# Patient Record
Sex: Male | Born: 1945
Health system: Southern US, Community
[De-identification: ages and names within clinical notes are randomized; demographics above are authoritative.]

## PROBLEM LIST (undated history)

## (undated) DIAGNOSIS — I350 Nonrheumatic aortic (valve) stenosis: Secondary | ICD-10-CM

## (undated) DIAGNOSIS — I639 Cerebral infarction, unspecified: Secondary | ICD-10-CM

## (undated) DIAGNOSIS — K4090 Unilateral inguinal hernia, without obstruction or gangrene, not specified as recurrent: Secondary | ICD-10-CM

## (undated) DIAGNOSIS — I4821 Permanent atrial fibrillation: Secondary | ICD-10-CM

## (undated) DIAGNOSIS — K5730 Diverticulosis of large intestine without perforation or abscess without bleeding: Secondary | ICD-10-CM

## (undated) DIAGNOSIS — I6529 Occlusion and stenosis of unspecified carotid artery: Secondary | ICD-10-CM

## (undated) DIAGNOSIS — E785 Hyperlipidemia, unspecified: Secondary | ICD-10-CM

## (undated) DIAGNOSIS — C61 Malignant neoplasm of prostate: Secondary | ICD-10-CM

## (undated) DIAGNOSIS — F17200 Nicotine dependence, unspecified, uncomplicated: Secondary | ICD-10-CM

## (undated) DIAGNOSIS — I739 Peripheral vascular disease, unspecified: Secondary | ICD-10-CM

## (undated) DIAGNOSIS — C4490 Unspecified malignant neoplasm of skin, unspecified: Secondary | ICD-10-CM

## (undated) DIAGNOSIS — R972 Elevated prostate specific antigen [PSA]: Secondary | ICD-10-CM

## (undated) DIAGNOSIS — I10 Essential (primary) hypertension: Secondary | ICD-10-CM

## (undated) DIAGNOSIS — J189 Pneumonia, unspecified organism: Secondary | ICD-10-CM

## (undated) HISTORY — DX: Occlusion and stenosis of unspecified carotid artery: I65.29

## (undated) HISTORY — DX: Essential (primary) hypertension: I10

## (undated) HISTORY — DX: Unspecified malignant neoplasm of skin, unspecified: C44.90

## (undated) HISTORY — DX: Permanent atrial fibrillation: I48.21

## (undated) HISTORY — DX: Diverticulosis of large intestine without perforation or abscess without bleeding: K57.30

## (undated) HISTORY — PX: TONSILLECTOMY AND ADENOIDECTOMY: SUR1326

## (undated) HISTORY — DX: Unilateral inguinal hernia, without obstruction or gangrene, not specified as recurrent: K40.90

## (undated) HISTORY — DX: Elevated prostate specific antigen (PSA): R97.20

## (undated) HISTORY — DX: Nonrheumatic aortic (valve) stenosis: I35.0

## (undated) HISTORY — DX: Nicotine dependence, unspecified, uncomplicated: F17.200

## (undated) HISTORY — DX: Hyperlipidemia, unspecified: E78.5

## (undated) HISTORY — DX: Malignant neoplasm of prostate: C61

## (undated) HISTORY — DX: Peripheral vascular disease, unspecified: I73.9

---

## 2004-06-22 ENCOUNTER — Ambulatory Visit: Payer: Self-pay | Admitting: Internal Medicine

## 2004-07-20 ENCOUNTER — Ambulatory Visit: Payer: Self-pay | Admitting: Internal Medicine

## 2004-08-16 ENCOUNTER — Ambulatory Visit: Payer: Self-pay | Admitting: Internal Medicine

## 2004-09-14 ENCOUNTER — Ambulatory Visit: Payer: Self-pay | Admitting: Internal Medicine

## 2004-10-13 ENCOUNTER — Ambulatory Visit: Payer: Self-pay | Admitting: Internal Medicine

## 2004-11-09 ENCOUNTER — Ambulatory Visit: Payer: Self-pay | Admitting: Internal Medicine

## 2004-12-08 ENCOUNTER — Ambulatory Visit: Payer: Self-pay | Admitting: Internal Medicine

## 2005-01-05 ENCOUNTER — Ambulatory Visit: Payer: Self-pay | Admitting: Internal Medicine

## 2005-02-03 ENCOUNTER — Ambulatory Visit: Payer: Self-pay | Admitting: Internal Medicine

## 2005-03-04 ENCOUNTER — Ambulatory Visit: Payer: Self-pay | Admitting: Internal Medicine

## 2005-03-31 ENCOUNTER — Ambulatory Visit: Payer: Self-pay | Admitting: Internal Medicine

## 2005-04-29 ENCOUNTER — Ambulatory Visit (HOSPITAL_COMMUNITY): Admission: RE | Admit: 2005-04-29 | Discharge: 2005-04-29 | Payer: Self-pay | Admitting: Cardiology

## 2005-05-06 ENCOUNTER — Ambulatory Visit: Payer: Self-pay | Admitting: Internal Medicine

## 2005-06-02 ENCOUNTER — Ambulatory Visit: Payer: Self-pay | Admitting: Internal Medicine

## 2005-06-24 ENCOUNTER — Ambulatory Visit (HOSPITAL_COMMUNITY): Admission: RE | Admit: 2005-06-24 | Discharge: 2005-06-24 | Payer: Self-pay | Admitting: Cardiology

## 2005-07-01 ENCOUNTER — Ambulatory Visit: Payer: Self-pay | Admitting: Internal Medicine

## 2005-07-29 ENCOUNTER — Ambulatory Visit: Payer: Self-pay | Admitting: Internal Medicine

## 2005-08-05 ENCOUNTER — Ambulatory Visit (HOSPITAL_COMMUNITY): Admission: RE | Admit: 2005-08-05 | Discharge: 2005-08-05 | Payer: Self-pay | Admitting: Cardiology

## 2005-08-05 HISTORY — PX: CARDIOVERSION: SHX1299

## 2005-08-05 HISTORY — PX: OTHER SURGICAL HISTORY: SHX169

## 2005-08-26 ENCOUNTER — Ambulatory Visit: Payer: Self-pay | Admitting: Internal Medicine

## 2005-09-23 ENCOUNTER — Ambulatory Visit: Payer: Self-pay | Admitting: Internal Medicine

## 2005-10-21 ENCOUNTER — Ambulatory Visit: Payer: Self-pay | Admitting: Internal Medicine

## 2005-11-18 ENCOUNTER — Ambulatory Visit: Payer: Self-pay | Admitting: Internal Medicine

## 2005-12-15 ENCOUNTER — Ambulatory Visit: Payer: Self-pay | Admitting: Internal Medicine

## 2006-01-12 ENCOUNTER — Ambulatory Visit: Payer: Self-pay | Admitting: Internal Medicine

## 2006-02-09 ENCOUNTER — Ambulatory Visit: Payer: Self-pay | Admitting: Internal Medicine

## 2006-03-17 ENCOUNTER — Ambulatory Visit: Payer: Self-pay | Admitting: Internal Medicine

## 2006-05-12 ENCOUNTER — Ambulatory Visit: Payer: Self-pay | Admitting: Internal Medicine

## 2006-06-08 ENCOUNTER — Ambulatory Visit: Payer: Self-pay | Admitting: Internal Medicine

## 2006-07-06 ENCOUNTER — Ambulatory Visit: Payer: Self-pay | Admitting: Internal Medicine

## 2006-08-03 ENCOUNTER — Ambulatory Visit: Payer: Self-pay | Admitting: Internal Medicine

## 2006-08-31 ENCOUNTER — Ambulatory Visit: Payer: Self-pay | Admitting: Internal Medicine

## 2006-10-06 ENCOUNTER — Ambulatory Visit: Payer: Self-pay | Admitting: Internal Medicine

## 2006-12-07 ENCOUNTER — Ambulatory Visit: Payer: Self-pay | Admitting: Internal Medicine

## 2007-01-04 ENCOUNTER — Ambulatory Visit: Payer: Self-pay | Admitting: Internal Medicine

## 2007-02-01 ENCOUNTER — Ambulatory Visit: Payer: Self-pay | Admitting: Internal Medicine

## 2008-05-30 DIAGNOSIS — K4090 Unilateral inguinal hernia, without obstruction or gangrene, not specified as recurrent: Secondary | ICD-10-CM

## 2008-05-30 HISTORY — PX: COLONOSCOPY: SHX174

## 2008-05-30 HISTORY — DX: Unilateral inguinal hernia, without obstruction or gangrene, not specified as recurrent: K40.90

## 2010-10-12 NOTE — Assessment & Plan Note (Signed)
[facility]                             PULMONARY OFFICE NOTE   AZEEZ, DUNKER                       MRN:          [id number]  DATE:02/01/2007                            DOB:          [birth year]    PROBLEM:  This gentleman had previously been followed at the old office  for a long-standing history of insect venom hypersensitivity.  He is a  former smoker who has been on insect venom desensitization now for many  years with no problems.  [place] has had no stings  in at least five years.  He does mow lawns.  He does not routinely  recognize asthma or seasonal rhinitis complaints.  He is still smoking  now, 5-6 cigarettes a day, and has been treated for atrial fibrillation.  If he is actively working outdoors in a field he may develop some nasal  congestion and realizes he would do better if he just wore a mask.   MEDICATIONS:  1. Hymenoptera vaccination.  2. Warfarin.  3. Cartia XT 300 mg.   DRUG INTOLERANCE:  FURADANTIN.   OBJECTIVE:  Weight 182 pounds, blood pressure 140/60, pulse 82, room air  saturation 92%.  HEART:  Heart sounds today are regular to my exam without murmur or  gallop.  LUNGS:  Lung fields are clear.  There is odor of tobacco.  No  significant nasal congestion.  No cough.   IMPRESSION:  1. Insect hypersensitivity with no recent problems.  He has been on      venom vaccination a long time and it is appropriate now as      discussed carefully with him, to let him stop.  2. Minimal allergic rhinitis.  3. Tobacco abuse.  4. Atrial fibrillation, currently either very well-controlled or in      sinus rhythm.   PLAN:  1. Epi-Pen with discussion.  2. We are stopping his venom vaccine.  3. Smoking cessation.  4. Schedule return here p.r.n.     Clinton D. Maple Hudson, MD, Tonny Bollman, FACP  Electronically Signed   CDY/MedQ  DD: 02/04/2007  DT: 02/05/2007  Job #: [id number]   cc:   Candyce Churn, M.D.  Armanda Magic, M.D.

## 2010-10-15 NOTE — Cardiovascular Report (Signed)
NAME:  Kyle Kelley, BURBANO NO.:  [id number]   MEDICAL RECORD NO.:  [id number]          PATIENT TYPE:  OIB   LOCATION:  2899                         FACILITY:  [facility]   PHYSICIAN:  Armanda Magic, M.D.     DATE OF BIRTH:  [birth year]   DATE OF PROCEDURE:  DATE OF DISCHARGE:                              CARDIAC CATHETERIZATION   REFERRING PHYSICIAN:  Candyce Churn, M.D.   PROCEDURES:  1.  Left heart catheterization.  2.  Coronary angiography.  3.  Left ventriculography.   OPERATOR:  Armanda Magic, M.D.   INDICATIONS:  Atrial fibrillation, abnormal Cardiolite.   COMPLICATIONS:  None.   IV access via the right femoral artery, 6-French sheath.   IV contrast a 110 cc.   This is a 65 year old white male with a history of atrial fibrillation in  the past who had converted back to a sinus rhythm but now was found to be  back in Afib.  He underwent stress Cardiolite study to rule out inducible  ischemia which showed a partially reversible inferior defect and now  presents for cardiac catheterization.   The patient was brought to the cardiac catheterization laboratory in a  fasting non-sedated state.  Informed consent was obtained.  The patient was  connected to continuous heart rate and pulse oximetry monitoring and blood  pressure monitoring.  The right groin was prepped and draped in sterile  fashion.  Xylocaine 1% was used for local anesthesia. Using modified  Seldinger technique, a 6-French sheath was placed in the right femoral  artery.  Under fluoroscopic guidance, a 6-French JL-4 catheter was placed in  the left coronary artery.  Multiple cine films were taken at 30 degree RAO,  40 degree LAO views.  This catheter was then exchanged out for a 6-French JR-  4 catheter which could not successfully engage the right coronary ostium.  The catheter was exchanged out over a guidewire for 6-French no torque right  coronary artery catheter which again could not  successfully engage the right  coronary ostium.  The catheter was exchanged over guidewire for a 6-French  Williams right catheter which successfully engaged the right coronary  ostium. Multiple cine films were taken at 30 degree RAO, 40 degree LAO  views.  This catheter was exchanged over a guidewire for 6-French angled  pigtail catheter which was placed under fluoroscopic guidance in the left  ventricular cavity.  Left ventriculography was performed in 30 degree RAO  view, using a total of 30 cc of contrast at 15 cc/sec.  The catheter was  then pulled back across the aortic valve with no significant gradient noted.  At the end of procedure, all catheters and sheaths were removed.  Manual  compression was performed till adequate hemostasis was obtained.  The  patient transferred back to the room stable condition.   RESULTS:  1.  The left main coronary artery is widely patent and bifurcates in the      left anterior descending artery and left circumflex artery.  The left      anterior descending artery is widely patent  throughout its course.  The      apex giving rise to one diagonal branch.  The first diagonal is widely      patent throughout its course, bifurcating distally into two daughter      branches.  2.  The left circumflex is widely patent throughout its course giving rise      to a first obtuse marginal branch which is widely patent.  It then gives      rise to a second obtuse marginal branch which bifurcates into two      daughter branches both of which are widely patent.  The ongoing      circumflex is widely patent.  3.  The right coronary artery is widely patent throughout its course,      bifurcating distally at a posterior descending artery and posterolateral      artery.   LEFT VENTRICULOGRAPHY:  1.  Shows normal LV systolic function, EF greater than 60%.  2.  Aortic pressure 123/79-mmHg.  3.  LV pressure 124/90-mmHg.   ASSESSMENT:  1.  Normal coronary arteries.   2.  Normal LV function.  3.  Atrial fibrillation.   PLAN:  1.  Discharge to home after IV fluid and bedrest.  2.  Restart Coumadin tonight.  3.  Follow up with me in two weeks.      Armanda Magic, M.D.  Electronically Signed     TT/MEDQ  D:  04/29/2005  T:  04/29/2005  Job:  [id number]   cc:   Candyce Churn, M.D.  Fax: [phone]

## 2010-10-15 NOTE — Op Note (Signed)
NAME:  Kyle Kelley, Kyle Kelley NO.:  [id number]   MEDICAL RECORD NO.:  [id number]          PATIENT TYPE:  OIB   LOCATION:  2899                         FACILITY:  [facility]   PHYSICIAN:  Armanda Magic, M.D.     DATE OF BIRTH:  [birth year]   DATE OF PROCEDURE:  06/24/2005  DATE OF DISCHARGE:                                 OPERATIVE REPORT   REFERRING PHYSICIAN:  Candyce Churn, M.D.   PROCEDURE PERFORMED:  Direct current cardioversion.   OPERATOR:  Armanda Magic, M.D.   INDICATIONS FOR PROCEDURE:  Atrial fibrillation.   COMPLICATIONS:  None.   This is a 65 year old white male with a history of atrial fibrillation in  the past which spontaneously converted on its own but now is back in atrial  fibrillation.  He underwent cardiac catheterization for an abnormal  Cardiolite which showed normal coronary arteries and normal LV function and  he now presents for direct current cardioversion after therapeutic INR of  greater than 2 for four consecutive weeks.   DESCRIPTION OF PROCEDURE:  The patient was brought to the day hospital in a  fasting nonsedated state.  Informed consent was obtained.  The patient was  connected to the continuous heart rate and pulse oximetry monitoring and  intermittent blood pressure monitoring.  After adequate anesthesia was  obtained, a synchronized 100 joule shock was delivered with successfully  converted the patient to normal sinus rhythm.  The patient was in normal  sinus rhythm for a few minutes and then converted back to atrial  fibrillation with controlled ventricular response.  The patient tolerated  the procedure well. He was later discharged to home after fully awake.   ASSESSMENT:  1.  Atrial fibrillation.  2.  Systemic anticoagulation with therapeutic INR greater than 2.0 x 4      weeks.  3.  Hypertension.  4.  Dyslipidemia.  5.  Normal coronary arteries by catheterization.   PLAN:  Discharged to home after fully awake and  follow up with me in one  week to discuss drug loading with either Rythmol or Sotalol.  He will  continue with chronic Coumadin dose.      Armanda Magic, M.D.  Electronically Signed    TT/MEDQ  D:  06/24/2005  T:  06/24/2005  Job:  [id number]

## 2010-10-15 NOTE — Op Note (Signed)
NAME:  Kyle Kelley, BUSBEE NO.:  [id number]   MEDICAL RECORD NO.:  [id number]          PATIENT TYPE:  OIB   LOCATION:  2899                         FACILITY:  [facility]   PHYSICIAN:  Armanda Magic, M.D.     DATE OF BIRTH:  07/05/1945   DATE OF PROCEDURE:  08/05/2005  DATE OF DISCHARGE:                                 OPERATIVE REPORT   REFERRING PHYSICIAN:  Candyce Churn, M.D.   PROCEDURE:  Direct current cardioversion.   OPERATOR:  Armanda Magic, M.D.   INDICATIONS FOR PROCEDURE:  Atrial fibrillation.   COMPLICATIONS:  None.   IV MEDICATIONS:  Diprivan.   This is a very pleasant 65 year old white male with a history of atrial  fibrillation status post cardioversion in the past which did not hold sinus  rhythm.  He is status post Rythmol loading and presents for cardioversion.   The patient was brought to the hospital in a fasting, nonsedated state.  Informed consent was obtained.  The patient was connected to continuous  heart rate, pulse oximeter monitoring, intermittent blood pressure  monitoring.  Anesthesia delivered intravenous medication.  After adequate  anesthesia was obtained, a 100 joule synchronized shock was delivered which  essentially converted the patient normal sinus rhythm with blocked PAC.  The  patient tolerated the procedure well without complications.   ASSESSMENT:  1.  Atrial fibrillation status post Rythmol loading.  2.  Successful cardioversion to normal sinus rhythm.   PLAN:  Discharge to home when fully awake.  Follow up with me in two weeks.      Armanda Magic, M.D.  Electronically Signed     TT/MEDQ  D:  08/05/2005  T:  08/06/2005  Job:  [id number]   cc:   Candyce Churn, M.D.  Fax: [phone]

## 2012-02-28 DIAGNOSIS — C4490 Unspecified malignant neoplasm of skin, unspecified: Secondary | ICD-10-CM

## 2012-02-28 HISTORY — DX: Unspecified malignant neoplasm of skin, unspecified: C44.90

## 2012-07-03 DIAGNOSIS — Z7901 Long term (current) use of anticoagulants: Secondary | ICD-10-CM | POA: Diagnosis not present

## 2012-07-03 DIAGNOSIS — I4891 Unspecified atrial fibrillation: Secondary | ICD-10-CM | POA: Diagnosis not present

## 2012-07-19 DIAGNOSIS — R972 Elevated prostate specific antigen [PSA]: Secondary | ICD-10-CM | POA: Diagnosis not present

## 2012-07-19 DIAGNOSIS — I4891 Unspecified atrial fibrillation: Secondary | ICD-10-CM | POA: Diagnosis not present

## 2012-07-19 DIAGNOSIS — Z79899 Other long term (current) drug therapy: Secondary | ICD-10-CM | POA: Diagnosis not present

## 2012-07-28 DIAGNOSIS — C61 Malignant neoplasm of prostate: Secondary | ICD-10-CM

## 2012-07-28 HISTORY — DX: Malignant neoplasm of prostate: C61

## 2012-09-06 DIAGNOSIS — R972 Elevated prostate specific antigen [PSA]: Secondary | ICD-10-CM | POA: Diagnosis not present

## 2012-09-06 DIAGNOSIS — IMO0002 Reserved for concepts with insufficient information to code with codable children: Secondary | ICD-10-CM | POA: Diagnosis not present

## 2012-09-06 HISTORY — PX: PROSTATE BIOPSY: SHX241

## 2012-09-13 DIAGNOSIS — C61 Malignant neoplasm of prostate: Secondary | ICD-10-CM | POA: Diagnosis not present

## 2012-09-20 DIAGNOSIS — L821 Other seborrheic keratosis: Secondary | ICD-10-CM | POA: Diagnosis not present

## 2012-09-20 DIAGNOSIS — D239 Other benign neoplasm of skin, unspecified: Secondary | ICD-10-CM | POA: Diagnosis not present

## 2012-09-20 DIAGNOSIS — Z85828 Personal history of other malignant neoplasm of skin: Secondary | ICD-10-CM | POA: Diagnosis not present

## 2012-09-21 ENCOUNTER — Other Ambulatory Visit: Payer: Self-pay | Admitting: Urology

## 2012-09-25 DIAGNOSIS — Z7901 Long term (current) use of anticoagulants: Secondary | ICD-10-CM | POA: Diagnosis not present

## 2012-09-25 DIAGNOSIS — I4891 Unspecified atrial fibrillation: Secondary | ICD-10-CM | POA: Diagnosis not present

## 2012-10-01 DIAGNOSIS — C61 Malignant neoplasm of prostate: Secondary | ICD-10-CM | POA: Diagnosis not present

## 2012-10-08 DIAGNOSIS — R279 Unspecified lack of coordination: Secondary | ICD-10-CM | POA: Diagnosis not present

## 2012-10-08 DIAGNOSIS — C61 Malignant neoplasm of prostate: Secondary | ICD-10-CM | POA: Diagnosis not present

## 2012-10-08 DIAGNOSIS — M6281 Muscle weakness (generalized): Secondary | ICD-10-CM | POA: Diagnosis not present

## 2012-10-09 DIAGNOSIS — I4891 Unspecified atrial fibrillation: Secondary | ICD-10-CM | POA: Diagnosis not present

## 2012-10-09 DIAGNOSIS — Z7901 Long term (current) use of anticoagulants: Secondary | ICD-10-CM | POA: Diagnosis not present

## 2012-10-16 DIAGNOSIS — I4891 Unspecified atrial fibrillation: Secondary | ICD-10-CM | POA: Diagnosis not present

## 2012-10-16 DIAGNOSIS — Z7901 Long term (current) use of anticoagulants: Secondary | ICD-10-CM | POA: Diagnosis not present

## 2012-10-17 ENCOUNTER — Encounter (HOSPITAL_COMMUNITY): Payer: Self-pay | Admitting: Pharmacy Technician

## 2012-10-17 DIAGNOSIS — M6281 Muscle weakness (generalized): Secondary | ICD-10-CM | POA: Diagnosis not present

## 2012-10-17 DIAGNOSIS — R279 Unspecified lack of coordination: Secondary | ICD-10-CM | POA: Diagnosis not present

## 2012-10-17 DIAGNOSIS — C61 Malignant neoplasm of prostate: Secondary | ICD-10-CM | POA: Diagnosis not present

## 2012-10-23 NOTE — Patient Instructions (Signed)
OLEG OLESON  10/23/2012   Your procedure is scheduled on:  10/31/12    Report to [facility] at  0630  AM.  Call this number if you have problems the morning of surgery: [phone]   Remember:   Do not eat food or drink liquids after midnight.   Take these medicines the morning of surgery with A SIP OF WATER:    Do not wear jewelry,   Do not wear lotions, powders, or perfumes.  . Men may shave face and neck.  Do not bring valuables to the hospital.  Contacts, dentures or bridgework may not be worn into surgery.  Leave suitcase in the car. After surgery it may be brought to your room.  For patients admitted to the hospital, checkout time is 11:00 AM the day of  discharge.     SEE CHG INSTRUCTION SHEET    Please read over the following fact sheets that you were given: MRSA Information, coughing and deep breathing exercises, leg exercises, Blood Transfusion Fact Sheet                Failure to comply with these instructions may result in cancellation of your surgery.                Patient Signature ____________________________              Nurse Signature _____________________________

## 2012-10-24 ENCOUNTER — Encounter (HOSPITAL_COMMUNITY): Payer: Self-pay

## 2012-10-24 ENCOUNTER — Encounter (HOSPITAL_COMMUNITY)
Admission: RE | Admit: 2012-10-24 | Discharge: 2012-10-24 | Disposition: A | Discharge status: Home / Self Care | Payer: Medicare Other | Source: Ambulatory Visit | Attending: Urology | Admitting: Urology

## 2012-10-24 ENCOUNTER — Ambulatory Visit (HOSPITAL_COMMUNITY)
Admission: RE | Admit: 2012-10-24 | Discharge: 2012-10-24 | Disposition: A | Discharge status: Home / Self Care | Payer: Medicare Other | Source: Ambulatory Visit | Attending: Urology | Admitting: Urology

## 2012-10-24 DIAGNOSIS — R0602 Shortness of breath: Secondary | ICD-10-CM | POA: Diagnosis not present

## 2012-10-24 DIAGNOSIS — Z01818 Encounter for other preprocedural examination: Secondary | ICD-10-CM | POA: Insufficient documentation

## 2012-10-24 DIAGNOSIS — R05 Cough: Secondary | ICD-10-CM | POA: Insufficient documentation

## 2012-10-24 DIAGNOSIS — Z0183 Encounter for blood typing: Secondary | ICD-10-CM | POA: Diagnosis not present

## 2012-10-24 DIAGNOSIS — R059 Cough, unspecified: Secondary | ICD-10-CM | POA: Insufficient documentation

## 2012-10-24 DIAGNOSIS — Z01812 Encounter for preprocedural laboratory examination: Secondary | ICD-10-CM | POA: Diagnosis not present

## 2012-10-24 DIAGNOSIS — I10 Essential (primary) hypertension: Secondary | ICD-10-CM | POA: Insufficient documentation

## 2012-10-24 HISTORY — DX: Pneumonia, unspecified organism: J18.9

## 2012-10-24 LAB — BASIC METABOLIC PANEL
BUN: 12 mg/dL (ref 6–23)
Calcium: 9.8 mg/dL (ref 8.4–10.5)
Creatinine, Ser: 0.93 mg/dL (ref 0.50–1.35)
GFR calc Af Amer: >90 mL/min (ref >90)
GFR calc non Af Amer: 86 mL/min — ABNORMAL LOW (ref >90)

## 2012-10-24 LAB — CBC
HCT: 48 % (ref 39.0–52.0)
MCHC: 34.2 g/dL (ref 30.0–36.0)
MCV: 91.4 fL (ref 78.0–100.0)
RDW: 12.8 % (ref 11.5–15.5)

## 2012-10-24 NOTE — Progress Notes (Signed)
ECHO 05/10/12 on chart ECHO 2006 on chasrt  Stress Test 2006 on chart  Cath 2006 on chart  Office visit note- 05/12/11 on chart  Office visit note 05/10/12 on carht  Cardioversion 2007 on chart

## 2012-10-24 NOTE — Progress Notes (Signed)
BMP sent via EPIC to Dr Isabel Caprice.

## 2012-10-31 ENCOUNTER — Encounter (HOSPITAL_COMMUNITY): Payer: Self-pay | Admitting: Anesthesiology

## 2012-10-31 ENCOUNTER — Inpatient Hospital Stay (HOSPITAL_COMMUNITY)
Admission: RE | Admit: 2012-10-31 | Discharge: 2012-11-01 | DRG: 708 | Disposition: A | Discharge status: Home / Self Care | Payer: Medicare Other | Source: Ambulatory Visit | Attending: Urology | Admitting: Urology

## 2012-10-31 ENCOUNTER — Ambulatory Visit (HOSPITAL_COMMUNITY): Payer: Medicare Other | Admitting: Anesthesiology

## 2012-10-31 ENCOUNTER — Encounter (HOSPITAL_COMMUNITY): Payer: Self-pay | Admitting: *Deleted

## 2012-10-31 ENCOUNTER — Encounter (HOSPITAL_COMMUNITY)
Admission: RE | Disposition: A | Discharge status: Home / Self Care | Payer: Self-pay | Source: Ambulatory Visit | Attending: Urology

## 2012-10-31 DIAGNOSIS — I10 Essential (primary) hypertension: Secondary | ICD-10-CM | POA: Diagnosis present

## 2012-10-31 DIAGNOSIS — Z7901 Long term (current) use of anticoagulants: Secondary | ICD-10-CM | POA: Diagnosis not present

## 2012-10-31 DIAGNOSIS — I4891 Unspecified atrial fibrillation: Secondary | ICD-10-CM | POA: Diagnosis present

## 2012-10-31 DIAGNOSIS — E7800 Pure hypercholesterolemia, unspecified: Secondary | ICD-10-CM | POA: Diagnosis present

## 2012-10-31 DIAGNOSIS — N529 Male erectile dysfunction, unspecified: Secondary | ICD-10-CM | POA: Diagnosis present

## 2012-10-31 DIAGNOSIS — F17200 Nicotine dependence, unspecified, uncomplicated: Secondary | ICD-10-CM | POA: Diagnosis present

## 2012-10-31 DIAGNOSIS — R351 Nocturia: Secondary | ICD-10-CM | POA: Diagnosis present

## 2012-10-31 DIAGNOSIS — C61 Malignant neoplasm of prostate: Secondary | ICD-10-CM | POA: Diagnosis not present

## 2012-10-31 DIAGNOSIS — R350 Frequency of micturition: Secondary | ICD-10-CM | POA: Diagnosis present

## 2012-10-31 HISTORY — PX: ROBOT ASSISTED LAPAROSCOPIC RADICAL PROSTATECTOMY: SHX5141

## 2012-10-31 LAB — PROTIME-INR
INR: 1.1 (ref 0.00–1.49)
Prothrombin Time: 14.1 seconds (ref 11.6–15.2)

## 2012-10-31 LAB — HEMOGLOBIN AND HEMATOCRIT, BLOOD: Hemoglobin: 15.6 g/dL (ref 13.0–17.0)

## 2012-10-31 LAB — TYPE AND SCREEN: Antibody Screen: NEGATIVE

## 2012-10-31 SURGERY — ROBOTIC ASSISTED LAPAROSCOPIC RADICAL PROSTATECTOMY
Anesthesia: General | Wound class: Clean Contaminated

## 2012-10-31 MED ORDER — LACTATED RINGERS IV SOLN: INTRAVENOUS | Status: DC

## 2012-10-31 MED ORDER — BUPIVACAINE-EPINEPHRINE PF 0.25-1:200000 % IJ SOLN
INTRAMUSCULAR | Status: AC
  Filled 2012-10-31: qty 30

## 2012-10-31 MED ORDER — CIPROFLOXACIN HCL 500 MG PO TABS: 500.0000 mg | ORAL_TABLET | Freq: Two times a day (BID) | ORAL | Status: DC

## 2012-10-31 MED ORDER — HYDROCODONE-ACETAMINOPHEN 5-325 MG PO TABS
1.0000 | ORAL_TABLET | ORAL | Status: DC | PRN
  Filled 2012-10-31: qty 2

## 2012-10-31 MED ORDER — HYDROMORPHONE HCL PF 1 MG/ML IJ SOLN
0.2500 mg | INTRAMUSCULAR | Status: DC | PRN
  Administered 2012-10-31 (×2): 0.5 mg via INTRAVENOUS

## 2012-10-31 MED ORDER — ACETAMINOPHEN 10 MG/ML IV SOLN
1000.0000 mg | Freq: Four times a day (QID) | INTRAVENOUS | Status: DC
  Filled 2012-10-31 (×3): qty 100

## 2012-10-31 MED ORDER — CEFAZOLIN SODIUM-DEXTROSE 2-3 GM-% IV SOLR
INTRAVENOUS | Status: AC
  Filled 2012-10-31: qty 50

## 2012-10-31 MED ORDER — NEOSTIGMINE METHYLSULFATE 1 MG/ML IJ SOLN
INTRAMUSCULAR | Status: DC | PRN
  Administered 2012-10-31: 4 mg via INTRAVENOUS

## 2012-10-31 MED ORDER — ACETAMINOPHEN 10 MG/ML IV SOLN
1000.0000 mg | Freq: Four times a day (QID) | INTRAVENOUS | Status: AC
  Administered 2012-10-31 – 2012-11-01 (×4): 1000 mg via INTRAVENOUS
  Filled 2012-10-31 (×4): qty 100

## 2012-10-31 MED ORDER — METOPROLOL SUCCINATE ER 25 MG PO TB24
25.0000 mg | ORAL_TABLET | Freq: Every day | ORAL | Status: DC
Start: 2012-11-01 — End: 2012-11-01
  Administered 2012-11-01: 25 mg via ORAL
  Filled 2012-10-31 (×2): qty 1

## 2012-10-31 MED ORDER — HYDROMORPHONE HCL PF 1 MG/ML IJ SOLN
INTRAMUSCULAR | Status: DC | PRN
  Administered 2012-10-31: 1 mg via INTRAVENOUS

## 2012-10-31 MED ORDER — PROPOFOL 10 MG/ML IV BOLUS
INTRAVENOUS | Status: DC | PRN
  Administered 2012-10-31: 140 mg via INTRAVENOUS

## 2012-10-31 MED ORDER — ROCURONIUM BROMIDE 100 MG/10ML IV SOLN
INTRAVENOUS | Status: DC | PRN
  Administered 2012-10-31: 50 mg via INTRAVENOUS
  Administered 2012-10-31: 20 mg via INTRAVENOUS
  Administered 2012-10-31: 5 mg via INTRAVENOUS

## 2012-10-31 MED ORDER — MIDAZOLAM HCL 5 MG/5ML IJ SOLN
INTRAMUSCULAR | Status: DC | PRN
  Administered 2012-10-31: 2 mg via INTRAVENOUS

## 2012-10-31 MED ORDER — ONDANSETRON HCL 4 MG/2ML IJ SOLN
INTRAMUSCULAR | Status: DC | PRN
  Administered 2012-10-31: 4 mg via INTRAVENOUS

## 2012-10-31 MED ORDER — GLYCOPYRROLATE 0.2 MG/ML IJ SOLN
INTRAMUSCULAR | Status: DC | PRN
  Administered 2012-10-31: .7 mg via INTRAVENOUS

## 2012-10-31 MED ORDER — PNEUMOCOCCAL VAC POLYVALENT 25 MCG/0.5ML IJ INJ
0.5000 mL | INJECTION | INTRAMUSCULAR | Status: AC
  Administered 2012-11-01: 0.5 mL via INTRAMUSCULAR
  Filled 2012-10-31 (×2): qty 0.5

## 2012-10-31 MED ORDER — INDIGOTINDISULFONATE SODIUM 8 MG/ML IJ SOLN
INTRAMUSCULAR | Status: DC | PRN
  Administered 2012-10-31: 40 mg via INTRAVENOUS

## 2012-10-31 MED ORDER — HYDROMORPHONE HCL PF 1 MG/ML IJ SOLN
INTRAMUSCULAR | Status: AC
  Filled 2012-10-31: qty 1

## 2012-10-31 MED ORDER — WARFARIN - PHYSICIAN DOSING INPATIENT: Freq: Every day | Status: DC

## 2012-10-31 MED ORDER — LACTATED RINGERS IV SOLN
INTRAVENOUS | Status: DC | PRN
  Administered 2012-10-31: 09:00:00

## 2012-10-31 MED ORDER — INDIGOTINDISULFONATE SODIUM 8 MG/ML IJ SOLN
INTRAMUSCULAR | Status: AC
  Filled 2012-10-31: qty 10

## 2012-10-31 MED ORDER — DILTIAZEM HCL ER BEADS 300 MG PO CP24
300.0000 mg | ORAL_CAPSULE | Freq: Every day | ORAL | Status: DC
  Administered 2012-11-01: 300 mg via ORAL
  Filled 2012-10-31 (×2): qty 1

## 2012-10-31 MED ORDER — BUPIVACAINE-EPINEPHRINE 0.25% -1:200000 IJ SOLN
INTRAMUSCULAR | Status: DC | PRN
  Administered 2012-10-31: 30 mL

## 2012-10-31 MED ORDER — PROMETHAZINE HCL 25 MG/ML IJ SOLN: 6.2500 mg | INTRAMUSCULAR | Status: DC | PRN

## 2012-10-31 MED ORDER — DEXTROSE-NACL 5-0.45 % IV SOLN
INTRAVENOUS | Status: DC
  Administered 2012-10-31 (×2): via INTRAVENOUS

## 2012-10-31 MED ORDER — SODIUM CHLORIDE 0.9 % IR SOLN
Status: DC | PRN
  Administered 2012-10-31: 1000 mL via INTRAVESICAL

## 2012-10-31 MED ORDER — CEFAZOLIN SODIUM-DEXTROSE 2-3 GM-% IV SOLR
2.0000 g | INTRAVENOUS | Status: AC
  Administered 2012-10-31: 2 g via INTRAVENOUS

## 2012-10-31 MED ORDER — FENTANYL CITRATE 0.05 MG/ML IJ SOLN
INTRAMUSCULAR | Status: DC | PRN
  Administered 2012-10-31: 50 ug via INTRAVENOUS
  Administered 2012-10-31: 100 ug via INTRAVENOUS
  Administered 2012-10-31 (×2): 50 ug via INTRAVENOUS

## 2012-10-31 MED ORDER — LIDOCAINE HCL (CARDIAC) 20 MG/ML IV SOLN
INTRAVENOUS | Status: DC | PRN
  Administered 2012-10-31: 80 mg via INTRAVENOUS

## 2012-10-31 MED ORDER — STERILE WATER FOR IRRIGATION IR SOLN
Status: DC | PRN
  Administered 2012-10-31: 1500 mL

## 2012-10-31 MED ORDER — BIOTENE DRY MOUTH MT LIQD
15.0000 mL | Freq: Two times a day (BID) | OROMUCOSAL | Status: DC
  Administered 2012-10-31 – 2012-11-01 (×3): 15 mL via OROMUCOSAL

## 2012-10-31 MED ORDER — LACTATED RINGERS IV SOLN
INTRAVENOUS | Status: DC | PRN
  Administered 2012-10-31: 08:00:00 via INTRAVENOUS

## 2012-10-31 MED ORDER — HYDROCODONE-ACETAMINOPHEN 5-325 MG PO TABS: 1.0000 | ORAL_TABLET | Freq: Four times a day (QID) | ORAL | Status: DC | PRN

## 2012-10-31 MED ORDER — MORPHINE SULFATE 2 MG/ML IJ SOLN: 2.0000 mg | INTRAMUSCULAR | Status: DC | PRN

## 2012-10-31 MED ORDER — HEPARIN SODIUM (PORCINE) 1000 UNIT/ML IJ SOLN
INTRAMUSCULAR | Status: AC
  Filled 2012-10-31: qty 1

## 2012-10-31 MED ORDER — MEPERIDINE HCL 50 MG/ML IJ SOLN: 6.2500 mg | INTRAMUSCULAR | Status: DC | PRN

## 2012-10-31 MED ORDER — WARFARIN SODIUM 5 MG PO TABS
5.0000 mg | ORAL_TABLET | Freq: Every day | ORAL | Status: DC
  Administered 2012-11-01: 5 mg via ORAL
  Filled 2012-10-31 (×2): qty 1

## 2012-10-31 MED ORDER — SODIUM CHLORIDE 0.9 % IV BOLUS (SEPSIS)
1000.0000 mL | Freq: Once | INTRAVENOUS | Status: AC
  Administered 2012-10-31: 1000 mL via INTRAVENOUS

## 2012-10-31 SURGICAL SUPPLY — 44 items
CANISTER SUCTION 2500CC (MISCELLANEOUS) ×2 IMPLANT
CATH FOLEY 2WAY SLVR 18FR 30CC (CATHETERS) ×2 IMPLANT
CATH ROBINSON RED A/P 16FR (CATHETERS) ×2 IMPLANT
CATH ROBINSON RED A/P 8FR (CATHETERS) ×2 IMPLANT
CATH TIEMANN FOLEY 18FR 5CC (CATHETERS) ×2 IMPLANT
CHLORAPREP W/TINT 26ML (MISCELLANEOUS) ×2 IMPLANT
CLIP LIGATING HEM O LOK PURPLE (MISCELLANEOUS) IMPLANT
CLOTH BEACON ORANGE TIMEOUT ST (SAFETY) ×2 IMPLANT
CORD HIGH FREQUENCY UNIPOLAR (ELECTROSURGICAL) ×2 IMPLANT
COVER SURGICAL LIGHT HANDLE (MISCELLANEOUS) ×2 IMPLANT
COVER TIP SHEARS 8 DVNC (MISCELLANEOUS) ×1 IMPLANT
COVER TIP SHEARS 8MM DA VINCI (MISCELLANEOUS) ×1
CUTTER ECHEON FLEX ENDO 45 340 (ENDOMECHANICALS) ×2 IMPLANT
DECANTER SPIKE VIAL GLASS SM (MISCELLANEOUS) ×1 IMPLANT
DRAPE SURG IRRIG POUCH 19X23 (DRAPES) ×2 IMPLANT
DRSG TEGADERM 2-3/8X2-3/4 SM (GAUZE/BANDAGES/DRESSINGS) ×8 IMPLANT
DRSG TEGADERM 4X4.75 (GAUZE/BANDAGES/DRESSINGS) ×4 IMPLANT
DRSG TEGADERM 6X8 (GAUZE/BANDAGES/DRESSINGS) ×5 IMPLANT
ELECT REM PT RETURN 9FT ADLT (ELECTROSURGICAL) ×2
ELECTRODE REM PT RTRN 9FT ADLT (ELECTROSURGICAL) ×1 IMPLANT
GAUZE SPONGE 2X2 8PLY STRL LF (GAUZE/BANDAGES/DRESSINGS) ×1 IMPLANT
GLOVE BIO SURGEON STRL SZ 6.5 (GLOVE) ×2 IMPLANT
GLOVE BIOGEL M STRL SZ7.5 (GLOVE) ×4 IMPLANT
GOWN PREVENTION PLUS XLARGE (GOWN DISPOSABLE) ×2 IMPLANT
GOWN STRL NON-REIN LRG LVL3 (GOWN DISPOSABLE) ×2 IMPLANT
GOWN STRL REIN XL XLG (GOWN DISPOSABLE) ×4 IMPLANT
HOLDER FOLEY CATH W/STRAP (MISCELLANEOUS) ×2 IMPLANT
IV LACTATED RINGERS 1000ML (IV SOLUTION) ×2 IMPLANT
KIT ACCESSORY DA VINCI DISP (KITS) ×1
KIT ACCESSORY DVNC DISP (KITS) ×1 IMPLANT
NDL SAFETY ECLIPSE 18X1.5 (NEEDLE) ×1 IMPLANT
NEEDLE HYPO 18GX1.5 SHARP (NEEDLE) ×2
PACK ROBOT UROLOGY CUSTOM (CUSTOM PROCEDURE TRAY) ×2 IMPLANT
RELOAD GREEN ECHELON 45 (STAPLE) ×2 IMPLANT
SEALER TISSUE G2 CVD JAW 45CM (ENDOMECHANICALS) ×1 IMPLANT
SET TUBE IRRIG SUCTION NO TIP (IRRIGATION / IRRIGATOR) ×2 IMPLANT
SOLUTION ELECTROLUBE (MISCELLANEOUS) ×2 IMPLANT
SPONGE GAUZE 2X2 STER 10/PKG (GAUZE/BANDAGES/DRESSINGS)
SUT VIC AB 2-0 SH 27 (SUTURE) ×2
SUT VIC AB 2-0 SH 27X BRD (SUTURE) ×1 IMPLANT
SUT VICRYL 0 UR6 27IN ABS (SUTURE) ×1 IMPLANT
SYR 27GX1/2 1ML LL SAFETY (SYRINGE) ×2 IMPLANT
TOWEL OR NON WOVEN STRL DISP B (DISPOSABLE) ×2 IMPLANT
WATER STERILE IRR 1500ML POUR (IV SOLUTION) ×4 IMPLANT

## 2012-10-31 NOTE — Care Management Note (Addendum)
    Page 1 of 1   11/01/2012     2:07:22 PM   CARE MANAGEMENT NOTE 11/01/2012  Patient:  KEISHAUN, HAZEL   Account Number:  [id number]  Date Initiated:  10/31/2012  Documentation initiated by:  Lanier Clam  Subjective/Objective Assessment:   ADMITTED W/PROSTATE CA.     Action/Plan:   FROM HOME W/SPOUSE.   Anticipated DC Date:  11/01/2012   Anticipated DC Plan:  HOME/SELF CARE      DC Planning Services  CM consult      Choice offered to / List presented to:             Status of service:  Completed, signed off Medicare Important Message given?   (If response is "NO", the following Medicare IM given date fields will be blank) Date Medicare IM given:   Date Additional Medicare IM given:    Discharge Disposition:  HOME/SELF CARE  Per UR Regulation:  Reviewed for med. necessity/level of care/duration of stay  If discussed at Long Length of Stay Meetings, dates discussed:    Comments:  11/01/12 Gee Habig RN,BSN NCM [phone] D/C HOME NO ORDERS OR NEEDS.  10/31/12 Evani Shrider RN,BSN NCM [phone] S/P LAP RADICAL PROSTATECTOMY.

## 2012-10-31 NOTE — Progress Notes (Signed)
Patient ID: ROSIE GOLSON, male   DOB: 1946/01/08, 67 y.o.   MRN: [id number] Post-op note  Subjective: The patient is doing well.  No complaints.  Denies N/V.  Has not ambulated yet.  Objective: Vital signs in last 24 hours: Temp:  [97.1 F (36.2 C)-98 F (36.7 C)] 97.7 F (36.5 C) (06/04 1243) Pulse Rate:  [71-81] 81 (06/04 1243) Resp:  [16-24] 18 (06/04 1243) BP: (107-139)/(55-97) 113/65 mmHg (06/04 1243) SpO2:  [100 %] 100 % (06/04 1243) Weight:  [78.835 kg (173 lb 12.8 oz)] 78.835 kg (173 lb 12.8 oz) (06/04 1243)  Intake/Output from previous day:   Intake/Output this shift: Total I/O In: 2000 [I.V.:1000; IV Piggyback:1000] Out: 220 [Urine:100; Drains:20; Blood:100]  Physical Exam:  General: Alert and oriented. Abdomen: Soft, Nondistended. Incisions: Clean and dry.  Lab Results:  Recent Labs  10/31/12 1134  HGB 15.6  HCT 46.9    Assessment/Plan: POD#0   1) Continue to monitor 2) Amb, IS, DVT prophy, pain control    LOS: 0 days   YARBROUGH,Thetis Schwimmer G. 10/31/2012, 3:15 PM

## 2012-10-31 NOTE — Anesthesia Preprocedure Evaluation (Addendum)
Anesthesia Evaluation  Patient identified by MRN, date of birth, ID band Patient awake    Reviewed: Allergy & Precautions, H&P , NPO status , Patient's Chart, lab work & pertinent test results  Airway Mallampati: II TM Distance: >3 FB Neck ROM: Full    Dental no notable dental hx.    Pulmonary Current Smoker,  breath sounds clear to auscultation  Pulmonary exam normal       Cardiovascular + dysrhythmias Atrial Fibrillation Rhythm:Regular Rate:Normal     Neuro/Psych negative neurological ROS  negative psych ROS   GI/Hepatic negative GI ROS, Neg liver ROS,   Endo/Other  negative endocrine ROS  Renal/GU negative Renal ROS  negative genitourinary   Musculoskeletal negative musculoskeletal ROS (+)   Abdominal   Peds negative pediatric ROS (+)  Hematology negative hematology ROS (+)   Anesthesia Other Findings   Reproductive/Obstetrics negative OB ROS                          Anesthesia Physical Anesthesia Plan  ASA: II  Anesthesia Plan: General   Post-op Pain Management:    Induction: Intravenous  Airway Management Planned: Oral ETT  Additional Equipment:   Intra-op Plan:   Post-operative Plan: Extubation in OR  Informed Consent: I have reviewed the patients History and Physical, chart, labs and discussed the procedure including the risks, benefits and alternatives for the proposed anesthesia with the patient or authorized representative who has indicated his/her understanding and acceptance.   Dental advisory given  Plan Discussed with: CRNA  Anesthesia Plan Comments:         Anesthesia Quick Evaluation

## 2012-10-31 NOTE — Transfer of Care (Signed)
Immediate Anesthesia Transfer of Care Note  Patient: Kyle Kelley  Procedure(s) Performed: Procedure(s) (LRB): ROBOTIC ASSISTED LAPAROSCOPIC RADICAL PROSTATECTOMY (N/A)  Patient Location: PACU  Anesthesia Type: General  Level of Consciousness: sedated, patient cooperative and responds to stimulaton  Airway & Oxygen Therapy: Patient Spontanous Breathing and Patient connected to face mask oxgen  Post-op Assessment: Report given to PACU RN and Post -op Vital signs reviewed and stable  Post vital signs: Reviewed and stable  Complications: No apparent anesthesia complications

## 2012-10-31 NOTE — H&P (Signed)
Reason For Visit         Mr. Kyle Kelley) and his wife present today for an extended cancer conference. He has been diagnosed recently with favorable risk clinical stage T1c adenocarcinoma of the prostate.   History of Present Illness            Kyle Kelley had presented as a referral/consult from Dr. Johnella Moloney for further assessment of an elevated PSA.  Kyle Kelley is currently 67 years of age without real urologic history.  He has had some longstanding and progressive mild to moderate obstructive and irritative voiding symptoms, but nothing that is particularly problematic.  Stream is weak at times with some frequency and mild nocturia.  The patient had a PSA approximately a year ago of 4.92.  This year his PSA has increased to 5.85.  The patient has had no significant change in voiding situation recently.  Urinalysis today does not show anything of significance.  He has not been on any recent antibiotics.  The patient does take Coumadin for atrial fibrillation and has been on that for approximately 10 years.  He has stopped it in the past for a colonoscopy without incident.  He does have some erectile dysfunction, but due to some medical issues with his wife, they have not pursued that.  Ultrasound revealed approximately a 40 one gram prostate. Unfortunately we did diagnosis adenocarcinoma of the prostate. On the right side only 1/6 cores were positive. This showed less than 5% involvement with a Gleason's 3+3 equals 6 cancer in one core. On the left side the patient had 5/6 cores positive. Again all biopsy showed Gleason's 3+3 equals 6 disease. Individual core involvement range from 5% to 60%. Clinically the patient has low risk clinical stage TI Cdisease   AUA 17/3    [id number]    Past Medical History Problems  1. History of  Atrial Fibrillation 427.31 2. History of  Hypercholesterolemia 272.0 3. History of  Hypertension 401.9  Surgical History Problems  1. History of  No Surgical  Problems  Current Meds 1. Diltiazem HCl ER Beads 300 MG Oral Capsule Extended Release 24 Hour; Therapy:  (Recorded:11Mar2014) to 2. Levofloxacin 500 MG Oral Tablet; TAKE 1 TABLET Daily Start taking the day prior to procedure  date; Therapy: 11Mar2014 to (Evaluate:14Mar2014); Last Rx:11Mar2014 3. Metoprolol Succinate ER 25 MG Oral Tablet Extended Release 24 Hour; Therapy:  (Recorded:11Mar2014) to 4. Warfarin Sodium 5 MG Oral Tablet; Therapy: (Recorded:11Mar2014) to  Allergies Medication  1. No Known Drug Allergies  Family History Problems  1. Paternal grandfather's history of  Acute Myocardial Infarction V17.3 2. Maternal grandfather's history of  Acute Myocardial Infarction V17.3 3. Paternal history of  Death In The Family Father 19yrs, stroke 4. Maternal history of  Family Health Status - Mother's Age 5yrs 5. Paternal history of  Stroke Syndrome V17.1 Denied  6. Family history of  Family Health Status Number Of Children  Social History Problems  1. Caffeine Use 3 qd 2. Marital History - Currently Married 3. Occupation: retired 4. Tobacco Use 305.1 1/3ppd for 2yrs Denied  5. History of  Alcohol Use  Review of Systems  Genitourinary: urinary frequency, nocturia and erectile dysfunction.  Integumentary: pruritus.  Hematologic/Lymphatic: a tendency to easily bruise.    Vitals Vital Signs [Data Includes: Last 1 Day]  05May2014 04:02PM  Blood Pressure: 123 / 75 Temperature: 98.5 F Heart Rate: 71  WD WN male in nad Resp: nl effort Cards: rrr Abd: soft/ NT GU: 1+ prostate witrhout  nodules Ext: nl  Assessment Assessed  1. Prostate Cancer 185  Plan Prostate Cancer (185)  1. PT/OT Referral Referral  Referral  Requested for: 05May2014  Discussion/Summary  A total of 45 minutes were spent in the overall care of the patient today with 45 minutes in direct face to face consultation.   The patient was counseled about the natural history of prostate cancer and  the standard treatment options that are available for prostate cancer. It was explained to him how his age and life expectancy, clinical stage, Gleason score, and PSA affect his prognosis, the decision to proceed with additional staging studies, as well as how that information influences recommended treatment strategies. We discussed the roles for active surveillance, radiation therapy, surgical therapy, androgen deprivation, as well as ablative therapy options for the treatment of prostate cancer as appropriate to his individual cancer situation. We discussed the risks and benefits of these options with regard to their impact on cancer control and also in terms of potential adverse events, complications, and impact on quiality of life particularly related to urinary, bowel, and sexual function. The patient was encouraged to ask questions throughout the discussion today and all questions were answered to his stated satisfaction. In addition, the patient was provided with and/or directed to appropriate resources and literature for further education about prostate cancer and treatment options.   We discussed surgical therapy for prostate cancer including the different available surgical approaches. We discussed, in detail, the risks and expectations of surgery with regard to cancer control, urinary control, and erectile function as well as the expected postoperative recovery process. The risks, potential complications/adverse events of radical prostatectomy as well as alternative options were explained to the patient.   We discussed surgical therapy for prostate cancer including the different available surgical approaches. We discussed, in detail, the risks and expectations of surgery with regard to cancer control, urinary control, and erectile function as well as the expected postoperative recovery process. Additional risks of surgery including but not limited to bleeding, infection, hernia formation, nerve damage,  lymphocele formation, bowel/rectal injury potentially necessitating colostomy, damage to the urinary tract resulting in urine leakage, urethral stricture, and the cardiopulmonary risks such as myocardial infarction, stroke, death, venothromboembolism, etc. were explained. The risk of open surgical conversion for robotic/laparoscopic prostatectomy was also discussed.    SignaturesElectronically signed by : Barron Alvine, M.D.; Oct 01 2012  4:45PM

## 2012-10-31 NOTE — Anesthesia Postprocedure Evaluation (Signed)
  Anesthesia Post-op Note  Patient: Kyle Kelley  Procedure(s) Performed: Procedure(s) (LRB): ROBOTIC ASSISTED LAPAROSCOPIC RADICAL PROSTATECTOMY (N/A)  Patient Location: PACU  Anesthesia Type: General  Level of Consciousness: awake and alert   Airway and Oxygen Therapy: Patient Spontanous Breathing  Post-op Pain: mild  Post-op Assessment: Post-op Vital signs reviewed, Patient's Cardiovascular Status Stable, Respiratory Function Stable, Patent Airway and No signs of Nausea or vomiting  Last Vitals:  Filed Vitals:   10/31/12 1243  BP: 113/65  Pulse: 81  Temp: 36.5 C  Resp: 18    Post-op Vital Signs: stable   Complications: No apparent anesthesia complications

## 2012-11-01 ENCOUNTER — Encounter (HOSPITAL_COMMUNITY): Payer: Self-pay | Admitting: Urology

## 2012-11-01 LAB — BASIC METABOLIC PANEL
Calcium: 8.5 mg/dL (ref 8.4–10.5)
GFR calc Af Amer: >90 mL/min (ref >90)
GFR calc non Af Amer: 88 mL/min — ABNORMAL LOW (ref >90)
Sodium: 137 mEq/L (ref 135–145)

## 2012-11-01 LAB — HEMOGLOBIN AND HEMATOCRIT, BLOOD
HCT: 43.7 % (ref 39.0–52.0)
Hemoglobin: 14.3 g/dL (ref 13.0–17.0)

## 2012-11-01 MED ORDER — BISACODYL 10 MG RE SUPP
10.0000 mg | Freq: Once | RECTAL | Status: AC
  Administered 2012-11-01: 10 mg via RECTAL
  Filled 2012-11-01: qty 1

## 2012-11-01 NOTE — Discharge Summary (Signed)
  Date of admission: 10/31/2012  Date of discharge: 11/01/2012  Admission diagnosis: Prostate Cancer  Discharge diagnosis: Prostate Cancer  History and Physical: For full details, please see admission history and physical. Briefly, Kyle Kelley is a 67 y.o. gentleman with localized prostate cancer.  After discussing management/treatment options, he elected to proceed with surgical treatment.  Hospital Course: Kyle Kelley was taken to the operating room on 10/31/2012 and underwent a robotic assisted laparoscopic radical prostatectomy. He tolerated this procedure well and without complications. Postoperatively, he was able to be transferred to a regular hospital room following recovery from anesthesia.  He was able to begin ambulating the night of surgery. He remained hemodynamically stable overnight.  He had excellent urine output with appropriately minimal output from his pelvic drain and his pelvic drain was removed on POD #1.  He was transitioned to oral pain medication, tolerated a clear liquid diet, and had met all discharge criteria and was able to be discharged home later on POD#1.  Laboratory values:  Recent Labs  10/31/12 1134 11/01/12 0522  HGB 15.6 14.3  HCT 46.9 43.7    Disposition: Home  Discharge instruction: He was instructed to be ambulatory but to refrain from heavy lifting, strenuous activity, or driving. He was instructed on urethral catheter care.  Discharge medications:     Medication List    TAKE these medications       ciprofloxacin 500 MG tablet  Commonly known as:  CIPRO  Take 1 tablet (500 mg total) by mouth 2 (two) times daily. Start day prior to office visit for foley removal     diltiazem 300 MG 24 hr capsule  Commonly known as:  TIAZAC  Take 300 mg by mouth daily before breakfast.     HYDROcodone-acetaminophen 5-325 MG per tablet  Commonly known as:  NORCO  Take 1-2 tablets by mouth every 6 (six) hours as needed for pain.     metoprolol succinate  25 MG 24 hr tablet  Commonly known as:  TOPROL-XL  Take 25 mg by mouth daily before breakfast.     warfarin 5 MG tablet  Commonly known as:  COUMADIN  Take 5 mg by mouth daily before breakfast.        Followup: He will followup in 1 week for catheter removal and to discuss his surgical pathology results.

## 2012-11-01 NOTE — Progress Notes (Signed)
1 Day Post-Op Subjective: Patient reports pain control good. Denies N/V.  Has ambulated   Objective: Vital signs in last 24 hours: Temp:  [97.7 F (36.5 C)-98.1 F (36.7 C)] 97.9 F (36.6 C) (06/05 0330) Pulse Rate:  [71-81] 81 (06/05 0330) Resp:  [16-24] 16 (06/05 0330) BP: (107-126)/(55-72) 121/70 mmHg (06/05 0330) SpO2:  [98 %-100 %] 100 % (06/05 0330) Weight:  [78.835 kg (173 lb 12.8 oz)] 78.835 kg (173 lb 12.8 oz) (06/04 1243)  Intake/Output from previous day: 06/04 0701 - 06/05 0700 In: 5695.4 [P.O.:1560; I.V.:2835.4; IV Piggyback:1300] Out: 4120 [Urine:3930; Drains:90; Blood:100] Intake/Output this shift:    Physical Exam:  General:alert, cooperative and no distress Cardiovascular: RRR Lungs: CTA GI: soft, non tender, decreased bowel sounds Incisions: dressings C/D/I Urine:clear/yellow Extremities: SCDs in place  Lab Results:  Recent Labs  10/31/12 1134 11/01/12 0522  HGB 15.6 14.3  HCT 46.9 43.7   BMET  Recent Labs  11/01/12 0522  NA 137  K 4.0  CL 102  CO2 30  GLUCOSE 117*  BUN 5*  CREATININE 0.89  CALCIUM 8.5    Recent Labs  10/31/12 0700  INR 1.10   No results found for this basename: LABURIN,  in the last 72 hours Results for orders placed during the hospital encounter of 10/24/12  SURGICAL PCR SCREEN     Status: None   Collection Time    10/24/12 10:30 AM      Result Value Range Status   MRSA, PCR NEGATIVE  NEGATIVE Final   Staphylococcus aureus NEGATIVE  NEGATIVE Final   Comment:            The Xpert SA Assay (FDA     approved for NASAL specimens     in patients over 30 years of age),     is one component of     a comprehensive surveillance     program.  Test performance has     been validated by [employer] for patients greater     than or equal to 106 year old.     It is not intended     to diagnose infection nor to     guide or monitor treatment.    Studies/Results: No results found.  Assessment/Plan: 1 Day  Post-Op, Procedure(s) (LRB): ROBOTIC ASSISTED LAPAROSCOPIC RADICAL PROSTATECTOMY (N/A)  Ambulate, Incentive spirometry DVT prophylaxis Transition to PO pain medications D/C pelvic drain SL IVF Dulcolax supp Poss d/c home later today   LOS: 1 day   YARBROUGH,Wendie Diskin G. 11/01/2012, 7:15 AM

## 2012-11-01 NOTE — Op Note (Signed)
Preoperative diagnosis: Clinical stage T1c Adenocarcinoma prostate  Postoperative diagnosis: Same  Procedure: Robotic-assisted laparoscopic radical retropubic prostatectomy   Surgeon: Valetta Fuller, MD  Asst.: Pecola Leisure, PA Anesthesia: Gen. Endotracheal  Indications: Patient was diagnosed with clinical stage TIc Adenocarcinoma the prostate. He underwent extensive consultation with regard to treatment options. The patient decided on a surgical approach. He appeared to understand the distinct advantages as well as the disadvantages of this procedure. The patient has performed a mechanical bowel prep. He has had placement of PAS compression boots and has received perioperative antibiotics. Ultrasound revealed a 41 g prostate.   Technique and findings:The patient was brought to the operating room and had successful induction of general endotracheal anesthesia.the patient was placed in a low lithotomy position with careful padding of all extremities. He was secured to the operative table and placed in the steep Trendelenburg position. He was prepped and draped in usual manner. A Foley catheter was placed sterilely on the field. Camera port site was chosen 18 cm above the pubic symphysis just to the left of the umbilicus. A standard open Hassan technique was utilized. A 12 mm trocar was placed without difficulty. The camera was then inserted and no abnormalities were noted within the pelvis. The trochars were placed with direct visual guidance. This included 3 8mm robotic trochars and a 12 mm and 5 mm assist ports. Once all the ports were placed the robot was docked. The bladder was filled and the space of Retzius was developed with electrocautery dissection as well as blunt dissection. Superficial fat off the endopelvic fascia and bladder neck was removed with electrocautery scissors. The endopelvic fascia was then incised bilaterally from base to apex. Levator musculature was swept off the apex of the  prostate isolating the dorsal venous complex which was then stapled with the ETS stapling device. The anterior bladder neck was identified with the aid of the Foley balloon. This was then transected down to the Foley catheter with electrocautery scissors. The Foley catheter was then retracted anteriorly. Indigo carmine was given and we appeared to be well away from the ureteral orifices. The posterior bladder neck was then transected and the dissection carried down to the adnexal structures. The seminal vesicles and vas deferens on both sides were then individually dissected free and retracted anteriorly. The posterior plane between the rectum and prostate was then established primarily with blunt dissection.  Attention was then turned towards nerve sparing. The patient was felt to be a candidate for bilateral nerve sparing. Superficial fascia along the anterior lateral aspect of the prostate was incised bilaterally. This tissue was then swept laterally until we were able to establish a groove between the neurovascular tissue and the posterior lateral aspect on the prostate bilaterally. This groove was then extended from the apex back to the base of the prostate. With the prostate retracted anteriorly the vascular pedicles of the prostate were taken with the Enseal device. The Foley catheter was then reinserted and the anterior urethra was transected. The posterior urethra was then transected as were some rectourethralis fibers. The prostate was then removed from the pelvis. The pelvis was then copiously irrigated. Rectal insufflation was performed and there was no evidence of rectal injury.   Attention was then turned towards reconstruction. The bladder neck did not require any reconstruction. The bladder neck and posterior urethra were reapproximated at the 6:00 position utilizing a 2-0 Vicryl suture. The rest of the anastomosis was done with a double-armed 3-0 Monocryl suture in a  360 degree manner. Additional  indigo carmine was given. A new catheter was placed and bladder irrigation revealed no evidence of leakage. A Blake drain was placed through one of the robotic trochars and positioned in the retropubic space above the anastomosis. This was then secured to the skin with a nylon suture. The prostate was placed in the Endopouch retrieval bag. The 12 mm trocar site was closed with a Vicryl suture with the aid of a suture passer. Our other trochars were taken out with direct visual guidance without evidence of any bleeding. The camera port incision was extended slightly to allow for removal of the specimen and then closed with a running Vicryl suture. All port sites were infiltrated with Marcaine and then closed with surgical clips. The patient was then taken to recovery room having had no obvious complications or problems. Sponge and needle counts were correct.

## 2012-11-14 DIAGNOSIS — Z7901 Long term (current) use of anticoagulants: Secondary | ICD-10-CM | POA: Diagnosis not present

## 2012-11-14 DIAGNOSIS — I4891 Unspecified atrial fibrillation: Secondary | ICD-10-CM | POA: Diagnosis not present

## 2012-11-28 DIAGNOSIS — M6281 Muscle weakness (generalized): Secondary | ICD-10-CM | POA: Diagnosis not present

## 2012-11-28 DIAGNOSIS — R279 Unspecified lack of coordination: Secondary | ICD-10-CM | POA: Diagnosis not present

## 2012-11-28 DIAGNOSIS — N393 Stress incontinence (female) (male): Secondary | ICD-10-CM | POA: Diagnosis not present

## 2012-12-05 DIAGNOSIS — M6281 Muscle weakness (generalized): Secondary | ICD-10-CM | POA: Diagnosis not present

## 2012-12-05 DIAGNOSIS — R279 Unspecified lack of coordination: Secondary | ICD-10-CM | POA: Diagnosis not present

## 2012-12-05 DIAGNOSIS — N393 Stress incontinence (female) (male): Secondary | ICD-10-CM | POA: Diagnosis not present

## 2012-12-11 DIAGNOSIS — R279 Unspecified lack of coordination: Secondary | ICD-10-CM | POA: Diagnosis not present

## 2012-12-11 DIAGNOSIS — M6281 Muscle weakness (generalized): Secondary | ICD-10-CM | POA: Diagnosis not present

## 2012-12-11 DIAGNOSIS — N393 Stress incontinence (female) (male): Secondary | ICD-10-CM | POA: Diagnosis not present

## 2012-12-11 DIAGNOSIS — I4891 Unspecified atrial fibrillation: Secondary | ICD-10-CM | POA: Diagnosis not present

## 2012-12-11 DIAGNOSIS — C61 Malignant neoplasm of prostate: Secondary | ICD-10-CM | POA: Diagnosis not present

## 2012-12-11 DIAGNOSIS — Z7901 Long term (current) use of anticoagulants: Secondary | ICD-10-CM | POA: Diagnosis not present

## 2012-12-26 DIAGNOSIS — N393 Stress incontinence (female) (male): Secondary | ICD-10-CM | POA: Diagnosis not present

## 2012-12-26 DIAGNOSIS — M6281 Muscle weakness (generalized): Secondary | ICD-10-CM | POA: Diagnosis not present

## 2012-12-26 DIAGNOSIS — R279 Unspecified lack of coordination: Secondary | ICD-10-CM | POA: Diagnosis not present

## 2013-01-09 DIAGNOSIS — R279 Unspecified lack of coordination: Secondary | ICD-10-CM | POA: Diagnosis not present

## 2013-01-09 DIAGNOSIS — N393 Stress incontinence (female) (male): Secondary | ICD-10-CM | POA: Diagnosis not present

## 2013-01-09 DIAGNOSIS — M6281 Muscle weakness (generalized): Secondary | ICD-10-CM | POA: Diagnosis not present

## 2013-01-22 DIAGNOSIS — I4891 Unspecified atrial fibrillation: Secondary | ICD-10-CM | POA: Diagnosis not present

## 2013-01-22 DIAGNOSIS — Z7901 Long term (current) use of anticoagulants: Secondary | ICD-10-CM | POA: Diagnosis not present

## 2013-01-30 DIAGNOSIS — N393 Stress incontinence (female) (male): Secondary | ICD-10-CM | POA: Diagnosis not present

## 2013-01-30 DIAGNOSIS — M6281 Muscle weakness (generalized): Secondary | ICD-10-CM | POA: Diagnosis not present

## 2013-01-30 DIAGNOSIS — R279 Unspecified lack of coordination: Secondary | ICD-10-CM | POA: Diagnosis not present

## 2013-02-27 DIAGNOSIS — M6281 Muscle weakness (generalized): Secondary | ICD-10-CM | POA: Diagnosis not present

## 2013-02-27 DIAGNOSIS — N393 Stress incontinence (female) (male): Secondary | ICD-10-CM | POA: Diagnosis not present

## 2013-02-27 DIAGNOSIS — R279 Unspecified lack of coordination: Secondary | ICD-10-CM | POA: Diagnosis not present

## 2013-03-05 ENCOUNTER — Ambulatory Visit (INDEPENDENT_AMBULATORY_CARE_PROVIDER_SITE_OTHER): Payer: Medicare Other | Admitting: Pharmacist

## 2013-03-05 DIAGNOSIS — I4891 Unspecified atrial fibrillation: Secondary | ICD-10-CM | POA: Diagnosis not present

## 2013-03-05 DIAGNOSIS — I4821 Permanent atrial fibrillation: Secondary | ICD-10-CM | POA: Insufficient documentation

## 2013-03-21 DIAGNOSIS — D239 Other benign neoplasm of skin, unspecified: Secondary | ICD-10-CM | POA: Diagnosis not present

## 2013-03-21 DIAGNOSIS — L219 Seborrheic dermatitis, unspecified: Secondary | ICD-10-CM | POA: Diagnosis not present

## 2013-03-21 DIAGNOSIS — D2370 Other benign neoplasm of skin of unspecified lower limb, including hip: Secondary | ICD-10-CM | POA: Diagnosis not present

## 2013-03-21 DIAGNOSIS — Z85828 Personal history of other malignant neoplasm of skin: Secondary | ICD-10-CM | POA: Diagnosis not present

## 2013-03-21 DIAGNOSIS — L821 Other seborrheic keratosis: Secondary | ICD-10-CM | POA: Diagnosis not present

## 2013-03-21 DIAGNOSIS — D1739 Benign lipomatous neoplasm of skin and subcutaneous tissue of other sites: Secondary | ICD-10-CM | POA: Diagnosis not present

## 2013-03-25 ENCOUNTER — Other Ambulatory Visit: Payer: Self-pay | Admitting: Cardiology

## 2013-04-03 DIAGNOSIS — R279 Unspecified lack of coordination: Secondary | ICD-10-CM | POA: Diagnosis not present

## 2013-04-03 DIAGNOSIS — N393 Stress incontinence (female) (male): Secondary | ICD-10-CM | POA: Diagnosis not present

## 2013-04-03 DIAGNOSIS — M6281 Muscle weakness (generalized): Secondary | ICD-10-CM | POA: Diagnosis not present

## 2013-04-15 ENCOUNTER — Ambulatory Visit (INDEPENDENT_AMBULATORY_CARE_PROVIDER_SITE_OTHER): Payer: Medicare Other | Admitting: *Deleted

## 2013-04-15 DIAGNOSIS — C61 Malignant neoplasm of prostate: Secondary | ICD-10-CM | POA: Diagnosis not present

## 2013-04-15 DIAGNOSIS — I4891 Unspecified atrial fibrillation: Secondary | ICD-10-CM

## 2013-04-22 DIAGNOSIS — N529 Male erectile dysfunction, unspecified: Secondary | ICD-10-CM | POA: Diagnosis not present

## 2013-04-22 DIAGNOSIS — C61 Malignant neoplasm of prostate: Secondary | ICD-10-CM | POA: Diagnosis not present

## 2013-04-29 ENCOUNTER — Other Ambulatory Visit: Payer: Self-pay | Admitting: Cardiology

## 2013-05-08 ENCOUNTER — Encounter: Payer: Self-pay | Admitting: General Surgery

## 2013-05-08 DIAGNOSIS — I10 Essential (primary) hypertension: Secondary | ICD-10-CM

## 2013-05-08 DIAGNOSIS — E7800 Pure hypercholesterolemia, unspecified: Secondary | ICD-10-CM

## 2013-05-09 ENCOUNTER — Encounter: Payer: Self-pay | Admitting: Cardiology

## 2013-05-09 ENCOUNTER — Ambulatory Visit (INDEPENDENT_AMBULATORY_CARE_PROVIDER_SITE_OTHER): Payer: Medicare Other | Admitting: Cardiology

## 2013-05-09 VITALS — BP 120/80 | HR 81 | Ht 74.0 in | Wt 177.0 lb

## 2013-05-09 DIAGNOSIS — I10 Essential (primary) hypertension: Secondary | ICD-10-CM

## 2013-05-09 DIAGNOSIS — I4891 Unspecified atrial fibrillation: Secondary | ICD-10-CM | POA: Diagnosis not present

## 2013-05-09 NOTE — Patient Instructions (Signed)
Your physician recommends that you continue on your current medications as directed. Please refer to the Current Medication list given to you today.  Your physician wants you to follow-up in: 1 Year with Dr Turner You will receive a reminder letter in the mail two months in advance. If you don't receive a letter, please call our office to schedule the follow-up appointment.  

## 2013-05-09 NOTE — Progress Notes (Signed)
[address] [place], [place]  [address] Phone: [phone] Fax:  [phone]  Date:  05/09/2013   ID:  [name], DOB 04/16/1946, MRN [id number]  PCP:  Pearla Dubonnet, MD  Cardiologist:  Armanda Magic, MD     History of Present Illness: Kyle Kelley is a 67 y.o. male with a history of chronic atrial fibrillation on systemic anticoagulation and HTN who presents today for followup.  He is doing well.  He denies any chest pain, SOB, DOE, LE edema, dizziness, palpitations or syncope.     Wt Readings from Last 3 Encounters:  05/09/13 177 lb (80.287 kg)  05/08/13 183 lb 9.6 oz (83.28 kg)  10/31/12 173 lb 12.8 oz (78.835 kg)     Past Medical History  Diagnosis Date  . Pneumonia     hx of   . Hypertension   . Hyperlipidemia   . Inguinal hernia 2010    Small asymptomatic L inguinal Hernia  . Tobacco dependence     rarely smokes-3 cigs per week  . Sigmoid diverticulosis     on colonoscopy in 2010  . Skin cancer 02/2012    Dr Raynelle Bring  . Chronic atrial fibrillation     atrial fib   . Prostate cancer 07/2012    s/p prostatectomy    Current Outpatient Prescriptions  Medication Sig Dispense Refill  . diltiazem (CARDIZEM CD) 300 MG 24 hr capsule       . diltiazem (TIAZAC) 300 MG 24 hr capsule Take 300 mg by mouth daily before breakfast.      . EPINEPHrine (EPIPEN IJ) Inject 1 application as directed as needed.      . loratadine (CLARITIN) 10 MG tablet Take 10 mg by mouth daily as needed for allergies.      . metoprolol succinate (TOPROL-XL) 25 MG 24 hr tablet TAKE 1 TABLET BY MOUTH EVERY DAY  30 tablet  6  . warfarin (COUMADIN) 5 MG tablet TAKE 1 TABLET BY MOUTH EVERY DAY AND 7.5MG  TABLETS BY MOUTH SUNDAY  35 tablet  5   No current facility-administered medications for this visit.    Allergies:    Allergies  Allergen Reactions  . Rythmol [Propafenone] Shortness Of Breath  . Ace Inhibitors   . Nitrofurantoin Rash    Social History:  The patient   reports that he has been smoking Cigarettes.  He has a 25 pack-year smoking history. He has never used smokeless tobacco. He reports that he drinks alcohol. He reports that he does not use illicit drugs.   Family History:  The patient's family history includes Heart disease in his father; Hyperlipidemia in his father; Hypertension in his mother.   ROS:  Please see the history of present illness.      All other systems reviewed and negative.   PHYSICAL EXAM: VS:  BP 120/80  Pulse 81  Ht 6\' 2"  (1.88 m)  Wt 177 lb (80.287 kg)  BMI 22.72 kg/m2 Well nourished, well developed, in no acute distress HEENT: normal Neck: no JVD Cardiac:  normal S1, S2; RRR; no murmur Lungs:  clear to auscultation bilaterally, no wheezing, rhonchi or rales Abd: soft, nontender, no hepatomegaly Ext: no edema Skin: warm and dry Neuro:  CNs 2-12 intact, no focal abnormalities noted  EKG:  Atrial fibrillation     ASSESSMENT AND PLAN:  1. Chronic atrial fibrillation rate controlled  - continue diltiazem/metoprolol/warfarin 2. HTN - well controlled  - continue diltiazem/metoprolol 3. Systemic anticoagulation  Followup with me in 1 year  Signed, Armanda Magic, MD 05/09/2013 10:22 AM

## 2013-05-28 ENCOUNTER — Ambulatory Visit (INDEPENDENT_AMBULATORY_CARE_PROVIDER_SITE_OTHER): Payer: Medicare Other

## 2013-05-28 DIAGNOSIS — I4891 Unspecified atrial fibrillation: Secondary | ICD-10-CM | POA: Diagnosis not present

## 2013-05-28 DIAGNOSIS — I482 Chronic atrial fibrillation, unspecified: Secondary | ICD-10-CM

## 2013-07-09 ENCOUNTER — Ambulatory Visit (INDEPENDENT_AMBULATORY_CARE_PROVIDER_SITE_OTHER): Payer: Medicare Other | Admitting: *Deleted

## 2013-07-09 DIAGNOSIS — I4891 Unspecified atrial fibrillation: Secondary | ICD-10-CM | POA: Diagnosis not present

## 2013-07-09 DIAGNOSIS — Z5181 Encounter for therapeutic drug level monitoring: Secondary | ICD-10-CM | POA: Diagnosis not present

## 2013-07-09 DIAGNOSIS — I482 Chronic atrial fibrillation, unspecified: Secondary | ICD-10-CM

## 2013-07-09 LAB — POCT INR: INR: 2.2

## 2013-07-22 DIAGNOSIS — I4891 Unspecified atrial fibrillation: Secondary | ICD-10-CM | POA: Diagnosis not present

## 2013-07-22 DIAGNOSIS — Z0000 Encounter for general adult medical examination without abnormal findings: Secondary | ICD-10-CM | POA: Diagnosis not present

## 2013-07-22 DIAGNOSIS — F17200 Nicotine dependence, unspecified, uncomplicated: Secondary | ICD-10-CM | POA: Diagnosis not present

## 2013-07-22 DIAGNOSIS — Z1331 Encounter for screening for depression: Secondary | ICD-10-CM | POA: Diagnosis not present

## 2013-07-22 DIAGNOSIS — E559 Vitamin D deficiency, unspecified: Secondary | ICD-10-CM | POA: Diagnosis not present

## 2013-07-22 DIAGNOSIS — E7800 Pure hypercholesterolemia, unspecified: Secondary | ICD-10-CM | POA: Diagnosis not present

## 2013-07-22 DIAGNOSIS — I10 Essential (primary) hypertension: Secondary | ICD-10-CM | POA: Diagnosis not present

## 2013-07-22 DIAGNOSIS — C61 Malignant neoplasm of prostate: Secondary | ICD-10-CM | POA: Diagnosis not present

## 2013-07-22 DIAGNOSIS — Z91038 Other insect allergy status: Secondary | ICD-10-CM | POA: Diagnosis not present

## 2013-08-06 DIAGNOSIS — C61 Malignant neoplasm of prostate: Secondary | ICD-10-CM | POA: Diagnosis not present

## 2013-08-13 DIAGNOSIS — C61 Malignant neoplasm of prostate: Secondary | ICD-10-CM | POA: Diagnosis not present

## 2013-08-20 ENCOUNTER — Ambulatory Visit (INDEPENDENT_AMBULATORY_CARE_PROVIDER_SITE_OTHER): Payer: Medicare Other

## 2013-08-20 DIAGNOSIS — I482 Chronic atrial fibrillation, unspecified: Secondary | ICD-10-CM

## 2013-08-20 DIAGNOSIS — Z5181 Encounter for therapeutic drug level monitoring: Secondary | ICD-10-CM | POA: Diagnosis not present

## 2013-08-20 DIAGNOSIS — I4891 Unspecified atrial fibrillation: Secondary | ICD-10-CM

## 2013-08-20 LAB — POCT INR: INR: 2.8

## 2013-10-01 ENCOUNTER — Ambulatory Visit (INDEPENDENT_AMBULATORY_CARE_PROVIDER_SITE_OTHER): Payer: Medicare Other | Admitting: Pharmacist Clinician (PhC)/ Clinical Pharmacy Specialist

## 2013-10-01 DIAGNOSIS — Z5181 Encounter for therapeutic drug level monitoring: Secondary | ICD-10-CM | POA: Diagnosis not present

## 2013-10-01 DIAGNOSIS — I482 Chronic atrial fibrillation, unspecified: Secondary | ICD-10-CM

## 2013-10-01 DIAGNOSIS — I4891 Unspecified atrial fibrillation: Secondary | ICD-10-CM | POA: Diagnosis not present

## 2013-10-01 LAB — POCT INR: INR: 2.8

## 2013-10-18 ENCOUNTER — Telehealth: Payer: Self-pay | Admitting: *Deleted

## 2013-10-18 DIAGNOSIS — D239 Other benign neoplasm of skin, unspecified: Secondary | ICD-10-CM | POA: Diagnosis not present

## 2013-10-18 DIAGNOSIS — D2360 Other benign neoplasm of skin of unspecified upper limb, including shoulder: Secondary | ICD-10-CM | POA: Diagnosis not present

## 2013-10-18 DIAGNOSIS — D2370 Other benign neoplasm of skin of unspecified lower limb, including hip: Secondary | ICD-10-CM | POA: Diagnosis not present

## 2013-10-18 DIAGNOSIS — Z85828 Personal history of other malignant neoplasm of skin: Secondary | ICD-10-CM | POA: Diagnosis not present

## 2013-10-18 MED ORDER — WARFARIN SODIUM 5 MG PO TABS: ORAL_TABLET | ORAL | Status: DC

## 2013-10-18 NOTE — Telephone Encounter (Signed)
[facility] in [place] requests coumadin refill for patient. Thanks, MI

## 2013-10-18 NOTE — Telephone Encounter (Signed)
Refill done as requested 

## 2013-11-12 ENCOUNTER — Ambulatory Visit (INDEPENDENT_AMBULATORY_CARE_PROVIDER_SITE_OTHER): Payer: Medicare Other

## 2013-11-12 DIAGNOSIS — I4891 Unspecified atrial fibrillation: Secondary | ICD-10-CM

## 2013-11-12 DIAGNOSIS — Z5181 Encounter for therapeutic drug level monitoring: Secondary | ICD-10-CM | POA: Diagnosis not present

## 2013-11-12 DIAGNOSIS — I482 Chronic atrial fibrillation, unspecified: Secondary | ICD-10-CM

## 2013-11-12 LAB — POCT INR: INR: 2.6

## 2013-12-03 ENCOUNTER — Other Ambulatory Visit: Payer: Self-pay | Admitting: *Deleted

## 2013-12-03 MED ORDER — METOPROLOL SUCCINATE ER 25 MG PO TB24: ORAL_TABLET | ORAL | Status: DC

## 2013-12-06 DIAGNOSIS — C61 Malignant neoplasm of prostate: Secondary | ICD-10-CM | POA: Diagnosis not present

## 2013-12-13 DIAGNOSIS — C61 Malignant neoplasm of prostate: Secondary | ICD-10-CM | POA: Diagnosis not present

## 2013-12-24 ENCOUNTER — Ambulatory Visit (INDEPENDENT_AMBULATORY_CARE_PROVIDER_SITE_OTHER): Payer: Medicare Other | Admitting: Surgery

## 2013-12-24 DIAGNOSIS — Z5181 Encounter for therapeutic drug level monitoring: Secondary | ICD-10-CM | POA: Diagnosis not present

## 2013-12-24 DIAGNOSIS — I482 Chronic atrial fibrillation, unspecified: Secondary | ICD-10-CM

## 2013-12-24 DIAGNOSIS — I4891 Unspecified atrial fibrillation: Secondary | ICD-10-CM

## 2013-12-24 LAB — POCT INR: INR: 3.2

## 2014-02-04 ENCOUNTER — Ambulatory Visit (INDEPENDENT_AMBULATORY_CARE_PROVIDER_SITE_OTHER): Payer: Medicare Other | Admitting: Pharmacist

## 2014-02-04 DIAGNOSIS — I4891 Unspecified atrial fibrillation: Secondary | ICD-10-CM | POA: Diagnosis not present

## 2014-02-04 DIAGNOSIS — Z5181 Encounter for therapeutic drug level monitoring: Secondary | ICD-10-CM

## 2014-02-04 DIAGNOSIS — I482 Chronic atrial fibrillation, unspecified: Secondary | ICD-10-CM

## 2014-02-04 LAB — POCT INR: INR: 3.4

## 2014-03-04 ENCOUNTER — Ambulatory Visit (INDEPENDENT_AMBULATORY_CARE_PROVIDER_SITE_OTHER): Payer: Medicare Other | Admitting: *Deleted

## 2014-03-04 DIAGNOSIS — Z5181 Encounter for therapeutic drug level monitoring: Secondary | ICD-10-CM | POA: Diagnosis not present

## 2014-03-04 DIAGNOSIS — I4891 Unspecified atrial fibrillation: Secondary | ICD-10-CM

## 2014-03-04 DIAGNOSIS — I482 Chronic atrial fibrillation, unspecified: Secondary | ICD-10-CM

## 2014-03-04 LAB — POCT INR: INR: 2.5

## 2014-03-21 ENCOUNTER — Other Ambulatory Visit: Payer: Self-pay | Admitting: *Deleted

## 2014-03-21 MED ORDER — WARFARIN SODIUM 5 MG PO TABS: ORAL_TABLET | ORAL | Status: DC

## 2014-04-03 ENCOUNTER — Other Ambulatory Visit: Payer: Self-pay | Admitting: Dermatology

## 2014-04-03 DIAGNOSIS — D225 Melanocytic nevi of trunk: Secondary | ICD-10-CM | POA: Diagnosis not present

## 2014-04-03 DIAGNOSIS — D2272 Melanocytic nevi of left lower limb, including hip: Secondary | ICD-10-CM | POA: Diagnosis not present

## 2014-04-03 DIAGNOSIS — L720 Epidermal cyst: Secondary | ICD-10-CM | POA: Diagnosis not present

## 2014-04-03 DIAGNOSIS — L821 Other seborrheic keratosis: Secondary | ICD-10-CM | POA: Diagnosis not present

## 2014-04-03 DIAGNOSIS — Z85828 Personal history of other malignant neoplasm of skin: Secondary | ICD-10-CM | POA: Diagnosis not present

## 2014-04-03 DIAGNOSIS — D2271 Melanocytic nevi of right lower limb, including hip: Secondary | ICD-10-CM | POA: Diagnosis not present

## 2014-04-03 DIAGNOSIS — D485 Neoplasm of uncertain behavior of skin: Secondary | ICD-10-CM | POA: Diagnosis not present

## 2014-04-03 DIAGNOSIS — L859 Epidermal thickening, unspecified: Secondary | ICD-10-CM | POA: Diagnosis not present

## 2014-04-08 DIAGNOSIS — C61 Malignant neoplasm of prostate: Secondary | ICD-10-CM | POA: Diagnosis not present

## 2014-04-14 ENCOUNTER — Ambulatory Visit (INDEPENDENT_AMBULATORY_CARE_PROVIDER_SITE_OTHER): Payer: Medicare Other | Admitting: *Deleted

## 2014-04-14 DIAGNOSIS — Z5181 Encounter for therapeutic drug level monitoring: Secondary | ICD-10-CM | POA: Diagnosis not present

## 2014-04-14 DIAGNOSIS — I4891 Unspecified atrial fibrillation: Secondary | ICD-10-CM | POA: Diagnosis not present

## 2014-04-14 DIAGNOSIS — I482 Chronic atrial fibrillation, unspecified: Secondary | ICD-10-CM

## 2014-04-14 LAB — POCT INR: INR: 3.1

## 2014-04-15 DIAGNOSIS — C61 Malignant neoplasm of prostate: Secondary | ICD-10-CM | POA: Diagnosis not present

## 2014-05-01 ENCOUNTER — Other Ambulatory Visit: Payer: Self-pay

## 2014-05-01 MED ORDER — METOPROLOL SUCCINATE ER 25 MG PO TB24: ORAL_TABLET | ORAL | Status: DC

## 2014-05-06 ENCOUNTER — Ambulatory Visit (INDEPENDENT_AMBULATORY_CARE_PROVIDER_SITE_OTHER): Payer: Medicare Other | Admitting: Cardiology

## 2014-05-06 ENCOUNTER — Encounter: Payer: Self-pay | Admitting: Cardiology

## 2014-05-06 VITALS — BP 130/80 | HR 86 | Ht 74.0 in | Wt 182.0 lb

## 2014-05-06 DIAGNOSIS — I482 Chronic atrial fibrillation, unspecified: Secondary | ICD-10-CM

## 2014-05-06 DIAGNOSIS — I10 Essential (primary) hypertension: Secondary | ICD-10-CM

## 2014-05-06 DIAGNOSIS — Z5181 Encounter for therapeutic drug level monitoring: Secondary | ICD-10-CM

## 2014-05-06 NOTE — Patient Instructions (Signed)
Your physician recommends that you continue on your current medications as directed. Please refer to the Current Medication list given to you today.  Your physician wants you to follow-up in: 1 year with Dr. Turner. You will receive a reminder letter in the mail two months in advance. If you don't receive a letter, please call our office to schedule the follow-up appointment.  

## 2014-05-06 NOTE — Progress Notes (Signed)
[place], [place], [place]  [address] Phone: [phone] Fax:  [phone]  Date:  05/06/2014   ID:  Kyle Kelley, DOB 10-Sep-1945, MRN [id number]  PCP:  Henrine Screws, MD  Cardiologist:  Fransico Him, MD    History of Present Illness: Kyle Kelley is a 68 y.o. male with a history of chronic atrial fibrillation on systemic anticoagulation and HTN who presents today for followup. He is doing well. He denies any anginal chest pain, SOB, DOE, LE edema, dizziness, palpitations or syncope.  He has some asthma due to seasonal allergies and his chest will get tight when he has an asthma flare.  He walks several miles daily.    Wt Readings from Last 3 Encounters:  05/06/14 182 lb (82.555 kg)  05/09/13 177 lb (80.287 kg)  05/08/13 183 lb 9.6 oz (83.28 kg)     Past Medical History  Diagnosis Date  . Pneumonia     hx of   . Hypertension   . Hyperlipidemia   . Inguinal hernia 2010    Small asymptomatic L inguinal Hernia  . Tobacco dependence     rarely smokes-3 cigs per week  . Sigmoid diverticulosis     on colonoscopy in 2010  . Skin cancer 02/2012    Dr Holley Dexter  . Chronic atrial fibrillation     atrial fib   . Prostate cancer 07/2012    s/p prostatectomy  . Dyslipidemia   . PSA elevation     workup per Dr. Rana Snare, March 2014    Current Outpatient Prescriptions  Medication Sig Dispense Refill  . diltiazem (CARDIZEM CD) 300 MG 24 hr capsule Take 300 mg by mouth daily.     Marland Kitchen EPINEPHrine (EPIPEN IJ) Inject 1 application as directed as needed.    . loratadine (CLARITIN) 10 MG tablet Take 10 mg by mouth daily as needed for allergies.    . metoprolol succinate (TOPROL-XL) 25 MG 24 hr tablet TAKE 1 TABLET BY MOUTH EVERY DAY 30 tablet 4  . warfarin (COUMADIN) 5 MG tablet Take as directed by coumadin clinic 40 tablet 3   No current facility-administered medications for this visit.    Allergies:    Allergies  Allergen Reactions  . Rythmol  [Propafenone] Shortness Of Breath  . Ace Inhibitors   . Nitrofurantoin Rash    Social History:  The patient  reports that he has been smoking Cigarettes.  He has a 25 pack-year smoking history. He has never used smokeless tobacco. He reports that he drinks alcohol. He reports that he does not use illicit drugs.   Family History:  The patient's family history includes Heart disease in his father; Hyperlipidemia in his father; Hypertension in his mother.   ROS:  Please see the history of present illness.      All other systems reviewed and negative.   PHYSICAL EXAM: VS:  BP 130/80 mmHg  Pulse 86  Ht 6\' 2"  (1.88 m)  Wt 182 lb (82.555 kg)  BMI 23.36 kg/m2 Well nourished, well developed, in no acute distress HEENT: normal Neck: no JVD Cardiac:  normal S1, S2; RRR; no murmur Lungs:  clear to auscultation bilaterally, no wheezing, rhonchi or rales Abd: soft, nontender, no hepatomegaly Ext: no edema Skin: warm and dry Neuro:  CNs 2-12 intact, no focal abnormalities noted  EKG:  Atrial fibrillation with CVR at 86bpm     ASSESSMENT AND PLAN:  1. Chronic atrial fibrillation rate controlled - continue  diltiazem/metoprolol/warfarin 2. HTN - well controlled - continue diltiazem/metoprolol 3. Systemic anticoagulation  Followup with me in 1 year   Signed, Fransico Him, MD [facility] 05/06/2014 10:43 AM

## 2014-05-27 ENCOUNTER — Ambulatory Visit (INDEPENDENT_AMBULATORY_CARE_PROVIDER_SITE_OTHER): Payer: Medicare Other | Admitting: *Deleted

## 2014-05-27 DIAGNOSIS — I482 Chronic atrial fibrillation, unspecified: Secondary | ICD-10-CM

## 2014-05-27 DIAGNOSIS — I4891 Unspecified atrial fibrillation: Secondary | ICD-10-CM | POA: Diagnosis not present

## 2014-05-27 DIAGNOSIS — Z5181 Encounter for therapeutic drug level monitoring: Secondary | ICD-10-CM

## 2014-05-27 LAB — POCT INR: INR: 3

## 2014-07-08 ENCOUNTER — Ambulatory Visit (INDEPENDENT_AMBULATORY_CARE_PROVIDER_SITE_OTHER): Payer: Medicare Other | Admitting: *Deleted

## 2014-07-08 DIAGNOSIS — Z5181 Encounter for therapeutic drug level monitoring: Secondary | ICD-10-CM

## 2014-07-08 DIAGNOSIS — I4891 Unspecified atrial fibrillation: Secondary | ICD-10-CM

## 2014-07-08 DIAGNOSIS — I482 Chronic atrial fibrillation, unspecified: Secondary | ICD-10-CM

## 2014-07-08 LAB — POCT INR: INR: 2.7

## 2014-07-29 DIAGNOSIS — Z23 Encounter for immunization: Secondary | ICD-10-CM | POA: Diagnosis not present

## 2014-07-29 DIAGNOSIS — Z1389 Encounter for screening for other disorder: Secondary | ICD-10-CM | POA: Diagnosis not present

## 2014-07-29 DIAGNOSIS — E780 Pure hypercholesterolemia: Secondary | ICD-10-CM | POA: Diagnosis not present

## 2014-07-29 DIAGNOSIS — Z79899 Other long term (current) drug therapy: Secondary | ICD-10-CM | POA: Diagnosis not present

## 2014-07-29 DIAGNOSIS — C61 Malignant neoplasm of prostate: Secondary | ICD-10-CM | POA: Diagnosis not present

## 2014-07-29 DIAGNOSIS — Z91030 Bee allergy status: Secondary | ICD-10-CM | POA: Diagnosis not present

## 2014-07-29 DIAGNOSIS — Z7901 Long term (current) use of anticoagulants: Secondary | ICD-10-CM | POA: Diagnosis not present

## 2014-07-29 DIAGNOSIS — I482 Chronic atrial fibrillation: Secondary | ICD-10-CM | POA: Diagnosis not present

## 2014-07-29 DIAGNOSIS — Z0001 Encounter for general adult medical examination with abnormal findings: Secondary | ICD-10-CM | POA: Diagnosis not present

## 2014-07-29 DIAGNOSIS — I10 Essential (primary) hypertension: Secondary | ICD-10-CM | POA: Diagnosis not present

## 2014-07-29 DIAGNOSIS — E559 Vitamin D deficiency, unspecified: Secondary | ICD-10-CM | POA: Diagnosis not present

## 2014-07-29 DIAGNOSIS — J309 Allergic rhinitis, unspecified: Secondary | ICD-10-CM | POA: Diagnosis not present

## 2014-08-11 ENCOUNTER — Other Ambulatory Visit: Payer: Self-pay

## 2014-08-11 DIAGNOSIS — C61 Malignant neoplasm of prostate: Secondary | ICD-10-CM | POA: Diagnosis not present

## 2014-08-11 MED ORDER — WARFARIN SODIUM 5 MG PO TABS: ORAL_TABLET | ORAL | Status: DC

## 2014-08-18 ENCOUNTER — Ambulatory Visit (INDEPENDENT_AMBULATORY_CARE_PROVIDER_SITE_OTHER): Payer: Medicare Other | Admitting: *Deleted

## 2014-08-18 DIAGNOSIS — I482 Chronic atrial fibrillation, unspecified: Secondary | ICD-10-CM

## 2014-08-18 DIAGNOSIS — Z8546 Personal history of malignant neoplasm of prostate: Secondary | ICD-10-CM | POA: Diagnosis not present

## 2014-08-18 DIAGNOSIS — N5201 Erectile dysfunction due to arterial insufficiency: Secondary | ICD-10-CM | POA: Diagnosis not present

## 2014-08-18 DIAGNOSIS — I4891 Unspecified atrial fibrillation: Secondary | ICD-10-CM | POA: Diagnosis not present

## 2014-08-18 DIAGNOSIS — Z5181 Encounter for therapeutic drug level monitoring: Secondary | ICD-10-CM

## 2014-08-18 LAB — POCT INR: INR: 2.6

## 2014-09-27 ENCOUNTER — Other Ambulatory Visit: Payer: Self-pay | Admitting: Cardiology

## 2014-09-29 ENCOUNTER — Ambulatory Visit (INDEPENDENT_AMBULATORY_CARE_PROVIDER_SITE_OTHER): Payer: Medicare Other | Admitting: *Deleted

## 2014-09-29 DIAGNOSIS — Z5181 Encounter for therapeutic drug level monitoring: Secondary | ICD-10-CM

## 2014-09-29 DIAGNOSIS — I4891 Unspecified atrial fibrillation: Secondary | ICD-10-CM

## 2014-09-29 DIAGNOSIS — I482 Chronic atrial fibrillation, unspecified: Secondary | ICD-10-CM

## 2014-09-29 LAB — POCT INR: INR: 2

## 2014-10-02 DIAGNOSIS — Z85828 Personal history of other malignant neoplasm of skin: Secondary | ICD-10-CM | POA: Diagnosis not present

## 2014-10-02 DIAGNOSIS — L720 Epidermal cyst: Secondary | ICD-10-CM | POA: Diagnosis not present

## 2014-10-02 DIAGNOSIS — D2272 Melanocytic nevi of left lower limb, including hip: Secondary | ICD-10-CM | POA: Diagnosis not present

## 2014-10-02 DIAGNOSIS — D2271 Melanocytic nevi of right lower limb, including hip: Secondary | ICD-10-CM | POA: Diagnosis not present

## 2014-10-02 DIAGNOSIS — D225 Melanocytic nevi of trunk: Secondary | ICD-10-CM | POA: Diagnosis not present

## 2014-10-02 DIAGNOSIS — L821 Other seborrheic keratosis: Secondary | ICD-10-CM | POA: Diagnosis not present

## 2014-10-21 DIAGNOSIS — Z85828 Personal history of other malignant neoplasm of skin: Secondary | ICD-10-CM | POA: Diagnosis not present

## 2014-10-21 DIAGNOSIS — L720 Epidermal cyst: Secondary | ICD-10-CM | POA: Diagnosis not present

## 2014-11-10 ENCOUNTER — Ambulatory Visit (INDEPENDENT_AMBULATORY_CARE_PROVIDER_SITE_OTHER): Payer: Medicare Other | Admitting: *Deleted

## 2014-11-10 DIAGNOSIS — Z5181 Encounter for therapeutic drug level monitoring: Secondary | ICD-10-CM | POA: Diagnosis not present

## 2014-11-10 DIAGNOSIS — I4891 Unspecified atrial fibrillation: Secondary | ICD-10-CM | POA: Diagnosis not present

## 2014-11-10 DIAGNOSIS — I482 Chronic atrial fibrillation, unspecified: Secondary | ICD-10-CM

## 2014-11-10 LAB — POCT INR: INR: 2.3

## 2014-12-22 ENCOUNTER — Ambulatory Visit (INDEPENDENT_AMBULATORY_CARE_PROVIDER_SITE_OTHER): Payer: Medicare Other | Admitting: *Deleted

## 2014-12-22 DIAGNOSIS — I4891 Unspecified atrial fibrillation: Secondary | ICD-10-CM | POA: Diagnosis not present

## 2014-12-22 DIAGNOSIS — Z5181 Encounter for therapeutic drug level monitoring: Secondary | ICD-10-CM

## 2014-12-22 DIAGNOSIS — I482 Chronic atrial fibrillation, unspecified: Secondary | ICD-10-CM

## 2014-12-22 LAB — POCT INR: INR: 2.3

## 2015-01-06 ENCOUNTER — Other Ambulatory Visit: Payer: Self-pay | Admitting: Cardiology

## 2015-01-06 DIAGNOSIS — C44622 Squamous cell carcinoma of skin of right upper limb, including shoulder: Secondary | ICD-10-CM | POA: Diagnosis not present

## 2015-01-06 DIAGNOSIS — Z85828 Personal history of other malignant neoplasm of skin: Secondary | ICD-10-CM | POA: Diagnosis not present

## 2015-01-06 DIAGNOSIS — D485 Neoplasm of uncertain behavior of skin: Secondary | ICD-10-CM | POA: Diagnosis not present

## 2015-02-03 ENCOUNTER — Ambulatory Visit (INDEPENDENT_AMBULATORY_CARE_PROVIDER_SITE_OTHER): Payer: Medicare Other

## 2015-02-03 DIAGNOSIS — I482 Chronic atrial fibrillation, unspecified: Secondary | ICD-10-CM

## 2015-02-03 DIAGNOSIS — Z5181 Encounter for therapeutic drug level monitoring: Secondary | ICD-10-CM | POA: Diagnosis not present

## 2015-02-03 DIAGNOSIS — I4891 Unspecified atrial fibrillation: Secondary | ICD-10-CM

## 2015-02-03 LAB — POCT INR: INR: 2.4

## 2015-02-17 DIAGNOSIS — Z8546 Personal history of malignant neoplasm of prostate: Secondary | ICD-10-CM | POA: Diagnosis not present

## 2015-02-24 DIAGNOSIS — Z8546 Personal history of malignant neoplasm of prostate: Secondary | ICD-10-CM | POA: Diagnosis not present

## 2015-03-16 ENCOUNTER — Ambulatory Visit (INDEPENDENT_AMBULATORY_CARE_PROVIDER_SITE_OTHER): Payer: Medicare Other | Admitting: *Deleted

## 2015-03-16 DIAGNOSIS — Z5181 Encounter for therapeutic drug level monitoring: Secondary | ICD-10-CM

## 2015-03-16 DIAGNOSIS — I4891 Unspecified atrial fibrillation: Secondary | ICD-10-CM

## 2015-03-16 DIAGNOSIS — I482 Chronic atrial fibrillation, unspecified: Secondary | ICD-10-CM

## 2015-03-16 LAB — POCT INR: INR: 2.8

## 2015-03-30 ENCOUNTER — Other Ambulatory Visit: Payer: Self-pay | Admitting: Cardiology

## 2015-04-09 DIAGNOSIS — L720 Epidermal cyst: Secondary | ICD-10-CM | POA: Diagnosis not present

## 2015-04-09 DIAGNOSIS — L821 Other seborrheic keratosis: Secondary | ICD-10-CM | POA: Diagnosis not present

## 2015-04-09 DIAGNOSIS — D225 Melanocytic nevi of trunk: Secondary | ICD-10-CM | POA: Diagnosis not present

## 2015-04-09 DIAGNOSIS — Z85828 Personal history of other malignant neoplasm of skin: Secondary | ICD-10-CM | POA: Diagnosis not present

## 2015-04-09 DIAGNOSIS — L812 Freckles: Secondary | ICD-10-CM | POA: Diagnosis not present

## 2015-04-09 DIAGNOSIS — D2271 Melanocytic nevi of right lower limb, including hip: Secondary | ICD-10-CM | POA: Diagnosis not present

## 2015-04-27 ENCOUNTER — Ambulatory Visit (INDEPENDENT_AMBULATORY_CARE_PROVIDER_SITE_OTHER): Payer: Medicare Other | Admitting: *Deleted

## 2015-04-27 DIAGNOSIS — I4891 Unspecified atrial fibrillation: Secondary | ICD-10-CM | POA: Diagnosis not present

## 2015-04-27 DIAGNOSIS — I482 Chronic atrial fibrillation, unspecified: Secondary | ICD-10-CM

## 2015-04-27 DIAGNOSIS — Z5181 Encounter for therapeutic drug level monitoring: Secondary | ICD-10-CM | POA: Diagnosis not present

## 2015-04-27 LAB — POCT INR: INR: 3.1

## 2015-04-29 DIAGNOSIS — K625 Hemorrhage of anus and rectum: Secondary | ICD-10-CM | POA: Diagnosis not present

## 2015-04-29 DIAGNOSIS — K648 Other hemorrhoids: Secondary | ICD-10-CM | POA: Diagnosis not present

## 2015-04-29 DIAGNOSIS — R194 Change in bowel habit: Secondary | ICD-10-CM | POA: Diagnosis not present

## 2015-05-12 ENCOUNTER — Other Ambulatory Visit: Payer: Self-pay | Admitting: Cardiology

## 2015-05-26 ENCOUNTER — Ambulatory Visit (INDEPENDENT_AMBULATORY_CARE_PROVIDER_SITE_OTHER): Payer: Medicare Other | Admitting: Pharmacist

## 2015-05-26 DIAGNOSIS — I482 Chronic atrial fibrillation, unspecified: Secondary | ICD-10-CM

## 2015-05-26 DIAGNOSIS — I4891 Unspecified atrial fibrillation: Secondary | ICD-10-CM

## 2015-05-26 DIAGNOSIS — Z5181 Encounter for therapeutic drug level monitoring: Secondary | ICD-10-CM | POA: Diagnosis not present

## 2015-05-26 LAB — POCT INR: INR: 2.6

## 2015-06-09 ENCOUNTER — Ambulatory Visit: Payer: Medicare Other | Admitting: Cardiology

## 2015-06-09 ENCOUNTER — Encounter: Payer: Self-pay | Admitting: Cardiology

## 2015-06-09 ENCOUNTER — Ambulatory Visit (INDEPENDENT_AMBULATORY_CARE_PROVIDER_SITE_OTHER): Payer: Medicare Other | Admitting: Cardiology

## 2015-06-09 VITALS — BP 126/70 | HR 79 | Ht 74.0 in | Wt 169.4 lb

## 2015-06-09 DIAGNOSIS — I482 Chronic atrial fibrillation, unspecified: Secondary | ICD-10-CM

## 2015-06-09 DIAGNOSIS — I10 Essential (primary) hypertension: Secondary | ICD-10-CM

## 2015-06-09 NOTE — Progress Notes (Signed)
Cardiology Office Note   Date:  06/09/2015   ID:  Kyle Kelley, DOB [birth year], MRN [id number]  PCP:  Henrine Screws, MD    Chief Complaint  Patient presents with  . Atrial Fibrillation  . Hypertension      History of Present Illness: Kyle Kelley is a 70 y.o. male with a history of chronic atrial fibrillation on systemic anticoagulation and HTN who presents today for followup. He is doing well. He denies any anginal chest pain, SOB, DOE, LE edema, dizziness, palpitations or syncope.  He walks several miles daily.     Past Medical History  Diagnosis Date  . Pneumonia     hx of   . Hypertension   . Hyperlipidemia   . Inguinal hernia 2010    Small asymptomatic L inguinal Hernia  . Tobacco dependence     rarely smokes-3 cigs per week  . Sigmoid diverticulosis     on colonoscopy in 2010  . Skin cancer 02/2012    Dr Holley Dexter  . Chronic atrial fibrillation (HCC)     atrial fib   . Prostate cancer ([place]) 07/2012    s/p prostatectomy  . Dyslipidemia   . PSA elevation     workup per Dr. Rana Snare, March 2014    Past Surgical History  Procedure Laterality Date  . Robot assisted laparoscopic radical prostatectomy N/A 10/31/2012    Procedure: ROBOTIC ASSISTED LAPAROSCOPIC RADICAL PROSTATECTOMY;  Surgeon: Bernestine Amass, MD;  Location: [facility];  Service: Urology;  Laterality: N/A;  . Colonoscopy  2010  . Tonsillectomy and adenoidectomy    . Direct current cardioversion  08/05/2005  . Tonsillectomy and adenoidectomy    . Prostate biopsy  09/06/2012    Dr. Rana Snare  . Cardioversion  08/05/2005    Direct Current Cardioversion     Current Outpatient Prescriptions  Medication Sig Dispense Refill  . diltiazem (CARDIZEM CD) 300 MG 24 hr capsule Take 300 mg by mouth daily.     Marland Kitchen EPINEPHrine (EPIPEN IJ) Inject 1 application as directed as needed.    . loratadine (CLARITIN) 10 MG tablet Take 10 mg by mouth daily as needed for allergies.    .  metoprolol succinate (TOPROL-XL) 25 MG 24 hr tablet TAKE 1 TABLET BY MOUTH EVERY DAY 30 tablet 5  . warfarin (COUMADIN) 5 MG tablet TAKE 1 TABLET BY MOUTH EVERY DAY OR AS DIRECTED BY COUMADIN CLINIC 35 TABS FOR 30 DAYS 35 tablet 3   No current facility-administered medications for this visit.    Allergies:   Ace inhibitors; Rythmol; and Nitrofurantoin    Social History:  The patient  reports that he has been smoking Cigarettes.  He has a 25 pack-year smoking history. He has never used smokeless tobacco. He reports that he drinks alcohol. He reports that he does not use illicit drugs.   Family History:  The patient's family history includes Heart disease in his father; Hyperlipidemia in his father; Hypertension in his mother.    ROS:  Please see the history of present illness.   Otherwise, review of systems are positive for none.   All other systems are reviewed and negative.    PHYSICAL EXAM: VS:  BP 126/70 mmHg  Pulse 79  Ht 6\' 2"  (1.88 m)  Wt 169 lb 6.4 oz (76.839 kg)  BMI 21.74 kg/m2 , BMI Body mass index is 21.74 kg/(m^2). GEN:  Well nourished, well developed, in no acute distress HEENT: normal Neck: no JVD, carotid bruits, or masses Cardiac: RRR; no murmurs, rubs, or gallops,no edema  Respiratory:  clear to auscultation bilaterally, normal work of breathing GI: soft, nontender, nondistended, + BS MS: no deformity or atrophy Skin: warm and dry, no rash Neuro:  Strength and sensation are intact Psych: euthymic mood, full affect   EKG:  EKG is ordered today. The ekg ordered today demonstrates atrial fibrillation with CVR and no ST changes   Recent Labs: No results found for requested labs within last 365 days.    Lipid Panel No results found for: CHOL, TRIG, HDL, CHOLHDL, VLDL, LDLCALC, LDLDIRECT    Wt Readings from Last 3 Encounters:  06/09/15 169 lb 6.4 oz (76.839 kg)  05/06/14 182 lb (82.555 kg)  05/09/13 177 lb (80.287 kg)    ASSESSMENT AND  PLAN:  1. Chronic atrial fibrillation rate controlled - continue diltiazem/metoprolol/warfarin 2. HTN - well controlled - continue diltiazem/metoprolol 3. Systemic anticoagulation    Current medicines are reviewed at length with the patient today.  The patient does not have concerns regarding medicines.  The following changes have been made:  no change  Labs/ tests ordered today: See above Assessment and Plan No orders of the defined types were placed in this encounter.     Disposition:   FU with me in 1 year  Signed, Sueanne Margarita, MD  06/09/2015 11:06 AM    [facility] [place], [place], [place]  [address] Phone: [phone]; Fax: [phone]

## 2015-06-09 NOTE — Patient Instructions (Signed)

## 2015-07-07 ENCOUNTER — Ambulatory Visit (INDEPENDENT_AMBULATORY_CARE_PROVIDER_SITE_OTHER): Payer: Medicare Other | Admitting: *Deleted

## 2015-07-07 DIAGNOSIS — Z5181 Encounter for therapeutic drug level monitoring: Secondary | ICD-10-CM | POA: Diagnosis not present

## 2015-07-07 DIAGNOSIS — I4891 Unspecified atrial fibrillation: Secondary | ICD-10-CM

## 2015-07-07 DIAGNOSIS — I482 Chronic atrial fibrillation, unspecified: Secondary | ICD-10-CM

## 2015-07-07 LAB — POCT INR: INR: 2.8

## 2015-07-24 DIAGNOSIS — Z8546 Personal history of malignant neoplasm of prostate: Secondary | ICD-10-CM | POA: Diagnosis not present

## 2015-07-31 DIAGNOSIS — Z0000 Encounter for general adult medical examination without abnormal findings: Secondary | ICD-10-CM | POA: Diagnosis not present

## 2015-07-31 DIAGNOSIS — Z8546 Personal history of malignant neoplasm of prostate: Secondary | ICD-10-CM | POA: Diagnosis not present

## 2015-08-18 ENCOUNTER — Ambulatory Visit (INDEPENDENT_AMBULATORY_CARE_PROVIDER_SITE_OTHER): Payer: Medicare Other | Admitting: *Deleted

## 2015-08-18 DIAGNOSIS — I482 Chronic atrial fibrillation, unspecified: Secondary | ICD-10-CM

## 2015-08-18 DIAGNOSIS — I4891 Unspecified atrial fibrillation: Secondary | ICD-10-CM | POA: Diagnosis not present

## 2015-08-18 DIAGNOSIS — Z5181 Encounter for therapeutic drug level monitoring: Secondary | ICD-10-CM

## 2015-08-18 LAB — POCT INR: INR: 2.8

## 2015-09-23 ENCOUNTER — Other Ambulatory Visit: Payer: Self-pay | Admitting: Cardiology

## 2015-09-29 ENCOUNTER — Ambulatory Visit (INDEPENDENT_AMBULATORY_CARE_PROVIDER_SITE_OTHER): Payer: Medicare Other | Admitting: *Deleted

## 2015-09-29 DIAGNOSIS — I482 Chronic atrial fibrillation, unspecified: Secondary | ICD-10-CM

## 2015-09-29 DIAGNOSIS — Z5181 Encounter for therapeutic drug level monitoring: Secondary | ICD-10-CM | POA: Diagnosis not present

## 2015-09-29 DIAGNOSIS — I4891 Unspecified atrial fibrillation: Secondary | ICD-10-CM

## 2015-09-29 LAB — POCT INR: INR: 2.9

## 2015-10-03 ENCOUNTER — Other Ambulatory Visit: Payer: Self-pay | Admitting: Cardiology

## 2015-10-15 DIAGNOSIS — L723 Sebaceous cyst: Secondary | ICD-10-CM | POA: Diagnosis not present

## 2015-10-15 DIAGNOSIS — D485 Neoplasm of uncertain behavior of skin: Secondary | ICD-10-CM | POA: Diagnosis not present

## 2015-10-15 DIAGNOSIS — L821 Other seborrheic keratosis: Secondary | ICD-10-CM | POA: Diagnosis not present

## 2015-10-15 DIAGNOSIS — D2271 Melanocytic nevi of right lower limb, including hip: Secondary | ICD-10-CM | POA: Diagnosis not present

## 2015-10-15 DIAGNOSIS — D2272 Melanocytic nevi of left lower limb, including hip: Secondary | ICD-10-CM | POA: Diagnosis not present

## 2015-10-15 DIAGNOSIS — C44519 Basal cell carcinoma of skin of other part of trunk: Secondary | ICD-10-CM | POA: Diagnosis not present

## 2015-10-15 DIAGNOSIS — Z85828 Personal history of other malignant neoplasm of skin: Secondary | ICD-10-CM | POA: Diagnosis not present

## 2015-10-15 DIAGNOSIS — D1801 Hemangioma of skin and subcutaneous tissue: Secondary | ICD-10-CM | POA: Diagnosis not present

## 2015-10-30 DIAGNOSIS — Z1389 Encounter for screening for other disorder: Secondary | ICD-10-CM | POA: Diagnosis not present

## 2015-10-30 DIAGNOSIS — I10 Essential (primary) hypertension: Secondary | ICD-10-CM | POA: Diagnosis not present

## 2015-10-30 DIAGNOSIS — E782 Mixed hyperlipidemia: Secondary | ICD-10-CM | POA: Diagnosis not present

## 2015-10-30 DIAGNOSIS — J309 Allergic rhinitis, unspecified: Secondary | ICD-10-CM | POA: Diagnosis not present

## 2015-10-30 DIAGNOSIS — F17210 Nicotine dependence, cigarettes, uncomplicated: Secondary | ICD-10-CM | POA: Diagnosis not present

## 2015-10-30 DIAGNOSIS — I482 Chronic atrial fibrillation: Secondary | ICD-10-CM | POA: Diagnosis not present

## 2015-10-30 DIAGNOSIS — Z0000 Encounter for general adult medical examination without abnormal findings: Secondary | ICD-10-CM | POA: Diagnosis not present

## 2015-10-30 DIAGNOSIS — E559 Vitamin D deficiency, unspecified: Secondary | ICD-10-CM | POA: Diagnosis not present

## 2015-10-30 DIAGNOSIS — K5730 Diverticulosis of large intestine without perforation or abscess without bleeding: Secondary | ICD-10-CM | POA: Diagnosis not present

## 2015-10-30 DIAGNOSIS — Z91030 Bee allergy status: Secondary | ICD-10-CM | POA: Diagnosis not present

## 2015-10-30 DIAGNOSIS — C61 Malignant neoplasm of prostate: Secondary | ICD-10-CM | POA: Diagnosis not present

## 2015-10-30 DIAGNOSIS — Z79899 Other long term (current) drug therapy: Secondary | ICD-10-CM | POA: Diagnosis not present

## 2015-10-30 DIAGNOSIS — K625 Hemorrhage of anus and rectum: Secondary | ICD-10-CM | POA: Diagnosis not present

## 2015-11-10 ENCOUNTER — Ambulatory Visit (INDEPENDENT_AMBULATORY_CARE_PROVIDER_SITE_OTHER): Payer: Medicare Other | Admitting: Pharmacist

## 2015-11-10 DIAGNOSIS — Z5181 Encounter for therapeutic drug level monitoring: Secondary | ICD-10-CM

## 2015-11-10 DIAGNOSIS — I4891 Unspecified atrial fibrillation: Secondary | ICD-10-CM | POA: Diagnosis not present

## 2015-11-10 DIAGNOSIS — I482 Chronic atrial fibrillation, unspecified: Secondary | ICD-10-CM

## 2015-11-10 LAB — POCT INR: INR: 2.6

## 2015-12-22 ENCOUNTER — Ambulatory Visit (INDEPENDENT_AMBULATORY_CARE_PROVIDER_SITE_OTHER): Payer: Medicare Other

## 2015-12-22 DIAGNOSIS — I482 Chronic atrial fibrillation, unspecified: Secondary | ICD-10-CM

## 2015-12-22 DIAGNOSIS — I4891 Unspecified atrial fibrillation: Secondary | ICD-10-CM | POA: Diagnosis not present

## 2015-12-22 DIAGNOSIS — Z5181 Encounter for therapeutic drug level monitoring: Secondary | ICD-10-CM | POA: Diagnosis not present

## 2015-12-22 LAB — POCT INR: INR: 2.7

## 2016-01-29 DIAGNOSIS — Z8546 Personal history of malignant neoplasm of prostate: Secondary | ICD-10-CM | POA: Diagnosis not present

## 2016-02-01 ENCOUNTER — Other Ambulatory Visit: Payer: Self-pay | Admitting: Cardiology

## 2016-02-02 ENCOUNTER — Ambulatory Visit (INDEPENDENT_AMBULATORY_CARE_PROVIDER_SITE_OTHER): Payer: Medicare Other | Admitting: *Deleted

## 2016-02-02 DIAGNOSIS — I4891 Unspecified atrial fibrillation: Secondary | ICD-10-CM

## 2016-02-02 DIAGNOSIS — I482 Chronic atrial fibrillation, unspecified: Secondary | ICD-10-CM

## 2016-02-02 DIAGNOSIS — Z5181 Encounter for therapeutic drug level monitoring: Secondary | ICD-10-CM | POA: Diagnosis not present

## 2016-02-02 LAB — POCT INR: INR: 2.6

## 2016-02-05 DIAGNOSIS — Z8546 Personal history of malignant neoplasm of prostate: Secondary | ICD-10-CM | POA: Diagnosis not present

## 2016-03-15 ENCOUNTER — Ambulatory Visit (INDEPENDENT_AMBULATORY_CARE_PROVIDER_SITE_OTHER): Payer: Medicare Other | Admitting: *Deleted

## 2016-03-15 DIAGNOSIS — I4891 Unspecified atrial fibrillation: Secondary | ICD-10-CM | POA: Diagnosis not present

## 2016-03-15 DIAGNOSIS — I482 Chronic atrial fibrillation, unspecified: Secondary | ICD-10-CM

## 2016-03-15 DIAGNOSIS — Z5181 Encounter for therapeutic drug level monitoring: Secondary | ICD-10-CM | POA: Diagnosis not present

## 2016-03-15 LAB — POCT INR: INR: 2.4

## 2016-04-18 DIAGNOSIS — L821 Other seborrheic keratosis: Secondary | ICD-10-CM | POA: Diagnosis not present

## 2016-04-18 DIAGNOSIS — D225 Melanocytic nevi of trunk: Secondary | ICD-10-CM | POA: Diagnosis not present

## 2016-04-18 DIAGNOSIS — L720 Epidermal cyst: Secondary | ICD-10-CM | POA: Diagnosis not present

## 2016-04-18 DIAGNOSIS — D1801 Hemangioma of skin and subcutaneous tissue: Secondary | ICD-10-CM | POA: Diagnosis not present

## 2016-04-18 DIAGNOSIS — Z85828 Personal history of other malignant neoplasm of skin: Secondary | ICD-10-CM | POA: Diagnosis not present

## 2016-04-26 ENCOUNTER — Ambulatory Visit (INDEPENDENT_AMBULATORY_CARE_PROVIDER_SITE_OTHER): Payer: Medicare Other

## 2016-04-26 DIAGNOSIS — Z5181 Encounter for therapeutic drug level monitoring: Secondary | ICD-10-CM | POA: Diagnosis not present

## 2016-04-26 DIAGNOSIS — I482 Chronic atrial fibrillation, unspecified: Secondary | ICD-10-CM

## 2016-04-26 DIAGNOSIS — I4891 Unspecified atrial fibrillation: Secondary | ICD-10-CM

## 2016-04-26 LAB — POCT INR: INR: 2.7

## 2016-05-25 ENCOUNTER — Other Ambulatory Visit: Payer: Self-pay | Admitting: Cardiology

## 2016-06-07 ENCOUNTER — Encounter (INDEPENDENT_AMBULATORY_CARE_PROVIDER_SITE_OTHER): Payer: Self-pay

## 2016-06-07 ENCOUNTER — Ambulatory Visit (INDEPENDENT_AMBULATORY_CARE_PROVIDER_SITE_OTHER): Payer: Medicare Other | Admitting: *Deleted

## 2016-06-07 DIAGNOSIS — I482 Chronic atrial fibrillation, unspecified: Secondary | ICD-10-CM

## 2016-06-07 DIAGNOSIS — Z5181 Encounter for therapeutic drug level monitoring: Secondary | ICD-10-CM | POA: Diagnosis not present

## 2016-06-07 DIAGNOSIS — I4891 Unspecified atrial fibrillation: Secondary | ICD-10-CM | POA: Diagnosis not present

## 2016-06-07 LAB — POCT INR: INR: 2.7

## 2016-06-14 ENCOUNTER — Ambulatory Visit: Payer: PRIVATE HEALTH INSURANCE | Admitting: Cardiology

## 2016-06-16 ENCOUNTER — Ambulatory Visit: Payer: PRIVATE HEALTH INSURANCE | Admitting: Cardiology

## 2016-07-04 ENCOUNTER — Other Ambulatory Visit: Payer: Self-pay | Admitting: Cardiology

## 2016-07-11 DIAGNOSIS — Z23 Encounter for immunization: Secondary | ICD-10-CM | POA: Diagnosis not present

## 2016-07-20 ENCOUNTER — Ambulatory Visit (INDEPENDENT_AMBULATORY_CARE_PROVIDER_SITE_OTHER): Payer: Medicare Other | Admitting: Cardiology

## 2016-07-20 ENCOUNTER — Ambulatory Visit (INDEPENDENT_AMBULATORY_CARE_PROVIDER_SITE_OTHER): Payer: Medicare Other | Admitting: *Deleted

## 2016-07-20 ENCOUNTER — Encounter: Payer: Self-pay | Admitting: Cardiology

## 2016-07-20 VITALS — BP 138/84 | HR 98 | Ht 74.0 in | Wt 176.4 lb

## 2016-07-20 DIAGNOSIS — I10 Essential (primary) hypertension: Secondary | ICD-10-CM | POA: Diagnosis not present

## 2016-07-20 DIAGNOSIS — I482 Chronic atrial fibrillation, unspecified: Secondary | ICD-10-CM

## 2016-07-20 DIAGNOSIS — Z5181 Encounter for therapeutic drug level monitoring: Secondary | ICD-10-CM

## 2016-07-20 DIAGNOSIS — I4891 Unspecified atrial fibrillation: Secondary | ICD-10-CM

## 2016-07-20 DIAGNOSIS — I4821 Permanent atrial fibrillation: Secondary | ICD-10-CM

## 2016-07-20 LAB — POCT INR: INR: 2.3

## 2016-07-20 NOTE — Progress Notes (Signed)
Cardiology Office Note    Date:  07/20/2016   ID:  Kyle Kelley, DOB [birth year], MRN [id number]  PCP:  Henrine Screws, MD  Cardiologist:  Fransico Him, MD   Chief Complaint  Patient presents with  . Atrial Fibrillation  . Hypertension    History of Present Illness:  Kyle Kelley is a 71 y.o. male with a history of chronic atrial fibrillation on systemic anticoagulation and HTN who presents today for followup. He is doing well. He denies any anginal chest pain, SOB, DOE, LE edema, dizziness, palpitations, orthopnea, PND or syncope.  He walks several miles daily.    Past Medical History:  Diagnosis Date  . Dyslipidemia   . Hypertension   . Inguinal hernia 2010   Small asymptomatic L inguinal Hernia  . Permanent atrial fibrillation (HCC)    atrial fib   . Pneumonia    hx of   . Prostate cancer ([place]) 07/2012   s/p prostatectomy  . PSA elevation    workup per Dr. Rana Snare, March 2014  . Sigmoid diverticulosis    on colonoscopy in 2010  . Skin cancer 02/2012   Dr Holley Dexter  . Tobacco dependence    rarely smokes-3 cigs per week    Past Surgical History:  Procedure Laterality Date  . CARDIOVERSION  08/05/2005   Direct Current Cardioversion  . COLONOSCOPY  2010  . Direct current Cardioversion  08/05/2005  . PROSTATE BIOPSY  09/06/2012   Dr. Rana Snare  . ROBOT ASSISTED LAPAROSCOPIC RADICAL PROSTATECTOMY N/A 10/31/2012   Procedure: ROBOTIC ASSISTED LAPAROSCOPIC RADICAL PROSTATECTOMY;  Surgeon: Bernestine Amass, MD;  Location: [facility];  Service: Urology;  Laterality: N/A;  . TONSILLECTOMY AND ADENOIDECTOMY    . TONSILLECTOMY AND ADENOIDECTOMY      Current Medications: Current Meds  Medication Sig  . diltiazem (CARDIZEM CD) 300 MG 24 hr capsule Take 300 mg by mouth daily.   Marland Kitchen EPINEPHrine (EPIPEN IJ) Inject 1 application as directed as needed.  . loratadine (CLARITIN) 10 MG tablet Take 10 mg by mouth daily as needed for allergies.  . metoprolol succinate  (TOPROL-XL) 25 MG 24 hr tablet TAKE 1 TABLET BY MOUTH EVERY DAY  . warfarin (COUMADIN) 5 MG tablet Take as directed by coumadin clinic    Allergies:   Ace inhibitors; Rythmol [propafenone]; and Nitrofurantoin   Social History   Social History  . Marital status: Married    Spouse name: N/A  . Number of children: N/A  . Years of education: N/A   Social History Main Topics  . Smoking status: Current Every Day Smoker    Packs/day: 0.50    Years: 50.00    Types: Cigarettes  . Smokeless tobacco: Never Used  . Alcohol use Yes     Comment: rare, heavier in the past  . Drug use: No  . Sexual activity: Not Asked   Other Topics Concern  . None   Social History Narrative   Tobacco Use cigarettes: Current Smoker   Smoking: Yes, less than 1/2 pk per month   Alcohol: Yes - No Alcohol since June 2005   Caffeine: yes   No recreational drug use   Occupation: Employed   Marital Status: Married 10 years, wife has MS   Children: none     Family History:  The patient's family history includes Heart disease in his father; Hyperlipidemia in his father; Hypertension in his mother.   ROS:   Please see the history of present illness.  ROS All other systems reviewed and are negative.  No flowsheet data found.     PHYSICAL EXAM:   VS:  BP 138/84   Pulse 98   Ht 6\' 2"  (1.88 m)   Wt 176 lb 6.4 oz (80 kg)   BMI 22.65 kg/m    GEN: Well nourished, well developed, in no acute distress  HEENT: normal  Neck: no JVD, carotid bruits, or masses Cardiac: irregularly irregular; no murmurs, rubs, or gallops,no edema.  Intact distal pulses bilaterally.  Respiratory:  clear to auscultation bilaterally, normal work of breathing GI: soft, nontender, nondistended, + BS MS: no deformity or atrophy  Skin: warm and dry, no rash Neuro:  Alert and Oriented x 3, Strength and sensation are intact Psych: euthymic mood, full affect  Wt Readings from Last 3 Encounters:  07/20/16 176 lb 6.4 oz (80 kg)    06/09/15 169 lb 6.4 oz (76.8 kg)  05/06/14 182 lb (82.6 kg)      Studies/Labs Reviewed:   EKG:  EKG is not ordered today.    Recent Labs: No results found for requested labs within last 8760 hours.   Lipid Panel No results found for: CHOL, TRIG, HDL, CHOLHDL, VLDL, LDLCALC, LDLDIRECT  Additional studies/ records that were reviewed today include:  none    ASSESSMENT:    1. Permanent atrial fibrillation ([place])   2. Essential hypertension, benign      PLAN:  In order of problems listed above:  1. Permanent atrial fibrillation rate controlled.  He will continue on Cardizem/BB and warfarin.  His INR is therapeutic today at 2.3. 2. HTN - BP controlled on current meds.  He will continue on Cardizem and BB.    Medication Adjustments/Labs and Tests Ordered: Current medicines are reviewed at length with the patient today.  Concerns regarding medicines are outlined above.  Medication changes, Labs and Tests ordered today are listed in the Patient Instructions below.  There are no Patient Instructions on file for this visit.   Signed, Fransico Him, MD  07/20/2016 10:42 AM    [facility] [address], [place], [place]  [address] Phone: [phone]; Fax: [phone]

## 2016-07-20 NOTE — Patient Instructions (Signed)

## 2016-08-01 ENCOUNTER — Other Ambulatory Visit: Payer: Self-pay | Admitting: Cardiology

## 2016-08-01 DIAGNOSIS — C61 Malignant neoplasm of prostate: Secondary | ICD-10-CM | POA: Diagnosis not present

## 2016-08-08 DIAGNOSIS — C61 Malignant neoplasm of prostate: Secondary | ICD-10-CM | POA: Diagnosis not present

## 2016-08-08 DIAGNOSIS — N5201 Erectile dysfunction due to arterial insufficiency: Secondary | ICD-10-CM | POA: Diagnosis not present

## 2016-08-15 ENCOUNTER — Encounter: Payer: Self-pay | Admitting: Radiation Oncology

## 2016-08-30 ENCOUNTER — Ambulatory Visit (INDEPENDENT_AMBULATORY_CARE_PROVIDER_SITE_OTHER): Payer: Medicare Other | Admitting: Pharmacist

## 2016-08-30 DIAGNOSIS — I482 Chronic atrial fibrillation: Secondary | ICD-10-CM | POA: Diagnosis not present

## 2016-08-30 DIAGNOSIS — I4891 Unspecified atrial fibrillation: Secondary | ICD-10-CM | POA: Diagnosis not present

## 2016-08-30 DIAGNOSIS — I4821 Permanent atrial fibrillation: Secondary | ICD-10-CM

## 2016-08-30 DIAGNOSIS — Z5181 Encounter for therapeutic drug level monitoring: Secondary | ICD-10-CM | POA: Diagnosis not present

## 2016-08-30 LAB — POCT INR: INR: 2.5

## 2016-09-01 NOTE — Progress Notes (Signed)
GU Location of Tumor / Histology: prostatic adenocarcinoma  If Prostate Cancer, Gleason Score is (3 + 4) and PSA is (5.85) pre treatment.   08/01/16  PSA 0.17 08/08/16 PSA 0.11  VITO BEG had a robotic prostatectomy on 11/04/2012. Limited focal positive margin noted.    Past/Anticipated interventions by urology, if any: prostatectomy, physical therapy following surgery to improve continence  Past/Anticipated interventions by medical oncology, if any: no  Weight changes, if any: no  Bowel/Bladder complaints, if any: ED and frequency. IPSS 1. Denies dysuria or hematuria. Reports occasional stress incontinence   Nausea/Vomiting, if any: no  Pain issues, if any:  no  SAFETY ISSUES:  Prior radiation? no  Pacemaker/ICD? no  Possible current pregnancy? no  Is the patient on methotrexate? no  Current Complaints / other details:  71 year old male. Married. Smokes 1/2 PPD.

## 2016-09-05 ENCOUNTER — Ambulatory Visit
Admission: RE | Admit: 2016-09-05 | Discharge: 2016-09-05 | Disposition: A | Discharge status: Home / Self Care | Payer: Medicare Other | Source: Ambulatory Visit | Attending: Radiation Oncology | Admitting: Radiation Oncology

## 2016-09-05 ENCOUNTER — Encounter: Payer: Self-pay | Admitting: Radiation Oncology

## 2016-09-05 VITALS — BP 126/98 | HR 71 | Temp 98.0°F | Resp 18 | Ht 74.0 in | Wt 174.8 lb

## 2016-09-05 DIAGNOSIS — C61 Malignant neoplasm of prostate: Secondary | ICD-10-CM | POA: Insufficient documentation

## 2016-09-05 DIAGNOSIS — I10 Essential (primary) hypertension: Secondary | ICD-10-CM | POA: Insufficient documentation

## 2016-09-05 DIAGNOSIS — Z7901 Long term (current) use of anticoagulants: Secondary | ICD-10-CM | POA: Diagnosis not present

## 2016-09-05 DIAGNOSIS — F17210 Nicotine dependence, cigarettes, uncomplicated: Secondary | ICD-10-CM | POA: Diagnosis not present

## 2016-09-05 DIAGNOSIS — Z9079 Acquired absence of other genital organ(s): Secondary | ICD-10-CM | POA: Diagnosis not present

## 2016-09-05 DIAGNOSIS — Z8042 Family history of malignant neoplasm of prostate: Secondary | ICD-10-CM | POA: Insufficient documentation

## 2016-09-05 DIAGNOSIS — Z9889 Other specified postprocedural states: Secondary | ICD-10-CM | POA: Diagnosis not present

## 2016-09-05 DIAGNOSIS — Z510 Encounter for antineoplastic radiation therapy: Secondary | ICD-10-CM | POA: Insufficient documentation

## 2016-09-05 DIAGNOSIS — R9721 Rising PSA following treatment for malignant neoplasm of prostate: Secondary | ICD-10-CM | POA: Diagnosis not present

## 2016-09-05 DIAGNOSIS — I482 Chronic atrial fibrillation: Secondary | ICD-10-CM | POA: Insufficient documentation

## 2016-09-05 DIAGNOSIS — Z8249 Family history of ischemic heart disease and other diseases of the circulatory system: Secondary | ICD-10-CM | POA: Insufficient documentation

## 2016-09-05 DIAGNOSIS — E785 Hyperlipidemia, unspecified: Secondary | ICD-10-CM | POA: Diagnosis not present

## 2016-09-05 DIAGNOSIS — Z85828 Personal history of other malignant neoplasm of skin: Secondary | ICD-10-CM | POA: Insufficient documentation

## 2016-09-05 DIAGNOSIS — Z79899 Other long term (current) drug therapy: Secondary | ICD-10-CM | POA: Insufficient documentation

## 2016-09-05 NOTE — Progress Notes (Signed)
See progress note under physician encounter. 

## 2016-09-05 NOTE — Progress Notes (Signed)
Radiation Oncology         (336) [phone] ________________________________   Initial outpatient Consultation                    "Kyle Kelley" Name: Kyle Kelley MRN: [id number]  Date: 09/05/2016  DOB: 09-Mar-1946  UU:VOZDG,UYQIHK NEVILL, MD  Rana Snare, MD   REFERRING PHYSICIAN: Rana Snare, MD  DIAGNOSIS: 71 yo man with detectable PSA of 0.17 s/p prostatectomy 10/31/12 for stage pT2c Nx M0 adenocarcinoma with a Gleason 3+4 and focal left posterolateral margin involvement    ICD-9-CM ICD-10-CM   1. Malignant neoplasm of prostate ([place]) 185 C61     HISTORY OF PRESENT ILLNESS: Kyle Kelley is a 71 y.o. male seen at the request of Dr. Risa Grill for a dectectable PSA following prostatectomy. In the spring of 2014, Kyle Kelley was found to have an intermediate risk adenocarcinoma of the prostate. He was taken to the operating room on 10/31/12 where he underwent a robotic radical prostatectomy. His final pathology revealed a Gleason's 3+4 cancer with focal involvement of the margin. No lymph nodes were submitted. He has been followed since with an undetectable PSA until March 2018. He had a PSA of 0.17 on 08/01/16, and 0.11 on 08/08/16. He's due to have this repeated today as well. He comes today though to discuss options for salvage radiotherapy.  PREVIOUS RADIATION THERAPY: No  PAST MEDICAL HISTORY:  Past Medical History:  Diagnosis Date  . Dyslipidemia   . Hypertension   . Inguinal hernia 2010   Small asymptomatic L inguinal Hernia  . Permanent atrial fibrillation (HCC)    atrial fib   . Pneumonia    hx of   . Prostate cancer ([place]) 07/2012   s/p prostatectomy  . PSA elevation    workup per Dr. Rana Snare, March 2014  . Sigmoid diverticulosis    on colonoscopy in 2010  . Skin cancer 02/2012   Dr Holley Dexter  . Tobacco dependence    rarely smokes-3 cigs per week      PAST SURGICAL HISTORY: Past Surgical History:  Procedure Laterality Date  . CARDIOVERSION  08/05/2005   Direct Current  Cardioversion  . COLONOSCOPY  2010  . Direct current Cardioversion  08/05/2005  . PROSTATE BIOPSY  09/06/2012   Dr. Rana Snare  . ROBOT ASSISTED LAPAROSCOPIC RADICAL PROSTATECTOMY N/A 10/31/2012   Procedure: ROBOTIC ASSISTED LAPAROSCOPIC RADICAL PROSTATECTOMY;  Surgeon: Bernestine Amass, MD;  Location: [facility];  Service: Urology;  Laterality: N/A;  . TONSILLECTOMY AND ADENOIDECTOMY    . TONSILLECTOMY AND ADENOIDECTOMY      FAMILY HISTORY:  Family History  Problem Relation Age of Onset  . Hypertension Mother   . Heart disease Father   . Hyperlipidemia Father   . Cancer Father     prostate    SOCIAL HISTORY:  Social History   Social History  . Marital status: Married    Spouse name: N/A  . Number of children: N/A  . Years of education: N/A   Occupational History  . Not on file.   Social History Main Topics  . Smoking status: Current Every Day Smoker    Packs/day: 0.50    Years: 50.00    Types: Cigarettes  . Smokeless tobacco: Never Used  . Alcohol use Yes     Comment: rare, heavier in the past  . Drug use: No  . Sexual activity: No   Other Topics Concern  . Not on file   Social History  Narrative   Tobacco Use cigarettes: Current Smoker   Smoking: Yes, less than 1/2 pk per month   Alcohol: Yes - No Alcohol since June 2005   Caffeine: yes   No recreational drug use   Occupation: Employed   Marital Status: Married 85 years, wife has MS   Children: none  The patient is married and lives in [place]. He is retired from working in a maintenance role.  ALLERGIES: Ace inhibitors; Rythmol [propafenone]; and Nitrofurantoin  MEDICATIONS:  Current Outpatient Prescriptions  Medication Sig Dispense Refill  . diltiazem (CARDIZEM CD) 300 MG 24 hr capsule Take 300 mg by mouth daily.     Marland Kitchen EPINEPHrine (EPIPEN IJ) Inject 1 application as directed as needed.    . metoprolol succinate (TOPROL-XL) 25 MG 24 hr tablet TAKE 1 TABLET BY MOUTH EVERY DAY 30 tablet 10  . warfarin  (COUMADIN) 5 MG tablet Take as directed by coumadin clinic 35 tablet 3  . loratadine (CLARITIN) 10 MG tablet Take 10 mg by mouth daily as needed for allergies.     No current facility-administered medications for this encounter.     REVIEW OF SYSTEMS:  On review of systems, the patient reports that he is doing well overall. He denies any chest pain, shortness of breath, cough, fevers, chills, night sweats, unintended weight changes. He denies any bowel disturbances, and denies abdominal pain, nausea or vomiting. The patient's IPSS score is 1 with urinary frequency. he reports erectile dysfunction with performance less than half of most attempts. He denies any new musculoskeletal or joint aches or pains. A complete review of systems is obtained and is otherwise negative.    PHYSICAL EXAM:  Wt Readings from Last 3 Encounters:  09/05/16 174 lb 12.8 oz (79.3 kg)  07/20/16 176 lb 6.4 oz (80 kg)  06/09/15 169 lb 6.4 oz (76.8 kg)   Temp Readings from Last 3 Encounters:  09/05/16 98 F (36.7 C) (Oral)  11/01/12 97.9 F (36.6 C) (Oral)  10/24/12 98.2 F (36.8 C) (Oral)   BP Readings from Last 3 Encounters:  09/05/16 (!) 126/98  07/20/16 138/84  06/09/15 126/70   Pulse Readings from Last 3 Encounters:  09/05/16 71  07/20/16 98  06/09/15 79   Pain Assessment Pain Score: 0-No pain/10  In general this is a well appearing caucasian male in no acute distress.  He is alert and oriented x4 and appropriate throughout the examination. HEENT reveals that the patient is normocephalic, atraumatic.  EOMs are intact. PERRLA. Skin is intact without any evidence of gross lesions.  Cardiovascular exam reveals a regular rate and rhythm, no clicks rubs or murmurs are auscultated. Chest is clear to auscultation bilaterally.  Lymphatic assessment is performed and does not reveal any adenopathy in the cervical, supraclavicular, axillary, or inguinal chains.  Abdomen has active bowel sounds in all quadrants and  is intact.  The abdomen is soft, non tender, non distended.  Lower extremities are negative for pretibial pitting edema, deep calf tenderness, cyanosis or clubbing.   KPS =100  100 - Normal; no complaints; no evidence of disease. 90   - Able to carry on normal activity; minor signs or symptoms of disease. 80   - Normal activity with effort; some signs or symptoms of disease. 89   - Cares for self; unable to carry on normal activity or to do active work. 60   - Requires occasional assistance, but is able to care for most of his personal needs. 50   -  Requires considerable assistance and frequent medical care. 92   - Disabled; requires special care and assistance. 72   - Severely disabled; hospital admission is indicated although death not imminent. 52   - Very sick; hospital admission necessary; active supportive treatment necessary. 10   - Moribund; fatal processes progressing rapidly. 0     - Dead  Karnofsky DA, Abelmann [place], Craver LS and Burchenal [facility] [phone]) The use of the nitrogen mustards in the palliative treatment of carcinoma: with particular reference to bronchogenic carcinoma Cancer 1 634-56  LABORATORY DATA:  Lab Results  Component Value Date   WBC 7.9 10/24/2012   HGB 14.3 11/01/2012   HCT 43.7 11/01/2012   MCV 91.4 10/24/2012   PLT 217 10/24/2012   Lab Results  Component Value Date   NA 137 11/01/2012   K 4.0 11/01/2012   CL 102 11/01/2012   CO2 30 11/01/2012   No results found for: ALT, AST, GGT, ALKPHOS, BILITOT   RADIOGRAPHY: No results found.    IMPRESSION/PLAN: 1. 71 y.o. gentleman with a history of intermediate risk adenocarcinoma of the prostate with a Gleason score of 3+4 s/p prostatectomy, now with a detectable PSA. The patient's pathology and recent PSA levels were reviewed by [place]. We discussed the role for radiotherapy in the salvage setting. We discussed that at this time he would agree with repeating the PSA for confirmation of moving forward and for an  understanding of his baseline levels. We discussed that provided this is still detectable, we would move forward with radiotherapy to the prostatic fossa over 7 1/2 weeks. We reviewed the risks, benefits, short, and long term effects of radiotherapy, and the patient is interested in proceeding. We outlined the rationale for simulation and discussed that we would be in contact with him once we know his PSA levels. The patient states agreement and understanding and we will move forward accordingly.    Carola Rhine, PAC   Seen with     [name], MD [facility] Medical Director and Director of Stereotactic Radiosurgery Direct Dial: [phone]  Fax: [phone] [email or web address]  Skype  LinkedIn

## 2016-09-07 ENCOUNTER — Encounter: Payer: Self-pay | Admitting: Radiation Oncology

## 2016-09-08 ENCOUNTER — Telehealth: Payer: Self-pay | Admitting: Radiation Oncology

## 2016-09-08 NOTE — Telephone Encounter (Signed)
The patient's PSA level was 0.14 on Monday. We would recommend proceeding with radiotherapy and our office will be in touch with the patient to move forward with treatment to the prostatic fossa.

## 2016-09-08 NOTE — Telephone Encounter (Deleted)
-----   Message from Hayden Pedro, [place] sent at 09/05/2016 12:30 PM EDT ----- Regarding: 09/07/16   ----- Message ----- From: Hayden Pedro, PA-C Sent: 09/05/2016  12:24 PM To: Hayden Pedro, PA-C Subject: 09/07/16                                        Check on PSA  level

## 2016-09-15 ENCOUNTER — Ambulatory Visit
Admission: RE | Admit: 2016-09-15 | Discharge: 2016-09-15 | Disposition: A | Discharge status: Home / Self Care | Payer: Medicare Other | Source: Ambulatory Visit | Attending: Radiation Oncology | Admitting: Radiation Oncology

## 2016-09-15 ENCOUNTER — Encounter: Payer: Self-pay | Admitting: Medical Oncology

## 2016-09-15 DIAGNOSIS — I482 Chronic atrial fibrillation: Secondary | ICD-10-CM | POA: Diagnosis not present

## 2016-09-15 DIAGNOSIS — I10 Essential (primary) hypertension: Secondary | ICD-10-CM | POA: Diagnosis not present

## 2016-09-15 DIAGNOSIS — E785 Hyperlipidemia, unspecified: Secondary | ICD-10-CM | POA: Diagnosis not present

## 2016-09-15 DIAGNOSIS — C61 Malignant neoplasm of prostate: Secondary | ICD-10-CM | POA: Diagnosis not present

## 2016-09-15 DIAGNOSIS — Z85828 Personal history of other malignant neoplasm of skin: Secondary | ICD-10-CM | POA: Diagnosis not present

## 2016-09-15 DIAGNOSIS — Z510 Encounter for antineoplastic radiation therapy: Secondary | ICD-10-CM | POA: Diagnosis not present

## 2016-09-15 NOTE — Progress Notes (Signed)
  Radiation Oncology         (336) [phone] ________________________________  Name: Kyle Kelley MRN: [id number]  Date: 09/15/2016  DOB: 04-Feb-1946  SIMULATION AND TREATMENT PLANNING NOTE    ICD-9-CM ICD-10-CM   1. Malignant neoplasm of prostate ([place]) 185 C61     DIAGNOSIS:  71 yo man with detectable PSA of 0.17 s/p prostatectomy 10/31/12 for stage pT2c Nx M0 adenocarcinoma with a Gleason 3+4 and focal left posterolateral margin involvement  NARRATIVE:  The patient was brought to the [facility].  Identity was confirmed.  All relevant records and images related to the planned course of therapy were reviewed.  The patient freely provided informed written consent to proceed with treatment after reviewing the details related to the planned course of therapy. The consent form was witnessed and verified by the simulation staff.  Then, the patient was set-up in a stable reproducible supine position for radiation therapy.  A vacuum lock pillow device was custom fabricated to position his legs in a reproducible immobilized position.  Then, I performed a urethrogram under sterile conditions to identify the prostatic apex.  CT images were obtained.  Surface markings were placed.  The CT images were loaded into the planning software.  Then the prostate target and avoidance structures including the rectum, bladder, bowel and hips were contoured.  Treatment planning then occurred.  The radiation prescription was entered and confirmed.  A total of 1 complex treatment devices were fabricated. I have requested : Intensity Modulated Radiotherapy (IMRT) is medically necessary for this case for the following reason:  Rectal sparing.Marland Kitchen  PLAN:  The patient will receive 68.4 Gy in 38 fractions.  ________________________________  Sheral Apley Tammi Klippel, M.D.  This document serves as a record of services personally performed by [name], MD. It was created on his behalf by Bethann Humble, a trained  medical scribe. The creation of this record is based on the scribe's personal observations and the provider's statements to them. This document has been checked and approved by the attending provider.

## 2016-09-15 NOTE — Progress Notes (Signed)
Met Mr. Hackel and his wife today before his CT simulation. I introduced myself as the navigator and my role. I spent approximately 45 minutes with his wife who was tearful and expressed her sadness of her husband's recurrence.She states they are very close, no children and are both retired. She is thankful for the close observation by Dr. Risa Grill and that he was referred so quickly. I will continue to follow them and gave him my business card and asked them to call me with any questions or concerns.

## 2016-09-22 DIAGNOSIS — Z510 Encounter for antineoplastic radiation therapy: Secondary | ICD-10-CM | POA: Diagnosis not present

## 2016-09-22 DIAGNOSIS — I10 Essential (primary) hypertension: Secondary | ICD-10-CM | POA: Diagnosis not present

## 2016-09-22 DIAGNOSIS — I482 Chronic atrial fibrillation: Secondary | ICD-10-CM | POA: Diagnosis not present

## 2016-09-22 DIAGNOSIS — C61 Malignant neoplasm of prostate: Secondary | ICD-10-CM | POA: Diagnosis not present

## 2016-09-22 DIAGNOSIS — Z85828 Personal history of other malignant neoplasm of skin: Secondary | ICD-10-CM | POA: Diagnosis not present

## 2016-09-22 DIAGNOSIS — E785 Hyperlipidemia, unspecified: Secondary | ICD-10-CM | POA: Diagnosis not present

## 2016-09-26 ENCOUNTER — Ambulatory Visit: Payer: Medicare Other | Admitting: Radiation Oncology

## 2016-09-26 ENCOUNTER — Encounter: Payer: Self-pay | Admitting: Medical Oncology

## 2016-09-26 DIAGNOSIS — Z510 Encounter for antineoplastic radiation therapy: Secondary | ICD-10-CM | POA: Diagnosis not present

## 2016-09-26 DIAGNOSIS — Z85828 Personal history of other malignant neoplasm of skin: Secondary | ICD-10-CM | POA: Diagnosis not present

## 2016-09-26 DIAGNOSIS — E785 Hyperlipidemia, unspecified: Secondary | ICD-10-CM | POA: Diagnosis not present

## 2016-09-26 DIAGNOSIS — C61 Malignant neoplasm of prostate: Secondary | ICD-10-CM | POA: Diagnosis not present

## 2016-09-26 DIAGNOSIS — I482 Chronic atrial fibrillation: Secondary | ICD-10-CM | POA: Diagnosis not present

## 2016-09-26 DIAGNOSIS — I10 Essential (primary) hypertension: Secondary | ICD-10-CM | POA: Diagnosis not present

## 2016-09-26 NOTE — Progress Notes (Signed)
Kyle Kelley here for first radiation treatment. We discussed bladder filling and how this will help with side effects of radiation. Encouraged him and his wife to call me with questions and concerns.

## 2016-09-27 ENCOUNTER — Ambulatory Visit
Admission: RE | Admit: 2016-09-27 | Discharge: 2016-09-27 | Disposition: A | Discharge status: Home / Self Care | Payer: Medicare Other | Source: Ambulatory Visit | Attending: Radiation Oncology | Admitting: Radiation Oncology

## 2016-09-27 DIAGNOSIS — I482 Chronic atrial fibrillation: Secondary | ICD-10-CM | POA: Diagnosis not present

## 2016-09-27 DIAGNOSIS — C61 Malignant neoplasm of prostate: Secondary | ICD-10-CM | POA: Diagnosis not present

## 2016-09-27 DIAGNOSIS — I10 Essential (primary) hypertension: Secondary | ICD-10-CM | POA: Diagnosis not present

## 2016-09-27 DIAGNOSIS — Z85828 Personal history of other malignant neoplasm of skin: Secondary | ICD-10-CM | POA: Diagnosis not present

## 2016-09-27 DIAGNOSIS — E785 Hyperlipidemia, unspecified: Secondary | ICD-10-CM | POA: Diagnosis not present

## 2016-09-27 DIAGNOSIS — Z510 Encounter for antineoplastic radiation therapy: Secondary | ICD-10-CM | POA: Diagnosis not present

## 2016-09-28 ENCOUNTER — Ambulatory Visit
Admission: RE | Admit: 2016-09-28 | Discharge: 2016-09-28 | Disposition: A | Discharge status: Home / Self Care | Payer: Medicare Other | Source: Ambulatory Visit | Attending: Radiation Oncology | Admitting: Radiation Oncology

## 2016-09-28 DIAGNOSIS — E785 Hyperlipidemia, unspecified: Secondary | ICD-10-CM | POA: Diagnosis not present

## 2016-09-28 DIAGNOSIS — Z510 Encounter for antineoplastic radiation therapy: Secondary | ICD-10-CM | POA: Diagnosis not present

## 2016-09-28 DIAGNOSIS — C61 Malignant neoplasm of prostate: Secondary | ICD-10-CM | POA: Diagnosis not present

## 2016-09-28 DIAGNOSIS — Z85828 Personal history of other malignant neoplasm of skin: Secondary | ICD-10-CM | POA: Diagnosis not present

## 2016-09-28 DIAGNOSIS — I10 Essential (primary) hypertension: Secondary | ICD-10-CM | POA: Diagnosis not present

## 2016-09-28 DIAGNOSIS — I482 Chronic atrial fibrillation: Secondary | ICD-10-CM | POA: Diagnosis not present

## 2016-09-29 ENCOUNTER — Ambulatory Visit
Admission: RE | Admit: 2016-09-29 | Discharge: 2016-09-29 | Disposition: A | Discharge status: Home / Self Care | Payer: Medicare Other | Source: Ambulatory Visit | Attending: Radiation Oncology | Admitting: Radiation Oncology

## 2016-09-29 DIAGNOSIS — I10 Essential (primary) hypertension: Secondary | ICD-10-CM | POA: Diagnosis not present

## 2016-09-29 DIAGNOSIS — C61 Malignant neoplasm of prostate: Secondary | ICD-10-CM | POA: Diagnosis not present

## 2016-09-29 DIAGNOSIS — Z510 Encounter for antineoplastic radiation therapy: Secondary | ICD-10-CM | POA: Diagnosis not present

## 2016-09-29 DIAGNOSIS — E785 Hyperlipidemia, unspecified: Secondary | ICD-10-CM | POA: Diagnosis not present

## 2016-09-29 DIAGNOSIS — Z85828 Personal history of other malignant neoplasm of skin: Secondary | ICD-10-CM | POA: Diagnosis not present

## 2016-09-29 DIAGNOSIS — I482 Chronic atrial fibrillation: Secondary | ICD-10-CM | POA: Diagnosis not present

## 2016-09-30 ENCOUNTER — Ambulatory Visit
Admission: RE | Admit: 2016-09-30 | Discharge: 2016-09-30 | Disposition: A | Discharge status: Home / Self Care | Payer: Medicare Other | Source: Ambulatory Visit | Attending: Radiation Oncology | Admitting: Radiation Oncology

## 2016-09-30 DIAGNOSIS — I482 Chronic atrial fibrillation: Secondary | ICD-10-CM | POA: Diagnosis not present

## 2016-09-30 DIAGNOSIS — Z85828 Personal history of other malignant neoplasm of skin: Secondary | ICD-10-CM | POA: Diagnosis not present

## 2016-09-30 DIAGNOSIS — I10 Essential (primary) hypertension: Secondary | ICD-10-CM | POA: Diagnosis not present

## 2016-09-30 DIAGNOSIS — Z510 Encounter for antineoplastic radiation therapy: Secondary | ICD-10-CM | POA: Diagnosis not present

## 2016-09-30 DIAGNOSIS — E785 Hyperlipidemia, unspecified: Secondary | ICD-10-CM | POA: Diagnosis not present

## 2016-09-30 DIAGNOSIS — C61 Malignant neoplasm of prostate: Secondary | ICD-10-CM | POA: Diagnosis not present

## 2016-10-03 ENCOUNTER — Ambulatory Visit
Admission: RE | Admit: 2016-10-03 | Discharge: 2016-10-03 | Disposition: A | Discharge status: Home / Self Care | Payer: Medicare Other | Source: Ambulatory Visit | Attending: Radiation Oncology | Admitting: Radiation Oncology

## 2016-10-03 DIAGNOSIS — C61 Malignant neoplasm of prostate: Secondary | ICD-10-CM | POA: Diagnosis not present

## 2016-10-03 DIAGNOSIS — Z510 Encounter for antineoplastic radiation therapy: Secondary | ICD-10-CM | POA: Diagnosis not present

## 2016-10-03 DIAGNOSIS — Z85828 Personal history of other malignant neoplasm of skin: Secondary | ICD-10-CM | POA: Diagnosis not present

## 2016-10-03 DIAGNOSIS — I10 Essential (primary) hypertension: Secondary | ICD-10-CM | POA: Diagnosis not present

## 2016-10-03 DIAGNOSIS — E785 Hyperlipidemia, unspecified: Secondary | ICD-10-CM | POA: Diagnosis not present

## 2016-10-03 DIAGNOSIS — I482 Chronic atrial fibrillation: Secondary | ICD-10-CM | POA: Diagnosis not present

## 2016-10-04 ENCOUNTER — Ambulatory Visit
Admission: RE | Admit: 2016-10-04 | Discharge: 2016-10-04 | Disposition: A | Discharge status: Home / Self Care | Payer: Medicare Other | Source: Ambulatory Visit | Attending: Radiation Oncology | Admitting: Radiation Oncology

## 2016-10-04 DIAGNOSIS — I482 Chronic atrial fibrillation: Secondary | ICD-10-CM | POA: Diagnosis not present

## 2016-10-04 DIAGNOSIS — Z510 Encounter for antineoplastic radiation therapy: Secondary | ICD-10-CM | POA: Diagnosis not present

## 2016-10-04 DIAGNOSIS — Z85828 Personal history of other malignant neoplasm of skin: Secondary | ICD-10-CM | POA: Diagnosis not present

## 2016-10-04 DIAGNOSIS — I10 Essential (primary) hypertension: Secondary | ICD-10-CM | POA: Diagnosis not present

## 2016-10-04 DIAGNOSIS — C61 Malignant neoplasm of prostate: Secondary | ICD-10-CM | POA: Diagnosis not present

## 2016-10-04 DIAGNOSIS — E785 Hyperlipidemia, unspecified: Secondary | ICD-10-CM | POA: Diagnosis not present

## 2016-10-05 ENCOUNTER — Ambulatory Visit
Admission: RE | Admit: 2016-10-05 | Discharge: 2016-10-05 | Disposition: A | Discharge status: Home / Self Care | Payer: Medicare Other | Source: Ambulatory Visit | Attending: Radiation Oncology | Admitting: Radiation Oncology

## 2016-10-05 DIAGNOSIS — I10 Essential (primary) hypertension: Secondary | ICD-10-CM | POA: Diagnosis not present

## 2016-10-05 DIAGNOSIS — Z85828 Personal history of other malignant neoplasm of skin: Secondary | ICD-10-CM | POA: Diagnosis not present

## 2016-10-05 DIAGNOSIS — Z510 Encounter for antineoplastic radiation therapy: Secondary | ICD-10-CM | POA: Diagnosis not present

## 2016-10-05 DIAGNOSIS — I482 Chronic atrial fibrillation: Secondary | ICD-10-CM | POA: Diagnosis not present

## 2016-10-05 DIAGNOSIS — E785 Hyperlipidemia, unspecified: Secondary | ICD-10-CM | POA: Diagnosis not present

## 2016-10-05 DIAGNOSIS — C61 Malignant neoplasm of prostate: Secondary | ICD-10-CM | POA: Diagnosis not present

## 2016-10-06 ENCOUNTER — Ambulatory Visit
Admission: RE | Admit: 2016-10-06 | Discharge: 2016-10-06 | Disposition: A | Discharge status: Home / Self Care | Payer: Medicare Other | Source: Ambulatory Visit | Attending: Radiation Oncology | Admitting: Radiation Oncology

## 2016-10-06 DIAGNOSIS — C61 Malignant neoplasm of prostate: Secondary | ICD-10-CM | POA: Diagnosis not present

## 2016-10-06 DIAGNOSIS — I482 Chronic atrial fibrillation: Secondary | ICD-10-CM | POA: Diagnosis not present

## 2016-10-06 DIAGNOSIS — Z510 Encounter for antineoplastic radiation therapy: Secondary | ICD-10-CM | POA: Diagnosis not present

## 2016-10-06 DIAGNOSIS — E785 Hyperlipidemia, unspecified: Secondary | ICD-10-CM | POA: Diagnosis not present

## 2016-10-06 DIAGNOSIS — Z85828 Personal history of other malignant neoplasm of skin: Secondary | ICD-10-CM | POA: Diagnosis not present

## 2016-10-06 DIAGNOSIS — I10 Essential (primary) hypertension: Secondary | ICD-10-CM | POA: Diagnosis not present

## 2016-10-07 ENCOUNTER — Ambulatory Visit
Admission: RE | Admit: 2016-10-07 | Discharge: 2016-10-07 | Disposition: A | Discharge status: Home / Self Care | Payer: Medicare Other | Source: Ambulatory Visit | Attending: Radiation Oncology | Admitting: Radiation Oncology

## 2016-10-07 DIAGNOSIS — I10 Essential (primary) hypertension: Secondary | ICD-10-CM | POA: Diagnosis not present

## 2016-10-07 DIAGNOSIS — E785 Hyperlipidemia, unspecified: Secondary | ICD-10-CM | POA: Diagnosis not present

## 2016-10-07 DIAGNOSIS — Z85828 Personal history of other malignant neoplasm of skin: Secondary | ICD-10-CM | POA: Diagnosis not present

## 2016-10-07 DIAGNOSIS — I482 Chronic atrial fibrillation: Secondary | ICD-10-CM | POA: Diagnosis not present

## 2016-10-07 DIAGNOSIS — C61 Malignant neoplasm of prostate: Secondary | ICD-10-CM | POA: Diagnosis not present

## 2016-10-07 DIAGNOSIS — Z510 Encounter for antineoplastic radiation therapy: Secondary | ICD-10-CM | POA: Diagnosis not present

## 2016-10-10 ENCOUNTER — Ambulatory Visit
Admission: RE | Admit: 2016-10-10 | Discharge: 2016-10-10 | Disposition: A | Discharge status: Home / Self Care | Payer: Medicare Other | Source: Ambulatory Visit | Attending: Radiation Oncology | Admitting: Radiation Oncology

## 2016-10-10 DIAGNOSIS — Z510 Encounter for antineoplastic radiation therapy: Secondary | ICD-10-CM | POA: Diagnosis not present

## 2016-10-10 DIAGNOSIS — C61 Malignant neoplasm of prostate: Secondary | ICD-10-CM | POA: Diagnosis not present

## 2016-10-10 DIAGNOSIS — E785 Hyperlipidemia, unspecified: Secondary | ICD-10-CM | POA: Diagnosis not present

## 2016-10-10 DIAGNOSIS — Z85828 Personal history of other malignant neoplasm of skin: Secondary | ICD-10-CM | POA: Diagnosis not present

## 2016-10-10 DIAGNOSIS — I10 Essential (primary) hypertension: Secondary | ICD-10-CM | POA: Diagnosis not present

## 2016-10-10 DIAGNOSIS — I482 Chronic atrial fibrillation: Secondary | ICD-10-CM | POA: Diagnosis not present

## 2016-10-11 ENCOUNTER — Ambulatory Visit
Admission: RE | Admit: 2016-10-11 | Discharge: 2016-10-11 | Disposition: A | Discharge status: Home / Self Care | Payer: Medicare Other | Source: Ambulatory Visit | Attending: Radiation Oncology | Admitting: Radiation Oncology

## 2016-10-11 DIAGNOSIS — D225 Melanocytic nevi of trunk: Secondary | ICD-10-CM | POA: Diagnosis not present

## 2016-10-11 DIAGNOSIS — Z510 Encounter for antineoplastic radiation therapy: Secondary | ICD-10-CM | POA: Diagnosis not present

## 2016-10-11 DIAGNOSIS — L821 Other seborrheic keratosis: Secondary | ICD-10-CM | POA: Diagnosis not present

## 2016-10-11 DIAGNOSIS — Z85828 Personal history of other malignant neoplasm of skin: Secondary | ICD-10-CM | POA: Diagnosis not present

## 2016-10-11 DIAGNOSIS — C61 Malignant neoplasm of prostate: Secondary | ICD-10-CM | POA: Diagnosis not present

## 2016-10-11 DIAGNOSIS — I482 Chronic atrial fibrillation: Secondary | ICD-10-CM | POA: Diagnosis not present

## 2016-10-11 DIAGNOSIS — E785 Hyperlipidemia, unspecified: Secondary | ICD-10-CM | POA: Diagnosis not present

## 2016-10-11 DIAGNOSIS — I10 Essential (primary) hypertension: Secondary | ICD-10-CM | POA: Diagnosis not present

## 2016-10-12 ENCOUNTER — Ambulatory Visit (INDEPENDENT_AMBULATORY_CARE_PROVIDER_SITE_OTHER): Payer: Medicare Other | Admitting: *Deleted

## 2016-10-12 ENCOUNTER — Ambulatory Visit
Admission: RE | Admit: 2016-10-12 | Discharge: 2016-10-12 | Disposition: A | Discharge status: Home / Self Care | Payer: Medicare Other | Source: Ambulatory Visit | Attending: Radiation Oncology | Admitting: Radiation Oncology

## 2016-10-12 DIAGNOSIS — Z5181 Encounter for therapeutic drug level monitoring: Secondary | ICD-10-CM

## 2016-10-12 DIAGNOSIS — I482 Chronic atrial fibrillation: Secondary | ICD-10-CM

## 2016-10-12 DIAGNOSIS — Z85828 Personal history of other malignant neoplasm of skin: Secondary | ICD-10-CM | POA: Diagnosis not present

## 2016-10-12 DIAGNOSIS — I10 Essential (primary) hypertension: Secondary | ICD-10-CM | POA: Diagnosis not present

## 2016-10-12 DIAGNOSIS — Z510 Encounter for antineoplastic radiation therapy: Secondary | ICD-10-CM | POA: Diagnosis not present

## 2016-10-12 DIAGNOSIS — C61 Malignant neoplasm of prostate: Secondary | ICD-10-CM | POA: Diagnosis not present

## 2016-10-12 DIAGNOSIS — I4821 Permanent atrial fibrillation: Secondary | ICD-10-CM

## 2016-10-12 DIAGNOSIS — I4891 Unspecified atrial fibrillation: Secondary | ICD-10-CM

## 2016-10-12 DIAGNOSIS — E785 Hyperlipidemia, unspecified: Secondary | ICD-10-CM | POA: Diagnosis not present

## 2016-10-12 LAB — POCT INR: INR: 2.2

## 2016-10-13 ENCOUNTER — Ambulatory Visit
Admission: RE | Admit: 2016-10-13 | Discharge: 2016-10-13 | Disposition: A | Discharge status: Home / Self Care | Payer: Medicare Other | Source: Ambulatory Visit | Attending: Radiation Oncology | Admitting: Radiation Oncology

## 2016-10-13 DIAGNOSIS — I10 Essential (primary) hypertension: Secondary | ICD-10-CM | POA: Diagnosis not present

## 2016-10-13 DIAGNOSIS — I482 Chronic atrial fibrillation: Secondary | ICD-10-CM | POA: Diagnosis not present

## 2016-10-13 DIAGNOSIS — Z510 Encounter for antineoplastic radiation therapy: Secondary | ICD-10-CM | POA: Diagnosis not present

## 2016-10-13 DIAGNOSIS — C61 Malignant neoplasm of prostate: Secondary | ICD-10-CM | POA: Diagnosis not present

## 2016-10-13 DIAGNOSIS — E785 Hyperlipidemia, unspecified: Secondary | ICD-10-CM | POA: Diagnosis not present

## 2016-10-13 DIAGNOSIS — Z85828 Personal history of other malignant neoplasm of skin: Secondary | ICD-10-CM | POA: Diagnosis not present

## 2016-10-14 ENCOUNTER — Ambulatory Visit
Admission: RE | Admit: 2016-10-14 | Discharge: 2016-10-14 | Disposition: A | Discharge status: Home / Self Care | Payer: Medicare Other | Source: Ambulatory Visit | Attending: Radiation Oncology | Admitting: Radiation Oncology

## 2016-10-14 DIAGNOSIS — E785 Hyperlipidemia, unspecified: Secondary | ICD-10-CM | POA: Diagnosis not present

## 2016-10-14 DIAGNOSIS — Z510 Encounter for antineoplastic radiation therapy: Secondary | ICD-10-CM | POA: Diagnosis not present

## 2016-10-14 DIAGNOSIS — I10 Essential (primary) hypertension: Secondary | ICD-10-CM | POA: Diagnosis not present

## 2016-10-14 DIAGNOSIS — Z85828 Personal history of other malignant neoplasm of skin: Secondary | ICD-10-CM | POA: Diagnosis not present

## 2016-10-14 DIAGNOSIS — C61 Malignant neoplasm of prostate: Secondary | ICD-10-CM | POA: Diagnosis not present

## 2016-10-14 DIAGNOSIS — I482 Chronic atrial fibrillation: Secondary | ICD-10-CM | POA: Diagnosis not present

## 2016-10-17 ENCOUNTER — Ambulatory Visit
Admission: RE | Admit: 2016-10-17 | Discharge: 2016-10-17 | Disposition: A | Discharge status: Home / Self Care | Payer: Medicare Other | Source: Ambulatory Visit | Attending: Radiation Oncology | Admitting: Radiation Oncology

## 2016-10-17 DIAGNOSIS — Z510 Encounter for antineoplastic radiation therapy: Secondary | ICD-10-CM | POA: Diagnosis not present

## 2016-10-17 DIAGNOSIS — I10 Essential (primary) hypertension: Secondary | ICD-10-CM | POA: Diagnosis not present

## 2016-10-17 DIAGNOSIS — Z85828 Personal history of other malignant neoplasm of skin: Secondary | ICD-10-CM | POA: Diagnosis not present

## 2016-10-17 DIAGNOSIS — I482 Chronic atrial fibrillation: Secondary | ICD-10-CM | POA: Diagnosis not present

## 2016-10-17 DIAGNOSIS — E785 Hyperlipidemia, unspecified: Secondary | ICD-10-CM | POA: Diagnosis not present

## 2016-10-17 DIAGNOSIS — C61 Malignant neoplasm of prostate: Secondary | ICD-10-CM | POA: Diagnosis not present

## 2016-10-18 ENCOUNTER — Ambulatory Visit
Admission: RE | Admit: 2016-10-18 | Discharge: 2016-10-18 | Disposition: A | Discharge status: Home / Self Care | Payer: Medicare Other | Source: Ambulatory Visit | Attending: Radiation Oncology | Admitting: Radiation Oncology

## 2016-10-18 DIAGNOSIS — Z85828 Personal history of other malignant neoplasm of skin: Secondary | ICD-10-CM | POA: Diagnosis not present

## 2016-10-18 DIAGNOSIS — Z510 Encounter for antineoplastic radiation therapy: Secondary | ICD-10-CM | POA: Diagnosis not present

## 2016-10-18 DIAGNOSIS — E785 Hyperlipidemia, unspecified: Secondary | ICD-10-CM | POA: Diagnosis not present

## 2016-10-18 DIAGNOSIS — I482 Chronic atrial fibrillation: Secondary | ICD-10-CM | POA: Diagnosis not present

## 2016-10-18 DIAGNOSIS — I10 Essential (primary) hypertension: Secondary | ICD-10-CM | POA: Diagnosis not present

## 2016-10-18 DIAGNOSIS — C61 Malignant neoplasm of prostate: Secondary | ICD-10-CM | POA: Diagnosis not present

## 2016-10-19 ENCOUNTER — Ambulatory Visit
Admission: RE | Admit: 2016-10-19 | Discharge: 2016-10-19 | Disposition: A | Discharge status: Home / Self Care | Payer: Medicare Other | Source: Ambulatory Visit | Attending: Radiation Oncology | Admitting: Radiation Oncology

## 2016-10-19 DIAGNOSIS — I10 Essential (primary) hypertension: Secondary | ICD-10-CM | POA: Diagnosis not present

## 2016-10-19 DIAGNOSIS — C61 Malignant neoplasm of prostate: Secondary | ICD-10-CM | POA: Diagnosis not present

## 2016-10-19 DIAGNOSIS — Z510 Encounter for antineoplastic radiation therapy: Secondary | ICD-10-CM | POA: Diagnosis not present

## 2016-10-19 DIAGNOSIS — Z85828 Personal history of other malignant neoplasm of skin: Secondary | ICD-10-CM | POA: Diagnosis not present

## 2016-10-19 DIAGNOSIS — I482 Chronic atrial fibrillation: Secondary | ICD-10-CM | POA: Diagnosis not present

## 2016-10-19 DIAGNOSIS — E785 Hyperlipidemia, unspecified: Secondary | ICD-10-CM | POA: Diagnosis not present

## 2016-10-20 ENCOUNTER — Ambulatory Visit
Admission: RE | Admit: 2016-10-20 | Discharge: 2016-10-20 | Disposition: A | Discharge status: Home / Self Care | Payer: Medicare Other | Source: Ambulatory Visit | Attending: Radiation Oncology | Admitting: Radiation Oncology

## 2016-10-20 DIAGNOSIS — Z510 Encounter for antineoplastic radiation therapy: Secondary | ICD-10-CM | POA: Diagnosis not present

## 2016-10-20 DIAGNOSIS — E785 Hyperlipidemia, unspecified: Secondary | ICD-10-CM | POA: Diagnosis not present

## 2016-10-20 DIAGNOSIS — I482 Chronic atrial fibrillation: Secondary | ICD-10-CM | POA: Diagnosis not present

## 2016-10-20 DIAGNOSIS — Z85828 Personal history of other malignant neoplasm of skin: Secondary | ICD-10-CM | POA: Diagnosis not present

## 2016-10-20 DIAGNOSIS — I10 Essential (primary) hypertension: Secondary | ICD-10-CM | POA: Diagnosis not present

## 2016-10-20 DIAGNOSIS — C61 Malignant neoplasm of prostate: Secondary | ICD-10-CM | POA: Diagnosis not present

## 2016-10-21 ENCOUNTER — Ambulatory Visit
Admission: RE | Admit: 2016-10-21 | Discharge: 2016-10-21 | Disposition: A | Discharge status: Home / Self Care | Payer: Medicare Other | Source: Ambulatory Visit | Attending: Radiation Oncology | Admitting: Radiation Oncology

## 2016-10-21 DIAGNOSIS — Z510 Encounter for antineoplastic radiation therapy: Secondary | ICD-10-CM | POA: Diagnosis not present

## 2016-10-21 DIAGNOSIS — Z85828 Personal history of other malignant neoplasm of skin: Secondary | ICD-10-CM | POA: Diagnosis not present

## 2016-10-21 DIAGNOSIS — I482 Chronic atrial fibrillation: Secondary | ICD-10-CM | POA: Diagnosis not present

## 2016-10-21 DIAGNOSIS — C61 Malignant neoplasm of prostate: Secondary | ICD-10-CM | POA: Diagnosis not present

## 2016-10-21 DIAGNOSIS — I10 Essential (primary) hypertension: Secondary | ICD-10-CM | POA: Diagnosis not present

## 2016-10-21 DIAGNOSIS — E785 Hyperlipidemia, unspecified: Secondary | ICD-10-CM | POA: Diagnosis not present

## 2016-10-25 ENCOUNTER — Ambulatory Visit
Admission: RE | Admit: 2016-10-25 | Discharge: 2016-10-25 | Disposition: A | Discharge status: Home / Self Care | Payer: Medicare Other | Source: Ambulatory Visit | Attending: Radiation Oncology | Admitting: Radiation Oncology

## 2016-10-25 DIAGNOSIS — E785 Hyperlipidemia, unspecified: Secondary | ICD-10-CM | POA: Diagnosis not present

## 2016-10-25 DIAGNOSIS — Z510 Encounter for antineoplastic radiation therapy: Secondary | ICD-10-CM | POA: Diagnosis not present

## 2016-10-25 DIAGNOSIS — I482 Chronic atrial fibrillation: Secondary | ICD-10-CM | POA: Diagnosis not present

## 2016-10-25 DIAGNOSIS — I10 Essential (primary) hypertension: Secondary | ICD-10-CM | POA: Diagnosis not present

## 2016-10-25 DIAGNOSIS — Z85828 Personal history of other malignant neoplasm of skin: Secondary | ICD-10-CM | POA: Diagnosis not present

## 2016-10-25 DIAGNOSIS — C61 Malignant neoplasm of prostate: Secondary | ICD-10-CM | POA: Diagnosis not present

## 2016-10-26 ENCOUNTER — Ambulatory Visit (INDEPENDENT_AMBULATORY_CARE_PROVIDER_SITE_OTHER): Payer: Medicare Other | Admitting: *Deleted

## 2016-10-26 ENCOUNTER — Ambulatory Visit
Admission: RE | Admit: 2016-10-26 | Discharge: 2016-10-26 | Disposition: A | Discharge status: Home / Self Care | Payer: Medicare Other | Source: Ambulatory Visit | Attending: Radiation Oncology | Admitting: Radiation Oncology

## 2016-10-26 DIAGNOSIS — Z85828 Personal history of other malignant neoplasm of skin: Secondary | ICD-10-CM | POA: Diagnosis not present

## 2016-10-26 DIAGNOSIS — E785 Hyperlipidemia, unspecified: Secondary | ICD-10-CM | POA: Diagnosis not present

## 2016-10-26 DIAGNOSIS — I482 Chronic atrial fibrillation: Secondary | ICD-10-CM

## 2016-10-26 DIAGNOSIS — I4891 Unspecified atrial fibrillation: Secondary | ICD-10-CM | POA: Diagnosis not present

## 2016-10-26 DIAGNOSIS — Z510 Encounter for antineoplastic radiation therapy: Secondary | ICD-10-CM | POA: Diagnosis not present

## 2016-10-26 DIAGNOSIS — I10 Essential (primary) hypertension: Secondary | ICD-10-CM | POA: Diagnosis not present

## 2016-10-26 DIAGNOSIS — Z5181 Encounter for therapeutic drug level monitoring: Secondary | ICD-10-CM

## 2016-10-26 DIAGNOSIS — I4821 Permanent atrial fibrillation: Secondary | ICD-10-CM

## 2016-10-26 DIAGNOSIS — C61 Malignant neoplasm of prostate: Secondary | ICD-10-CM | POA: Diagnosis not present

## 2016-10-26 LAB — POCT INR: INR: 2.1

## 2016-10-27 ENCOUNTER — Other Ambulatory Visit: Payer: Self-pay | Admitting: Cardiology

## 2016-10-27 ENCOUNTER — Ambulatory Visit
Admission: RE | Admit: 2016-10-27 | Discharge: 2016-10-27 | Disposition: A | Discharge status: Home / Self Care | Payer: Medicare Other | Source: Ambulatory Visit | Attending: Radiation Oncology | Admitting: Radiation Oncology

## 2016-10-27 DIAGNOSIS — C61 Malignant neoplasm of prostate: Secondary | ICD-10-CM | POA: Diagnosis not present

## 2016-10-27 DIAGNOSIS — Z85828 Personal history of other malignant neoplasm of skin: Secondary | ICD-10-CM | POA: Diagnosis not present

## 2016-10-27 DIAGNOSIS — I482 Chronic atrial fibrillation: Secondary | ICD-10-CM | POA: Diagnosis not present

## 2016-10-27 DIAGNOSIS — E785 Hyperlipidemia, unspecified: Secondary | ICD-10-CM | POA: Diagnosis not present

## 2016-10-27 DIAGNOSIS — I10 Essential (primary) hypertension: Secondary | ICD-10-CM | POA: Diagnosis not present

## 2016-10-27 DIAGNOSIS — Z510 Encounter for antineoplastic radiation therapy: Secondary | ICD-10-CM | POA: Diagnosis not present

## 2016-10-28 ENCOUNTER — Encounter: Payer: Self-pay | Admitting: Medical Oncology

## 2016-10-28 ENCOUNTER — Ambulatory Visit
Admission: RE | Admit: 2016-10-28 | Discharge: 2016-10-28 | Disposition: A | Discharge status: Home / Self Care | Payer: Medicare Other | Source: Ambulatory Visit | Attending: Radiation Oncology | Admitting: Radiation Oncology

## 2016-10-28 DIAGNOSIS — Z85828 Personal history of other malignant neoplasm of skin: Secondary | ICD-10-CM | POA: Diagnosis not present

## 2016-10-28 DIAGNOSIS — I482 Chronic atrial fibrillation: Secondary | ICD-10-CM | POA: Diagnosis not present

## 2016-10-28 DIAGNOSIS — Z510 Encounter for antineoplastic radiation therapy: Secondary | ICD-10-CM | POA: Diagnosis not present

## 2016-10-28 DIAGNOSIS — C61 Malignant neoplasm of prostate: Secondary | ICD-10-CM | POA: Diagnosis not present

## 2016-10-28 DIAGNOSIS — E785 Hyperlipidemia, unspecified: Secondary | ICD-10-CM | POA: Diagnosis not present

## 2016-10-28 DIAGNOSIS — I10 Essential (primary) hypertension: Secondary | ICD-10-CM | POA: Diagnosis not present

## 2016-10-28 NOTE — Progress Notes (Signed)
Mr. Kyle Kelley states he is doing well with radiation treatments. Joyce-wife states the treatment time has flown. He will complete radiation 11/17/16 and they are hoping to take a trip to the beach to celebrate. I will continue to follow and asked them to call me with questions or concerns.

## 2016-10-31 ENCOUNTER — Ambulatory Visit
Admission: RE | Admit: 2016-10-31 | Discharge: 2016-10-31 | Disposition: A | Discharge status: Home / Self Care | Payer: Medicare Other | Source: Ambulatory Visit | Attending: Radiation Oncology | Admitting: Radiation Oncology

## 2016-10-31 DIAGNOSIS — I10 Essential (primary) hypertension: Secondary | ICD-10-CM | POA: Diagnosis not present

## 2016-10-31 DIAGNOSIS — I482 Chronic atrial fibrillation: Secondary | ICD-10-CM | POA: Diagnosis not present

## 2016-10-31 DIAGNOSIS — Z510 Encounter for antineoplastic radiation therapy: Secondary | ICD-10-CM | POA: Diagnosis not present

## 2016-10-31 DIAGNOSIS — Z85828 Personal history of other malignant neoplasm of skin: Secondary | ICD-10-CM | POA: Diagnosis not present

## 2016-10-31 DIAGNOSIS — C61 Malignant neoplasm of prostate: Secondary | ICD-10-CM | POA: Diagnosis not present

## 2016-10-31 DIAGNOSIS — E785 Hyperlipidemia, unspecified: Secondary | ICD-10-CM | POA: Diagnosis not present

## 2016-11-01 ENCOUNTER — Ambulatory Visit
Admission: RE | Admit: 2016-11-01 | Discharge: 2016-11-01 | Disposition: A | Discharge status: Home / Self Care | Payer: Medicare Other | Source: Ambulatory Visit | Attending: Radiation Oncology | Admitting: Radiation Oncology

## 2016-11-01 DIAGNOSIS — E785 Hyperlipidemia, unspecified: Secondary | ICD-10-CM | POA: Diagnosis not present

## 2016-11-01 DIAGNOSIS — Z85828 Personal history of other malignant neoplasm of skin: Secondary | ICD-10-CM | POA: Diagnosis not present

## 2016-11-01 DIAGNOSIS — I10 Essential (primary) hypertension: Secondary | ICD-10-CM | POA: Diagnosis not present

## 2016-11-01 DIAGNOSIS — I482 Chronic atrial fibrillation: Secondary | ICD-10-CM | POA: Diagnosis not present

## 2016-11-01 DIAGNOSIS — C61 Malignant neoplasm of prostate: Secondary | ICD-10-CM | POA: Diagnosis not present

## 2016-11-01 DIAGNOSIS — Z510 Encounter for antineoplastic radiation therapy: Secondary | ICD-10-CM | POA: Diagnosis not present

## 2016-11-02 ENCOUNTER — Ambulatory Visit
Admission: RE | Admit: 2016-11-02 | Discharge: 2016-11-02 | Disposition: A | Discharge status: Home / Self Care | Payer: Medicare Other | Source: Ambulatory Visit | Attending: Radiation Oncology | Admitting: Radiation Oncology

## 2016-11-02 DIAGNOSIS — I10 Essential (primary) hypertension: Secondary | ICD-10-CM | POA: Diagnosis not present

## 2016-11-02 DIAGNOSIS — C61 Malignant neoplasm of prostate: Secondary | ICD-10-CM | POA: Diagnosis not present

## 2016-11-02 DIAGNOSIS — Z85828 Personal history of other malignant neoplasm of skin: Secondary | ICD-10-CM | POA: Diagnosis not present

## 2016-11-02 DIAGNOSIS — E785 Hyperlipidemia, unspecified: Secondary | ICD-10-CM | POA: Diagnosis not present

## 2016-11-02 DIAGNOSIS — Z510 Encounter for antineoplastic radiation therapy: Secondary | ICD-10-CM | POA: Diagnosis not present

## 2016-11-02 DIAGNOSIS — I482 Chronic atrial fibrillation: Secondary | ICD-10-CM | POA: Diagnosis not present

## 2016-11-03 ENCOUNTER — Ambulatory Visit: Payer: Medicare Other

## 2016-11-04 ENCOUNTER — Ambulatory Visit
Admission: RE | Admit: 2016-11-04 | Discharge: 2016-11-04 | Disposition: A | Discharge status: Home / Self Care | Payer: Medicare Other | Source: Ambulatory Visit | Attending: Radiation Oncology | Admitting: Radiation Oncology

## 2016-11-04 DIAGNOSIS — Z85828 Personal history of other malignant neoplasm of skin: Secondary | ICD-10-CM | POA: Diagnosis not present

## 2016-11-04 DIAGNOSIS — I482 Chronic atrial fibrillation: Secondary | ICD-10-CM | POA: Diagnosis not present

## 2016-11-04 DIAGNOSIS — I10 Essential (primary) hypertension: Secondary | ICD-10-CM | POA: Diagnosis not present

## 2016-11-04 DIAGNOSIS — Z510 Encounter for antineoplastic radiation therapy: Secondary | ICD-10-CM | POA: Diagnosis not present

## 2016-11-04 DIAGNOSIS — E785 Hyperlipidemia, unspecified: Secondary | ICD-10-CM | POA: Diagnosis not present

## 2016-11-04 DIAGNOSIS — C61 Malignant neoplasm of prostate: Secondary | ICD-10-CM | POA: Diagnosis not present

## 2016-11-07 ENCOUNTER — Ambulatory Visit
Admission: RE | Admit: 2016-11-07 | Discharge: 2016-11-07 | Disposition: A | Discharge status: Home / Self Care | Payer: Medicare Other | Source: Ambulatory Visit | Attending: Radiation Oncology | Admitting: Radiation Oncology

## 2016-11-07 DIAGNOSIS — C61 Malignant neoplasm of prostate: Secondary | ICD-10-CM | POA: Diagnosis not present

## 2016-11-07 DIAGNOSIS — E785 Hyperlipidemia, unspecified: Secondary | ICD-10-CM | POA: Diagnosis not present

## 2016-11-07 DIAGNOSIS — I482 Chronic atrial fibrillation: Secondary | ICD-10-CM | POA: Diagnosis not present

## 2016-11-07 DIAGNOSIS — I10 Essential (primary) hypertension: Secondary | ICD-10-CM | POA: Diagnosis not present

## 2016-11-07 DIAGNOSIS — Z85828 Personal history of other malignant neoplasm of skin: Secondary | ICD-10-CM | POA: Diagnosis not present

## 2016-11-07 DIAGNOSIS — Z510 Encounter for antineoplastic radiation therapy: Secondary | ICD-10-CM | POA: Diagnosis not present

## 2016-11-08 ENCOUNTER — Ambulatory Visit
Admission: RE | Admit: 2016-11-08 | Discharge: 2016-11-08 | Disposition: A | Discharge status: Home / Self Care | Payer: Medicare Other | Source: Ambulatory Visit | Attending: Radiation Oncology | Admitting: Radiation Oncology

## 2016-11-08 DIAGNOSIS — Z85828 Personal history of other malignant neoplasm of skin: Secondary | ICD-10-CM | POA: Diagnosis not present

## 2016-11-08 DIAGNOSIS — I10 Essential (primary) hypertension: Secondary | ICD-10-CM | POA: Diagnosis not present

## 2016-11-08 DIAGNOSIS — E785 Hyperlipidemia, unspecified: Secondary | ICD-10-CM | POA: Diagnosis not present

## 2016-11-08 DIAGNOSIS — Z510 Encounter for antineoplastic radiation therapy: Secondary | ICD-10-CM | POA: Diagnosis not present

## 2016-11-08 DIAGNOSIS — I482 Chronic atrial fibrillation: Secondary | ICD-10-CM | POA: Diagnosis not present

## 2016-11-08 DIAGNOSIS — C61 Malignant neoplasm of prostate: Secondary | ICD-10-CM | POA: Diagnosis not present

## 2016-11-09 ENCOUNTER — Ambulatory Visit
Admission: RE | Admit: 2016-11-09 | Discharge: 2016-11-09 | Disposition: A | Discharge status: Home / Self Care | Payer: Medicare Other | Source: Ambulatory Visit | Attending: Radiation Oncology | Admitting: Radiation Oncology

## 2016-11-09 DIAGNOSIS — Z85828 Personal history of other malignant neoplasm of skin: Secondary | ICD-10-CM | POA: Diagnosis not present

## 2016-11-09 DIAGNOSIS — I482 Chronic atrial fibrillation: Secondary | ICD-10-CM | POA: Diagnosis not present

## 2016-11-09 DIAGNOSIS — I10 Essential (primary) hypertension: Secondary | ICD-10-CM | POA: Diagnosis not present

## 2016-11-09 DIAGNOSIS — C61 Malignant neoplasm of prostate: Secondary | ICD-10-CM

## 2016-11-09 DIAGNOSIS — Z510 Encounter for antineoplastic radiation therapy: Secondary | ICD-10-CM | POA: Diagnosis not present

## 2016-11-09 DIAGNOSIS — E785 Hyperlipidemia, unspecified: Secondary | ICD-10-CM | POA: Diagnosis not present

## 2016-11-09 NOTE — Progress Notes (Signed)
   Department of Radiation Oncology  Phone:  [phone] Fax:        [phone]  Weekly Treatment Note    Name: Kyle Kelley Date: 11/09/2016 MRN: [id number] DOB: 05-30-1946   Diagnosis:  No diagnosis found.   Current dose: 55.8 Gy  Current fraction:31   MEDICATIONS: Current Outpatient Prescriptions  Medication Sig Dispense Refill  . diltiazem (CARDIZEM CD) 300 MG 24 hr capsule Take 300 mg by mouth daily.     Marland Kitchen EPINEPHrine (EPIPEN IJ) Inject 1 application as directed as needed.    . loratadine (CLARITIN) 10 MG tablet Take 10 mg by mouth daily as needed for allergies.    . metoprolol succinate (TOPROL-XL) 25 MG 24 hr tablet TAKE 1 TABLET BY MOUTH EVERY DAY 30 tablet 10  . warfarin (COUMADIN) 5 MG tablet TAKE AS DIRECTED BY COUMADIN CLINIC 35 tablet 3   No current facility-administered medications for this encounter.      ALLERGIES: Ace inhibitors; Rythmol [propafenone]; and Nitrofurantoin   LABORATORY DATA:  Lab Results  Component Value Date   WBC 7.9 10/24/2012   HGB 14.3 11/01/2012   HCT 43.7 11/01/2012   MCV 91.4 10/24/2012   PLT 217 10/24/2012   Lab Results  Component Value Date   NA 137 11/01/2012   K 4.0 11/01/2012   CL 102 11/01/2012   CO2 30 11/01/2012   No results found for: ALT, AST, GGT, ALKPHOS, BILITOT   NARRATIVE: Kyle Kelley was seen today for weekly treatment management. The chart was checked and the patient's films were reviewed.  He completed 31/38 treatments to the prostatic fossa. The patient reports nocturia x 0-1. He denies incontinence, dysuria, hematuria, diarrhea, or fatigue.  PHYSICAL EXAMINATION: vitals were not taken for this visit.      Weight: 168.2 lb BP: 131/78 Pulse: 78 Alert and oriented x3. In no distress.  ASSESSMENT: The patient is doing satisfactorily with treatment.  PLAN: We will continue with the patient's radiation treatment as planned.   This document serves as a record of services personally  performed by Kyung Rudd, MD. It was created on his behalf by Darcus Austin, a trained medical scribe. The creation of this record is based on the scribe's personal observations and the provider's statements to them. This document has been checked and approved by the attending provider.

## 2016-11-10 ENCOUNTER — Ambulatory Visit
Admission: RE | Admit: 2016-11-10 | Discharge: 2016-11-10 | Disposition: A | Discharge status: Home / Self Care | Payer: Medicare Other | Source: Ambulatory Visit | Attending: Radiation Oncology | Admitting: Radiation Oncology

## 2016-11-10 DIAGNOSIS — I10 Essential (primary) hypertension: Secondary | ICD-10-CM | POA: Diagnosis not present

## 2016-11-10 DIAGNOSIS — Z1389 Encounter for screening for other disorder: Secondary | ICD-10-CM | POA: Diagnosis not present

## 2016-11-10 DIAGNOSIS — I482 Chronic atrial fibrillation: Secondary | ICD-10-CM | POA: Diagnosis not present

## 2016-11-10 DIAGNOSIS — Z91030 Bee allergy status: Secondary | ICD-10-CM | POA: Diagnosis not present

## 2016-11-10 DIAGNOSIS — Z85828 Personal history of other malignant neoplasm of skin: Secondary | ICD-10-CM | POA: Diagnosis not present

## 2016-11-10 DIAGNOSIS — K5730 Diverticulosis of large intestine without perforation or abscess without bleeding: Secondary | ICD-10-CM | POA: Diagnosis not present

## 2016-11-10 DIAGNOSIS — Z7901 Long term (current) use of anticoagulants: Secondary | ICD-10-CM | POA: Diagnosis not present

## 2016-11-10 DIAGNOSIS — Z79899 Other long term (current) drug therapy: Secondary | ICD-10-CM | POA: Diagnosis not present

## 2016-11-10 DIAGNOSIS — Z0000 Encounter for general adult medical examination without abnormal findings: Secondary | ICD-10-CM | POA: Diagnosis not present

## 2016-11-10 DIAGNOSIS — C61 Malignant neoplasm of prostate: Secondary | ICD-10-CM | POA: Diagnosis not present

## 2016-11-10 DIAGNOSIS — E559 Vitamin D deficiency, unspecified: Secondary | ICD-10-CM | POA: Diagnosis not present

## 2016-11-10 DIAGNOSIS — K625 Hemorrhage of anus and rectum: Secondary | ICD-10-CM | POA: Diagnosis not present

## 2016-11-10 DIAGNOSIS — F17210 Nicotine dependence, cigarettes, uncomplicated: Secondary | ICD-10-CM | POA: Diagnosis not present

## 2016-11-10 DIAGNOSIS — E782 Mixed hyperlipidemia: Secondary | ICD-10-CM | POA: Diagnosis not present

## 2016-11-10 DIAGNOSIS — J309 Allergic rhinitis, unspecified: Secondary | ICD-10-CM | POA: Diagnosis not present

## 2016-11-10 DIAGNOSIS — E785 Hyperlipidemia, unspecified: Secondary | ICD-10-CM | POA: Diagnosis not present

## 2016-11-10 DIAGNOSIS — Z510 Encounter for antineoplastic radiation therapy: Secondary | ICD-10-CM | POA: Diagnosis not present

## 2016-11-11 ENCOUNTER — Ambulatory Visit
Admission: RE | Admit: 2016-11-11 | Discharge: 2016-11-11 | Disposition: A | Discharge status: Home / Self Care | Payer: Medicare Other | Source: Ambulatory Visit | Attending: Radiation Oncology | Admitting: Radiation Oncology

## 2016-11-11 DIAGNOSIS — I482 Chronic atrial fibrillation: Secondary | ICD-10-CM | POA: Diagnosis not present

## 2016-11-11 DIAGNOSIS — C61 Malignant neoplasm of prostate: Secondary | ICD-10-CM | POA: Diagnosis not present

## 2016-11-11 DIAGNOSIS — Z510 Encounter for antineoplastic radiation therapy: Secondary | ICD-10-CM | POA: Diagnosis not present

## 2016-11-11 DIAGNOSIS — Z85828 Personal history of other malignant neoplasm of skin: Secondary | ICD-10-CM | POA: Diagnosis not present

## 2016-11-11 DIAGNOSIS — I10 Essential (primary) hypertension: Secondary | ICD-10-CM | POA: Diagnosis not present

## 2016-11-11 DIAGNOSIS — E785 Hyperlipidemia, unspecified: Secondary | ICD-10-CM | POA: Diagnosis not present

## 2016-11-14 ENCOUNTER — Ambulatory Visit
Admission: RE | Admit: 2016-11-14 | Discharge: 2016-11-14 | Disposition: A | Discharge status: Home / Self Care | Payer: Medicare Other | Source: Ambulatory Visit | Attending: Radiation Oncology | Admitting: Radiation Oncology

## 2016-11-14 ENCOUNTER — Encounter: Payer: Self-pay | Admitting: Medical Oncology

## 2016-11-14 DIAGNOSIS — I482 Chronic atrial fibrillation: Secondary | ICD-10-CM | POA: Diagnosis not present

## 2016-11-14 DIAGNOSIS — E785 Hyperlipidemia, unspecified: Secondary | ICD-10-CM | POA: Diagnosis not present

## 2016-11-14 DIAGNOSIS — Z85828 Personal history of other malignant neoplasm of skin: Secondary | ICD-10-CM | POA: Diagnosis not present

## 2016-11-14 DIAGNOSIS — Z510 Encounter for antineoplastic radiation therapy: Secondary | ICD-10-CM | POA: Diagnosis not present

## 2016-11-14 DIAGNOSIS — I10 Essential (primary) hypertension: Secondary | ICD-10-CM | POA: Diagnosis not present

## 2016-11-14 DIAGNOSIS — C61 Malignant neoplasm of prostate: Secondary | ICD-10-CM | POA: Diagnosis not present

## 2016-11-14 NOTE — Progress Notes (Signed)
Mr. Liskey doing well with radiation treatments. He states that he is more fatigued but able to continue with ADL's. He will finish treatment 11/18/16. I encourage them to attend our prostate support group her at [facility]. They are very excited and look forward to attending in August.

## 2016-11-15 ENCOUNTER — Ambulatory Visit
Admission: RE | Admit: 2016-11-15 | Discharge: 2016-11-15 | Disposition: A | Discharge status: Home / Self Care | Payer: Medicare Other | Source: Ambulatory Visit | Attending: Radiation Oncology | Admitting: Radiation Oncology

## 2016-11-15 DIAGNOSIS — I10 Essential (primary) hypertension: Secondary | ICD-10-CM | POA: Diagnosis not present

## 2016-11-15 DIAGNOSIS — I482 Chronic atrial fibrillation: Secondary | ICD-10-CM | POA: Diagnosis not present

## 2016-11-15 DIAGNOSIS — Z85828 Personal history of other malignant neoplasm of skin: Secondary | ICD-10-CM | POA: Diagnosis not present

## 2016-11-15 DIAGNOSIS — Z510 Encounter for antineoplastic radiation therapy: Secondary | ICD-10-CM | POA: Diagnosis not present

## 2016-11-15 DIAGNOSIS — E785 Hyperlipidemia, unspecified: Secondary | ICD-10-CM | POA: Diagnosis not present

## 2016-11-15 DIAGNOSIS — C61 Malignant neoplasm of prostate: Secondary | ICD-10-CM | POA: Diagnosis not present

## 2016-11-16 ENCOUNTER — Ambulatory Visit
Admission: RE | Admit: 2016-11-16 | Discharge: 2016-11-16 | Disposition: A | Discharge status: Home / Self Care | Payer: Medicare Other | Source: Ambulatory Visit | Attending: Radiation Oncology | Admitting: Radiation Oncology

## 2016-11-16 DIAGNOSIS — Z85828 Personal history of other malignant neoplasm of skin: Secondary | ICD-10-CM | POA: Diagnosis not present

## 2016-11-16 DIAGNOSIS — C61 Malignant neoplasm of prostate: Secondary | ICD-10-CM | POA: Diagnosis not present

## 2016-11-16 DIAGNOSIS — I482 Chronic atrial fibrillation: Secondary | ICD-10-CM | POA: Diagnosis not present

## 2016-11-16 DIAGNOSIS — E785 Hyperlipidemia, unspecified: Secondary | ICD-10-CM | POA: Diagnosis not present

## 2016-11-16 DIAGNOSIS — I10 Essential (primary) hypertension: Secondary | ICD-10-CM | POA: Diagnosis not present

## 2016-11-16 DIAGNOSIS — Z510 Encounter for antineoplastic radiation therapy: Secondary | ICD-10-CM | POA: Diagnosis not present

## 2016-11-17 ENCOUNTER — Ambulatory Visit
Admission: RE | Admit: 2016-11-17 | Discharge: 2016-11-17 | Disposition: A | Discharge status: Home / Self Care | Payer: Medicare Other | Source: Ambulatory Visit | Attending: Radiation Oncology | Admitting: Radiation Oncology

## 2016-11-17 ENCOUNTER — Ambulatory Visit: Payer: Medicare Other

## 2016-11-17 DIAGNOSIS — Z85828 Personal history of other malignant neoplasm of skin: Secondary | ICD-10-CM | POA: Diagnosis not present

## 2016-11-17 DIAGNOSIS — I10 Essential (primary) hypertension: Secondary | ICD-10-CM | POA: Diagnosis not present

## 2016-11-17 DIAGNOSIS — I482 Chronic atrial fibrillation: Secondary | ICD-10-CM | POA: Diagnosis not present

## 2016-11-17 DIAGNOSIS — C61 Malignant neoplasm of prostate: Secondary | ICD-10-CM | POA: Diagnosis not present

## 2016-11-17 DIAGNOSIS — E785 Hyperlipidemia, unspecified: Secondary | ICD-10-CM | POA: Diagnosis not present

## 2016-11-17 DIAGNOSIS — Z510 Encounter for antineoplastic radiation therapy: Secondary | ICD-10-CM | POA: Diagnosis not present

## 2016-11-18 ENCOUNTER — Encounter: Payer: Self-pay | Admitting: Radiation Oncology

## 2016-11-18 ENCOUNTER — Ambulatory Visit
Admission: RE | Admit: 2016-11-18 | Discharge: 2016-11-18 | Disposition: A | Discharge status: Home / Self Care | Payer: Medicare Other | Source: Ambulatory Visit | Attending: Radiation Oncology | Admitting: Radiation Oncology

## 2016-11-18 DIAGNOSIS — I482 Chronic atrial fibrillation: Secondary | ICD-10-CM | POA: Diagnosis not present

## 2016-11-18 DIAGNOSIS — I10 Essential (primary) hypertension: Secondary | ICD-10-CM | POA: Diagnosis not present

## 2016-11-18 DIAGNOSIS — C61 Malignant neoplasm of prostate: Secondary | ICD-10-CM | POA: Diagnosis not present

## 2016-11-18 DIAGNOSIS — E785 Hyperlipidemia, unspecified: Secondary | ICD-10-CM | POA: Diagnosis not present

## 2016-11-18 DIAGNOSIS — Z510 Encounter for antineoplastic radiation therapy: Secondary | ICD-10-CM | POA: Diagnosis not present

## 2016-11-18 DIAGNOSIS — Z85828 Personal history of other malignant neoplasm of skin: Secondary | ICD-10-CM | POA: Diagnosis not present

## 2016-11-18 NOTE — Progress Notes (Signed)
  Radiation Oncology         [phone]) [phone] ________________________________  Name: Kyle Kelley MRN: [id number]  Date: 11/18/2016  DOB: 1946-02-14  End of Treatment Note  Diagnosis:   71 y.o.  man with detectable PSA of 0.17 s/p prostatectomy 10/31/12 for stage pT2c Nx M0 adenocarcinoma with a Gleason 3+4 and focal left posterolateral margin involvement  Indication for treatment:  Curative, Prostatic Fossa Radiotherapy       Radiation treatment dates:   09/26/16 - 11/18/16  Site/dose:   The prostatic fossa was treated to 68.4 Gy in 38 fractions of 1.8 Gy  Beams/energy:   The prostatic fossa was treated using helical intensity modulated radiotherapy delivering 6 megavolt photons. Image guidance was performed with megavoltage CT studies prior to each fraction. He was immobilized with a body fix lower extremity mold.  Narrative: The patient tolerated radiation treatment relatively well. The patient denied dysuria, hematuria, or bowel issues. He complained of fatigue, nocturia x 0-1, and rare stress incontinence.  Plan: The patient has completed radiation treatment. He will return to radiation oncology clinic for routine followup in one month. I advised him to call or return sooner if he has any questions or concerns related to his recovery or treatment. ________________________________  Sheral Apley. Tammi Klippel, M.D.  This document serves as a record of services personally performed by [name], MD. It was created on his behalf by Darcus Austin, a trained medical scribe. The creation of this record is based on the scribe's personal observations and the provider's statements to them. This document has been checked and approved by the attending provider.

## 2016-12-07 ENCOUNTER — Ambulatory Visit (INDEPENDENT_AMBULATORY_CARE_PROVIDER_SITE_OTHER): Payer: Medicare Other | Admitting: Pharmacist

## 2016-12-07 DIAGNOSIS — Z5181 Encounter for therapeutic drug level monitoring: Secondary | ICD-10-CM | POA: Diagnosis not present

## 2016-12-07 DIAGNOSIS — I482 Chronic atrial fibrillation: Secondary | ICD-10-CM | POA: Diagnosis not present

## 2016-12-07 DIAGNOSIS — I4821 Permanent atrial fibrillation: Secondary | ICD-10-CM

## 2016-12-07 DIAGNOSIS — I4891 Unspecified atrial fibrillation: Secondary | ICD-10-CM | POA: Diagnosis not present

## 2016-12-07 LAB — POCT INR: INR: 2.9

## 2017-01-03 NOTE — Progress Notes (Signed)
  Radiation Oncology         [phone] ________________________________  Name: Kyle Kelley MRN: [id number]  Date: 01/04/2017  DOB: Apr 04, 1946  Post Treatment Note  CC: Josetta Huddle, MD  Rana Snare, MD  Diagnosis:  71 y.o.  man with detectable PSA of 0.17 s/p prostatectomy 10/31/12 for stage pT2c Nx M0 adenocarcinoma with a Gleason 3+4 and focal left posterolateral margin involvement.  Interval Since Last Radiation:  6 weeks  09/26/16 - 11/18/16:  The prostatic fossa was treated to 68.4 Gy in 38 fractions of 1.8 Gy  Narrative:  The patient returns today for routine follow-up. He tolerated radiation treatment relatively well.  He did experience some modest fatigue, nocturia x 0-1, and rare stress incontinence but denied dysuria, hematuria, or bowel issues.                         On review of systems, the patient states well overall. He is currently without complaints and specifically denies gross hematuria, dysuria, urgency, frequency, nocturia, incomplete bladder emptying, fever or chills. He reports a healthy appetite and is maintaining his weight. He denies abdominal pain, nausea, vomiting or diarrhea.  ALLERGIES:  is allergic to ace inhibitors; rythmol [propafenone]; and nitrofurantoin.  Meds: Current Outpatient Prescriptions  Medication Sig Dispense Refill  . diltiazem (CARDIZEM CD) 300 MG 24 hr capsule Take 300 mg by mouth daily.     Marland Kitchen EPINEPHrine (EPIPEN IJ) Inject 1 application as directed as needed.    . loratadine (CLARITIN) 10 MG tablet Take 10 mg by mouth daily as needed for allergies.    . metoprolol succinate (TOPROL-XL) 25 MG 24 hr tablet TAKE 1 TABLET BY MOUTH EVERY DAY 30 tablet 10  . warfarin (COUMADIN) 5 MG tablet TAKE AS DIRECTED BY COUMADIN CLINIC 35 tablet 3   No current facility-administered medications for this encounter.     Physical Findings:  vitals were not taken for this visit.  /10 In general this is a well appearing male in no acute distress. He's  alert and oriented x4 and appropriate throughout the examination. Cardiopulmonary assessment is negative for acute distress and he exhibits normal effort.   Lab Findings: Lab Results  Component Value Date   WBC 7.9 10/24/2012   HGB 14.3 11/01/2012   HCT 43.7 11/01/2012   MCV 91.4 10/24/2012   PLT 217 10/24/2012     Radiographic Findings: No results found.  Impression/Plan: 73. 71 y.o.  man with detectable PSA of 0.17 s/p prostatectomy 10/31/12 for stage pT2c Nx M0 adenocarcinoma with a Gleason 3+4 and focal left posterolateral margin involvement. He will continue to follow up with urology for ongoing PSA determinations and has an appointment scheduled with Dr. Risa Grill on 02/13/17. He understands what to expect with regards to PSA monitoring going forward. I will look forward to following his response to treatment via correspondence with urology, and would be happy to continue to participate in his care if clinically indicated. I talked to the patient about what to expect in the future, including his risk for erectile dysfunction rectal bleeding. I encouraged him to call or return to the office if he has any questions regarding his previous radiation or possible radiation side effects. He was comfortable with this plan and will follow up as needed.    Nicholos Johns, PA-C

## 2017-01-04 ENCOUNTER — Ambulatory Visit
Admission: RE | Admit: 2017-01-04 | Discharge: 2017-01-04 | Disposition: A | Discharge status: Home / Self Care | Payer: Medicare Other | Source: Ambulatory Visit | Attending: Urology | Admitting: Urology

## 2017-01-04 DIAGNOSIS — Z7901 Long term (current) use of anticoagulants: Secondary | ICD-10-CM | POA: Insufficient documentation

## 2017-01-04 DIAGNOSIS — Z881 Allergy status to other antibiotic agents status: Secondary | ICD-10-CM | POA: Diagnosis not present

## 2017-01-04 DIAGNOSIS — Z08 Encounter for follow-up examination after completed treatment for malignant neoplasm: Secondary | ICD-10-CM | POA: Diagnosis not present

## 2017-01-04 DIAGNOSIS — Z923 Personal history of irradiation: Secondary | ICD-10-CM | POA: Diagnosis not present

## 2017-01-04 DIAGNOSIS — C61 Malignant neoplasm of prostate: Secondary | ICD-10-CM

## 2017-01-04 DIAGNOSIS — Z8546 Personal history of malignant neoplasm of prostate: Secondary | ICD-10-CM | POA: Insufficient documentation

## 2017-01-09 NOTE — Addendum Note (Signed)
Encounter addended by: Malena Edman, RN on: 01/09/2017 10:07 AM<BR>    Actions taken: Visit Navigator Flowsheet section accepted

## 2017-01-17 ENCOUNTER — Ambulatory Visit (INDEPENDENT_AMBULATORY_CARE_PROVIDER_SITE_OTHER): Payer: Medicare Other

## 2017-01-17 DIAGNOSIS — Z5181 Encounter for therapeutic drug level monitoring: Secondary | ICD-10-CM

## 2017-01-17 DIAGNOSIS — I4891 Unspecified atrial fibrillation: Secondary | ICD-10-CM

## 2017-01-17 DIAGNOSIS — I4821 Permanent atrial fibrillation: Secondary | ICD-10-CM

## 2017-01-17 DIAGNOSIS — I482 Chronic atrial fibrillation: Secondary | ICD-10-CM | POA: Diagnosis not present

## 2017-01-17 LAB — POCT INR: INR: 2.4

## 2017-02-06 DIAGNOSIS — C61 Malignant neoplasm of prostate: Secondary | ICD-10-CM | POA: Diagnosis not present

## 2017-02-13 DIAGNOSIS — C61 Malignant neoplasm of prostate: Secondary | ICD-10-CM | POA: Diagnosis not present

## 2017-02-25 ENCOUNTER — Other Ambulatory Visit: Payer: Self-pay | Admitting: Cardiology

## 2017-02-28 ENCOUNTER — Ambulatory Visit (INDEPENDENT_AMBULATORY_CARE_PROVIDER_SITE_OTHER): Payer: Medicare Other | Admitting: *Deleted

## 2017-02-28 DIAGNOSIS — Z5181 Encounter for therapeutic drug level monitoring: Secondary | ICD-10-CM

## 2017-02-28 DIAGNOSIS — I482 Chronic atrial fibrillation: Secondary | ICD-10-CM

## 2017-02-28 DIAGNOSIS — I4891 Unspecified atrial fibrillation: Secondary | ICD-10-CM | POA: Diagnosis not present

## 2017-02-28 DIAGNOSIS — I4821 Permanent atrial fibrillation: Secondary | ICD-10-CM

## 2017-02-28 LAB — POCT INR: INR: 3.2

## 2017-03-16 ENCOUNTER — Ambulatory Visit (INDEPENDENT_AMBULATORY_CARE_PROVIDER_SITE_OTHER): Payer: Medicare Other | Admitting: *Deleted

## 2017-03-16 DIAGNOSIS — I482 Chronic atrial fibrillation: Secondary | ICD-10-CM | POA: Diagnosis not present

## 2017-03-16 DIAGNOSIS — I4891 Unspecified atrial fibrillation: Secondary | ICD-10-CM | POA: Diagnosis not present

## 2017-03-16 DIAGNOSIS — I4821 Permanent atrial fibrillation: Secondary | ICD-10-CM

## 2017-03-16 DIAGNOSIS — Z5181 Encounter for therapeutic drug level monitoring: Secondary | ICD-10-CM

## 2017-03-16 LAB — POCT INR: INR: 3.1

## 2017-03-23 DIAGNOSIS — Z23 Encounter for immunization: Secondary | ICD-10-CM | POA: Diagnosis not present

## 2017-04-11 DIAGNOSIS — D485 Neoplasm of uncertain behavior of skin: Secondary | ICD-10-CM | POA: Diagnosis not present

## 2017-04-11 DIAGNOSIS — D2271 Melanocytic nevi of right lower limb, including hip: Secondary | ICD-10-CM | POA: Diagnosis not present

## 2017-04-11 DIAGNOSIS — L821 Other seborrheic keratosis: Secondary | ICD-10-CM | POA: Diagnosis not present

## 2017-04-11 DIAGNOSIS — Z85828 Personal history of other malignant neoplasm of skin: Secondary | ICD-10-CM | POA: Diagnosis not present

## 2017-04-11 DIAGNOSIS — D1801 Hemangioma of skin and subcutaneous tissue: Secondary | ICD-10-CM | POA: Diagnosis not present

## 2017-04-12 ENCOUNTER — Ambulatory Visit (INDEPENDENT_AMBULATORY_CARE_PROVIDER_SITE_OTHER): Payer: Medicare Other | Admitting: *Deleted

## 2017-04-12 DIAGNOSIS — Z5181 Encounter for therapeutic drug level monitoring: Secondary | ICD-10-CM

## 2017-04-12 DIAGNOSIS — I4821 Permanent atrial fibrillation: Secondary | ICD-10-CM

## 2017-04-12 DIAGNOSIS — I4891 Unspecified atrial fibrillation: Secondary | ICD-10-CM

## 2017-04-12 LAB — POCT INR: INR: 2.7

## 2017-04-12 NOTE — Patient Instructions (Signed)
Continue on same dosage 1 tablet everyday.  Recheck INR in 5 weeks.

## 2017-04-27 DIAGNOSIS — L988 Other specified disorders of the skin and subcutaneous tissue: Secondary | ICD-10-CM | POA: Diagnosis not present

## 2017-04-27 DIAGNOSIS — D485 Neoplasm of uncertain behavior of skin: Secondary | ICD-10-CM | POA: Diagnosis not present

## 2017-04-27 DIAGNOSIS — Z85828 Personal history of other malignant neoplasm of skin: Secondary | ICD-10-CM | POA: Diagnosis not present

## 2017-05-16 ENCOUNTER — Ambulatory Visit (INDEPENDENT_AMBULATORY_CARE_PROVIDER_SITE_OTHER): Payer: Medicare Other | Admitting: *Deleted

## 2017-05-16 DIAGNOSIS — Z5181 Encounter for therapeutic drug level monitoring: Secondary | ICD-10-CM

## 2017-05-16 DIAGNOSIS — I4891 Unspecified atrial fibrillation: Secondary | ICD-10-CM | POA: Diagnosis not present

## 2017-05-16 DIAGNOSIS — I4821 Permanent atrial fibrillation: Secondary | ICD-10-CM

## 2017-05-16 DIAGNOSIS — I482 Chronic atrial fibrillation: Secondary | ICD-10-CM | POA: Diagnosis not present

## 2017-05-16 LAB — POCT INR: INR: 2.1

## 2017-05-16 NOTE — Patient Instructions (Signed)
Description   Continue on same dosage 1 tablet everyday.  Recheck INR in 5 weeks.

## 2017-06-20 ENCOUNTER — Ambulatory Visit (INDEPENDENT_AMBULATORY_CARE_PROVIDER_SITE_OTHER): Payer: Medicare Other

## 2017-06-20 DIAGNOSIS — Z5181 Encounter for therapeutic drug level monitoring: Secondary | ICD-10-CM

## 2017-06-20 DIAGNOSIS — I4821 Permanent atrial fibrillation: Secondary | ICD-10-CM

## 2017-06-20 DIAGNOSIS — I482 Chronic atrial fibrillation: Secondary | ICD-10-CM | POA: Diagnosis not present

## 2017-06-20 DIAGNOSIS — I4891 Unspecified atrial fibrillation: Secondary | ICD-10-CM | POA: Diagnosis not present

## 2017-06-20 LAB — POCT INR: INR: 2.2

## 2017-06-20 NOTE — Patient Instructions (Signed)
Description   Continue on same dosage 1 tablet everyday.  Recheck INR in 6 weeks.      

## 2017-06-24 ENCOUNTER — Other Ambulatory Visit: Payer: Self-pay | Admitting: Cardiology

## 2017-07-01 ENCOUNTER — Other Ambulatory Visit: Payer: Self-pay | Admitting: Cardiology

## 2017-07-10 ENCOUNTER — Other Ambulatory Visit: Payer: Self-pay

## 2017-07-10 ENCOUNTER — Encounter: Payer: Self-pay | Admitting: Cardiology

## 2017-07-10 ENCOUNTER — Ambulatory Visit (INDEPENDENT_AMBULATORY_CARE_PROVIDER_SITE_OTHER): Payer: Medicare Other | Admitting: Cardiology

## 2017-07-10 VITALS — BP 134/86 | HR 87 | Ht 74.0 in | Wt 177.8 lb

## 2017-07-10 DIAGNOSIS — I482 Chronic atrial fibrillation: Secondary | ICD-10-CM

## 2017-07-10 DIAGNOSIS — I10 Essential (primary) hypertension: Secondary | ICD-10-CM | POA: Diagnosis not present

## 2017-07-10 DIAGNOSIS — I4821 Permanent atrial fibrillation: Secondary | ICD-10-CM

## 2017-07-10 NOTE — Progress Notes (Signed)
Cardiology Office Note:    Date:  07/10/2017   ID:  Kyle Kelley, DOB Jul 03, 1945, MRN [id number]  PCP:  Josetta Huddle, MD  Cardiologist:  No primary care provider on file.    Referring MD: Josetta Huddle, MD   Chief Complaint  Patient presents with  . Atrial Fibrillation  . Hypertension    History of Present Illness:    Kyle Kelley is a 72 y.o. male with a hx of chronic atrial fibrillation on systemic anticoagulation and HTN.  He is here today for followup and is doing well.  He denies any chest pain or pressure, SOB, DOE, PND, orthopnea, LE edema, dizziness, palpitations or syncope. He is compliant with his meds and is tolerating meds with no SE.    Past Medical History:  Diagnosis Date  . Dyslipidemia   . Hypertension   . Inguinal hernia 2010   Small asymptomatic L inguinal Hernia  . Permanent atrial fibrillation (HCC)    atrial fib   . Pneumonia    hx of   . Prostate cancer ([place]) 07/2012   s/p prostatectomy  . PSA elevation    workup per Dr. Rana Snare, March 2014  . Sigmoid diverticulosis    on colonoscopy in 2010  . Skin cancer 02/2012   Dr Holley Dexter  . Tobacco dependence    rarely smokes-3 cigs per week    Past Surgical History:  Procedure Laterality Date  . CARDIOVERSION  08/05/2005   Direct Current Cardioversion  . COLONOSCOPY  2010  . Direct current Cardioversion  08/05/2005  . PROSTATE BIOPSY  09/06/2012   Dr. Rana Snare  . ROBOT ASSISTED LAPAROSCOPIC RADICAL PROSTATECTOMY N/A 10/31/2012   Procedure: ROBOTIC ASSISTED LAPAROSCOPIC RADICAL PROSTATECTOMY;  Surgeon: Bernestine Amass, MD;  Location: [facility];  Service: Urology;  Laterality: N/A;  . TONSILLECTOMY AND ADENOIDECTOMY    . TONSILLECTOMY AND ADENOIDECTOMY      Current Medications: Current Meds  Medication Sig  . diltiazem (CARDIZEM CD) 300 MG 24 hr capsule Take 300 mg by mouth daily.   Marland Kitchen EPINEPHrine (EPIPEN IJ) Inject 1 application as directed as needed.  . loratadine (CLARITIN) 10 MG tablet  Take 10 mg by mouth daily as needed for allergies.  . metoprolol succinate (TOPROL-XL) 25 MG 24 hr tablet TAKE 1 TABLET BY MOUTH EVERY DAY  . warfarin (COUMADIN) 5 MG tablet TAKE AS DIRECTED BY COUMADIN CLINIC     Allergies:   Ace inhibitors; Rythmol [propafenone]; and Nitrofurantoin   Social History   Socioeconomic History  . Marital status: Married    Spouse name: None  . Number of children: None  . Years of education: None  . Highest education level: None  Social Needs  . Financial resource strain: None  . Food insecurity - worry: None  . Food insecurity - inability: None  . Transportation needs - medical: None  . Transportation needs - non-medical: None  Occupational History  . None  Tobacco Use  . Smoking status: Current Every Day Smoker    Packs/day: 0.50    Years: 50.00    Pack years: 25.00    Types: Cigarettes  . Smokeless tobacco: Never Used  Substance and Sexual Activity  . Alcohol use: Yes    Comment: rare, heavier in the past  . Drug use: No  . Sexual activity: No  Other Topics Concern  . None  Social History Narrative   Tobacco Use cigarettes: Current Smoker   Smoking: Yes, less than 1/2 pk  per month   Alcohol: Yes - No Alcohol since June 2005   Caffeine: yes   No recreational drug use   Occupation: Employed   Marital Status: Married 81 years, wife has MS   Children: none     Family History: The patient's family history includes Cancer in his father; Heart disease in his father; Hyperlipidemia in his father; Hypertension in his mother.  ROS:   Please see the history of present illness.    ROS  All other systems reviewed and negative.   EKGs/Labs/Other Studies Reviewed:    The following studies were reviewed today: none  EKG:  EKG is  ordered today.  The ekg ordered today demonstrates atrial fibrillation with CVR at 87bpm with septal infact  Recent Labs: No results found for requested labs within last 8760 hours.   Recent Lipid Panel No  results found for: CHOL, TRIG, HDL, CHOLHDL, VLDL, LDLCALC, LDLDIRECT  Physical Exam:    VS:  BP 134/86   Pulse 87   Ht 6\' 2"  (1.88 m)   Wt 177 lb 12.8 oz (80.6 kg)   SpO2 98%   BMI 22.83 kg/m     Wt Readings from Last 3 Encounters:  07/10/17 177 lb 12.8 oz (80.6 kg)  09/05/16 174 lb 12.8 oz (79.3 kg)  07/20/16 176 lb 6.4 oz (80 kg)     GEN:  Well nourished, well developed in no acute distress HEENT: Normal NECK: No JVD; No carotid bruits LYMPHATICS: No lymphadenopathy CARDIAC: irregularly irregular, no murmurs, rubs, gallops RESPIRATORY:  Clear to auscultation without rales, wheezing or rhonchi  ABDOMEN: Soft, non-tender, non-distended MUSCULOSKELETAL:  No edema; No deformity  SKIN: Warm and dry NEUROLOGIC:  Alert and oriented x 3 PSYCHIATRIC:  Normal affect   ASSESSMENT:    1. Permanent atrial fibrillation ([place])   2. Essential hypertension, benign    PLAN:    In order of problems listed above:  1.  Permanent atrial fibrillation - his heart rate is well controlled on current meds.  He will continue on Cardizem CD 300mg  daily and Toprol XL 25mg  daily.  He is on warfarin for a CHADS2VASC score os 2 and this is followed in our coumadin clinic.    2.  HTN - BP is well controlled on exam today.  He will continue on Cardizem and Toprol.     Medication Adjustments/Labs and Tests Ordered: Current medicines are reviewed at length with the patient today.  Concerns regarding medicines are outlined above.  No orders of the defined types were placed in this encounter.  No orders of the defined types were placed in this encounter.   Signed, Fransico Him, MD  07/10/2017 11:15 AM    [place]

## 2017-07-10 NOTE — Patient Instructions (Signed)
Medication Instructions:  Your physician recommends that you continue on your current medications as directed. Please refer to the Current Medication list given to you today.  If you need a refill on your cardiac medications, please contact your pharmacy first.  Labwork: None ordered   Testing/Procedures: None ordered   Follow-Up: Your physician wants you to follow-up in: 1 year with Dr. Turner. You will receive a reminder letter in the mail two months in advance. If you don't receive a letter, please call our office to schedule the follow-up appointment.  Any Other Special Instructions Will Be Listed Below (If Applicable).   Thank you for choosing [facility]    Rena Tesha Archambeau, RN  [phone]  If you need a refill on your cardiac medications before your next appointment, please call your pharmacy.   

## 2017-07-30 ENCOUNTER — Other Ambulatory Visit: Payer: Self-pay | Admitting: Cardiology

## 2017-08-01 ENCOUNTER — Ambulatory Visit (INDEPENDENT_AMBULATORY_CARE_PROVIDER_SITE_OTHER): Payer: Medicare Other | Admitting: *Deleted

## 2017-08-01 DIAGNOSIS — I482 Chronic atrial fibrillation: Secondary | ICD-10-CM

## 2017-08-01 DIAGNOSIS — I4891 Unspecified atrial fibrillation: Secondary | ICD-10-CM

## 2017-08-01 DIAGNOSIS — Z5181 Encounter for therapeutic drug level monitoring: Secondary | ICD-10-CM

## 2017-08-01 DIAGNOSIS — I4821 Permanent atrial fibrillation: Secondary | ICD-10-CM

## 2017-08-01 LAB — POCT INR: INR: 2.5

## 2017-08-01 NOTE — Patient Instructions (Signed)
Description   Continue on same dosage 1 tablet everyday.  Recheck INR in 6 weeks.      

## 2017-08-09 DIAGNOSIS — C61 Malignant neoplasm of prostate: Secondary | ICD-10-CM | POA: Diagnosis not present

## 2017-08-16 DIAGNOSIS — Z8546 Personal history of malignant neoplasm of prostate: Secondary | ICD-10-CM | POA: Diagnosis not present

## 2017-08-16 DIAGNOSIS — N5201 Erectile dysfunction due to arterial insufficiency: Secondary | ICD-10-CM | POA: Diagnosis not present

## 2017-08-22 ENCOUNTER — Ambulatory Visit: Payer: Medicare Other | Admitting: Cardiology

## 2017-09-12 ENCOUNTER — Ambulatory Visit (INDEPENDENT_AMBULATORY_CARE_PROVIDER_SITE_OTHER): Payer: Medicare Other

## 2017-09-12 DIAGNOSIS — Z5181 Encounter for therapeutic drug level monitoring: Secondary | ICD-10-CM | POA: Diagnosis not present

## 2017-09-12 DIAGNOSIS — I4891 Unspecified atrial fibrillation: Secondary | ICD-10-CM | POA: Diagnosis not present

## 2017-09-12 DIAGNOSIS — I4821 Permanent atrial fibrillation: Secondary | ICD-10-CM

## 2017-09-12 DIAGNOSIS — I482 Chronic atrial fibrillation: Secondary | ICD-10-CM | POA: Diagnosis not present

## 2017-09-12 LAB — POCT INR: INR: 2

## 2017-09-12 NOTE — Patient Instructions (Signed)
Description   Continue on same dosage 1 tablet everyday.  Recheck INR in 6 weeks.      

## 2017-10-09 DIAGNOSIS — D225 Melanocytic nevi of trunk: Secondary | ICD-10-CM | POA: Diagnosis not present

## 2017-10-09 DIAGNOSIS — D2272 Melanocytic nevi of left lower limb, including hip: Secondary | ICD-10-CM | POA: Diagnosis not present

## 2017-10-09 DIAGNOSIS — L821 Other seborrheic keratosis: Secondary | ICD-10-CM | POA: Diagnosis not present

## 2017-10-09 DIAGNOSIS — Z85828 Personal history of other malignant neoplasm of skin: Secondary | ICD-10-CM | POA: Diagnosis not present

## 2017-10-18 ENCOUNTER — Other Ambulatory Visit: Payer: Self-pay | Admitting: Cardiology

## 2017-10-24 ENCOUNTER — Ambulatory Visit (INDEPENDENT_AMBULATORY_CARE_PROVIDER_SITE_OTHER): Payer: Medicare Other

## 2017-10-24 DIAGNOSIS — I4891 Unspecified atrial fibrillation: Secondary | ICD-10-CM

## 2017-10-24 DIAGNOSIS — I482 Chronic atrial fibrillation: Secondary | ICD-10-CM | POA: Diagnosis not present

## 2017-10-24 DIAGNOSIS — Z5181 Encounter for therapeutic drug level monitoring: Secondary | ICD-10-CM

## 2017-10-24 DIAGNOSIS — I4821 Permanent atrial fibrillation: Secondary | ICD-10-CM

## 2017-10-24 LAB — POCT INR: INR: 2.5 (ref 2.0–3.0)

## 2017-10-24 NOTE — Patient Instructions (Signed)
Description   Continue on same dosage 1 tablet everyday.  Recheck INR in 6 weeks.      

## 2017-11-21 DIAGNOSIS — Z79899 Other long term (current) drug therapy: Secondary | ICD-10-CM | POA: Diagnosis not present

## 2017-11-21 DIAGNOSIS — K4020 Bilateral inguinal hernia, without obstruction or gangrene, not specified as recurrent: Secondary | ICD-10-CM | POA: Diagnosis not present

## 2017-11-21 DIAGNOSIS — K5730 Diverticulosis of large intestine without perforation or abscess without bleeding: Secondary | ICD-10-CM | POA: Diagnosis not present

## 2017-11-21 DIAGNOSIS — Z7901 Long term (current) use of anticoagulants: Secondary | ICD-10-CM | POA: Diagnosis not present

## 2017-11-21 DIAGNOSIS — Z1389 Encounter for screening for other disorder: Secondary | ICD-10-CM | POA: Diagnosis not present

## 2017-11-21 DIAGNOSIS — E559 Vitamin D deficiency, unspecified: Secondary | ICD-10-CM | POA: Diagnosis not present

## 2017-11-21 DIAGNOSIS — I482 Chronic atrial fibrillation: Secondary | ICD-10-CM | POA: Diagnosis not present

## 2017-11-21 DIAGNOSIS — Z91030 Bee allergy status: Secondary | ICD-10-CM | POA: Diagnosis not present

## 2017-11-21 DIAGNOSIS — J309 Allergic rhinitis, unspecified: Secondary | ICD-10-CM | POA: Diagnosis not present

## 2017-11-21 DIAGNOSIS — F17210 Nicotine dependence, cigarettes, uncomplicated: Secondary | ICD-10-CM | POA: Diagnosis not present

## 2017-11-21 DIAGNOSIS — Z0000 Encounter for general adult medical examination without abnormal findings: Secondary | ICD-10-CM | POA: Diagnosis not present

## 2017-11-21 DIAGNOSIS — C61 Malignant neoplasm of prostate: Secondary | ICD-10-CM | POA: Diagnosis not present

## 2017-11-21 DIAGNOSIS — E782 Mixed hyperlipidemia: Secondary | ICD-10-CM | POA: Diagnosis not present

## 2017-11-21 DIAGNOSIS — Z23 Encounter for immunization: Secondary | ICD-10-CM | POA: Diagnosis not present

## 2017-11-21 DIAGNOSIS — I10 Essential (primary) hypertension: Secondary | ICD-10-CM | POA: Diagnosis not present

## 2017-12-05 ENCOUNTER — Ambulatory Visit (INDEPENDENT_AMBULATORY_CARE_PROVIDER_SITE_OTHER): Payer: Medicare Other

## 2017-12-05 DIAGNOSIS — I4891 Unspecified atrial fibrillation: Secondary | ICD-10-CM | POA: Diagnosis not present

## 2017-12-05 DIAGNOSIS — I482 Chronic atrial fibrillation: Secondary | ICD-10-CM | POA: Diagnosis not present

## 2017-12-05 DIAGNOSIS — Z5181 Encounter for therapeutic drug level monitoring: Secondary | ICD-10-CM | POA: Diagnosis not present

## 2017-12-05 DIAGNOSIS — I4821 Permanent atrial fibrillation: Secondary | ICD-10-CM

## 2017-12-05 LAB — POCT INR: INR: 2.6 (ref 2.0–3.0)

## 2017-12-05 NOTE — Patient Instructions (Signed)
Description   Continue on same dosage 1 tablet everyday.  Recheck INR in 6 weeks.

## 2018-01-16 ENCOUNTER — Encounter (INDEPENDENT_AMBULATORY_CARE_PROVIDER_SITE_OTHER): Payer: Self-pay

## 2018-01-16 ENCOUNTER — Ambulatory Visit (INDEPENDENT_AMBULATORY_CARE_PROVIDER_SITE_OTHER): Payer: Medicare Other

## 2018-01-16 DIAGNOSIS — Z5181 Encounter for therapeutic drug level monitoring: Secondary | ICD-10-CM

## 2018-01-16 DIAGNOSIS — I482 Chronic atrial fibrillation: Secondary | ICD-10-CM | POA: Diagnosis not present

## 2018-01-16 DIAGNOSIS — I4891 Unspecified atrial fibrillation: Secondary | ICD-10-CM | POA: Diagnosis not present

## 2018-01-16 DIAGNOSIS — I4821 Permanent atrial fibrillation: Secondary | ICD-10-CM

## 2018-01-16 LAB — POCT INR: INR: 2.6 (ref 2.0–3.0)

## 2018-01-16 NOTE — Patient Instructions (Signed)
Continue on same dosage 1 tablet everyday.  Recheck INR in 6 weeks.

## 2018-02-08 DIAGNOSIS — Z23 Encounter for immunization: Secondary | ICD-10-CM | POA: Diagnosis not present

## 2018-02-28 ENCOUNTER — Ambulatory Visit (INDEPENDENT_AMBULATORY_CARE_PROVIDER_SITE_OTHER): Payer: Medicare Other | Admitting: *Deleted

## 2018-02-28 DIAGNOSIS — I4821 Permanent atrial fibrillation: Secondary | ICD-10-CM

## 2018-02-28 DIAGNOSIS — I4891 Unspecified atrial fibrillation: Secondary | ICD-10-CM

## 2018-02-28 DIAGNOSIS — Z5181 Encounter for therapeutic drug level monitoring: Secondary | ICD-10-CM | POA: Diagnosis not present

## 2018-02-28 DIAGNOSIS — Z8546 Personal history of malignant neoplasm of prostate: Secondary | ICD-10-CM | POA: Diagnosis not present

## 2018-02-28 LAB — POCT INR: INR: 2.4 (ref 2.0–3.0)

## 2018-02-28 NOTE — Patient Instructions (Signed)
Description   Continue on same dosage 1 tablet everyday.  Recheck INR in 6 weeks.

## 2018-03-07 DIAGNOSIS — N5201 Erectile dysfunction due to arterial insufficiency: Secondary | ICD-10-CM | POA: Diagnosis not present

## 2018-03-07 DIAGNOSIS — C61 Malignant neoplasm of prostate: Secondary | ICD-10-CM | POA: Diagnosis not present

## 2018-03-14 ENCOUNTER — Other Ambulatory Visit: Payer: Self-pay | Admitting: *Deleted

## 2018-03-14 MED ORDER — WARFARIN SODIUM 5 MG PO TABS: ORAL_TABLET | ORAL | 3 refills | Status: DC

## 2018-04-10 ENCOUNTER — Ambulatory Visit (INDEPENDENT_AMBULATORY_CARE_PROVIDER_SITE_OTHER): Payer: Medicare Other | Admitting: *Deleted

## 2018-04-10 DIAGNOSIS — Z5181 Encounter for therapeutic drug level monitoring: Secondary | ICD-10-CM

## 2018-04-10 DIAGNOSIS — I4821 Permanent atrial fibrillation: Secondary | ICD-10-CM

## 2018-04-10 DIAGNOSIS — I4891 Unspecified atrial fibrillation: Secondary | ICD-10-CM | POA: Diagnosis not present

## 2018-04-10 LAB — POCT INR: INR: 3 (ref 2.0–3.0)

## 2018-04-10 NOTE — Patient Instructions (Signed)
Description   Today take 1/2 tablet then continue on same dosage 1 tablet everyday.  Recheck INR in 6 weeks. Coumadin Clinic [phone]

## 2018-04-16 DIAGNOSIS — L821 Other seborrheic keratosis: Secondary | ICD-10-CM | POA: Diagnosis not present

## 2018-04-16 DIAGNOSIS — Z85828 Personal history of other malignant neoplasm of skin: Secondary | ICD-10-CM | POA: Diagnosis not present

## 2018-04-16 DIAGNOSIS — L853 Xerosis cutis: Secondary | ICD-10-CM | POA: Diagnosis not present

## 2018-04-16 DIAGNOSIS — L300 Nummular dermatitis: Secondary | ICD-10-CM | POA: Diagnosis not present

## 2018-04-16 DIAGNOSIS — L570 Actinic keratosis: Secondary | ICD-10-CM | POA: Diagnosis not present

## 2018-04-16 DIAGNOSIS — D1801 Hemangioma of skin and subcutaneous tissue: Secondary | ICD-10-CM | POA: Diagnosis not present

## 2018-05-21 ENCOUNTER — Ambulatory Visit (INDEPENDENT_AMBULATORY_CARE_PROVIDER_SITE_OTHER): Payer: Medicare Other

## 2018-05-21 DIAGNOSIS — I4821 Permanent atrial fibrillation: Secondary | ICD-10-CM | POA: Diagnosis not present

## 2018-05-21 DIAGNOSIS — Z5181 Encounter for therapeutic drug level monitoring: Secondary | ICD-10-CM

## 2018-05-21 DIAGNOSIS — I4891 Unspecified atrial fibrillation: Secondary | ICD-10-CM

## 2018-05-21 LAB — POCT INR: INR: 2.3 (ref 2.0–3.0)

## 2018-05-21 NOTE — Patient Instructions (Signed)
Description   Continue on same dosage 1 tablet everyday.  Recheck INR in 6 weeks. Coumadin Clinic [phone]

## 2018-06-10 ENCOUNTER — Other Ambulatory Visit: Payer: Self-pay | Admitting: Cardiology

## 2018-07-03 ENCOUNTER — Ambulatory Visit (INDEPENDENT_AMBULATORY_CARE_PROVIDER_SITE_OTHER): Payer: Medicare Other | Admitting: *Deleted

## 2018-07-03 DIAGNOSIS — Z5181 Encounter for therapeutic drug level monitoring: Secondary | ICD-10-CM | POA: Diagnosis not present

## 2018-07-03 DIAGNOSIS — I4891 Unspecified atrial fibrillation: Secondary | ICD-10-CM

## 2018-07-03 DIAGNOSIS — I4821 Permanent atrial fibrillation: Secondary | ICD-10-CM

## 2018-07-03 LAB — POCT INR: INR: 2.6 (ref 2.0–3.0)

## 2018-07-03 NOTE — Patient Instructions (Signed)
Description   Continue taking 1 tablet everyday.  Recheck INR in 6 weeks. Coumadin Clinic [phone]

## 2018-07-30 ENCOUNTER — Other Ambulatory Visit: Payer: Self-pay | Admitting: Cardiology

## 2018-08-01 ENCOUNTER — Encounter: Payer: Self-pay | Admitting: Cardiology

## 2018-08-01 ENCOUNTER — Ambulatory Visit (INDEPENDENT_AMBULATORY_CARE_PROVIDER_SITE_OTHER): Payer: Medicare Other | Admitting: Cardiology

## 2018-08-01 VITALS — BP 156/100 | HR 94 | Ht 74.0 in | Wt 173.1 lb

## 2018-08-01 DIAGNOSIS — I4821 Permanent atrial fibrillation: Secondary | ICD-10-CM

## 2018-08-01 DIAGNOSIS — E7800 Pure hypercholesterolemia, unspecified: Secondary | ICD-10-CM

## 2018-08-01 DIAGNOSIS — I10 Essential (primary) hypertension: Secondary | ICD-10-CM | POA: Diagnosis not present

## 2018-08-01 NOTE — Patient Instructions (Signed)
Medication Instructions:  Your physician recommends that you continue on your current medications as directed. Please refer to the Current Medication list given to you today.  If you need a refill on your cardiac medications before your next appointment, please call your pharmacy.   Lab work: None If you have labs (blood work) drawn today and your tests are completely normal, you will receive your results only by: Marland Kitchen MyChart Message (if you have MyChart) OR . A paper copy in the mail If you have any lab test that is abnormal or we need to change your treatment, we will call you to review the results.  Testing/Procedures: None  Follow-Up: At [facility], you and your health needs are our priority.  As part of our continuing mission to provide you with exceptional heart care, we have created designated Provider Care Teams.  These Care Teams include your primary Cardiologist (physician) and Advanced Practice Providers (APPs -  Physician Assistants and Nurse Practitioners) who all work together to provide you with the care you need, when you need it. You will need a follow up appointment in 1 years.  Please call our office 2 months in advance to schedule this appointment.  You may see Fransico Him, MD or one of the following Advanced Practice Providers on your designated Care Team:   [place], PA-C Melina Copa, PA-C . Ermalinda Barrios, PA-C  Check blood pressure for 1 week, 1 hours after taking medication for 1 week and MyChart the results.

## 2018-08-01 NOTE — Progress Notes (Signed)
Cardiology Office Note:    Date:  08/01/2018   ID:  Kyle Kelley, DOB Jun 19, 1945, MRN [id number]  PCP:  Josetta Huddle, MD  Cardiologist:  Fransico Him, MD    Referring MD: Josetta Huddle, MD   Chief Complaint  Patient presents with  . Atrial Fibrillation  . Hypertension    History of Present Illness:    Kyle Kelley is a 73 y.o. male with a hx of chronic atrial fibrillation on systemic anticoagulation and HTN.  nHe is here today for followup and is doing well.  He denies any chest pain or pressure, SOB, DOE, PND, orthopnea, LE edema, dizziness, palpitations or syncope. He is compliant with his meds and is tolerating meds with no SE.    Past Medical History:  Diagnosis Date  . Dyslipidemia   . Hypertension   . Inguinal hernia 2010   Small asymptomatic L inguinal Hernia  . Permanent atrial fibrillation    atrial fib   . Pneumonia    hx of   . Prostate cancer ([place]) 07/2012   s/p prostatectomy  . PSA elevation    workup per Dr. Rana Snare, March 2014  . Sigmoid diverticulosis    on colonoscopy in 2010  . Skin cancer 02/2012   Dr Holley Dexter  . Tobacco dependence    rarely smokes-3 cigs per week    Past Surgical History:  Procedure Laterality Date  . CARDIOVERSION  08/05/2005   Direct Current Cardioversion  . COLONOSCOPY  2010  . Direct current Cardioversion  08/05/2005  . PROSTATE BIOPSY  09/06/2012   Dr. Rana Snare  . ROBOT ASSISTED LAPAROSCOPIC RADICAL PROSTATECTOMY N/A 10/31/2012   Procedure: ROBOTIC ASSISTED LAPAROSCOPIC RADICAL PROSTATECTOMY;  Surgeon: Bernestine Amass, MD;  Location: [facility];  Service: Urology;  Laterality: N/A;  . TONSILLECTOMY AND ADENOIDECTOMY    . TONSILLECTOMY AND ADENOIDECTOMY      Current Medications: Current Meds  Medication Sig  . diltiazem (CARDIZEM CD) 300 MG 24 hr capsule Take 300 mg by mouth daily.   Marland Kitchen EPINEPHrine (EPIPEN IJ) Inject 1 application as directed as needed.  . loratadine (CLARITIN) 10 MG tablet Take 10 mg by mouth  daily as needed for allergies.  . metoprolol succinate (TOPROL-XL) 25 MG 24 hr tablet TAKE 1 TABLET BY MOUTH EVERY DAY  . warfarin (COUMADIN) 5 MG tablet TAKE AS DIRECTED BY COUMADIN CLINIC     Allergies:   Ace inhibitors; Rythmol [propafenone]; and Nitrofurantoin   Social History   Socioeconomic History  . Marital status: Married    Spouse name: Not on file  . Number of children: Not on file  . Years of education: Not on file  . Highest education level: Not on file  Occupational History  . Not on file  Social Needs  . Financial resource strain: Not on file  . Food insecurity:    Worry: Not on file    Inability: Not on file  . Transportation needs:    Medical: Not on file    Non-medical: Not on file  Tobacco Use  . Smoking status: Current Every Day Smoker    Packs/day: 0.50    Years: 50.00    Pack years: 25.00    Types: Cigarettes  . Smokeless tobacco: Never Used  Substance and Sexual Activity  . Alcohol use: Yes    Comment: rare, heavier in the past  . Drug use: No  . Sexual activity: Never  Lifestyle  . Physical activity:    Days  per week: Not on file    Minutes per session: Not on file  . Stress: Not on file  Relationships  . Social connections:    Talks on phone: Not on file    Gets together: Not on file    Attends religious service: Not on file    Active member of club or organization: Not on file    Attends meetings of clubs or organizations: Not on file    Relationship status: Not on file  Other Topics Concern  . Not on file  Social History Narrative   Tobacco Use cigarettes: Current Smoker   Smoking: Yes, less than 1/2 pk per month   Alcohol: Yes - No Alcohol since June 2005   Caffeine: yes   No recreational drug use   Occupation: Employed   Marital Status: Married 36 years, wife has MS   Children: none     Family History: The patient's family history includes Cancer in his father; Heart disease in his father; Hyperlipidemia in his father;  Hypertension in his mother.  ROS:   Please see the history of present illness.    ROS  All other systems reviewed and negative.   EKGs/Labs/Other Studies Reviewed:    The following studies were reviewed today: none  EKG:  EKG is  ordered today.  The ekg ordered today demonstrates atrial fibrillation with CVR at 94bpm with occ PVC and nonspecific ST abnormality  Recent Labs: No results found for requested labs within last 8760 hours.   Recent Lipid Panel No results found for: CHOL, TRIG, HDL, CHOLHDL, VLDL, LDLCALC, LDLDIRECT  Physical Exam:    VS:  BP (!) 156/100   Pulse 94   Ht 6\' 2"  (1.88 m)   Wt 173 lb 1.9 oz (78.5 kg)   BMI 22.23 kg/m     Wt Readings from Last 3 Encounters:  08/01/18 173 lb 1.9 oz (78.5 kg)  07/10/17 177 lb 12.8 oz (80.6 kg)  09/05/16 174 lb 12.8 oz (79.3 kg)     GEN:  Well nourished, well developed in no acute distress HEENT: Normal NECK: No JVD; No carotid bruits LYMPHATICS: No lymphadenopathy CARDIAC: RRR, no murmurs, rubs, gallops RESPIRATORY:  Clear to auscultation without rales, wheezing or rhonchi  ABDOMEN: Soft, non-tender, non-distended MUSCULOSKELETAL:  No edema; No deformity  SKIN: Warm and dry NEUROLOGIC:  Alert and oriented x 3 PSYCHIATRIC:  Normal affect   ASSESSMENT:    1. Permanent atrial fibrillation   2. Essential hypertension, benign   3. Pure hypercholesterolemia    PLAN:    In order of problems listed above:  1.  Permanent atrial fibrillation - HR is well controlled on exam today.  He will continue on toprol XL 25mg  daily, Cardiem CD 300mg  daily and warfarin.   He denies any bleeding problems.   2.  HTN - Bp is poorly controlled on exam today but he says that he has been rushing around. His BP was checked a few weeks ago and was 128/90mmHg. I have asked him to check his BP and Hr daily for a weekand call with the results.   He will continue on toprol XL 25mg  daily, Cardiem CD 300mg  daily.  3.  Hyperlipidemia - his  LDL was 151 a year ago.  This is followed by his PCP.  His goal is ulitmately < 100.   Medication Adjustments/Labs and Tests Ordered: Current medicines are reviewed at length with the patient today.  Concerns regarding medicines are outlined above.  Orders  Placed This Encounter  Procedures  . EKG 12-Lead   No orders of the defined types were placed in this encounter.   Signed, Fransico Him, MD  08/01/2018 9:25 AM    [place]

## 2018-08-14 ENCOUNTER — Ambulatory Visit (INDEPENDENT_AMBULATORY_CARE_PROVIDER_SITE_OTHER): Payer: Medicare Other | Admitting: Pharmacist

## 2018-08-14 ENCOUNTER — Other Ambulatory Visit: Payer: Self-pay

## 2018-08-14 DIAGNOSIS — I4891 Unspecified atrial fibrillation: Secondary | ICD-10-CM

## 2018-08-14 LAB — POCT INR: INR: 2.5 (ref 2.0–3.0)

## 2018-08-14 NOTE — Patient Instructions (Signed)
Description   Continue taking 1 tablet everyday.  Recheck INR in 8 weeks. Coumadin Clinic [phone]

## 2018-09-12 DIAGNOSIS — C61 Malignant neoplasm of prostate: Secondary | ICD-10-CM | POA: Diagnosis not present

## 2018-09-19 DIAGNOSIS — Z8546 Personal history of malignant neoplasm of prostate: Secondary | ICD-10-CM | POA: Diagnosis not present

## 2018-09-19 DIAGNOSIS — N5201 Erectile dysfunction due to arterial insufficiency: Secondary | ICD-10-CM | POA: Diagnosis not present

## 2018-10-08 ENCOUNTER — Telehealth: Payer: Self-pay

## 2018-10-08 NOTE — Telephone Encounter (Signed)

## 2018-10-09 ENCOUNTER — Ambulatory Visit (INDEPENDENT_AMBULATORY_CARE_PROVIDER_SITE_OTHER): Payer: Medicare Other | Admitting: Pharmacist

## 2018-10-09 ENCOUNTER — Other Ambulatory Visit: Payer: Self-pay

## 2018-10-09 DIAGNOSIS — I4821 Permanent atrial fibrillation: Secondary | ICD-10-CM | POA: Diagnosis not present

## 2018-10-09 DIAGNOSIS — Z5181 Encounter for therapeutic drug level monitoring: Secondary | ICD-10-CM

## 2018-10-09 LAB — POCT INR: INR: 2.6 (ref 2.0–3.0)

## 2018-10-25 ENCOUNTER — Other Ambulatory Visit: Payer: Self-pay | Admitting: Cardiology

## 2018-10-25 DIAGNOSIS — Z85828 Personal history of other malignant neoplasm of skin: Secondary | ICD-10-CM | POA: Diagnosis not present

## 2018-10-25 DIAGNOSIS — D1801 Hemangioma of skin and subcutaneous tissue: Secondary | ICD-10-CM | POA: Diagnosis not present

## 2018-10-25 DIAGNOSIS — D2272 Melanocytic nevi of left lower limb, including hip: Secondary | ICD-10-CM | POA: Diagnosis not present

## 2018-10-25 DIAGNOSIS — L821 Other seborrheic keratosis: Secondary | ICD-10-CM | POA: Diagnosis not present

## 2018-10-25 DIAGNOSIS — D225 Melanocytic nevi of trunk: Secondary | ICD-10-CM | POA: Diagnosis not present

## 2018-12-08 ENCOUNTER — Other Ambulatory Visit: Payer: Self-pay | Admitting: Cardiology

## 2018-12-17 ENCOUNTER — Telehealth: Payer: Self-pay | Admitting: *Deleted

## 2018-12-17 ENCOUNTER — Telehealth: Payer: Self-pay

## 2018-12-17 NOTE — Telephone Encounter (Signed)
lmom for prescreen  

## 2018-12-17 NOTE — Telephone Encounter (Signed)

## 2018-12-18 ENCOUNTER — Ambulatory Visit (INDEPENDENT_AMBULATORY_CARE_PROVIDER_SITE_OTHER): Payer: Medicare Other

## 2018-12-18 ENCOUNTER — Other Ambulatory Visit: Payer: Self-pay

## 2018-12-18 DIAGNOSIS — Z5181 Encounter for therapeutic drug level monitoring: Secondary | ICD-10-CM | POA: Diagnosis not present

## 2018-12-18 DIAGNOSIS — I4821 Permanent atrial fibrillation: Secondary | ICD-10-CM | POA: Diagnosis not present

## 2018-12-18 LAB — POCT INR: INR: 2.2 (ref 2.0–3.0)

## 2018-12-18 NOTE — Patient Instructions (Signed)
Description   Continue taking 1 tablet everyday.  Recheck INR in 10 weeks. Coumadin Clinic [phone]

## 2019-02-07 DIAGNOSIS — Z23 Encounter for immunization: Secondary | ICD-10-CM | POA: Diagnosis not present

## 2019-02-26 ENCOUNTER — Ambulatory Visit (INDEPENDENT_AMBULATORY_CARE_PROVIDER_SITE_OTHER): Payer: Medicare Other | Admitting: *Deleted

## 2019-02-26 ENCOUNTER — Other Ambulatory Visit: Payer: Self-pay

## 2019-02-26 DIAGNOSIS — Z5181 Encounter for therapeutic drug level monitoring: Secondary | ICD-10-CM

## 2019-02-26 DIAGNOSIS — I4821 Permanent atrial fibrillation: Secondary | ICD-10-CM

## 2019-02-26 LAB — POCT INR: INR: 3 (ref 2.0–3.0)

## 2019-02-26 NOTE — Patient Instructions (Signed)
Description   Continue taking 1 tablet everyday.  Recheck INR in 10 weeks. Coumadin Clinic [phone]     

## 2019-03-25 DIAGNOSIS — L309 Dermatitis, unspecified: Secondary | ICD-10-CM | POA: Diagnosis not present

## 2019-03-25 DIAGNOSIS — C44719 Basal cell carcinoma of skin of left lower limb, including hip: Secondary | ICD-10-CM | POA: Diagnosis not present

## 2019-03-25 DIAGNOSIS — D225 Melanocytic nevi of trunk: Secondary | ICD-10-CM | POA: Diagnosis not present

## 2019-03-25 DIAGNOSIS — Z85828 Personal history of other malignant neoplasm of skin: Secondary | ICD-10-CM | POA: Diagnosis not present

## 2019-03-25 DIAGNOSIS — D485 Neoplasm of uncertain behavior of skin: Secondary | ICD-10-CM | POA: Diagnosis not present

## 2019-03-25 DIAGNOSIS — D1801 Hemangioma of skin and subcutaneous tissue: Secondary | ICD-10-CM | POA: Diagnosis not present

## 2019-03-25 DIAGNOSIS — L812 Freckles: Secondary | ICD-10-CM | POA: Diagnosis not present

## 2019-03-25 DIAGNOSIS — L821 Other seborrheic keratosis: Secondary | ICD-10-CM | POA: Diagnosis not present

## 2019-03-25 DIAGNOSIS — L820 Inflamed seborrheic keratosis: Secondary | ICD-10-CM | POA: Diagnosis not present

## 2019-03-26 DIAGNOSIS — Z0001 Encounter for general adult medical examination with abnormal findings: Secondary | ICD-10-CM | POA: Diagnosis not present

## 2019-03-26 DIAGNOSIS — F17210 Nicotine dependence, cigarettes, uncomplicated: Secondary | ICD-10-CM | POA: Diagnosis not present

## 2019-03-26 DIAGNOSIS — D6869 Other thrombophilia: Secondary | ICD-10-CM | POA: Diagnosis not present

## 2019-03-26 DIAGNOSIS — I482 Chronic atrial fibrillation, unspecified: Secondary | ICD-10-CM | POA: Diagnosis not present

## 2019-03-26 DIAGNOSIS — E782 Mixed hyperlipidemia: Secondary | ICD-10-CM | POA: Diagnosis not present

## 2019-03-26 DIAGNOSIS — Z79899 Other long term (current) drug therapy: Secondary | ICD-10-CM | POA: Diagnosis not present

## 2019-03-26 DIAGNOSIS — K4020 Bilateral inguinal hernia, without obstruction or gangrene, not specified as recurrent: Secondary | ICD-10-CM | POA: Diagnosis not present

## 2019-03-26 DIAGNOSIS — J309 Allergic rhinitis, unspecified: Secondary | ICD-10-CM | POA: Diagnosis not present

## 2019-03-26 DIAGNOSIS — Z1389 Encounter for screening for other disorder: Secondary | ICD-10-CM | POA: Diagnosis not present

## 2019-03-26 DIAGNOSIS — I10 Essential (primary) hypertension: Secondary | ICD-10-CM | POA: Diagnosis not present

## 2019-03-26 DIAGNOSIS — E559 Vitamin D deficiency, unspecified: Secondary | ICD-10-CM | POA: Diagnosis not present

## 2019-03-26 DIAGNOSIS — C61 Malignant neoplasm of prostate: Secondary | ICD-10-CM | POA: Diagnosis not present

## 2019-04-17 DIAGNOSIS — C61 Malignant neoplasm of prostate: Secondary | ICD-10-CM | POA: Diagnosis not present

## 2019-04-24 DIAGNOSIS — C61 Malignant neoplasm of prostate: Secondary | ICD-10-CM | POA: Diagnosis not present

## 2019-05-03 ENCOUNTER — Telehealth: Payer: Self-pay | Admitting: *Deleted

## 2019-05-03 NOTE — Telephone Encounter (Signed)
   Primary Cardiologist: Fransico Him, MD  Chart reviewed as part of pre-operative protocol coverage. Given past medical history and time since last visit, based on ACC/AHA guidelines, Kyle Kelley would be at acceptable risk for the planned procedure without further cardiovascular testing.   Per pharmacy, patient takes warfarin for afib with CHADS2VASc score of 2 (age, HTN).  It would be acceptable to hold warfarin for 5 days prior to procedure.  I will route this recommendation to the requesting party via Epic fax function and remove from pre-op pool.  Please call with questions.  Kathyrn Drown, NP 05/03/2019, 2:58 PM

## 2019-05-03 NOTE — Telephone Encounter (Signed)
Pt takes warfarin for afib with CHADS2VASc score of 2 (age, HTN). Ok to hold warfarin for 5 days prior to procedure.

## 2019-05-03 NOTE — Telephone Encounter (Signed)
   [facility] Pre-operative Risk Assessment    Request for surgical clearance:  1. What type of surgery is being performed?  Colonoscopy   2. When is this surgery scheduled?  05/15/2019   3. What type of clearance is required (medical clearance vs. Pharmacy clearance to hold med vs. Both)?  Pharamacy  4. Are there any medications that need to be held prior to surgery and how long? Coumadin X's 5 days   5. Practice name and name of physician performing surgery?  [facility]    6. What is your office phone number [phone]     7.   What is your office fax number [phone]  8.   Anesthesia type (None, local, MAC, general) ?  N/A   Jeanann Lewandowsky 05/03/2019, 11:57 AM  _________________________________________________________________   (provider comments below)

## 2019-05-07 ENCOUNTER — Other Ambulatory Visit: Payer: Self-pay

## 2019-05-07 ENCOUNTER — Ambulatory Visit (INDEPENDENT_AMBULATORY_CARE_PROVIDER_SITE_OTHER): Payer: Medicare Other | Admitting: *Deleted

## 2019-05-07 DIAGNOSIS — Z5181 Encounter for therapeutic drug level monitoring: Secondary | ICD-10-CM

## 2019-05-07 DIAGNOSIS — I4821 Permanent atrial fibrillation: Secondary | ICD-10-CM | POA: Diagnosis not present

## 2019-05-07 LAB — POCT INR: INR: 2.3 (ref 2.0–3.0)

## 2019-05-07 NOTE — Progress Notes (Signed)
Per clearance note 05/03/2019 pt can hold coumadin for 5 days.

## 2019-05-07 NOTE — Patient Instructions (Signed)
Description    Continue taking 1 tablet daily. Start holding Coumadin on 12/11-12/15 .On 12/16 Resume Coumadin in the evening or as directed by doctor (take an extra half tablet with usual dose for 2 days then resume normal dose). Recheck INR 1 week post procedure. Coumadin Clinic [phone]

## 2019-05-10 DIAGNOSIS — Z1159 Encounter for screening for other viral diseases: Secondary | ICD-10-CM | POA: Diagnosis not present

## 2019-05-22 ENCOUNTER — Ambulatory Visit (INDEPENDENT_AMBULATORY_CARE_PROVIDER_SITE_OTHER): Payer: Medicare Other | Admitting: Pharmacist

## 2019-05-22 ENCOUNTER — Other Ambulatory Visit: Payer: Self-pay

## 2019-05-22 DIAGNOSIS — Z5181 Encounter for therapeutic drug level monitoring: Secondary | ICD-10-CM

## 2019-05-22 DIAGNOSIS — I4821 Permanent atrial fibrillation: Secondary | ICD-10-CM | POA: Diagnosis not present

## 2019-05-22 LAB — POCT INR: INR: 1.8 — AB (ref 2.0–3.0)

## 2019-05-22 NOTE — Patient Instructions (Signed)
Description   Take 1.5 tablets today, then continue taking 1 tablet daily. Recheck INR 4 weeks. Coumadin Clinic [phone]

## 2019-06-03 ENCOUNTER — Other Ambulatory Visit: Payer: Self-pay | Admitting: Cardiology

## 2019-06-04 DIAGNOSIS — Z1212 Encounter for screening for malignant neoplasm of rectum: Secondary | ICD-10-CM | POA: Diagnosis not present

## 2019-06-04 DIAGNOSIS — Z1211 Encounter for screening for malignant neoplasm of colon: Secondary | ICD-10-CM | POA: Diagnosis not present

## 2019-06-18 ENCOUNTER — Other Ambulatory Visit: Payer: Self-pay

## 2019-06-18 ENCOUNTER — Ambulatory Visit (INDEPENDENT_AMBULATORY_CARE_PROVIDER_SITE_OTHER): Payer: Medicare Other | Admitting: *Deleted

## 2019-06-18 DIAGNOSIS — I4821 Permanent atrial fibrillation: Secondary | ICD-10-CM

## 2019-06-18 DIAGNOSIS — Z5181 Encounter for therapeutic drug level monitoring: Secondary | ICD-10-CM

## 2019-06-18 LAB — POCT INR: INR: 2.5 (ref 2.0–3.0)

## 2019-06-18 NOTE — Patient Instructions (Addendum)
Description   Continue taking 1 tablet daily. Recheck INR 7 weeks. Coumadin Clinic [phone]

## 2019-08-08 ENCOUNTER — Ambulatory Visit (INDEPENDENT_AMBULATORY_CARE_PROVIDER_SITE_OTHER): Payer: Medicare Other | Admitting: Cardiology

## 2019-08-08 ENCOUNTER — Ambulatory Visit (INDEPENDENT_AMBULATORY_CARE_PROVIDER_SITE_OTHER): Payer: Medicare Other | Admitting: *Deleted

## 2019-08-08 ENCOUNTER — Encounter: Payer: Self-pay | Admitting: Cardiology

## 2019-08-08 ENCOUNTER — Other Ambulatory Visit: Payer: Self-pay

## 2019-08-08 VITALS — BP 120/82 | HR 120 | Ht 74.0 in | Wt 176.0 lb

## 2019-08-08 DIAGNOSIS — Z5181 Encounter for therapeutic drug level monitoring: Secondary | ICD-10-CM | POA: Diagnosis not present

## 2019-08-08 DIAGNOSIS — I10 Essential (primary) hypertension: Secondary | ICD-10-CM

## 2019-08-08 DIAGNOSIS — I4821 Permanent atrial fibrillation: Secondary | ICD-10-CM

## 2019-08-08 DIAGNOSIS — D6869 Other thrombophilia: Secondary | ICD-10-CM

## 2019-08-08 LAB — POCT INR: INR: 2.3 (ref 2.0–3.0)

## 2019-08-08 NOTE — Patient Instructions (Signed)
Description   Continue taking 1 tablet daily. Recheck INR 8 weeks. Coumadin Clinic [phone]     

## 2019-08-08 NOTE — Patient Instructions (Signed)

## 2019-08-08 NOTE — Progress Notes (Signed)
Cardiology Office Note:    Date:  08/08/2019   ID:  Kyle Kelley, DOB [birth year], MRN [id number]  PCP:  Josetta Huddle, MD  Cardiologist:  Fransico Him, MD    Referring MD: Josetta Huddle, MD   No chief complaint on file.   History of Present Illness:    Kyle Kelley is a 74 y.o. male with a hx of chronic atrial fibrillation on systemic anticoagulation and HTN. He is here today for followup and is doing well.  He denies any chest pain or pressure, SOB, DOE, PND, orthopnea, LE edema, dizziness, palpitations or syncope. He is compliant with his meds and is tolerating meds with no SE.    Past Medical History:  Diagnosis Date  . Dyslipidemia   . Hypertension   . Inguinal hernia 2010   Small asymptomatic L inguinal Hernia  . Permanent atrial fibrillation (HCC)    atrial fib   . Pneumonia    hx of   . Prostate cancer ([place]) 07/2012   s/p prostatectomy  . PSA elevation    workup per Dr. Rana Snare, March 2014  . Sigmoid diverticulosis    on colonoscopy in 2010  . Skin cancer 02/2012   Dr Holley Dexter  . Tobacco dependence    rarely smokes-3 cigs per week    Past Surgical History:  Procedure Laterality Date  . CARDIOVERSION  08/05/2005   Direct Current Cardioversion  . COLONOSCOPY  2010  . Direct current Cardioversion  08/05/2005  . PROSTATE BIOPSY  09/06/2012   Dr. Rana Snare  . ROBOT ASSISTED LAPAROSCOPIC RADICAL PROSTATECTOMY N/A 10/31/2012   Procedure: ROBOTIC ASSISTED LAPAROSCOPIC RADICAL PROSTATECTOMY;  Surgeon: Bernestine Amass, MD;  Location: [facility];  Service: Urology;  Laterality: N/A;  . TONSILLECTOMY AND ADENOIDECTOMY    . TONSILLECTOMY AND ADENOIDECTOMY      Current Medications: Current Meds  Medication Sig  . diltiazem (TIADYLT ER) 300 MG 24 hr capsule Take 300 mg by mouth daily.  Marland Kitchen EPINEPHrine (EPIPEN IJ) Inject 1 application as directed as needed.  . loratadine (CLARITIN) 10 MG tablet Take 10 mg by mouth daily as needed for allergies.  . metoprolol succinate  (TOPROL-XL) 25 MG 24 hr tablet TAKE 1 TABLET BY MOUTH EVERY DAY  . warfarin (COUMADIN) 5 MG tablet TAKE AS DIRECTED BY COUMADIN CLINIC     Allergies:   Ace inhibitors, Rythmol [propafenone], and Nitrofurantoin   Social History   Socioeconomic History  . Marital status: Married    Spouse name: Not on file  . Number of children: Not on file  . Years of education: Not on file  . Highest education level: Not on file  Occupational History  . Not on file  Tobacco Use  . Smoking status: Current Every Day Smoker    Packs/day: 0.50    Years: 50.00    Pack years: 25.00    Types: Cigarettes  . Smokeless tobacco: Never Used  Substance and Sexual Activity  . Alcohol use: Yes    Comment: rare, heavier in the past  . Drug use: No  . Sexual activity: Never  Other Topics Concern  . Not on file  Social History Narrative   Tobacco Use cigarettes: Current Smoker   Smoking: Yes, less than 1/2 pk per month   Alcohol: Yes - No Alcohol since June 2005   Caffeine: yes   No recreational drug use   Occupation: Employed   Marital Status: Married 31 years, wife has MS   Children:  none   Social Determinants of Health   Financial Resource Strain:   . Difficulty of Paying Living Expenses:   Food Insecurity:   . Worried About Charity fundraiser in the Last Year:   . Arboriculturist in the Last Year:   Transportation Needs:   . Film/video editor (Medical):   Marland Kitchen Lack of Transportation (Non-Medical):   Physical Activity:   . Days of Exercise per Week:   . Minutes of Exercise per Session:   Stress:   . Feeling of Stress :   Social Connections:   . Frequency of Communication with Friends and Family:   . Frequency of Social Gatherings with Friends and Family:   . Attends Religious Services:   . Active Member of Clubs or Organizations:   . Attends Archivist Meetings:   Marland Kitchen Marital Status:      Family History: The patient's family history includes Cancer in his father; Heart  disease in his father; Hyperlipidemia in his father; Hypertension in his mother.  ROS:   Please see the history of present illness.    ROS  All other systems reviewed and negative.   EKGs/Labs/Other Studies Reviewed:    The following studies were reviewed today: EKG  EKG:  EKG is  ordered today.  The ekg ordered today demonstrates atrial fibrillation at 91bpm with no ST changes  Recent Labs: No results found for requested labs within last 8760 hours.   Recent Lipid Panel No results found for: CHOL, TRIG, HDL, CHOLHDL, VLDL, LDLCALC, LDLDIRECT  Physical Exam:    VS:  BP 120/82   Pulse (!) 120   Ht 6\' 2"  (1.88 m)   Wt 176 lb (79.8 kg)   SpO2 99%   BMI 22.60 kg/m     Wt Readings from Last 3 Encounters:  08/08/19 176 lb (79.8 kg)  08/01/18 173 lb 1.9 oz (78.5 kg)  07/10/17 177 lb 12.8 oz (80.6 kg)     GEN:  Well nourished, well developed in no acute distress HEENT: Normal NECK: No JVD; No carotid bruits LYMPHATICS: No lymphadenopathy CARDIAC: irregularly irregular no murmurs, rubs, gallops RESPIRATORY:  Clear to auscultation without rales, wheezing or rhonchi  ABDOMEN: Soft, non-tender, non-distended MUSCULOSKELETAL:  No edema; No deformity  SKIN: Warm and dry NEUROLOGIC:  Alert and oriented x 3 PSYCHIATRIC:  Normal affect   ASSESSMENT:    1. Permanent atrial fibrillation ([place])   2. Essential hypertension, benign   3. Secondary hypercoagulable state ([place])    PLAN:    In order of problems listed above:  1.  Permanent atrial fibrillation -HR is well controlled -continue Diltiazem ER and warfarin -no bleeding issues on anticoagulation  2.  HTN -BP controlled -continue Toprol XL 25mg  daily and Cardizem ER 300mg  daily  3.  Secondary hypercoagulable state  -no bleeding problems on coumadin -he has a coumadin appt today  Medication Adjustments/Labs and Tests Ordered: Current medicines are reviewed at length with the patient today.  Concerns regarding  medicines are outlined above.  Orders Placed This Encounter  Procedures  . EKG 12-Lead   No orders of the defined types were placed in this encounter.   Signed, Fransico Him, MD  08/08/2019 11:42 AM    [place]

## 2019-09-16 DIAGNOSIS — Z85828 Personal history of other malignant neoplasm of skin: Secondary | ICD-10-CM | POA: Diagnosis not present

## 2019-09-16 DIAGNOSIS — D485 Neoplasm of uncertain behavior of skin: Secondary | ICD-10-CM | POA: Diagnosis not present

## 2019-09-16 DIAGNOSIS — L821 Other seborrheic keratosis: Secondary | ICD-10-CM | POA: Diagnosis not present

## 2019-09-16 DIAGNOSIS — D0471 Carcinoma in situ of skin of right lower limb, including hip: Secondary | ICD-10-CM | POA: Diagnosis not present

## 2019-10-01 ENCOUNTER — Ambulatory Visit (INDEPENDENT_AMBULATORY_CARE_PROVIDER_SITE_OTHER): Payer: Medicare Other | Admitting: *Deleted

## 2019-10-01 ENCOUNTER — Other Ambulatory Visit: Payer: Self-pay

## 2019-10-01 DIAGNOSIS — Z5181 Encounter for therapeutic drug level monitoring: Secondary | ICD-10-CM | POA: Diagnosis not present

## 2019-10-01 DIAGNOSIS — I4821 Permanent atrial fibrillation: Secondary | ICD-10-CM

## 2019-10-01 LAB — POCT INR: INR: 3 (ref 2.0–3.0)

## 2019-10-01 NOTE — Patient Instructions (Signed)
Description   Continue taking 1 tablet daily. Recheck INR 8 weeks. Coumadin Clinic [phone]     

## 2019-11-25 ENCOUNTER — Other Ambulatory Visit: Payer: Self-pay | Admitting: Cardiology

## 2019-11-26 ENCOUNTER — Other Ambulatory Visit: Payer: Self-pay

## 2019-11-26 ENCOUNTER — Ambulatory Visit (INDEPENDENT_AMBULATORY_CARE_PROVIDER_SITE_OTHER): Payer: Medicare Other | Admitting: *Deleted

## 2019-11-26 DIAGNOSIS — Z5181 Encounter for therapeutic drug level monitoring: Secondary | ICD-10-CM | POA: Diagnosis not present

## 2019-11-26 DIAGNOSIS — I4821 Permanent atrial fibrillation: Secondary | ICD-10-CM | POA: Diagnosis not present

## 2019-11-26 LAB — POCT INR: INR: 2.3 (ref 2.0–3.0)

## 2019-11-26 NOTE — Patient Instructions (Signed)
Description   Continue taking 1 tablet daily. Recheck INR 8 weeks. Coumadin Clinic [phone]     

## 2020-01-21 ENCOUNTER — Ambulatory Visit (INDEPENDENT_AMBULATORY_CARE_PROVIDER_SITE_OTHER): Payer: Medicare Other | Admitting: *Deleted

## 2020-01-21 ENCOUNTER — Other Ambulatory Visit: Payer: Self-pay

## 2020-01-21 DIAGNOSIS — I4821 Permanent atrial fibrillation: Secondary | ICD-10-CM

## 2020-01-21 DIAGNOSIS — Z5181 Encounter for therapeutic drug level monitoring: Secondary | ICD-10-CM

## 2020-01-21 LAB — POCT INR: INR: 2.2 (ref 2.0–3.0)

## 2020-01-21 NOTE — Patient Instructions (Signed)
Description   Continue taking 1 tablet daily. Recheck INR 8 weeks. Coumadin Clinic [phone]     

## 2020-01-23 ENCOUNTER — Other Ambulatory Visit: Payer: Self-pay | Admitting: Cardiology

## 2020-02-26 DIAGNOSIS — Z23 Encounter for immunization: Secondary | ICD-10-CM | POA: Diagnosis not present

## 2020-03-17 DIAGNOSIS — L821 Other seborrheic keratosis: Secondary | ICD-10-CM | POA: Diagnosis not present

## 2020-03-17 DIAGNOSIS — L720 Epidermal cyst: Secondary | ICD-10-CM | POA: Diagnosis not present

## 2020-03-17 DIAGNOSIS — Z85828 Personal history of other malignant neoplasm of skin: Secondary | ICD-10-CM | POA: Diagnosis not present

## 2020-03-17 DIAGNOSIS — L570 Actinic keratosis: Secondary | ICD-10-CM | POA: Diagnosis not present

## 2020-03-17 DIAGNOSIS — L812 Freckles: Secondary | ICD-10-CM | POA: Diagnosis not present

## 2020-03-18 ENCOUNTER — Ambulatory Visit (INDEPENDENT_AMBULATORY_CARE_PROVIDER_SITE_OTHER): Payer: Medicare Other | Admitting: *Deleted

## 2020-03-18 ENCOUNTER — Other Ambulatory Visit: Payer: Self-pay

## 2020-03-18 DIAGNOSIS — I4821 Permanent atrial fibrillation: Secondary | ICD-10-CM

## 2020-03-18 DIAGNOSIS — Z5181 Encounter for therapeutic drug level monitoring: Secondary | ICD-10-CM | POA: Diagnosis not present

## 2020-03-18 LAB — POCT INR: INR: 2.7 (ref 2.0–3.0)

## 2020-03-18 NOTE — Patient Instructions (Signed)
Description   Continue taking 1 tablet daily. Recheck INR 8 weeks. Coumadin Clinic [phone]     

## 2020-03-28 DIAGNOSIS — Z23 Encounter for immunization: Secondary | ICD-10-CM | POA: Diagnosis not present

## 2020-04-07 DIAGNOSIS — K5730 Diverticulosis of large intestine without perforation or abscess without bleeding: Secondary | ICD-10-CM | POA: Diagnosis not present

## 2020-04-07 DIAGNOSIS — Z1211 Encounter for screening for malignant neoplasm of colon: Secondary | ICD-10-CM | POA: Diagnosis not present

## 2020-04-07 DIAGNOSIS — K4020 Bilateral inguinal hernia, without obstruction or gangrene, not specified as recurrent: Secondary | ICD-10-CM | POA: Diagnosis not present

## 2020-04-07 DIAGNOSIS — D6869 Other thrombophilia: Secondary | ICD-10-CM | POA: Diagnosis not present

## 2020-04-07 DIAGNOSIS — I4891 Unspecified atrial fibrillation: Secondary | ICD-10-CM | POA: Diagnosis not present

## 2020-04-07 DIAGNOSIS — I10 Essential (primary) hypertension: Secondary | ICD-10-CM | POA: Diagnosis not present

## 2020-04-07 DIAGNOSIS — Z0001 Encounter for general adult medical examination with abnormal findings: Secondary | ICD-10-CM | POA: Diagnosis not present

## 2020-04-07 DIAGNOSIS — Z79899 Other long term (current) drug therapy: Secondary | ICD-10-CM | POA: Diagnosis not present

## 2020-04-07 DIAGNOSIS — E559 Vitamin D deficiency, unspecified: Secondary | ICD-10-CM | POA: Diagnosis not present

## 2020-04-07 DIAGNOSIS — Z91030 Bee allergy status: Secondary | ICD-10-CM | POA: Diagnosis not present

## 2020-04-07 DIAGNOSIS — E782 Mixed hyperlipidemia: Secondary | ICD-10-CM | POA: Diagnosis not present

## 2020-04-07 DIAGNOSIS — J309 Allergic rhinitis, unspecified: Secondary | ICD-10-CM | POA: Diagnosis not present

## 2020-04-15 DIAGNOSIS — C61 Malignant neoplasm of prostate: Secondary | ICD-10-CM | POA: Diagnosis not present

## 2020-04-22 DIAGNOSIS — Z8546 Personal history of malignant neoplasm of prostate: Secondary | ICD-10-CM | POA: Diagnosis not present

## 2020-05-12 ENCOUNTER — Other Ambulatory Visit: Payer: Self-pay

## 2020-05-12 ENCOUNTER — Ambulatory Visit (INDEPENDENT_AMBULATORY_CARE_PROVIDER_SITE_OTHER): Payer: Medicare Other | Admitting: *Deleted

## 2020-05-12 DIAGNOSIS — Z5181 Encounter for therapeutic drug level monitoring: Secondary | ICD-10-CM | POA: Diagnosis not present

## 2020-05-12 DIAGNOSIS — I4821 Permanent atrial fibrillation: Secondary | ICD-10-CM | POA: Diagnosis not present

## 2020-05-12 LAB — POCT INR: INR: 2.7 (ref 2.0–3.0)

## 2020-05-12 NOTE — Patient Instructions (Signed)
Description   Continue taking 1 tablet daily. Recheck INR 8 weeks. Coumadin Clinic [phone]     

## 2020-05-18 ENCOUNTER — Other Ambulatory Visit: Payer: Self-pay | Admitting: Cardiology

## 2020-07-07 ENCOUNTER — Ambulatory Visit (INDEPENDENT_AMBULATORY_CARE_PROVIDER_SITE_OTHER): Payer: Medicare Other | Admitting: *Deleted

## 2020-07-07 ENCOUNTER — Other Ambulatory Visit: Payer: Self-pay

## 2020-07-07 DIAGNOSIS — I4821 Permanent atrial fibrillation: Secondary | ICD-10-CM | POA: Diagnosis not present

## 2020-07-07 DIAGNOSIS — Z5181 Encounter for therapeutic drug level monitoring: Secondary | ICD-10-CM | POA: Diagnosis not present

## 2020-07-07 LAB — POCT INR: INR: 2.8 (ref 2.0–3.0)

## 2020-07-07 NOTE — Patient Instructions (Signed)
Description   Continue taking 1 tablet daily. Recheck INR 8 weeks. Coumadin Clinic [phone]     

## 2020-07-16 ENCOUNTER — Other Ambulatory Visit: Payer: Self-pay | Admitting: Cardiology

## 2020-08-19 ENCOUNTER — Other Ambulatory Visit: Payer: Self-pay

## 2020-08-19 ENCOUNTER — Encounter: Payer: Self-pay | Admitting: Cardiology

## 2020-08-19 ENCOUNTER — Ambulatory Visit (INDEPENDENT_AMBULATORY_CARE_PROVIDER_SITE_OTHER): Payer: Medicare Other | Admitting: Cardiology

## 2020-08-19 VITALS — BP 120/70 | HR 86 | Ht 74.0 in | Wt 174.0 lb

## 2020-08-19 DIAGNOSIS — I4821 Permanent atrial fibrillation: Secondary | ICD-10-CM

## 2020-08-19 DIAGNOSIS — I10 Essential (primary) hypertension: Secondary | ICD-10-CM | POA: Diagnosis not present

## 2020-08-19 NOTE — Progress Notes (Signed)
Cardiology Office Note:    Date:  08/19/2020   ID:  Kyle Kelley, DOB 17-Mar-1946, MRN [id number]  PCP:  Josetta Huddle, MD  Cardiologist:  Fransico Him, MD    Referring MD: Josetta Huddle, MD   Chief Complaint  Patient presents with  . Atrial Fibrillation    History of Present Illness:    Kyle Kelley is a 75 y.o. male with a hx of chronic atrial fibrillation on systemic anticoagulation and HTN. He is here today for followup and is doing well.  He denies any chest pain or pressure, SOB, DOE, PND, orthopnea, LE edema, dizziness, palpitations or syncope. He is compliant with his meds and is tolerating meds with no SE.    Past Medical History:  Diagnosis Date  . Dyslipidemia   . Hypertension   . Inguinal hernia 2010   Small asymptomatic L inguinal Hernia  . Permanent atrial fibrillation (HCC)    atrial fib   . Pneumonia    hx of   . Prostate cancer ([place]) 07/2012   s/p prostatectomy  . PSA elevation    workup per Dr. Rana Snare, March 2014  . Sigmoid diverticulosis    on colonoscopy in 2010  . Skin cancer 02/2012   Dr Holley Dexter  . Tobacco dependence    rarely smokes-3 cigs per week    Past Surgical History:  Procedure Laterality Date  . CARDIOVERSION  08/05/2005   Direct Current Cardioversion  . COLONOSCOPY  2010  . Direct current Cardioversion  08/05/2005  . PROSTATE BIOPSY  09/06/2012   Dr. Rana Snare  . ROBOT ASSISTED LAPAROSCOPIC RADICAL PROSTATECTOMY N/A 10/31/2012   Procedure: ROBOTIC ASSISTED LAPAROSCOPIC RADICAL PROSTATECTOMY;  Surgeon: Bernestine Amass, MD;  Location: [facility];  Service: Urology;  Laterality: N/A;  . TONSILLECTOMY AND ADENOIDECTOMY    . TONSILLECTOMY AND ADENOIDECTOMY      Current Medications: Current Meds  Medication Sig  . diltiazem (TIAZAC) 300 MG 24 hr capsule Take 300 mg by mouth daily.  Marland Kitchen EPINEPHrine (EPIPEN IJ) Inject 1 application as directed as needed.  . loratadine (CLARITIN) 10 MG tablet Take 10 mg by mouth daily as needed for  allergies.  . metoprolol succinate (TOPROL-XL) 25 MG 24 hr tablet Take 1 tablet (25 mg total) by mouth daily. Please keep upcoming appt in March 2022 with Dr. Radford Pax before anymore refills. Thank you  . warfarin (COUMADIN) 5 MG tablet TAKE AS DIRECTED BY COUMADIN CLINIC     Allergies:   Ace inhibitors, Rythmol [propafenone], and Nitrofurantoin   Social History   Socioeconomic History  . Marital status: Married    Spouse name: Not on file  . Number of children: Not on file  . Years of education: Not on file  . Highest education level: Not on file  Occupational History  . Not on file  Tobacco Use  . Smoking status: Current Every Day Smoker    Packs/day: 0.50    Years: 50.00    Pack years: 25.00    Types: Cigarettes  . Smokeless tobacco: Never Used  Substance and Sexual Activity  . Alcohol use: Yes    Comment: rare, heavier in the past  . Drug use: No  . Sexual activity: Never  Other Topics Concern  . Not on file  Social History Narrative   Tobacco Use cigarettes: Current Smoker   Smoking: Yes, less than 1/2 pk per month   Alcohol: Yes - No Alcohol since June 2005   Caffeine: yes  No recreational drug use   Occupation: Employed   Marital Status: Married 66 years, wife has MS   Children: none   Social Determinants of Radio broadcast assistant Strain: Not on file  Food Insecurity: Not on file  Transportation Needs: Not on file  Physical Activity: Not on file  Stress: Not on file  Social Connections: Not on file     Family History: The patient's family history includes Cancer in his father; Heart disease in his father; Hyperlipidemia in his father; Hypertension in his mother.  ROS:   Please see the history of present illness.    Review of Systems  Skin: Negative for unusual hair distribution.    All other systems reviewed and negative.   EKGs/Labs/Other Studies Reviewed:    The following studies were reviewed today: EKG  EKG:  EKG is  ordered today and  showed atrial fibrillation with CVR at 86bpm  Recent Labs: No results found for requested labs within last 8760 hours.   Recent Lipid Panel No results found for: CHOL, TRIG, HDL, CHOLHDL, VLDL, LDLCALC, LDLDIRECT  Physical Exam:    VS:  BP 120/70 (BP Location: Left Arm, Patient Position: Sitting, Cuff Size: Normal)   Pulse 86   Ht 6\' 2"  (1.88 m)   Wt 174 lb (78.9 kg)   SpO2 98%   BMI 22.34 kg/m     Wt Readings from Last 3 Encounters:  08/19/20 174 lb (78.9 kg)  08/08/19 176 lb (79.8 kg)  08/01/18 173 lb 1.9 oz (78.5 kg)     GEN: Well nourished, well developed in no acute distress HEENT: Normal NECK: No JVD; No carotid bruits LYMPHATICS: No lymphadenopathy CARDIAC:irregularly irregular, no murmurs, rubs, gallops RESPIRATORY:  Clear to auscultation without rales, wheezing or rhonchi  ABDOMEN: Soft, non-tender, non-distended MUSCULOSKELETAL:  No edema; No deformity  SKIN: Warm and dry NEUROLOGIC:  Alert and oriented x 3 PSYCHIATRIC:  Normal affect    ASSESSMENT:    1. Permanent atrial fibrillation ([place])   2. Essential hypertension, benign    PLAN:    In order of problems listed above:  1.  Permanent atrial fibrillation -his heart rate is well controlled on exam today and he denies any palpitations -continue Diltiazem ER and warfarin -he has not had any bleeding problems  2.  HTN -Bp is adequately controlled on exam today -continue Toprol XL 25mg  daily and Cardizem ER 300mg  daily   Medication Adjustments/Labs and Tests Ordered: Current medicines are reviewed at length with the patient today.  Concerns regarding medicines are outlined above.  Orders Placed This Encounter  Procedures  . EKG 12-Lead   No orders of the defined types were placed in this encounter.   Signed, Fransico Him, MD  08/19/2020 11:32 AM    [place]

## 2020-08-19 NOTE — Patient Instructions (Signed)

## 2020-08-31 DIAGNOSIS — L308 Other specified dermatitis: Secondary | ICD-10-CM | POA: Diagnosis not present

## 2020-08-31 DIAGNOSIS — Z85828 Personal history of other malignant neoplasm of skin: Secondary | ICD-10-CM | POA: Diagnosis not present

## 2020-08-31 DIAGNOSIS — L812 Freckles: Secondary | ICD-10-CM | POA: Diagnosis not present

## 2020-08-31 DIAGNOSIS — L821 Other seborrheic keratosis: Secondary | ICD-10-CM | POA: Diagnosis not present

## 2020-09-01 ENCOUNTER — Ambulatory Visit (INDEPENDENT_AMBULATORY_CARE_PROVIDER_SITE_OTHER): Payer: Medicare Other | Admitting: *Deleted

## 2020-09-01 ENCOUNTER — Other Ambulatory Visit: Payer: Self-pay

## 2020-09-01 DIAGNOSIS — I4821 Permanent atrial fibrillation: Secondary | ICD-10-CM

## 2020-09-01 DIAGNOSIS — Z5181 Encounter for therapeutic drug level monitoring: Secondary | ICD-10-CM | POA: Diagnosis not present

## 2020-09-01 LAB — POCT INR: INR: 3 (ref 2.0–3.0)

## 2020-09-01 NOTE — Patient Instructions (Signed)
Description   Continue taking 1 tablet daily. Recheck INR 8 weeks. [facility]madin Clinic [phone]

## 2020-09-02 ENCOUNTER — Observation Stay: Payer: Medicare Other

## 2020-09-02 ENCOUNTER — Other Ambulatory Visit: Payer: Self-pay

## 2020-09-02 ENCOUNTER — Encounter: Payer: Self-pay | Admitting: Internal Medicine

## 2020-09-02 ENCOUNTER — Emergency Department: Payer: Medicare Other

## 2020-09-02 ENCOUNTER — Observation Stay
Admission: EM | Admit: 2020-09-02 | Discharge: 2020-09-03 | Disposition: A | Discharge status: Home / Self Care | Payer: Medicare Other | Attending: Internal Medicine | Admitting: Internal Medicine

## 2020-09-02 DIAGNOSIS — Z79899 Other long term (current) drug therapy: Secondary | ICD-10-CM | POA: Diagnosis not present

## 2020-09-02 DIAGNOSIS — Z85828 Personal history of other malignant neoplasm of skin: Secondary | ICD-10-CM | POA: Diagnosis not present

## 2020-09-02 DIAGNOSIS — I10 Essential (primary) hypertension: Secondary | ICD-10-CM | POA: Diagnosis not present

## 2020-09-02 DIAGNOSIS — R2681 Unsteadiness on feet: Secondary | ICD-10-CM | POA: Diagnosis not present

## 2020-09-02 DIAGNOSIS — G9389 Other specified disorders of brain: Secondary | ICD-10-CM | POA: Diagnosis not present

## 2020-09-02 DIAGNOSIS — R29818 Other symptoms and signs involving the nervous system: Secondary | ICD-10-CM | POA: Diagnosis not present

## 2020-09-02 DIAGNOSIS — Z8546 Personal history of malignant neoplasm of prostate: Secondary | ICD-10-CM | POA: Diagnosis not present

## 2020-09-02 DIAGNOSIS — I4821 Permanent atrial fibrillation: Secondary | ICD-10-CM | POA: Diagnosis not present

## 2020-09-02 DIAGNOSIS — I4891 Unspecified atrial fibrillation: Secondary | ICD-10-CM | POA: Insufficient documentation

## 2020-09-02 DIAGNOSIS — Z720 Tobacco use: Secondary | ICD-10-CM | POA: Diagnosis not present

## 2020-09-02 DIAGNOSIS — K119 Disease of salivary gland, unspecified: Secondary | ICD-10-CM | POA: Diagnosis not present

## 2020-09-02 DIAGNOSIS — I639 Cerebral infarction, unspecified: Secondary | ICD-10-CM

## 2020-09-02 DIAGNOSIS — F17210 Nicotine dependence, cigarettes, uncomplicated: Secondary | ICD-10-CM | POA: Insufficient documentation

## 2020-09-02 DIAGNOSIS — I6782 Cerebral ischemia: Secondary | ICD-10-CM | POA: Diagnosis not present

## 2020-09-02 DIAGNOSIS — Z7901 Long term (current) use of anticoagulants: Secondary | ICD-10-CM | POA: Insufficient documentation

## 2020-09-02 DIAGNOSIS — R42 Dizziness and giddiness: Secondary | ICD-10-CM | POA: Diagnosis not present

## 2020-09-02 DIAGNOSIS — Z20822 Contact with and (suspected) exposure to covid-19: Secondary | ICD-10-CM | POA: Diagnosis not present

## 2020-09-02 DIAGNOSIS — K118 Other diseases of salivary glands: Secondary | ICD-10-CM | POA: Diagnosis not present

## 2020-09-02 DIAGNOSIS — E7800 Pure hypercholesterolemia, unspecified: Secondary | ICD-10-CM | POA: Diagnosis present

## 2020-09-02 LAB — DIFFERENTIAL
Abs Immature Granulocytes: 0.05 10*3/uL (ref 0.00–0.07)
Basophils Absolute: 0 10*3/uL (ref 0.0–0.1)
Basophils Relative: 0 %
Eosinophils Absolute: 0.1 10*3/uL (ref 0.0–0.5)
Eosinophils Relative: 1 %
Immature Granulocytes: 1 %
Lymphocytes Relative: 13 %
Lymphs Abs: 1.1 10*3/uL (ref 0.7–4.0)
Monocytes Absolute: 0.7 10*3/uL (ref 0.1–1.0)
Monocytes Relative: 9 %
Neutro Abs: 6.2 10*3/uL (ref 1.7–7.7)
Neutrophils Relative %: 76 %

## 2020-09-02 LAB — CBC
HCT: 47.1 % (ref 39.0–52.0)
Hemoglobin: 15.7 g/dL (ref 13.0–17.0)
MCH: 31.1 pg (ref 26.0–34.0)
MCHC: 33.3 g/dL (ref 30.0–36.0)
MCV: 93.3 fL (ref 80.0–100.0)
Platelets: 187 10*3/uL (ref 150–400)
RBC: 5.05 MIL/uL (ref 4.22–5.81)
RDW: 12.8 % (ref 11.5–15.5)
WBC: 8.1 10*3/uL (ref 4.0–10.5)
nRBC: 0 % (ref 0.0–0.2)

## 2020-09-02 LAB — COMPREHENSIVE METABOLIC PANEL
ALT: 20 U/L (ref 0–44)
AST: 26 U/L (ref 15–41)
Albumin: 3.7 g/dL (ref 3.5–5.0)
Alkaline Phosphatase: 58 U/L (ref 38–126)
Anion gap: 9 (ref 5–15)
BUN: 16 mg/dL (ref 8–23)
CO2: 23 mmol/L (ref 22–32)
Calcium: 8.6 mg/dL — ABNORMAL LOW (ref 8.9–10.3)
Chloride: 102 mmol/L (ref 98–111)
Creatinine, Ser: 0.89 mg/dL (ref 0.61–1.24)
GFR, Estimated: >60 mL/min (ref >60)
Glucose, Bld: 188 mg/dL — ABNORMAL HIGH (ref 70–99)
Potassium: 4.6 mmol/L (ref 3.5–5.1)
Sodium: 134 mmol/L — ABNORMAL LOW (ref 135–145)
Total Bilirubin: 0.8 mg/dL (ref 0.3–1.2)
Total Protein: 7.2 g/dL (ref 6.5–8.1)

## 2020-09-02 LAB — APTT: aPTT: 34 seconds (ref 24–36)

## 2020-09-02 LAB — CBG MONITORING, ED: Glucose-Capillary: 130 mg/dL — ABNORMAL HIGH (ref 70–99)

## 2020-09-02 LAB — TROPONIN I (HIGH SENSITIVITY)
Troponin I (High Sensitivity): 6 ng/L (ref <18)
Troponin I (High Sensitivity): 4 ng/L (ref <18)

## 2020-09-02 LAB — PROTIME-INR
INR: 2.3 — ABNORMAL HIGH (ref 0.8–1.2)
Prothrombin Time: 24.5 seconds — ABNORMAL HIGH (ref 11.4–15.2)

## 2020-09-02 MED ORDER — STROKE: EARLY STAGES OF RECOVERY BOOK: Freq: Once | Status: DC

## 2020-09-02 MED ORDER — ACETAMINOPHEN 325 MG PO TABS: 650.0000 mg | ORAL_TABLET | ORAL | Status: DC | PRN

## 2020-09-02 MED ORDER — ACETAMINOPHEN 160 MG/5ML PO SOLN
650.0000 mg | ORAL | Status: DC | PRN
  Filled 2020-09-02: qty 20.3

## 2020-09-02 MED ORDER — NICOTINE 21 MG/24HR TD PT24
21.0000 mg | MEDICATED_PATCH | Freq: Every day | TRANSDERMAL | Status: DC
  Filled 2020-09-02: qty 1

## 2020-09-02 MED ORDER — ATORVASTATIN CALCIUM 20 MG PO TABS
40.0000 mg | ORAL_TABLET | Freq: Every day | ORAL | Status: DC
  Filled 2020-09-02: qty 2

## 2020-09-02 MED ORDER — SENNOSIDES-DOCUSATE SODIUM 8.6-50 MG PO TABS: 1.0000 | ORAL_TABLET | Freq: Every evening | ORAL | Status: DC | PRN

## 2020-09-02 MED ORDER — SODIUM CHLORIDE 0.9% FLUSH
3.0000 mL | Freq: Once | INTRAVENOUS | Status: AC
  Administered 2020-09-02: 3 mL via INTRAVENOUS

## 2020-09-02 MED ORDER — ASPIRIN 81 MG PO CHEW
324.0000 mg | CHEWABLE_TABLET | Freq: Once | ORAL | Status: AC
  Administered 2020-09-02: 324 mg via ORAL
  Filled 2020-09-02: qty 4

## 2020-09-02 MED ORDER — MECLIZINE HCL 25 MG PO TABS
25.0000 mg | ORAL_TABLET | Freq: Once | ORAL | Status: AC
  Administered 2020-09-02: 25 mg via ORAL
  Filled 2020-09-02: qty 1

## 2020-09-02 MED ORDER — LORATADINE 10 MG PO TABS: 10.0000 mg | ORAL_TABLET | Freq: Every day | ORAL | Status: DC | PRN

## 2020-09-02 MED ORDER — GADOBUTROL 1 MMOL/ML IV SOLN
8.0000 mL | Freq: Once | INTRAVENOUS | Status: AC | PRN
  Administered 2020-09-02: 8 mL via INTRAVENOUS

## 2020-09-02 MED ORDER — METOPROLOL SUCCINATE ER 25 MG PO TB24
25.0000 mg | ORAL_TABLET | Freq: Every day | ORAL | Status: DC
  Administered 2020-09-03: 25 mg via ORAL
  Filled 2020-09-02: qty 1

## 2020-09-02 MED ORDER — ACETAMINOPHEN 650 MG RE SUPP: 650.0000 mg | RECTAL | Status: DC | PRN

## 2020-09-02 MED ORDER — WARFARIN - PHARMACIST DOSING INPATIENT
Freq: Every day | Status: DC
  Filled 2020-09-02: qty 1

## 2020-09-02 MED ORDER — DILTIAZEM HCL ER COATED BEADS 300 MG PO CP24
300.0000 mg | ORAL_CAPSULE | Freq: Every day | ORAL | Status: DC
  Administered 2020-09-03: 300 mg via ORAL
  Filled 2020-09-02: qty 1

## 2020-09-02 MED ORDER — ONDANSETRON HCL 4 MG/2ML IJ SOLN: 4.0000 mg | Freq: Three times a day (TID) | INTRAMUSCULAR | Status: DC | PRN

## 2020-09-02 MED ORDER — HYDRALAZINE HCL 20 MG/ML IJ SOLN: 5.0000 mg | INTRAMUSCULAR | Status: DC | PRN

## 2020-09-02 NOTE — Progress Notes (Signed)
   09/02/20 1417  Clinical Encounter Type  Visited With Patient  Visit Type Initial;Spiritual support  Referral From Nurse  Consult/Referral To Chaplain  Spiritual Encounters  Spiritual Needs Prayer;Emotional  Chaplain Autumn Patty responded to a Code Stroke in ED-15, Pt Kyle Kelley. Pt cames into the ED via POV.  Pt states he had just driven from [place] and stopped to get lunch.  When he got out of the car his legs felt uncoordinated and he felt like his balance was off.  Pt was sent to CT for further testing and is now back in his room. I provided a ministry of presence and prayer.

## 2020-09-02 NOTE — ED Notes (Signed)
Called [facility] initiated code stroke [phone]

## 2020-09-02 NOTE — ED Triage Notes (Signed)
Pt comes into the ED via POV c/o dizziness and loss of balance.  Pt states he had just driven from [place] and stopped to get lunch.  When he got out of the car his legs felt uncoordinated and he felt like his balance was off.  Pt states that he denies any unilateral weakness, symmetrical smile, and A&Ox4.  Pt currently has even and unlabored respirations.

## 2020-09-02 NOTE — Consult Note (Signed)
Referring Physician: Dr. Charna Archer    Chief Complaint: Acute onset of feeling off balance with leg incoordination  HPI: Kyle Kelley is an 75 y.o. male with a PMHx of atrial fibrillation (on warfarin), dyslipidemia, HTN, prostate CA (s/p prostatectomy), skin cancer and light smoking who presented to the ED via POV with a c/c of loss of balance with a sensation of dizziness while walking. He was last normal at 1200 when he was driving his car. He stopped for lunch and upon getting up out of his car, he suddenly felt dizzy and also had a feeling of gait unsteadiness, as though his balance was off and his legs were incoordinated. He states the dizziness was not vertiginous or presyncopal, feeling more as though he was in a rocking boat. He went into the restaurant and sat down, at which point the symptoms went away. After eating, he got up and the symptoms returned. He then decided to drive to the ED to be evaluated.    He denies having any weakness with the symptoms. Also no numbness, limb ataxia, no sensation of "walking like drunk", no facial droop, vision loss, dysphagia, dysarthria, confusion or aphasia. He denies CP, SOB, headache, loss of B/B continence, seizure or tremor.   His INR was checked yesterday, with a value of 3.   CT head: 1. No acute intracranial abnormality 2. ASPECTS is 10 3. 17 mm right parotid mass.  Recommend surgical consultation.  EKG: Atrial fibrillation Anterior infarct, old  LSN: 1200 tPA Given: No: NIHSS of 0.   Past Medical History:  Diagnosis Date  . Dyslipidemia   . Hypertension   . Inguinal hernia 2010   Small asymptomatic L inguinal Hernia  . Permanent atrial fibrillation (HCC)    atrial fib   . Pneumonia    hx of   . Prostate cancer ([place]) 07/2012   s/p prostatectomy  . PSA elevation    workup per Dr. Rana Snare, March 2014  . Sigmoid diverticulosis    on colonoscopy in 2010  . Skin cancer 02/2012   Dr Holley Dexter  . Tobacco dependence    rarely  smokes-3 cigs per week    Past Surgical History:  Procedure Laterality Date  . CARDIOVERSION  08/05/2005   Direct Current Cardioversion  . COLONOSCOPY  2010  . Direct current Cardioversion  08/05/2005  . PROSTATE BIOPSY  09/06/2012   Dr. Rana Snare  . ROBOT ASSISTED LAPAROSCOPIC RADICAL PROSTATECTOMY N/A 10/31/2012   Procedure: ROBOTIC ASSISTED LAPAROSCOPIC RADICAL PROSTATECTOMY;  Surgeon: Bernestine Amass, MD;  Location: [facility];  Service: Urology;  Laterality: N/A;  . TONSILLECTOMY AND ADENOIDECTOMY    . TONSILLECTOMY AND ADENOIDECTOMY      Family History  Problem Relation Age of Onset  . Hypertension Mother   . Heart disease Father   . Hyperlipidemia Father   . Cancer Father        prostate   Social History:  reports that he has been smoking cigarettes. He has a 25.00 pack-year smoking history. He has never used smokeless tobacco. He reports current alcohol use. He reports that he does not use drugs.  Allergies:  Allergies  Allergen Reactions  . Ace Inhibitors Anaphylaxis  . Rythmol [Propafenone] Shortness Of Breath  . Nitrofurantoin Rash    Home Medications:  No current facility-administered medications on file prior to encounter.   Current Outpatient Medications on File Prior to Encounter  Medication Sig Dispense Refill  . diltiazem (TIAZAC) 300 MG 24 hr capsule  Take 300 mg by mouth daily.    Marland Kitchen EPINEPHrine (EPIPEN IJ) Inject 1 application as directed as needed.    . loratadine (CLARITIN) 10 MG tablet Take 10 mg by mouth daily as needed for allergies.    . metoprolol succinate (TOPROL-XL) 25 MG 24 hr tablet Take 1 tablet (25 mg total) by mouth daily. Please keep upcoming appt in March 2022 with Dr. Radford Pax before anymore refills. Thank you 90 tablet 0  . warfarin (COUMADIN) 5 MG tablet TAKE AS DIRECTED BY COUMADIN CLINIC 100 tablet 1     ROS: As per HPI. Does not endorse any additional symptoms.   Physical Examination: There were no vitals taken for this  visit.  HEENT: [place]/AT Lungs: Respirations unlabored Ext: No edema  Neurologic Examination: Mental Status: Alert, fully oriented, thought content appropriate.  Speech fluent without evidence of aphasia.  Able to follow all commands without difficulty. Cranial Nerves: II:  Temporal visual intact with no extinction to DSS. Pupils equal. Fixates and tracks normally.  III,IV, VI: No ptosis. EOMI. No nystagmus. .  V,VII: Smile symmetric, facial temp sensation equal bilaterally VIII: hearing intact to conversation IX,X: No hypophonia or hoarseness XI: Symmetric XII: Midline tongue extension  Motor: Right : Upper extremity   5/5    Left:     Upper extremity   5/5  Lower extremity   5/5     Lower extremity   5/5 No pronator drift.  Sensory: Temp and light touch intact to all 4 extremities without asymmetry. No extinction to DSS.  Deep Tendon Reflexes:  2+ bilateral biceps, brachioradialis, patellae and achilles.  Toes downgoing bilaterally  Cerebellar: No ataxia with FNF and H-S bilaterally  Gait: Normal gait and station. Negative Romberg, but did need to step backwards to steady himself during testing of response to retropulsion.   Results for orders placed or performed during the hospital encounter of 09/02/20 (from the past 48 hour(s))  CBG monitoring, ED     Status: Abnormal   Collection Time: 09/02/20  1:59 PM  Result Value Ref Range   Glucose-Capillary 130 (H) 70 - 99 mg/dL    Comment: Glucose reference range applies only to samples taken after fasting for at least 8 hours.   No results found.  Assessment: 75 y.o. male with a history of atrial fibrillation, on warfarin, presenting with acute onset of subjective dizziness and gait unsteadiness, now resolved 1. Neurological exam is nonfocal.  2. CT head negative for acute intracranial abnormality.  3. EKG shows atrial fibrillation 4. Stroke Risk Factors - atrial fibrillation, dyslipidemia, HTN, history of cancer, smoking 5.  Orthostatics (lying to standing) are normal 6. Posterior fossa stroke or TIA is unlikely based on the symptoms described by the patient (no weakness or sensory loss, and the described dizziness was non-vertiginous), but MRI should be obtained to fully evaluate given his history of atrial fibrillation. Overall presentation is more suggestive of an acute peripheral vestibulopathy.  7. INR yesterday was therapeutic  Recommendations: 1. MRI brain with and without contrast. Will also need to include MRI face to further assess the right parotid mass seen on CT.  2. MRA of the head without contrast.  3. Carotid ultrasound.  4. TTE 5. Cardiac telemetry 6. PT consult, OT consult, Speech consult 7. Continue warfarin. Pharmacy to dose.  8. Frequent neuro checks  @Electronically  signed: Dr. Kerney Elbe  09/02/2020, 2:12 PM

## 2020-09-02 NOTE — ED Provider Notes (Signed)
[facility] Emergency Department Provider Note   ____________________________________________   Event Date/Time   First MD Initiated Contact with Patient 09/02/20 1411     (approximate)  I have reviewed the triage vital signs and the nursing notes.   HISTORY  Chief Complaint Dizziness and Code Stroke    HPI Kyle Kelley is a 75 y.o. male with past medical history of hypertension, hyperlipidemia, and permanent atrial fibrillation on Coumadin who presents to the ED complaining of dizziness.  Patient reports that he was just parking his car and when he went to get out felt very dizzy and lightheaded upon standing.  He states that he felt like his coordination was off and like he was rocking on a ship at [place].  He denies any associated chest pain or shortness of breath, did not have any vision changes, speech changes, numbness, or weakness.  He has never had similar symptoms in the past.  He now states he feels better but does still have some mild symptoms upon standing.        Past Medical History:  Diagnosis Date  . Dyslipidemia   . Hypertension   . Inguinal hernia 2010   Small asymptomatic L inguinal Hernia  . Permanent atrial fibrillation (HCC)    atrial fib   . Pneumonia    hx of   . Prostate cancer ([place]) 07/2012   s/p prostatectomy  . PSA elevation    workup per Dr. Rana Snare, March 2014  . Sigmoid diverticulosis    on colonoscopy in 2010  . Skin cancer 02/2012   Dr Holley Dexter  . Tobacco dependence    rarely smokes-3 cigs per week    Patient Active Problem List   Diagnosis Date Noted  . Malignant neoplasm of prostate ([place]) 09/05/2016  . Encounter for therapeutic drug monitoring 07/09/2013  . Essential hypertension, benign 05/08/2013  . Pure hypercholesterolemia 05/08/2013  . Permanent atrial fibrillation ([place]) 03/05/2013    Past Surgical History:  Procedure Laterality Date  . CARDIOVERSION  08/05/2005   Direct Current Cardioversion   . COLONOSCOPY  2010  . Direct current Cardioversion  08/05/2005  . PROSTATE BIOPSY  09/06/2012   Dr. Rana Snare  . ROBOT ASSISTED LAPAROSCOPIC RADICAL PROSTATECTOMY N/A 10/31/2012   Procedure: ROBOTIC ASSISTED LAPAROSCOPIC RADICAL PROSTATECTOMY;  Surgeon: Bernestine Amass, MD;  Location: [facility];  Service: Urology;  Laterality: N/A;  . TONSILLECTOMY AND ADENOIDECTOMY    . TONSILLECTOMY AND ADENOIDECTOMY      Prior to Admission medications   Medication Sig Start Date End Date Taking? Authorizing Provider  diltiazem (TIAZAC) 300 MG 24 hr capsule Take 300 mg by mouth daily.    [provider]  EPINEPHrine (EPIPEN IJ) Inject 1 application as directed as needed.    [provider]  loratadine (CLARITIN) 10 MG tablet Take 10 mg by mouth daily as needed for allergies.    [provider]  metoprolol succinate (TOPROL-XL) 25 MG 24 hr tablet Take 1 tablet (25 mg total) by mouth daily. Please keep upcoming appt in March 2022 with Dr. Radford Pax before anymore refills. Thank you 07/16/20   Sueanne Margarita, MD  warfarin (COUMADIN) 5 MG tablet TAKE AS DIRECTED BY COUMADIN CLINIC 05/18/20   Sueanne Margarita, MD    Allergies Ace inhibitors, Rythmol [propafenone], and Nitrofurantoin  Family History  Problem Relation Age of Onset  . Hypertension Mother   . Heart disease Father   . Hyperlipidemia Father   .  Cancer Father        prostate    Social History Social History   Tobacco Use  . Smoking status: Current Every Day Smoker    Packs/day: 0.50    Years: 50.00    Pack years: 25.00    Types: Cigarettes  . Smokeless tobacco: Never Used  Substance Use Topics  . Alcohol use: Yes    Comment: rare, heavier in the past  . Drug use: No    Review of Systems  Constitutional: No fever/chills Eyes: No visual changes. ENT: No sore throat. Cardiovascular: Denies chest pain. Respiratory: Denies shortness of breath. Gastrointestinal: No abdominal pain.  No nausea, no vomiting.  No  diarrhea.  No constipation. Genitourinary: Negative for dysuria. Musculoskeletal: Negative for back pain. Skin: Negative for rash. Neurological: Negative for headaches, focal weakness or numbness.  Positive for dizziness.  ____________________________________________   PHYSICAL EXAM:  VITAL SIGNS: ED Triage Vitals  Enc Vitals Group     BP      Pulse      Resp      Temp      Temp src      SpO2      Weight      Height      Head Circumference      Peak Flow      Pain Score      Pain Loc      Pain Edu?      Excl. in [place]?     Constitutional: Alert and oriented. Eyes: Conjunctivae are normal.  Pupils equal round and reactive to light bilaterally. Head: Atraumatic. Nose: No congestion/rhinnorhea. Mouth/Throat: Mucous membranes are moist. Neck: Normal ROM Cardiovascular: Normal rate, regular rhythm. Grossly normal heart sounds. Respiratory: Normal respiratory effort.  No retractions. Lungs CTAB. Gastrointestinal: Soft and nontender. No distention. Genitourinary: deferred Musculoskeletal: No lower extremity tenderness nor edema. Neurologic:  Normal speech and language. No gross focal neurologic deficits are appreciated.  Finger-to-nose testing without dysmetria bilaterally. Skin:  Skin is warm, dry and intact. No rash noted. Psychiatric: Mood and affect are normal. Speech and behavior are normal.  ____________________________________________   LABS (all labs ordered are listed, but only abnormal results are displayed)  Labs Reviewed  PROTIME-INR - Abnormal; Notable for the following components:      Result Value   Prothrombin Time 24.5 (*)    INR 2.3 (*)    All other components within normal limits  COMPREHENSIVE METABOLIC PANEL - Abnormal; Notable for the following components:   Sodium 134 (*)    Glucose, Bld 188 (*)    Calcium 8.6 (*)    All other components within normal limits  CBG MONITORING, ED - Abnormal; Notable for the following components:    Glucose-Capillary 130 (*)    All other components within normal limits  SARS CORONAVIRUS 2 (TAT 6-24 HRS)  APTT  CBC  DIFFERENTIAL  CBG MONITORING, ED  TROPONIN I (HIGH SENSITIVITY)  TROPONIN I (HIGH SENSITIVITY)   ____________________________________________  EKG  ED ECG REPORT I, Blake Divine, the attending physician, personally viewed and interpreted this ECG.   Date: 09/02/2020  EKG Time: 14:29  Rate: 73  Rhythm: atrial fibrillation  Axis: Normal  Intervals:none  ST&T Change: None   PROCEDURES  Procedure(s) performed (including Critical Care):  Procedures   ____________________________________________   INITIAL IMPRESSION / ASSESSMENT AND PLAN / ED COURSE       75 year old male with past medical history of hypertension, hyperlipidemia, and permanent atrial fibrillation on Coumadin  who presents to the ED complaining of episode of dizziness and feeling unstable on his feet.  Patient now states that symptoms are much improved and he is asymptomatic at rest, does have mild symptoms upon standing up.  He has no focal neurologic deficits on exam.  Patient evaluated by Dr. Cheral Marker of neurology as part of code stroke, low suspicion for acute stroke at this time.  CT head reviewed by me and shows no obvious hemorrhage, negative for acute process per radiology.  Symptoms sound most consistent with a peripheral vertigo and we will treat with meclizine and reassess.  Patient without significant change in symptoms following dose of meclizine.  Per neurology recommendations, we will admit for further stroke rule out.  Remainder of labs are unremarkable and case discussed with hospitalist for admission.      ____________________________________________   FINAL CLINICAL IMPRESSION(S) / ED DIAGNOSES  Final diagnoses:  Dizziness  Atrial fibrillation, unspecified type [facility])     ED Discharge Orders    None       Note:  This document was prepared using Dragon voice  recognition software and may include unintentional dictation errors.   Blake Divine, MD 09/02/20 [phone]

## 2020-09-02 NOTE — H&P (Signed)
History and Physical    Kyle Kelley DGL:[id number] DOB: [birth year] DOA: 09/02/2020  Referring MD/NP/PA:   PCP: Josetta Huddle, MD   Patient coming from:  The patient is coming from home.  At baseline, pt is independent for most of ADL.        Chief Complaint: Dizziness and poor balance  HPI: Kyle Kelley is a 75 y.o. male with medical history significant of hypertension, hyperlipidemia, tobacco abuse, prostate cancer (s/p of prostatectomy), atrial fibrillation on Coumadin, who presents with dizziness and poor balance.  Pt states that he felt normal at 1200 when was driving his car. He stopped for lunch and upon getting up out of his car, he suddenly felt dizzy while walking at about 1:00 PM.  He has an unsteady gait. His legs were incoordinated.  He states that he went into the restaurant and sat down, and his symptoms went away. After eating, he got up and the symptoms returned.  Patient does not have unilateral numbness or tingling in his extremities.  No facial droop or slurred speech.  No difficulty swallowing.  Patient does not have chest pain, cough, shortness breath.  No fever or chills.  No nausea vomiting, diarrhea or abdominal pain.  No symptoms of UTI.  ED Course: pt was found to have WBC 8.1, INR 2.3, PTT 34, troponin level 6, pending COVID-19 PCR, electrolytes renal function okay, temperature normal, blood pressure 136/86, heart rate 67, RR 19, oxygen saturation 96% on room air.  CT head is negative for acute intracranial abnormalities, but showed a 17 mm right parotid mass.  Patient is placed on MedSurg bed for position, Dr. Cheral Marker of neurology is consulted.  Review of Systems:   General: no fevers, chills, no body weight gain, has fatigue HEENT: no blurry vision, hearing changes or sore throat Respiratory: no dyspnea, coughing, wheezing CV: no chest pain, no palpitations GI: no nausea, vomiting, abdominal pain, diarrhea, constipation GU: no dysuria, burning on urination,  increased urinary frequency, hematuria  Ext: no leg edema Neuro: no unilateral weakness, numbness, or tingling, no vision change or hearing loss. Has dizziness and poor balance Skin: no rash, no skin tear. MSK: No muscle spasm, no deformity, no limitation of range of movement in spin Heme: No easy bruising.  Travel history: No recent long distant travel.  Allergy:  Allergies  Allergen Reactions  . Ace Inhibitors Anaphylaxis  . Rythmol [Propafenone] Shortness Of Breath  . Nitrofurantoin Rash    Past Medical History:  Diagnosis Date  . Dyslipidemia   . Hypertension   . Inguinal hernia 2010   Small asymptomatic L inguinal Hernia  . Permanent atrial fibrillation (HCC)    atrial fib   . Pneumonia    hx of   . Prostate cancer ([place]) 07/2012   s/p prostatectomy  . PSA elevation    workup per Dr. Rana Snare, March 2014  . Sigmoid diverticulosis    on colonoscopy in 2010  . Skin cancer 02/2012   Dr Holley Dexter  . Tobacco dependence    rarely smokes-3 cigs per week    Past Surgical History:  Procedure Laterality Date  . CARDIOVERSION  08/05/2005   Direct Current Cardioversion  . COLONOSCOPY  2010  . Direct current Cardioversion  08/05/2005  . PROSTATE BIOPSY  09/06/2012   Dr. Rana Snare  . ROBOT ASSISTED LAPAROSCOPIC RADICAL PROSTATECTOMY N/A 10/31/2012   Procedure: ROBOTIC ASSISTED LAPAROSCOPIC RADICAL PROSTATECTOMY;  Surgeon: Bernestine Amass, MD;  Location: [facility];  Service: Urology;  Laterality: N/A;  . TONSILLECTOMY AND ADENOIDECTOMY    . TONSILLECTOMY AND ADENOIDECTOMY      Social History:  reports that he has been smoking cigarettes. He has a 25.00 pack-year smoking history. He has never used smokeless tobacco. He reports current alcohol use. He reports that he does not use drugs.  Family History:  Family History  Problem Relation Age of Onset  . Hypertension Mother   . Heart disease Father   . Hyperlipidemia Father   . Cancer Father        prostate     Prior to  Admission medications   Medication Sig Start Date End Date Taking? Authorizing Provider  diltiazem (TIAZAC) 300 MG 24 hr capsule Take 300 mg by mouth daily.    [provider]  EPINEPHrine (EPIPEN IJ) Inject 1 application as directed as needed.    [provider]  loratadine (CLARITIN) 10 MG tablet Take 10 mg by mouth daily as needed for allergies.    [provider]  metoprolol succinate (TOPROL-XL) 25 MG 24 hr tablet Take 1 tablet (25 mg total) by mouth daily. Please keep upcoming appt in March 2022 with Dr. Radford Pax before anymore refills. Thank you 07/16/20   Sueanne Margarita, MD  warfarin (COUMADIN) 5 MG tablet TAKE AS DIRECTED BY COUMADIN CLINIC 05/18/20   Sueanne Margarita, MD    Physical Exam: Vitals:   09/02/20 1530 09/02/20 1600 09/02/20 1630 09/02/20 1707  BP: 118/68 112/71 136/86 123/77  Pulse: 67 64 67 77  Resp: 14 13 19 20   Temp:      TempSrc:      SpO2: 98% 100% 96% 99%  Weight:       General: Not in acute distress HEENT:       Eyes: PERRL, EOMI, no scleral icterus.       ENT: No discharge from the ears and nose, no pharynx injection, no tonsillar enlargement.        Neck: No JVD, no bruit, no mass felt. Heme: No neck lymph node enlargement. Cardiac: S1/S2, RRR, No murmurs, No gallops or rubs. Respiratory: No rales, wheezing, rhonchi or rubs. GI: Soft, nondistended, nontender, no rebound pain, no organomegaly, BS present. GU: No hematuria Ext: No pitting leg edema bilaterally. 1+DP/PT pulse bilaterally. Musculoskeletal: No joint deformities, No joint redness or warmth, no limitation of ROM in spin. Skin: No rashes.  Neuro: Alert, oriented X3, cranial nerves II-XII grossly intact, moves all extremities normally. Muscle strength 5/5 in all extremities, sensation to light touch intact. Brachial reflex 2+ bilaterally. Psych: Patient is not psychotic, no suicidal or hemocidal ideation.  Labs on Admission: I have personally reviewed following labs and  imaging studies  CBC: Recent Labs  Lab 09/02/20 1436  WBC 8.1  NEUTROABS 6.2  HGB 15.7  HCT 47.1  MCV 93.3  PLT 606   Basic Metabolic Panel: Recent Labs  Lab 09/02/20 1436  NA 134*  K 4.6  CL 102  CO2 23  GLUCOSE 188*  BUN 16  CREATININE 0.89  CALCIUM 8.6*   GFR: Estimated Creatinine Clearance: 83.9 mL/min (by C-G formula based on SCr of 0.89 mg/dL). Liver Function Tests: Recent Labs  Lab 09/02/20 1436  AST 26  ALT 20  ALKPHOS 58  BILITOT 0.8  PROT 7.2  ALBUMIN 3.7   No results for input(s): LIPASE, AMYLASE in the last 168 hours. No results for input(s): AMMONIA in the last 168 hours. Coagulation Profile: Recent Labs  Lab 09/01/20  1104 09/02/20 1436  INR 3.0 2.3*   Cardiac Enzymes: No results for input(s): CKTOTAL, CKMB, CKMBINDEX, TROPONINI in the last 168 hours. BNP (last 3 results) No results for input(s): PROBNP in the last 8760 hours. HbA1C: No results for input(s): HGBA1C in the last 72 hours. CBG: Recent Labs  Lab 09/02/20 1359  GLUCAP 130*   Lipid Profile: No results for input(s): CHOL, HDL, LDLCALC, TRIG, CHOLHDL, LDLDIRECT in the last 72 hours. Thyroid Function Tests: No results for input(s): TSH, T4TOTAL, FREET4, T3FREE, THYROIDAB in the last 72 hours. Anemia Panel: No results for input(s): VITAMINB12, FOLATE, FERRITIN, TIBC, IRON, RETICCTPCT in the last 72 hours. Urine analysis: No results found for: COLORURINE, APPEARANCEUR, LABSPEC, PHURINE, GLUCOSEU, HGBUR, BILIRUBINUR, KETONESUR, PROTEINUR, UROBILINOGEN, NITRITE, LEUKOCYTESUR Sepsis Labs: @LABRCNTIP (procalcitonin:4,lacticidven:4) )No results found for this or any previous visit (from the past 240 hour(s)).   Radiological Exams on Admission: CT HEAD CODE STROKE WO CONTRAST  Result Date: 09/02/2020 CLINICAL DATA:  Code stroke.  Acute neuro deficit.  Dizziness. EXAM: CT HEAD WITHOUT CONTRAST TECHNIQUE: Contiguous axial images were obtained from the base of the skull through the  vertex without intravenous contrast. COMPARISON:  None. FINDINGS: Brain: Ventricle size and cerebral volume normal for age. Negative for acute infarct, hemorrhage, mass. Vascular: Negative for hyperdense vessel Skull: Negative Sinuses/Orbits: Mild mucosal edema paranasal sinuses. Negative orbit Other: Soft tissue nodule right parotid measuring 17 mm in diameter. This is incompletely evaluated. ASPECTS [facility] Stroke Program Early CT Score) - Ganglionic level infarction (caudate, lentiform nuclei, internal capsule, insula, M1-M3 cortex): 7 - Supraganglionic infarction (M4-M6 cortex): 3 Total score (0-10 with 10 being normal): 10 IMPRESSION: 1. No acute intracranial abnormality 2. ASPECTS is 10 3. 17 mm right parotid mass.  Recommend surgical consultation. 4. These results were called by telephone at the time of interpretation on 09/02/2020 at 2:19 pm to provider Duffy Bruce , who verbally acknowledged these results. Electronically Signed   By: Franchot Gallo M.D.   On: 09/02/2020 14:20     EKG: I have personally reviewed.  Atrial fibrillation, QTc 397, low voltage, poor R wave progression   Assessment/Plan Principal Problem:   Dizziness Active Problems:   Permanent atrial fibrillation (HCC)   Essential hypertension, benign   Pure hypercholesterolemia   Tobacco abuse   Parotid mass   Dizziness: Etiology is not clear.  Potential differential diagnosis is posterior circulation stroke.  Dr. Cheral Marker neurology is consulted --> recommend stroke work-up.  - Placed on MedSurg bed for observation - Obtain MRI of brain with and without contrast per Dr. Cheral Marker (also asked to include evaluation of face -right parotid gland) - MRA of head - Check carotid dopplers  - Continue coumadin per pharm - fasting lipid panel and HbA1c  - Start Lipitor 40 mg daily - 2D transthoracic echocardiography  - swallowing screen. If fails, will get SLP - Check UDS  - PT/OT consult  Permanent atrial fibrillation  (HCC) -Cardizem and metoprolol -Continue Coumadin  Essential hypertension, benign -IV hydralazine as needed -Patient is on Cardizem and metoprolol  Pure hypercholesterolemia -Started Lipitor 40 mg daily  Tobacco abuse -Nicotine patch  Parotid mass -Consulted with Dr. Richardson Landry of ENT.  He recommended to follow-up in the office after stroke work-up is completed     DVT ppx: on coumadin Code Status: Full code Family Communication: not done, no family member is at bed side.     Disposition Plan:  Anticipate discharge back to previous environment Consults called:  Dr. Cheral Marker of neurology,  Dr. Richardson Landry of ENT Admission status and Level of care: Med-Surg:  For obs    Status is: Observation  The patient remains OBS appropriate and will d/c before 2 midnights.  Dispo: The patient is from: Home              Anticipated d/c is to: Home              Patient currently is not medically stable to d/c.   Difficult to place patient No          Date of Service 09/02/2020    [facility]   If 7PM-7AM, please contact night-coverage [email or web address] 09/02/2020, 6:08 PM

## 2020-09-02 NOTE — ED Notes (Signed)
Dr. Charna Archer, [facility] and Neurologist at bedside.

## 2020-09-02 NOTE — ED Notes (Signed)
Pt arrived from CT.  

## 2020-09-02 NOTE — Consult Note (Signed)
[place] for Warfarin Indication: atrial fibrillation  Allergies  Allergen Reactions  . Ace Inhibitors Anaphylaxis  . Rythmol [Propafenone] Shortness Of Breath  . Nitrofurantoin Rash    Patient Measurements: Weight: 80.2 kg (176 lb 12.9 oz)  Vital Signs: Temp: 98.1 F (36.7 C) (04/06 1430) Temp Source: Oral (04/06 1430) BP: 123/77 (04/06 1707) Pulse Rate: 77 (04/06 1707)  Labs: Recent Labs    09/01/20 1104 09/02/20 1435 09/02/20 1436  HGB  --   --  15.7  HCT  --   --  47.1  PLT  --   --  187  APTT  --   --  34  LABPROT  --   --  24.5*  INR 3.0  --  2.3*  CREATININE  --   --  0.89  TROPONINIHS  --  6  --     Estimated Creatinine Clearance: 83.9 mL/min (by C-G formula based on SCr of 0.89 mg/dL).   Medical History: Past Medical History:  Diagnosis Date  . Dyslipidemia   . Hypertension   . Inguinal hernia 2010   Small asymptomatic L inguinal Hernia  . Permanent atrial fibrillation (HCC)    atrial fib   . Pneumonia    hx of   . Prostate cancer ([place]) 07/2012   s/p prostatectomy  . PSA elevation    workup per Dr. Rana Snare, March 2014  . Sigmoid diverticulosis    on colonoscopy in 2010  . Skin cancer 02/2012   Dr Holley Dexter  . Tobacco dependence    rarely smokes-3 cigs per week    Medications:  (Not in a hospital admission)  Scheduled:  Infusions:  PRN:  Anti-infectives (From admission, onward)   None      Assessment: Pharmacy consulted to restart warfarin for afib. Home warfarin regimen 5 mg daily. No major DDI. Code stroke was called - no tPA.   Date INR Warfarin Dose  4/6 2.3 Pt took warfarin         Goal of Therapy:  INR 2-3 Monitor platelets by anticoagulation protocol: Yes   Plan:  Will hold giving a dose of warfarin today as pt took warfarin this morning. Daily INR ordered. CBC at least every 3 days.   Oswald Hillock, PharmD, BCPS 09/02/2020,5:54 PM

## 2020-09-03 ENCOUNTER — Encounter: Payer: Self-pay | Admitting: Internal Medicine

## 2020-09-03 ENCOUNTER — Observation Stay: Payer: Medicare Other

## 2020-09-03 ENCOUNTER — Observation Stay
Admit: 2020-09-03 | Discharge: 2020-09-03 | Disposition: A | Discharge status: Home / Self Care | Payer: Medicare Other | Attending: Internal Medicine | Admitting: Internal Medicine

## 2020-09-03 DIAGNOSIS — K118 Other diseases of salivary glands: Secondary | ICD-10-CM | POA: Diagnosis not present

## 2020-09-03 DIAGNOSIS — R55 Syncope and collapse: Secondary | ICD-10-CM | POA: Diagnosis not present

## 2020-09-03 DIAGNOSIS — I639 Cerebral infarction, unspecified: Secondary | ICD-10-CM | POA: Diagnosis not present

## 2020-09-03 DIAGNOSIS — R0602 Shortness of breath: Secondary | ICD-10-CM | POA: Diagnosis not present

## 2020-09-03 DIAGNOSIS — I10 Essential (primary) hypertension: Secondary | ICD-10-CM | POA: Diagnosis not present

## 2020-09-03 DIAGNOSIS — R42 Dizziness and giddiness: Secondary | ICD-10-CM | POA: Diagnosis not present

## 2020-09-03 DIAGNOSIS — I63233 Cerebral infarction due to unspecified occlusion or stenosis of bilateral carotid arteries: Secondary | ICD-10-CM | POA: Diagnosis not present

## 2020-09-03 DIAGNOSIS — I4821 Permanent atrial fibrillation: Secondary | ICD-10-CM | POA: Diagnosis not present

## 2020-09-03 LAB — ECHOCARDIOGRAM COMPLETE
AR max vel: 1.26 cm2
AV Area VTI: 1.12 cm2
AV Area mean vel: 1.13 cm2
AV Mean grad: 9 mmHg
AV Peak grad: 16.3 mmHg
Ao pk vel: 2.02 m/s
Area-P 1/2: 3.18 cm2
Height: 74 in
MV VTI: 2.44 cm2
S' Lateral: 3.2 cm
Weight: 2828.94 oz

## 2020-09-03 LAB — LIPID PANEL
Cholesterol: 195 mg/dL (ref 0–200)
HDL: 49 mg/dL (ref >40)
LDL Cholesterol: 126 mg/dL — ABNORMAL HIGH (ref 0–99)
Total CHOL/HDL Ratio: 4 RATIO
Triglycerides: 99 mg/dL (ref <150)
VLDL: 20 mg/dL (ref 0–40)

## 2020-09-03 LAB — URINE DRUG SCREEN, QUALITATIVE (ARMC ONLY)
Amphetamines, Ur Screen: NOT DETECTED
Barbiturates, Ur Screen: NOT DETECTED
Benzodiazepine, Ur Scrn: NOT DETECTED
Cannabinoid 50 Ng, Ur ~~LOC~~: NOT DETECTED
Cocaine Metabolite,Ur ~~LOC~~: NOT DETECTED
MDMA (Ecstasy)Ur Screen: NOT DETECTED
Methadone Scn, Ur: NOT DETECTED
Opiate, Ur Screen: NOT DETECTED
Phencyclidine (PCP) Ur S: NOT DETECTED
Tricyclic, Ur Screen: NOT DETECTED

## 2020-09-03 LAB — PROTIME-INR
INR: 2.5 — ABNORMAL HIGH (ref 0.8–1.2)
Prothrombin Time: 26.2 seconds — ABNORMAL HIGH (ref 11.4–15.2)

## 2020-09-03 LAB — HEMOGLOBIN A1C
Hgb A1c MFr Bld: 5.9 % — ABNORMAL HIGH (ref 4.8–5.6)
Mean Plasma Glucose: 122.63 mg/dL

## 2020-09-03 LAB — SARS CORONAVIRUS 2 (TAT 6-24 HRS): SARS Coronavirus 2: NEGATIVE

## 2020-09-03 MED ORDER — WARFARIN SODIUM 5 MG PO TABS
5.0000 mg | ORAL_TABLET | Freq: Every day | ORAL | Status: DC
  Filled 2020-09-03: qty 1

## 2020-09-03 NOTE — Evaluation (Signed)
Physical Therapy Evaluation Patient Details Name: Kyle Kelley MRN: [id number] DOB: Jan 04, 1946 Today's Date: 09/03/2020   History of Present Illness  75 y.o. male with medical history significant of hypertension, hyperlipidemia, tobacco abuse, prostate cancer (s/p of prostatectomy), atrial fibrillation on Coumadin, who presents with dizziness and poor balance.  Clinical Impression  Pt reports he has been symptom free since last night and has been able to be up and walking, feeling essentially at his baseline.  He had equal and functional strength t/o, showed no safety concerns and generally did very well.  His description of symptoms was not congruent with BPPV or other inner ear issues, pt functionally at baseline and does not require further PT f/u, safe to return home from PT perspective.   Follow Up Recommendations No PT follow up    Equipment Recommendations  None recommended by PT    Recommendations for Other Services       Precautions / Restrictions Precautions Precautions: None Restrictions Weight Bearing Restrictions: No      Mobility  Bed Mobility Overal bed mobility: Independent                  Transfers Overall transfer level: Independent Equipment used: None             General transfer comment: easily and confidently gets to standing  Ambulation/Gait Ambulation/Gait assistance: Independent Gait Distance (Feet): 250 Feet Assistive device: None       General Gait Details: Consistent and confident cadence, no LOBs, no S/S of dizziness, etc, good speed and safety.  Stairs Stairs: Yes Stairs assistance: Independent Stair Management: No rails Number of Stairs: 6 General stair comments: easily negotiates up/down steps reciprocally w/o rails  Wheelchair Mobility    Modified Rankin (Stroke Patients Only)       Balance Overall balance assessment: Independent                                           Pertinent  Vitals/Pain Pain Assessment: No/denies pain    Home Living Family/patient expects to be discharged to:: Private residence Living Arrangements: Spouse/significant other Available Help at Discharge: Family   Home Access: Stairs to enter Entrance Stairs-Rails: Right Entrance Stairs-Number of Steps: 2          Prior Function Level of Independence: Independent               Hand Dominance        Extremity/Trunk Assessment   Upper Extremity Assessment Upper Extremity Assessment: Overall WFL for tasks assessed    Lower Extremity Assessment Lower Extremity Assessment: Overall WFL for tasks assessed       Communication   Communication: No difficulties  Cognition Arousal/Alertness: Awake/alert Behavior During Therapy: WFL for tasks assessed/performed Overall Cognitive Status: Within Functional Limits for tasks assessed                                        General Comments      Exercises     Assessment/Plan    PT Assessment Patent does not need any further PT services  PT Problem List         PT Treatment Interventions      PT Goals (Current goals can be found in the Care Plan section)  Acute Rehab  PT Goals Patient Stated Goal: go home PT Goal Formulation: All assessment and education complete, DC therapy    Frequency     Barriers to discharge        Co-evaluation               AM-PAC PT "6 Clicks" Mobility  Outcome Measure Help needed turning from your back to your side while in a flat bed without using bedrails?: None Help needed moving from lying on your back to sitting on the side of a flat bed without using bedrails?: None Help needed moving to and from a bed to a chair (including a wheelchair)?: None Help needed standing up from a chair using your arms (e.g., wheelchair or bedside chair)?: None Help needed to walk in hospital room?: None Help needed climbing 3-5 steps with a railing? : None 6 Click Score: 24     End of Session   Activity Tolerance: Patient tolerated treatment well Patient left: in chair;with call bell/phone within reach Nurse Communication: Mobility status PT Visit Diagnosis: Dizziness and giddiness (R42)    Time: 9528-4132 PT Time Calculation (min) (ACUTE ONLY): 12 min   Charges:   PT Evaluation $PT Eval Low Complexity: 1 Low          Kreg Shropshire, DPT 09/03/2020, 10:20 AM

## 2020-09-03 NOTE — Progress Notes (Signed)
SLP Cancellation Note  Patient Details Name: Kyle Kelley MRN: [id number] DOB: 09/23/1945   Cancelled treatment:       Reason Eval/Treat Not Completed: SLP screened, no needs identified, will sign off (chart reviewed; consulted NSG then met w/ pt/Wife). Pt denied any difficulty swallowing and is currently on a regular diet; tolerates swallowing pills w/ water per NSG. Pt conversed at conversational level w/out deficits noted; pt and Wife denied any speech-language deficits. Quite pleasant and talkative. No further skilled ST services indicated as pt appears at his baseline. Pt agreed. NSG to reconsult if any change in status while admitted.      Orinda Kenner, MS, CCC-SLP Speech Language Pathologist Rehab Services [phone] [facility] 09/03/2020, 1:49 PM

## 2020-09-03 NOTE — Care Management Obs Status (Signed)
[facility] NOTIFICATION   Patient Details  Name: DAQUAN CRAPPS MRN: [id number] Date of Birth: 1946/05/17   Medicare Observation Status Notification Given:  Yes    Shelbie Hutching, RN 09/03/2020, 9:56 AM

## 2020-09-03 NOTE — Consult Note (Signed)
[facility] for Warfarin Indication: atrial fibrillation  Allergies  Allergen Reactions  . Ace Inhibitors Anaphylaxis  . Rythmol [Propafenone] Shortness Of Breath  . Nitrofurantoin Rash    Patient Measurements: Height: 6\' 2"  (188 cm) Weight: 80.2 kg (176 lb 12.9 oz) IBW/kg (Calculated) : 82.2  Vital Signs: Temp: 98.1 F (36.7 C) (04/07 0742) Temp Source: Oral (04/07 0742) BP: 131/75 (04/07 0742) Pulse Rate: 82 (04/07 0742)  Labs: Recent Labs    09/01/20 1104 09/02/20 1435 09/02/20 1436 09/02/20 1742 09/03/20 0451  HGB  --   --  15.7  --   --   HCT  --   --  47.1  --   --   PLT  --   --  187  --   --   APTT  --   --  34  --   --   LABPROT  --   --  24.5*  --  26.2*  INR 3.0  --  2.3*  --  2.5*  CREATININE  --   --  0.89  --   --   TROPONINIHS  --  6  --  4  --     Estimated Creatinine Clearance: 83.9 mL/min (by C-G formula based on SCr of 0.89 mg/dL).   Medical History: Past Medical History:  Diagnosis Date  . Dyslipidemia   . Hypertension   . Inguinal hernia 2010   Small asymptomatic L inguinal Hernia  . Permanent atrial fibrillation (HCC)    atrial fib   . Pneumonia    hx of   . Prostate cancer ([place]) 07/2012   s/p prostatectomy  . PSA elevation    workup per Dr. Rana Snare, March 2014  . Sigmoid diverticulosis    on colonoscopy in 2010  . Skin cancer 02/2012   Dr Holley Dexter  . Tobacco dependence    rarely smokes-3 cigs per week    Medications:  Medications Prior to Admission  Medication Sig Dispense Refill Last Dose  . diltiazem (TIAZAC) 300 MG 24 hr capsule Take 300 mg by mouth daily.   09/02/2020 at Unknown time  . EPINEPHrine (EPIPEN IJ) Inject 1 application as directed as needed.     . loratadine (CLARITIN) 10 MG tablet Take 10 mg by mouth daily as needed for allergies.   Past Month at Unknown time  . metoprolol succinate (TOPROL-XL) 25 MG 24 hr tablet Take 1 tablet (25 mg total) by mouth daily. Please  keep upcoming appt in March 2022 with Dr. Radford Pax before anymore refills. Thank you 90 tablet 0 09/02/2020 at Unknown time  . warfarin (COUMADIN) 5 MG tablet TAKE AS DIRECTED BY COUMADIN CLINIC 100 tablet 1 09/02/2020 at 0900   Scheduled:  Infusions:  PRN:  Anti-infectives (From admission, onward)   None      Assessment: Pharmacy consulted to restart warfarin for afib. Home warfarin regimen 5 mg daily. No major DDI. Code stroke was called - no tPA.   Date INR Warfarin Dose  4/6 2.3 Pt took warfarin PTA  4/7 2.5 5mg     Goal of Therapy:  INR 2-3 Monitor platelets by anticoagulation protocol: Yes   Plan:  Will give home dose of warfarin today (therapeutic INR).   Daily INR ordered. CBC at least every 3 days.    Lu Duffel, PharmD, BCPS Clinical Pharmacist 09/03/2020 8:15 AM

## 2020-09-03 NOTE — Progress Notes (Signed)
OT Cancellation Note  Patient Details Name: Kyle Kelley MRN: [id number] DOB: 08-Jun-1945   Cancelled Treatment:    Reason Eval/Treat Not Completed: OT screened, no needs identified, will sign off. Order received, chart reviewed. Upon arrival to room, pt walking around room with wife present. Pt reports baseline independence to perform ADLs and mobility tasks and denies any strength, sensory, coordination, cognitive, or visual deficits. Currently pt reporting symptoms of dizziness and poor balance have resolved. No skilled OT needs identified. Will sign off. Please re-consult if additional OT needs arise.  Fredirick Maudlin, OTR/L [place]

## 2020-09-03 NOTE — TOC Initial Note (Signed)
Transition of Care [facility]) - Initial/Assessment Note    Patient Details  Name: Kyle Kelley MRN: [id number] Date of Birth: 1946/02/29  Transition of [facility]) CM/SW Contact:    Shelbie Hutching, RN Phone Number: 09/03/2020, 11:36 AM  Clinical Narrative:                 Patient placed in observation for dizziness and weakness.  RNCM met with patient's wife in the room, patient was out of the room for a test at the time.  Patient is from home where wife reports he is very independent and active.  MOON reviewed with wife and instructed her to reach out to this RNCM if patient has any questions about the MOON, copy given.   No needs identified at this time.   Expected Discharge Plan: Home/Self Care Barriers to Discharge: Continued Medical Work up   Patient Goals and CMS Choice        Expected Discharge Plan and Services Expected Discharge Plan: Home/Self Care       Living arrangements for the past 2 months: Single Family Home                   DME Agency: NA       HH Arranged: NA          Prior Living Arrangements/Services Living arrangements for the past 2 months: Single Family Home Lives with:: Spouse Patient language and need for interpreter reviewed:: Yes Do you feel safe going back to the place where you live?: Yes      Need for Family Participation in Patient Care: Yes (Comment) (dizziness and weakness) Care giver support system in place?: Yes (comment) (wife)   Criminal Activity/Legal Involvement Pertinent to Current Situation/Hospitalization: No - Comment as needed  Activities of Daily Living Home Assistive Devices/Equipment: Eyeglasses ADL Screening (condition at time of admission) Patient's cognitive ability adequate to safely complete daily activities?: Yes Is the patient deaf or have difficulty hearing?: No Does the patient have difficulty seeing, even when wearing glasses/contacts?: No Does the patient have difficulty concentrating, remembering, or  making decisions?: No Patient able to express need for assistance with ADLs?: Yes Does the patient have difficulty dressing or bathing?: No Independently performs ADLs?: Yes (appropriate for developmental age) Does the patient have difficulty walking or climbing stairs?: No Weakness of Legs: None Weakness of Arms/Hands: None  Permission Sought/Granted Permission sought to share information with : Case Manager,Family Supports Permission granted to share information with : Yes, Verbal Permission Granted  Share Information with NAME: Manuela Schwartz     Permission granted to share info w Relationship: wife     Emotional Assessment       Orientation: : Oriented to Self,Oriented to Place,Oriented to  Time,Oriented to Situation Alcohol / Substance Use: Not Applicable Psych Involvement: No (comment)  Admission diagnosis:  Dizziness [R42] Atrial fibrillation, unspecified type [facility]) [I48.91] Patient Active Problem List   Diagnosis Date Noted  . Dizziness 09/02/2020  . Tobacco abuse 09/02/2020  . Parotid mass 09/02/2020  . Atrial fibrillation ([place])   . Malignant neoplasm of prostate ([place]) 09/05/2016  . Encounter for therapeutic drug monitoring 07/09/2013  . Essential hypertension, benign 05/08/2013  . Pure hypercholesterolemia 05/08/2013  . Permanent atrial fibrillation ([place]) 03/05/2013   PCP:  Josetta Huddle, MD Pharmacy:   [id number] - [place], [address] [place] [address] Phone: [phone] Fax: [phone]     Social Determinants  of Health (SDOH) Interventions    Readmission Risk Interventions No flowsheet data found.  

## 2020-09-03 NOTE — Discharge Summary (Addendum)
Physician Discharge Summary  Patient ID: Kyle Kelley MRN: [id number] DOB/AGE: 06/10/1945 75 y.o.  Admit date: 09/02/2020 Discharge date: 09/03/2020  Admission Diagnoses:  Discharge Diagnoses:  Principal Problem:   Dizziness Active Problems:   Permanent atrial fibrillation ([place])   Essential hypertension, benign   Pure hypercholesterolemia   Tobacco abuse   Parotid mass   Discharged Condition: good  Hospital Course:   Kyle Kelley is a 75 y.o. male with medical history significant of hypertension, hyperlipidemia, tobacco abuse, prostate cancer (s/p of prostatectomy), atrial fibrillation on Coumadin, who presents with dizziness and poor balance. Patient has been seen by neurology, stroke work-up was obtained.  MRI did not show any stroke.  Carotid ultrasound did not show any significant stenosis.  Echocardiogram showed mild aortic stenosis with normal ejection fraction.  Patient also has elevated LDL, patient refused to start statin this time, he wished to talk with cardiologist before starting.  Resume anticoagulation for atrial fibrillation.  At this point, patient symptom has resolved.  I would obtain cervical MRI to rule out cervical stenosis.  However, due to recent contrast from head MRI, MRI cannot be obtained today.  Patient does not want to stay in the hospital for test.  Patient can follow-up with PCP to schedule as outpatient.  As for the parotid masses, patient states that he had this mass for 29 years, biopsy confirmed as benign.  He will follow-up with PCP as outpatient.  Consults: neurology  Significant Diagnostic Studies:  Echo: 1. Left ventricular ejection fraction, by estimation, is 55 to 60%. The left ventricle has normal function. The left ventricle has no regional wall motion abnormalities. Left ventricular diastolic parameters were normal. 2. Right ventricular systolic function is normal. The right ventricular size is normal. 3. Left atrial size was mildly  dilated. 4. Right atrial size was mildly dilated. 5. The mitral valve was not well visualized. Trivial mitral valve regurgitation. 6. The aortic valve is calcified. Aortic valve regurgitation is trivial. Mild aortic valve stenosis. BILATERAL CAROTID DUPLEX ULTRASOUND  TECHNIQUE: Pearline Cables scale imaging, color Doppler and duplex ultrasound were performed of bilateral carotid and vertebral arteries in the neck.  COMPARISON:  None.  FINDINGS: Criteria: Quantification of carotid stenosis is based on velocity parameters that correlate the residual internal carotid diameter with NASCET-based stenosis levels, using the diameter of the distal internal carotid lumen as the denominator for stenosis measurement.  The following velocity measurements were obtained:  RIGHT ICA: 117/35 cm/sec CCA: 299/24 cm/sec  SYSTOLIC ICA/CCA RATIO:  1.2  ECA:  133 cm/sec  LEFT  ICA: 96/15 cm/sec  CCA: 268/34 cm/sec  SYSTOLIC ICA/CCA RATIO:  1.0  ECA:  133 cm/sec  RIGHT CAROTID ARTERY: Heterogeneous atherosclerotic plaque in the proximal internal carotid artery. By peak systolic velocity criteria, the estimated stenosis is less than 50%.  RIGHT VERTEBRAL ARTERY:  Patent with normal antegrade flow.  LEFT CAROTID ARTERY: Trace heterogeneous atherosclerotic plaque in the proximal internal carotid artery. By peak systolic velocity criteria, the estimated stenosis is less than 50%.  LEFT VERTEBRAL ARTERY:  Patent with normal antegrade flow.  IMPRESSION: Mild (1-49%) stenosis proximal right internal carotid artery secondary to mild heterogeneous atherosclerotic plaque.  Mild (1-49%) stenosis proximal left internal carotid artery secondary to trace heterogeneous atherosclerotic plaque.  Vertebral arteries are patent with normal antegrade flow.  Signed,  Criselda Peaches, MD, [place]  [facility]  [facility]  Radiology   Electronically Signed   By: Kyle Kelley.D.  On: 09/03/2020 10:24  MRI HEAD WITHOUT AND WITH CONTRAST  MRA HEAD WITHOUT CONTRAST  TECHNIQUE: Multiplanar, multiecho pulse sequences of the brain and surrounding structures were obtained without and with intravenous contrast. Angiographic images of the head were obtained using MRA technique without contrast.  CONTRAST:  74mL GADAVIST GADOBUTROL 1 MMOL/ML IV SOLN  COMPARISON:  Prior CT from earlier the same day.  FINDINGS: MRI HEAD FINDINGS  Brain: Age-appropriate cerebral volume loss. Patchy T2/FLAIR hyperintensity within the periventricular deep white matter both cerebral hemispheres most consistent with chronic small vessel ischemic disease, mild in nature.  No abnormal foci of restricted diffusion to suggest acute or subacute ischemia. Gray-white matter differentiation maintained. No encephalomalacia to suggest chronic cortical infarction. No evidence for acute or chronic intracranial hemorrhage.  No mass lesion, midline shift or mass effect. No hydrocephalus or extra-axial fluid collection. Pituitary gland suprasellar region normal. Midline structures intact. No abnormal enhancement.  Vascular: Major intracranial vascular flow voids are maintained.  Skull and upper cervical spine: Craniocervical junction normal. Bone marrow signal intensity normal. No focal marrow replacing lesion. No scalp soft tissue abnormality.  Sinuses/Orbits: Globes and orbital soft tissues within normal limits. Mild scattered mucosal thickening noted within the ethmoidal air cells. Paranasal sinuses are otherwise clear. Small left mastoid effusion, of doubtful significance. Inner ear structures grossly normal.  Other: Two distinct and separate masses seen involving the right parotid gland, the move more superior which measures 1.8 x 1.6 x 2.0 cm (series 15, image 9), with the more inferior lesion measuring  2.9 x 2.7 x 4.4 cm (series 15, image 5). An additional intraparotid mass seen at the contralateral left parotid gland, measuring 1.4 x 1.1 x 2.1 cm (series 15, image 7). These lesions demonstrate heterogeneous T2 and T1 signal abnormality in demonstrate postcontrast enhancement.  MRA HEAD FINDINGS  ANTERIOR CIRCULATION:  Visualized distal cervical segments of the internal carotid arteries are patent with antegrade flow. Petrous, cavernous, and supraclinoid segments widely patent. A1 segments widely patent. Normal anterior communicating artery complex. Anterior cerebral arteries widely patent to their distal aspects. No M1 stenosis or occlusion. Normal MCA bifurcations. Distal MCA branches perfused and symmetric. Mild distal small vessel atheromatous irregularity noted.  POSTERIOR CIRCULATION:  Both vertebral arteries patent to the vertebrobasilar junction without stenosis. Left vertebral artery slightly dominant. Left PICA patent. Right PICA not seen. Basilar patent to its distal aspect without stenosis. Superior cerebellar arteries patent bilaterally. Left PCA supplied via the basilar as well as a small left posterior communicating artery. Predominant fetal type origin of the right PCA. Both PCAs well perfused to their distal aspects without stenosis.  No intracranial aneurysm.  IMPRESSION: MRI HEAD IMPRESSION:  1. No acute intracranial abnormality. 2. Mild chronic microvascular ischemic disease for age. 3. Three total distinct and separate intraparotid masses, two on the right, and one on the left, as detailed above. ENT referral for further workup and consultation recommended if not already performed.  MRA HEAD IMPRESSION:  1. Negative intracranial MRA for large vessel occlusion, hemodynamically significant stenosis, or other acute vascular abnormality. 2. Mild distal small vessel atheromatous irregularity.   Electronically Signed   By: Jeannine Boga M.D.   On: 09/03/2020 02:07   Treatments: Stroke workup  Discharge Exam: Blood pressure 115/70, pulse 75, temperature 97.7 F (36.5 C), temperature source Oral, resp. rate 15, height 6\' 2"  (1.88 m), weight 80.2 kg, SpO2 98 %. General appearance: alert and cooperative Resp: clear to auscultation bilaterally Cardio: regular rate and rhythm, S1,  S2 normal, no murmur, click, rub or gallop GI: soft, non-tender; bowel sounds normal; no masses,  no organomegaly Extremities: extremities normal, atraumatic, no cyanosis or edema  Disposition: Discharge disposition: 01-Home or Self Care       Discharge Instructions    Diet - low sodium heart healthy   Complete by: As directed    Increase activity slowly   Complete by: As directed      Allergies as of 09/03/2020      Reactions   Ace Inhibitors Anaphylaxis   Rythmol [propafenone] Shortness Of Breath   Nitrofurantoin Rash      Medication List    TAKE these medications   diltiazem 300 MG 24 hr capsule Commonly known as: TIAZAC Take 300 mg by mouth daily.   EPIPEN IJ Inject 1 application as directed as needed.   loratadine 10 MG tablet Commonly known as: CLARITIN Take 10 mg by mouth daily as needed for allergies.   metoprolol succinate 25 MG 24 hr tablet Commonly known as: TOPROL-XL Take 1 tablet (25 mg total) by mouth daily. Please keep upcoming appt in March 2022 with Dr. Radford Pax before anymore refills. Thank you   warfarin 5 MG tablet Commonly known as: COUMADIN Take as directed. If you are unsure how to take this medication, talk to your nurse or doctor. Original instructions: TAKE AS DIRECTED BY COUMADIN CLINIC       Follow-up Information    Josetta Huddle, MD Follow up in 1 week(s).   Specialty: Internal Medicine Contact information: [address] [place] [address] [phone]        Sueanne Margarita, MD .   Specialty: Cardiology Contact information: [phone] [place] [address] [place] NEUR[facility] Follow up.   Contact information: [place] [phone]              Signed: Sharen Hones 09/03/2020, 1:00 PM

## 2020-09-03 NOTE — Progress Notes (Signed)
*  PRELIMINARY RESULTS* Echocardiogram 2D Echocardiogram has been performed.  Kyle Kelley 09/03/2020, 11:05 AM

## 2020-09-09 DIAGNOSIS — G459 Transient cerebral ischemic attack, unspecified: Secondary | ICD-10-CM | POA: Diagnosis not present

## 2020-10-05 ENCOUNTER — Telehealth: Payer: Self-pay | Admitting: Pharmacist

## 2020-10-05 ENCOUNTER — Encounter: Payer: Self-pay | Admitting: Cardiology

## 2020-10-05 ENCOUNTER — Ambulatory Visit (INDEPENDENT_AMBULATORY_CARE_PROVIDER_SITE_OTHER): Payer: Medicare Other | Admitting: Cardiology

## 2020-10-05 ENCOUNTER — Other Ambulatory Visit: Payer: Self-pay

## 2020-10-05 VITALS — BP 126/60 | HR 82 | Ht 74.0 in | Wt 173.8 lb

## 2020-10-05 DIAGNOSIS — I639 Cerebral infarction, unspecified: Secondary | ICD-10-CM | POA: Diagnosis not present

## 2020-10-05 DIAGNOSIS — I6523 Occlusion and stenosis of bilateral carotid arteries: Secondary | ICD-10-CM | POA: Diagnosis not present

## 2020-10-05 DIAGNOSIS — I10 Essential (primary) hypertension: Secondary | ICD-10-CM | POA: Diagnosis not present

## 2020-10-05 DIAGNOSIS — I350 Nonrheumatic aortic (valve) stenosis: Secondary | ICD-10-CM | POA: Diagnosis not present

## 2020-10-05 DIAGNOSIS — E785 Hyperlipidemia, unspecified: Secondary | ICD-10-CM | POA: Diagnosis not present

## 2020-10-05 DIAGNOSIS — R42 Dizziness and giddiness: Secondary | ICD-10-CM

## 2020-10-05 DIAGNOSIS — I4821 Permanent atrial fibrillation: Secondary | ICD-10-CM

## 2020-10-05 MED ORDER — ELIQUIS 5 MG PO TABS
5.0000 mg | ORAL_TABLET | Freq: Two times a day (BID) | ORAL | 0 refills | Status: DC
Start: 2020-10-05 — End: 2020-10-05

## 2020-10-05 MED ORDER — ELIQUIS 5 MG PO TABS: 5.0000 mg | ORAL_TABLET | Freq: Two times a day (BID) | ORAL | 5 refills | Status: DC

## 2020-10-05 NOTE — Patient Instructions (Signed)
Medication Instructions:  Your physician has recommended you make the following change in your medication:  1) STOP taking Coumadin  2) START taking Eliquis   *If you need a refill on your cardiac medications before your next appointment, please call your pharmacy*   Lab Work: Fasting lipids in 6 months If you have labs (blood work) drawn today and your tests are completely normal, you will receive your results only by: [facility] Kitchen MyChart Message (if you have MyChart) OR . A paper copy in the mail If you have any lab test that is abnormal or we need to change your treatment, we will call you to review the results.   Follow-Up: At [facility], you and your health needs are our priority.  As part of our continuing mission to provide you with exceptional heart care, we have created designated Provider Care Teams.  These Care Teams include your primary Cardiologist (physician) and Advanced Practice Providers (APPs -  Physician Assistants and Nurse Practitioners) who all work together to provide you with the care you need, when you need it.  Your next appointment:   1 year(s)  The format for your next appointment:   In Person  Provider:   You may see Fransico Him, MD or one of the following Advanced Practice Providers on your designated Care Team:    Melina Copa, PA-C  Ermalinda Barrios, PA-C

## 2020-10-05 NOTE — Addendum Note (Signed)
Addended by: Antonieta Iba on: 10/05/2020 09:36 AM   Modules accepted: Orders

## 2020-10-05 NOTE — Telephone Encounter (Addendum)
Pt seen by Dr Radford Pax today with plan to change from warfarin to Eliquis. CBC and BMET checked 1 month ago and were stable. Pt qualifies for Eliquis 5mg  BID for afib indication based on age, weight, and renal function. Called pharmacy to make sure copay is affordable - 1 month is $33.90, 3 month is $105. Pt is ok with copay and prefers rx as 1 month supply. I also called in copay card to make his first month free.  INR checked today and was 3.0, he already took warfarin this AM. Advised pt to stop warfarin, and start Eliquis 5mg  BID with first dose 5/11 in AM since his INR will still be therapeutic tomorrow. Pt counseled on difference between warfarin and Eliquis. Follow up INR appt has been canceled. Pt appreciative for assistance.

## 2020-10-05 NOTE — Progress Notes (Signed)
Cardiology Office Note:    Date:  10/05/2020   ID:  JAHDIEL KROL, DOB 01/30/1946, MRN [id number]  PCP:  Josetta Huddle, MD  Cardiologist:  Fransico Him, MD    Referring MD: Josetta Huddle, MD   Chief Complaint  Patient presents with  . Atrial Fibrillation  . Hypertension    History of Present Illness:    Kyle Kelley is a 75 y.o. male with a hx of chronic atrial fibrillation on systemic anticoagulation and HTN. He was recently hospitalized in the ER after a dizzy spell.  He tells me that he drove with his wife to [place] and when he was getting out of the car he got very swimmy headed and uncoordinated.  He says that he did not feel that he was going to pass out.  Neuro saw him and head CT was normal. Head MRI was normal.   2D echo showed normal LVF with mild BAE, mild AS and trivial MR. Carotid dopplers showed 1-49% stenosis with normal vertebral arteries.  It was recommended that he go on statin therapy but he wanted to talk to me.  INR was fine at 2.5.   He is here today for followup and is doing well.  He denies any chest pain or pressure, SOB, DOE, PND, orthopnea, LE edema, palpitations or syncope. He has not had any further dizzy spells.  He is compliant with his meds and is tolerating meds with no SE.    Past Medical History:  Diagnosis Date  . Dyslipidemia   . Hypertension   . Inguinal hernia 2010   Small asymptomatic L inguinal Hernia  . Permanent atrial fibrillation (H[facility])    atrial fib   . Pneumonia    hx of   . Prostate cancer ([place]) 07/2012   s/p prostatectomy  . PSA elevation    workup per Dr. Rana Snare, March 2014  . Sigmoid diverticulosis    on colonoscopy in 2010  . Skin cancer 02/2012   Dr Holley Dexter  . Tobacco dependence    rarely smokes-3 cigs per week    Past Surgical History:  Procedure Laterality Date  . CARDIOVERSION  08/05/2005   Direct Current Cardioversion  . COLONOSCOPY  2010  . Direct current Cardioversion  08/05/2005  . PROSTATE BIOPSY   09/06/2012   Dr. Rana Snare  . ROBOT ASSISTED LAPAROSCOPIC RADICAL PROSTATECTOMY N/A 10/31/2012   Procedure: ROBOTIC ASSISTED LAPAROSCOPIC RADICAL PROSTATECTOMY;  Surgeon: Bernestine Amass, MD;  Location: [facility];  Service: Urology;  Laterality: N/A;  . TONSILLECTOMY AND ADENOIDECTOMY    . TONSILLECTOMY AND ADENOIDECTOMY      Current Medications: Current Meds  Medication Sig  . diltiazem (TIAZAC) 300 MG 24 hr capsule Take 300 mg by mouth daily.  Marland Kitchen EPINEPHrine (EPIPEN IJ) Inject 1 application as directed as needed.  . loratadine (CLARITIN) 10 MG tablet Take 10 mg by mouth daily as needed for allergies.  . metoprolol succinate (TOPROL-XL) 25 MG 24 hr tablet Take 1 tablet (25 mg total) by mouth daily. Please keep upcoming appt in March 2022 with Dr. Radford Pax before anymore refills. Thank you  . warfarin (COUMADIN) 5 MG tablet TAKE AS DIRECTED BY COUMADIN CLINIC     Allergies:   Ace inhibitors, Rythmol [propafenone], and Nitrofurantoin   Social History   Socioeconomic History  . Marital status: Married    Spouse name: Not on file  . Number of children: Not on file  . Years of education: Not on file  .  Highest education level: Not on file  Occupational History  . Not on file  Tobacco Use  . Smoking status: Current Every Day Smoker    Packs/day: 0.50    Years: 50.00    Pack years: 25.00    Types: Cigarettes  . Smokeless tobacco: Never Used  Vaping Use  . Vaping Use: Never used  Substance and Sexual Activity  . Alcohol use: Yes    Comment: rare, heavier in the past  . Drug use: No  . Sexual activity: Never  Other Topics Concern  . Not on file  Social History Narrative   Tobacco Use cigarettes: Current Smoker   Smoking: Yes, less than 1/2 pk per month   Alcohol: Yes - No Alcohol since June 2005   Caffeine: yes   No recreational drug use   Occupation: Employed   Marital Status: Married 96 years, wife has MS   Children: none   Social Determinants of Systems developer Strain: Not on file  Food Insecurity: Not on file  Transportation Needs: Not on file  Physical Activity: Not on file  Stress: Not on file  Social Connections: Not on file     Family History: The patient's family history includes Cancer in his father; Heart disease in his father; Hyperlipidemia in his father; Hypertension in his mother.  ROS:   Please see the history of present illness.    Review of Systems  Skin: Negative for unusual hair distribution.    All other systems reviewed and negative.   EKGs/Labs/Other Studies Reviewed:    The following studies were reviewed today: EKG  EKG:  EKG is not  ordered today   Recent Labs: 09/02/2020: ALT 20; BUN 16; Creatinine, Ser 0.89; Hemoglobin 15.7; Platelets 187; Potassium 4.6; Sodium 134   Recent Lipid Panel    Component Value Date/Time   CHOL 195 09/03/2020 0451   TRIG 99 09/03/2020 0451   HDL 49 09/03/2020 0451   CHOLHDL 4.0 09/03/2020 0451   VLDL 20 09/03/2020 0451   LDLCALC 126 (H) 09/03/2020 0451    Physical Exam:    VS:  BP 126/60   Pulse 82   Ht 6\' 2"  (1.88 m)   Wt 173 lb 12.8 oz (78.8 kg)   SpO2 98%   BMI 22.31 kg/m     Wt Readings from Last 3 Encounters:  10/05/20 173 lb 12.8 oz (78.8 kg)  09/02/20 176 lb 12.9 oz (80.2 kg)  08/19/20 174 lb (78.9 kg)     GEN: Well nourished, well developed in no acute distress HEENT: Normal NECK: No JVD; No carotid bruits LYMPHATICS: No lymphadenopathy CARDIAC:irregularly irregular, no murmurs, rubs, gallops RESPIRATORY:  Clear to auscultation without rales, wheezing or rhonchi  ABDOMEN: Soft, non-tender, non-distended MUSCULOSKELETAL:  No edema; No deformity  SKIN: Warm and dry NEUROLOGIC:  Alert and oriented x 3 PSYCHIATRIC:  Normal affect    ASSESSMENT:    1. Permanent atrial fibrillation ([place])   2. Essential hypertension, benign   3. Dizziness   4. Bilateral carotid artery stenosis   5. Nonrheumatic aortic valve stenosis   6. Hyperlipidemia LDL  goal <70    PLAN:    In order of problems listed above:  1.  Permanent atrial fibrillation -heart rate is well controlled on exam today -continue Diltiazem ER and warfarin>>I have refilled warfarin today -his INR is followed in coumadin clinic -we discussed possibly changing from warfarin to Eliquis given better bleeding profile with the DOACs -I will have  pharmacy see him today to change -he has not had any bleeding problems  2.  HTN -BP is adequately controlled on exam today -continue Toprol XL 25mg  daily and Cardizem ER 300mg  daily and I have refilled these today  3.  Dizziness -workup in ER showed normal LVF -head CT and MRI with no CVA -carotid dopplers with 1-49% stenosis -suspect that he had acute vertigo -he has not had any further episodes  4.  Carotid artery stenosis -1-49% bilateral stenosis by dopplers 08/2020 -no ASA due to warfarin -he refuses statin -repeat dopplers in 1 year  5.  Aortic stenosis -mild by echo 08/2020 -repeat echo in 2 years  6.  HLD -LDL goal < 70 due to carotid stenosis -he refuses statin at this time -he is on a diet with his wife and wants to wait -I will repeat FLP in 6 months -I explained to him that we have other options for lipid lowering meds but he is adamant of trying diet first   Medication Adjustments/Labs and Tests Ordered: Current medicines are reviewed at length with the patient today.  Concerns regarding medicines are outlined above.  No orders of the defined types were placed in this encounter.  No orders of the defined types were placed in this encounter.   Signed, Fransico Him, MD  10/05/2020 9:30 AM    [place]

## 2020-10-06 ENCOUNTER — Other Ambulatory Visit: Payer: Self-pay | Admitting: Cardiology

## 2020-11-20 DIAGNOSIS — R319 Hematuria, unspecified: Secondary | ICD-10-CM | POA: Diagnosis not present

## 2020-12-11 ENCOUNTER — Telehealth: Payer: Self-pay | Admitting: Cardiology

## 2020-12-11 DIAGNOSIS — R319 Hematuria, unspecified: Secondary | ICD-10-CM | POA: Diagnosis not present

## 2020-12-11 DIAGNOSIS — I4891 Unspecified atrial fibrillation: Secondary | ICD-10-CM | POA: Diagnosis not present

## 2020-12-11 DIAGNOSIS — D6869 Other thrombophilia: Secondary | ICD-10-CM | POA: Diagnosis not present

## 2020-12-11 NOTE — Telephone Encounter (Signed)
Spoke with pt's wife and made her aware of information.  Also advised they can check with Urologist to see if any issues.  Wife appreciative for call.

## 2020-12-11 NOTE — Telephone Encounter (Signed)
Pt c/o medication issue:  1. Name of Medication:  apixaban (ELIQUIS) 5 MG TABS tablet  2. How are you currently taking this medication (dosage and times per day)? As prescribed 8 am and 8 PM   3. Are you having a reaction (difficulty breathing--STAT)? Yes   4. What is your medication issue? Kyle Kelley is calling with Kyle Kelley stating three weeks ago he was having blood in his urine. He went to his PCP in regards to it and they advised him he had a UTI, but there was nothing in the culture. During Kyle Kelley last appt with our office Dr. Radford Pax switched him from coumadin that he had be on for 18 years to Eliquis. After the UTI was resolved the blood in his urine had stopped until last night. Both Kyle Kelley and [name] state last night his urine was real red. This morning it has calmed down and is more a pink color. They are wanting to know if this could be a side effect to the Eliquis due to them seeing online it can be. They are hoping to come in to get a test ran at the office today to figure out what is going on before the weekend. Please advise.

## 2020-12-11 NOTE — Telephone Encounter (Signed)
Wife calling back stating Kyle Kelley has an appt with his PCP Dr. Inda Merlin at 11:00 AM today. They are on their way now. Hoping they could come by the office in regards to this after the appt. Please call the cell number now listed in the telephone note contact info for Kyle Kelley so they can be reached. Please advise.

## 2020-12-11 NOTE — Telephone Encounter (Signed)
Patient needs to keep PCP appointment and follow recommendations.  If continuing having blood in urine after following PCP recommendations, will need to report to ER for further diagnostics.

## 2020-12-15 DIAGNOSIS — Z8546 Personal history of malignant neoplasm of prostate: Secondary | ICD-10-CM | POA: Diagnosis not present

## 2020-12-15 DIAGNOSIS — R310 Gross hematuria: Secondary | ICD-10-CM | POA: Diagnosis not present

## 2020-12-24 DIAGNOSIS — K5730 Diverticulosis of large intestine without perforation or abscess without bleeding: Secondary | ICD-10-CM | POA: Diagnosis not present

## 2020-12-24 DIAGNOSIS — K449 Diaphragmatic hernia without obstruction or gangrene: Secondary | ICD-10-CM | POA: Diagnosis not present

## 2020-12-24 DIAGNOSIS — R310 Gross hematuria: Secondary | ICD-10-CM | POA: Diagnosis not present

## 2020-12-24 DIAGNOSIS — N200 Calculus of kidney: Secondary | ICD-10-CM | POA: Diagnosis not present

## 2021-02-24 DIAGNOSIS — Z23 Encounter for immunization: Secondary | ICD-10-CM | POA: Diagnosis not present

## 2021-03-02 DIAGNOSIS — L812 Freckles: Secondary | ICD-10-CM | POA: Diagnosis not present

## 2021-03-02 DIAGNOSIS — D225 Melanocytic nevi of trunk: Secondary | ICD-10-CM | POA: Diagnosis not present

## 2021-03-02 DIAGNOSIS — L821 Other seborrheic keratosis: Secondary | ICD-10-CM | POA: Diagnosis not present

## 2021-03-02 DIAGNOSIS — D1801 Hemangioma of skin and subcutaneous tissue: Secondary | ICD-10-CM | POA: Diagnosis not present

## 2021-03-02 DIAGNOSIS — D2272 Melanocytic nevi of left lower limb, including hip: Secondary | ICD-10-CM | POA: Diagnosis not present

## 2021-03-02 DIAGNOSIS — Z85828 Personal history of other malignant neoplasm of skin: Secondary | ICD-10-CM | POA: Diagnosis not present

## 2021-04-08 ENCOUNTER — Other Ambulatory Visit: Payer: Self-pay

## 2021-04-08 ENCOUNTER — Other Ambulatory Visit: Payer: Medicare Other | Admitting: *Deleted

## 2021-04-08 DIAGNOSIS — I4821 Permanent atrial fibrillation: Secondary | ICD-10-CM | POA: Diagnosis not present

## 2021-04-08 DIAGNOSIS — E785 Hyperlipidemia, unspecified: Secondary | ICD-10-CM

## 2021-04-08 LAB — LIPID PANEL
Chol/HDL Ratio: 3.7 ratio (ref 0.0–5.0)
Cholesterol, Total: 180 mg/dL (ref 100–199)
HDL: 49 mg/dL (ref >39)
LDL Chol Calc (NIH): 117 mg/dL — ABNORMAL HIGH (ref 0–99)
Triglycerides: 74 mg/dL (ref 0–149)
VLDL Cholesterol Cal: 14 mg/dL (ref 5–40)

## 2021-04-08 LAB — ALT: ALT: 19 IU/L (ref 0–44)

## 2021-04-15 DIAGNOSIS — Z0001 Encounter for general adult medical examination with abnormal findings: Secondary | ICD-10-CM | POA: Diagnosis not present

## 2021-04-15 DIAGNOSIS — D6869 Other thrombophilia: Secondary | ICD-10-CM | POA: Diagnosis not present

## 2021-04-15 DIAGNOSIS — Z1211 Encounter for screening for malignant neoplasm of colon: Secondary | ICD-10-CM | POA: Diagnosis not present

## 2021-04-15 DIAGNOSIS — Z8546 Personal history of malignant neoplasm of prostate: Secondary | ICD-10-CM | POA: Diagnosis not present

## 2021-04-15 DIAGNOSIS — I4891 Unspecified atrial fibrillation: Secondary | ICD-10-CM | POA: Diagnosis not present

## 2021-04-15 DIAGNOSIS — E782 Mixed hyperlipidemia: Secondary | ICD-10-CM | POA: Diagnosis not present

## 2021-04-15 DIAGNOSIS — J302 Other seasonal allergic rhinitis: Secondary | ICD-10-CM | POA: Diagnosis not present

## 2021-04-15 DIAGNOSIS — E559 Vitamin D deficiency, unspecified: Secondary | ICD-10-CM | POA: Diagnosis not present

## 2021-04-15 DIAGNOSIS — R319 Hematuria, unspecified: Secondary | ICD-10-CM | POA: Diagnosis not present

## 2021-04-15 DIAGNOSIS — Z8673 Personal history of transient ischemic attack (TIA), and cerebral infarction without residual deficits: Secondary | ICD-10-CM | POA: Diagnosis not present

## 2021-04-15 DIAGNOSIS — F17210 Nicotine dependence, cigarettes, uncomplicated: Secondary | ICD-10-CM | POA: Diagnosis not present

## 2021-04-21 DIAGNOSIS — Z8546 Personal history of malignant neoplasm of prostate: Secondary | ICD-10-CM | POA: Diagnosis not present

## 2021-04-28 ENCOUNTER — Other Ambulatory Visit: Payer: Self-pay | Admitting: Cardiology

## 2021-04-28 DIAGNOSIS — I4821 Permanent atrial fibrillation: Secondary | ICD-10-CM

## 2021-04-28 NOTE — Telephone Encounter (Signed)
Eliquis 5mg  refill request received. Patient is 75 years old, weight-78.8kg, Crea-0.89 on 09/02/2020, Diagnosis-Afib and last seen by Dr. Radford Pax on 10/05/2020. Dose is appropriate based on dosing criteria. Will send in refill to requested pharmacy.

## 2021-04-30 DIAGNOSIS — Z8546 Personal history of malignant neoplasm of prostate: Secondary | ICD-10-CM | POA: Diagnosis not present

## 2021-07-13 DIAGNOSIS — H02043 Spastic entropion of right eye, unspecified eyelid: Secondary | ICD-10-CM | POA: Diagnosis not present

## 2021-07-13 DIAGNOSIS — H25812 Combined forms of age-related cataract, left eye: Secondary | ICD-10-CM | POA: Diagnosis not present

## 2021-07-13 DIAGNOSIS — H3589 Other specified retinal disorders: Secondary | ICD-10-CM | POA: Diagnosis not present

## 2021-07-13 DIAGNOSIS — H25811 Combined forms of age-related cataract, right eye: Secondary | ICD-10-CM | POA: Diagnosis not present

## 2021-07-13 DIAGNOSIS — Z01818 Encounter for other preprocedural examination: Secondary | ICD-10-CM | POA: Diagnosis not present

## 2021-07-22 DIAGNOSIS — H25811 Combined forms of age-related cataract, right eye: Secondary | ICD-10-CM | POA: Diagnosis not present

## 2021-08-05 DIAGNOSIS — H25812 Combined forms of age-related cataract, left eye: Secondary | ICD-10-CM | POA: Diagnosis not present

## 2021-08-31 DIAGNOSIS — Z85828 Personal history of other malignant neoplasm of skin: Secondary | ICD-10-CM | POA: Diagnosis not present

## 2021-08-31 DIAGNOSIS — L570 Actinic keratosis: Secondary | ICD-10-CM | POA: Diagnosis not present

## 2021-08-31 DIAGNOSIS — L821 Other seborrheic keratosis: Secondary | ICD-10-CM | POA: Diagnosis not present

## 2021-08-31 DIAGNOSIS — D2272 Melanocytic nevi of left lower limb, including hip: Secondary | ICD-10-CM | POA: Diagnosis not present

## 2021-08-31 DIAGNOSIS — D225 Melanocytic nevi of trunk: Secondary | ICD-10-CM | POA: Diagnosis not present

## 2021-09-16 DIAGNOSIS — H02002 Unspecified entropion of right lower eyelid: Secondary | ICD-10-CM | POA: Diagnosis not present

## 2021-09-27 ENCOUNTER — Other Ambulatory Visit: Payer: Self-pay | Admitting: Cardiology

## 2021-10-05 ENCOUNTER — Ambulatory Visit (HOSPITAL_COMMUNITY)
Admission: RE | Admit: 2021-10-05 | Discharge: 2021-10-05 | Disposition: A | Discharge status: Home / Self Care | Payer: Medicare Other | Source: Ambulatory Visit | Attending: Cardiology | Admitting: Cardiology

## 2021-10-05 DIAGNOSIS — I4821 Permanent atrial fibrillation: Secondary | ICD-10-CM | POA: Insufficient documentation

## 2021-10-05 DIAGNOSIS — I6523 Occlusion and stenosis of bilateral carotid arteries: Secondary | ICD-10-CM | POA: Diagnosis not present

## 2021-10-07 ENCOUNTER — Telehealth: Payer: Self-pay | Admitting: *Deleted

## 2021-10-07 NOTE — Telephone Encounter (Signed)
? ?  Pre-operative Risk Assessment  ?  ?Patient Name: Kyle Kelley  ?DOB: 09-06-1945 ?MRN: [id number]  ? ?  ? ?Request for Surgical Clearance   ? ?Procedure:   ENTROPION REPAIR W/TARSAL STRIP-RIGHT EYE ? ?Date of Surgery:  Clearance 10/20/21                              ?Surgeon:  DR. Delia Chimes ?Surgeon's Group or Practice Name:  [facility] ?Phone number:  [phone] ?Fax number:  [phone] ?  ?Type of Clearance Requested:   ?- Medical  ?- Pharmacy:  Hold Apixaban (Eliquis) x 2-3 DAYS PRIOR TO PROCEDURE AND RESTARTING POD# 3 (TOTAL OF 4-5 DAYS) ?  ?Type of Anesthesia:   IV SEDATION ?  ?Additional requests/questions:   ? ?Signed, ?Julaine Hua   ?10/07/2021, 5:40 PM  ? ?

## 2021-10-08 ENCOUNTER — Telehealth: Payer: Self-pay

## 2021-10-08 NOTE — Telephone Encounter (Signed)
Patient with diagnosis of afib on Eliquis for anticoagulation.   ? ?Procedure: entropion repair with tarsal strip right eye ?Date of procedure: 10/20/21 ? ?CHA2DS2-VASc Score = 3  ?This indicates a 3.2% annual risk of stroke. ?The patient's score is based upon: ?CHF History: 0 ?HTN History: 1 ?Diabetes History: 0 ?Stroke History: 0 ?Vascular Disease History: 1 ?Age Score: 1 ?Gender Score: 0 ?  ?CrCl 22m/min ?Platelet count 187K ? ?Per office protocol, patient can hold Eliquis for 2 days prior to procedure. Ok to restart POD #3 as requested as CV risk is relatively low.  ?

## 2021-10-08 NOTE — Telephone Encounter (Signed)
-----   Message from Sueanne Margarita, MD sent at 10/08/2021  5:04 PM EDT ----- ?1-39% bilateral carotid stenosis.  Repeat dopplers in 1 year.  LDL was 117 in Nov 2022 and needs to be < 70.  Start Atorvastatin '10mg'$  daily and repeat FLP and ALT in 6 weeks ?

## 2021-10-08 NOTE — Telephone Encounter (Signed)
Primary Cardiologist:Traci Radford Pax, MD ? ?Chart reviewed as part of pre-operative protocol coverage. Because of Kyle Kelley's past medical history and time since last visit, he/she will require a follow-up visit in order to better assess preoperative cardiovascular risk. ? ?Pre-op covering staff: ?- Please schedule appointment and call patient to inform them. ?- Please contact requesting surgeon's office via preferred method (i.e, phone, fax) to inform them of need for appointment prior to surgery. ? ?If applicable, this message will also be routed to pharmacy pool and/or primary cardiologist for input on holding anticoagulant/antiplatelet agent as requested below so that this information is available at time of patient's appointment.  ? ?Deberah Pelton, NP  ?10/08/2021, 2:24 PM  ? ?

## 2021-10-08 NOTE — Telephone Encounter (Signed)
Pt has appt 10/14/21 with Christen Bame, NP.  ?

## 2021-10-08 NOTE — Telephone Encounter (Signed)
Attempted phone call to pt.  Left voicemail message to contact office at [phone]. ?

## 2021-10-11 ENCOUNTER — Other Ambulatory Visit: Payer: Self-pay | Admitting: *Deleted

## 2021-10-11 DIAGNOSIS — I6523 Occlusion and stenosis of bilateral carotid arteries: Secondary | ICD-10-CM

## 2021-10-11 DIAGNOSIS — E785 Hyperlipidemia, unspecified: Secondary | ICD-10-CM

## 2021-10-11 NOTE — Telephone Encounter (Signed)
Pt aware of carotid results and does not want take a statin at this time Per pt is going to diet and exercise and latest LDL was 101 . ?

## 2021-10-11 NOTE — Telephone Encounter (Signed)
Patient is returning call.  °

## 2021-10-13 ENCOUNTER — Ambulatory Visit: Payer: Medicare Other | Admitting: Cardiology

## 2021-10-13 ENCOUNTER — Telehealth: Payer: Self-pay | Admitting: Cardiology

## 2021-10-13 NOTE — Telephone Encounter (Signed)
Spoke to pt and wife. ?Aware that at appt tomorrow they can further discuss the lipid clinic referral recommended by Dr Radford Pax, and they will be able to schedule this before leaving office. ?Patient and wife verbalized understanding and agreeable to plan.  ? ?

## 2021-10-13 NOTE — Telephone Encounter (Signed)
Pt would like a call back to clarify appt. Stated they were called yesterday and left a message in regards to lab appt and they need clarification on this.  ?Would like called before 11 AM at [phone] ? ?

## 2021-10-13 NOTE — Progress Notes (Signed)
Cardiology Office Note:    Date:  10/14/2021   ID:  Kyle Kelley, DOB [birth year], MRN [id number]  PCP:  Josetta Huddle, MD   [facility] Providers Cardiologist:  Fransico Him, MD     Referring MD: Josetta Huddle, MD   Chief Complaint: preoperative cardiac evaluation  History of Present Illness:    Kyle Kelley is a very pleasant 76 y.o. male with a hx of permanent atrial fibrillation on chronic anticoagulation, carotid artery stenosis, hyperlipidemia and hypertension.   ER visit 09/02/20 after feeling very swimmy headed and uncoordinated when getting out of the car. He was evaluated by neurology and head CT was normal.  Head MRI was normal.  2D echo showed normal LVEF with mild BAE, mild AAS and trivial MR and carotid Doppler showed 1 to 49% stenosis with normal vertebral arteries  He was last seen in our office on 10/05/2020 by Dr. Radford Pax.  His Coumadin was switched to Eliquis.  He had lipid panel on 04/08/2021 with LDL at 117.  Was advised to start Lipitor 20 mg daily and repeat lipid panel and ALT in 6 weeks.  We received request for clearance for upcoming entropion repair with tarsal strip right eye scheduled for 10/20/21.   Today, he is here with his wife for preoperative evaluation for upcoming right entropion repair.  He reports he is very active constantly doing yard and house and walking for exercise when weather permits. Home BP 116/67, 120/70. He denies chest pain, shortness of breath, lower extremity edema, fatigue, palpitations, melena, hematuria, hemoptysis, diaphoresis, weakness, presyncope, syncope, orthopnea, and PND. According to the Revised Cardiac Risk Index (RCRI), his Perioperative Risk of Major Cardiac Event is (%): 0.9. His Functional Capacity in METs is: 7.59 according to the Duke Activity Status Index (DASI).   Past Medical History:  Diagnosis Date   Dyslipidemia    Hypertension    Inguinal hernia 2010   Small asymptomatic L inguinal Hernia   Permanent  atrial fibrillation (HCC)    atrial fib    Pneumonia    hx of    Prostate cancer ([place]) 07/2012   s/p prostatectomy   PSA elevation    workup per Dr. Rana Snare, March 2014   Sigmoid diverticulosis    on colonoscopy in 2010   Skin cancer 02/2012   Dr Holley Dexter   Tobacco dependence    rarely smokes-3 cigs per week    Past Surgical History:  Procedure Laterality Date   CARDIOVERSION  08/05/2005   Direct Current Cardioversion   COLONOSCOPY  2010   Direct current Cardioversion  08/05/2005   PROSTATE BIOPSY  09/06/2012   Dr. Rana Snare   ROBOT ASSISTED LAPAROSCOPIC RADICAL PROSTATECTOMY N/A 10/31/2012   Procedure: ROBOTIC ASSISTED LAPAROSCOPIC RADICAL PROSTATECTOMY;  Surgeon: Bernestine Amass, MD;  Location: [facility];  Service: Urology;  Laterality: N/A;   TONSILLECTOMY AND ADENOIDECTOMY     TONSILLECTOMY AND ADENOIDECTOMY      Current Medications: Current Meds  Medication Sig   Cholecalciferol (VITAMIN D3) 25 MCG (1000 UT) CAPS Take 1,000 Units by mouth every other day.   diltiazem (TIAZAC) 300 MG 24 hr capsule Take 300 mg by mouth daily.   ELIQUIS 5 MG TABS tablet TAKE 1 TABLET BY MOUTH TWICE A DAY. INSURANCE ONLY PAY FOR 90 DAYS   loratadine (CLARITIN) 10 MG tablet Take 10 mg by mouth daily as needed for allergies.   metoprolol succinate (TOPROL-XL) 25 MG 24 hr tablet TAKE 1 TABLET BY MOUTH  DAILY.     Allergies:   Ace inhibitors, Rythmol [propafenone], and Nitrofurantoin   Social History   Socioeconomic History   Marital status: Married    Spouse name: Not on file   Number of children: Not on file   Years of education: Not on file   Highest education level: Not on file  Occupational History   Not on file  Tobacco Use   Smoking status: Every Day    Packs/day: 0.50    Years: 50.00    Pack years: 25.00    Types: Cigarettes   Smokeless tobacco: Never  Vaping Use   Vaping Use: Never used  Substance and Sexual Activity   Alcohol use: Yes    Comment: rare, heavier in the  past   Drug use: No   Sexual activity: Never  Other Topics Concern   Not on file  Social History Narrative   Tobacco Use cigarettes: Current Smoker   Smoking: Yes, less than 1/2 pk per month   Alcohol: Yes - No Alcohol since June 2005   Caffeine: yes   No recreational drug use   Occupation: Employed   Marital Status: Married 26 years, wife has MS   Children: none   Social Determinants of Radio broadcast assistant Strain: Not on file  Food Insecurity: Not on file  Transportation Needs: Not on file  Physical Activity: Not on file  Stress: Not on file  Social Connections: Not on file     Family History: The patient's family history includes Cancer in his father; Heart disease in his father; Hyperlipidemia in his father; Hypertension in his mother.  ROS:   Please see the history of present illness.  All other systems reviewed and are negative.  Labs/Other Studies Reviewed:    The following studies were reviewed today:  Vas US Carotid Duplex 10/08/21  Right Carotid: Velocities in the right ICA are consistent with a 1-39%  stenosis.  Left Carotid: Velocities in the left ICA are consistent with a 1-39%  stenosis.   Vertebrals: Bilateral vertebral arteries demonstrate antegrade flow.  Subclavians: Normal flow hemodynamics were seen in bilateral  subclavian arteries.   Echo 09/03/20  1. Left ventricular ejection fraction, by estimation, is 55 to 60%. The  left ventricle has normal function. The left ventricle has no regional  wall motion abnormalities. Left ventricular diastolic parameters were  normal.   2. Right ventricular systolic function is normal. The right ventricular  size is normal.   3. Left atrial size was mildly dilated.   4. Right atrial size was mildly dilated.   5. The mitral valve was not well visualized. Trivial mitral valve  regurgitation.   6. The aortic valve is calcified. Aortic valve regurgitation is trivial.  Mild aortic valve stenosis.    Recent Labs: 04/08/2021: ALT 19  Recent Lipid Panel    Component Value Date/Time   CHOL 180 04/08/2021 1117   TRIG 74 04/08/2021 1117   HDL 49 04/08/2021 1117   CHOLHDL 3.7 04/08/2021 1117   CHOLHDL 4.0 09/03/2020 0451   VLDL 20 09/03/2020 0451   LDLCALC 117 (H) 04/08/2021 1117     Risk Assessment/Calculations:    CHA2DS2-VASc Score = 3  This indicates a 3.2% annual risk of stroke. The patient's score is based upon: CHF History: 0 HTN History: 1 Diabetes History: 0 Stroke History: 0 Vascular Disease History: 1 Age Score: 1 Gender Score: 0    Physical Exam:    VS:  BP 138/84  Pulse 93   Ht '6\' 2"'$  (1.88 m)   Wt 166 lb (75.3 kg)   BMI 21.31 kg/m     Wt Readings from Last 3 Encounters:  10/14/21 166 lb (75.3 kg)  10/05/20 173 lb 12.8 oz (78.8 kg)  09/02/20 176 lb 12.9 oz (80.2 kg)     GEN:  Well nourished, well developed in no acute distress HEENT: Normal NECK: No JVD; No carotid bruits CARDIAC: Irregular RR, no murmurs, rubs, gallops RESPIRATORY:  Clear to auscultation without rales, wheezing or rhonchi  ABDOMEN: Soft, non-tender, non-distended MUSCULOSKELETAL:  No edema; No deformity. 2+ pedal pulses, equal bilaterally SKIN: Warm and dry NEUROLOGIC:  Alert and oriented x 3 PSYCHIATRIC:  Normal affect   EKG:  EKG is ordered today.  The ekg ordered today demonstrates atrial fibrillation at 93 bpm, nonspecific T wave abnormality, no acute change from previous   Diagnoses:    1. Bilateral carotid artery stenosis   2. Hyperlipidemia LDL goal <70   3. Permanent atrial fibrillation ([place])   4. Essential hypertension, benign   5. Nonrheumatic aortic valve stenosis   6. Preoperative cardiovascular examination    Assessment and Plan:     Preoperative cardiac evaluation: Request for cardiac clearance for upcoming surgery.  Request to hold apixaban 2 to 3 days prior to surgery. He is easily able to achieve greater than 4 METS activity. His RCRI for MACE is  0.9%. Guidelines for holding Eliquis are as follows: Per office protocol, patient can hold Eliquis for 2 days prior to procedure. Ok to restart POD #3 as requested as CV risk is relatively low. I am sending clearance to Dr. Hollice Espy.   Permanent atrial fibrillation on chronic anticoagulation: HR well controlled.  He is unaware of symptoms of atrial fibrillation.  Continue rate control with diltiazem and Toprol.  No bleeding problems on DOAC. Continue Eliquis.  Hypertension: BP well controlled. No medication changes today.   Hyperlipidemia LDL goal < 70: LDL 121 03/2021. He would like to return for fasting lipids/lfts once he recovers from surgery.  He is agreeable to discuss treatment of elevated LDL.  I provided some information to him on different classes of medications. Would favor referral to lipid clinic for management.  Encouraged mostly plant-based diet low in saturated fat and continue regular exercise.  Bilateral carotid artery stenosis: 1 to 39% bilateral carotid stenosis by ultrasound 10/08/2021.  As noted above LDL goal is less than 70. He is agreeable to discuss cholesterol management after repeat lipid panel in a few weeks.      Disposition: 1 year with Dr. Radford Pax  Medication Adjustments/Labs and Tests Ordered: Current medicines are reviewed at length with the patient today.  Concerns regarding medicines are outlined above.  Orders Placed This Encounter  Procedures   Lipid panel   Hepatic function panel   EKG 12-Lead   No orders of the defined types were placed in this encounter.   Patient Instructions  Medication Instructions:  Your physician recommends that you continue on your current medications as directed. Please refer to the Current Medication list given to you today.  *If you need a refill on your cardiac medications before your next appointment, please call your pharmacy*   Lab Work: CALL BACK TO SCHEDULE YOUR FASTING:  LIPID & LFT  If you have labs (blood work)  drawn today and your tests are completely normal, you will receive your results only by: [place] (if you have MyChart) OR A paper copy in the  mail If you have any lab test that is abnormal or we need to change your treatment, we will call you to review the results.   Testing/Procedures: None ordered    Follow-Up: At [facility], you and your health needs are our priority.  As part of our continuing mission to provide you with exceptional heart care, we have created designated Provider Care Teams.  These Care Teams include your primary Cardiologist (physician) and Advanced Practice Providers (APPs -  Physician Assistants and Nurse Practitioners) who all work together to provide you with the care you need, when you need it.  We recommend signing up for the patient portal called "MyChart".  Sign up information is provided on this After Visit Summary.  MyChart is used to connect with patients for Virtual Visits (Telemedicine).  Patients are able to view lab/test results, encounter notes, upcoming appointments, etc.  Non-urgent messages can be sent to your provider as well.   To learn more about what you can do with MyChart, go to [email or web address].    Your next appointment:   12 month(s)  The format for your next appointment:   In Person  Provider:   Fransico Him, MD     Other Instructions     Important Information About Sugar      Mediterranean Diet A Mediterranean diet refers to food and lifestyle choices that are based on the traditions of countries located on the [place]. It focuses on eating more fruits, vegetables, whole grains, beans, nuts, seeds, and heart-healthy fats, and eating less dairy, meat, eggs, and processed foods with added sugar, salt, and fat. This way of eating has been shown to help prevent certain conditions and improve outcomes for people who have chronic diseases, like kidney disease and heart disease. What are tips for  following this plan? Reading food labels Check the serving size of packaged foods. For foods such as rice and pasta, the serving size refers to the amount of cooked product, not dry. Check the total fat in packaged foods. Avoid foods that have saturated fat or trans fats. Check the ingredient list for added sugars, such as corn syrup. Shopping  Buy a variety of foods that offer a balanced diet, including: Fresh fruits and vegetables (produce). Grains, beans, nuts, and seeds. Some of these may be available in unpackaged forms or large amounts (in bulk). Fresh seafood. Poultry and eggs. Low-fat dairy products. Buy whole ingredients instead of prepackaged foods. Buy fresh fruits and vegetables in-season from local farmers markets. Buy plain frozen fruits and vegetables. If you do not have access to quality fresh seafood, buy precooked frozen shrimp or canned fish, such as tuna, salmon, or sardines. Stock your pantry so you always have certain foods on hand, such as olive oil, canned tuna, canned tomatoes, rice, pasta, and beans. Cooking Cook foods with extra-virgin olive oil instead of using butter or other vegetable oils. Have meat as a side dish, and have vegetables or grains as your main dish. This means having meat in small portions or adding small amounts of meat to foods like pasta or stew. Use beans or vegetables instead of meat in common dishes like chili or lasagna. Experiment with different cooking methods. Try roasting, broiling, steaming, and sauting vegetables. Add frozen vegetables to soups, stews, pasta, or rice. Add nuts or seeds for added healthy fats and plant protein at each meal. You can add these to yogurt, salads, or vegetable dishes. Marinate fish or vegetables using olive oil, lemon juice, garlic,  and fresh herbs. Meal planning Plan to eat one vegetarian meal one day each week. Try to work up to two vegetarian meals, if possible. Eat seafood two or more times a  week. Have healthy snacks readily available, such as: Vegetable sticks with hummus. Greek yogurt. Fruit and nut trail mix. Eat balanced meals throughout the week. This includes: Fruit: 2-3 servings a day. Vegetables: 4-5 servings a day. Low-fat dairy: 2 servings a day. Fish, poultry, or lean meat: 1 serving a day. Beans and legumes: 2 or more servings a week. Nuts and seeds: 1-2 servings a day. Whole grains: 6-8 servings a day. Extra-virgin olive oil: 3-4 servings a day. Limit red meat and sweets to only a few servings a month. Lifestyle  Cook and eat meals together with your family, when possible. Drink enough fluid to keep your urine pale yellow. Be physically active every day. This includes: Aerobic exercise like running or swimming. Leisure activities like gardening, walking, or housework. Get 7-8 hours of sleep each night. If recommended by your health care provider, drink red wine in moderation. This means 1 glass a day for nonpregnant women and 2 glasses a day for men. A glass of wine equals 5 oz (150 mL). What foods should I eat? Fruits Apples. Apricots. Avocado. Berries. Bananas. Cherries. Dates. Figs. Grapes. Lemons. Melon. Oranges. Peaches. Plums. Pomegranate. Vegetables Artichokes. Beets. Broccoli. Cabbage. Carrots. Eggplant. Green beans. Chard. Kale. Spinach. Onions. Leeks. Peas. Squash. Tomatoes. Peppers. Radishes. Grains Whole-grain pasta. Brown rice. Bulgur wheat. Polenta. Couscous. Whole-wheat bread. [place]. Meats and other proteins Beans. Almonds. Sunflower seeds. Pine nuts. Peanuts. [place]. Salmon. Scallops. Shrimp. [place]. Tilapia. Clams. Oysters. Eggs. Poultry without skin. Dairy Low-fat milk. Cheese. Greek yogurt. Fats and oils Extra-virgin olive oil. Avocado oil. Grapeseed oil. Beverages Water. Red wine. Herbal tea. Sweets and desserts Greek yogurt with honey. Baked apples. Poached pears. Trail mix. Seasonings and condiments Basil. Cilantro.  Coriander. Cumin. Mint. Parsley. Sage. Rosemary. Tarragon. Garlic. Oregano. Thyme. Pepper. Balsamic vinegar. Tahini. Hummus. Tomato sauce. Olives. Mushrooms. The items listed above may not be a complete list of foods and beverages you can eat. Contact a dietitian for more information. What foods should I limit? This is a list of foods that should be eaten rarely or only on special occasions. Fruits Fruit canned in syrup. Vegetables Deep-fried potatoes (french fries). Grains Prepackaged pasta or rice dishes. Prepackaged cereal with added sugar. Prepackaged snacks with added sugar. Meats and other proteins Beef. Pork. Lamb. Poultry with skin. Hot dogs. Berniece Salines. Dairy Ice cream. Sour cream. Whole milk. Fats and oils Butter. Canola oil. Vegetable oil. Beef fat (tallow). Lard. Beverages Juice. Sugar-sweetened soft drinks. Beer. Liquor and spirits. Sweets and desserts Cookies. Cakes. Pies. Candy. Seasonings and condiments Mayonnaise. Pre-made sauces and marinades. The items listed above may not be a complete list of foods and beverages you should limit. Contact a dietitian for more information. Summary The Mediterranean diet includes both food and lifestyle choices. Eat a variety of fresh fruits and vegetables, beans, nuts, seeds, and whole grains. Limit the amount of red meat and sweets that you eat. If recommended by your health care provider, drink red wine in moderation. This means 1 glass a day for nonpregnant women and 2 glasses a day for men. A glass of wine equals 5 oz (150 mL). This information is not intended to replace advice given to you by your health care provider. Make sure you discuss any questions you have with your health care provider. Document Revised: 06/21/2019  Document Reviewed: 04/18/2019 Elsevier Patient Education  [place], Emmaline Life, [place]  10/14/2021 2:41 PM    [place]

## 2021-10-14 ENCOUNTER — Ambulatory Visit (INDEPENDENT_AMBULATORY_CARE_PROVIDER_SITE_OTHER): Payer: Medicare Other | Admitting: Nurse Practitioner

## 2021-10-14 ENCOUNTER — Encounter: Payer: Self-pay | Admitting: Nurse Practitioner

## 2021-10-14 VITALS — BP 138/84 | HR 93 | Ht 74.0 in | Wt 166.0 lb

## 2021-10-14 DIAGNOSIS — I10 Essential (primary) hypertension: Secondary | ICD-10-CM

## 2021-10-14 DIAGNOSIS — I6523 Occlusion and stenosis of bilateral carotid arteries: Secondary | ICD-10-CM | POA: Diagnosis not present

## 2021-10-14 DIAGNOSIS — I4821 Permanent atrial fibrillation: Secondary | ICD-10-CM

## 2021-10-14 DIAGNOSIS — E785 Hyperlipidemia, unspecified: Secondary | ICD-10-CM

## 2021-10-14 DIAGNOSIS — I350 Nonrheumatic aortic (valve) stenosis: Secondary | ICD-10-CM

## 2021-10-14 DIAGNOSIS — Z01810 Encounter for preprocedural cardiovascular examination: Secondary | ICD-10-CM

## 2021-10-14 NOTE — Patient Instructions (Addendum)
Medication Instructions:  Your physician recommends that you continue on your current medications as directed. Please refer to the Current Medication list given to you today.  *If you need a refill on your cardiac medications before your next appointment, please call your pharmacy*   Lab Work: CALL BACK TO SCHEDULE YOUR FASTING:  LIPID & LFT  If you have labs (blood work) drawn today and your tests are completely normal, you will receive your results only by: [place] (if you have MyChart) OR A paper copy in the mail If you have any lab test that is abnormal or we need to change your treatment, we will call you to review the results.   Testing/Procedures: None ordered    Follow-Up: At [facility], you and your health needs are our priority.  As part of our continuing mission to provide you with exceptional heart care, we have created designated Provider Care Teams.  These Care Teams include your primary Cardiologist (physician) and Advanced Practice Providers (APPs -  Physician Assistants and Nurse Practitioners) who all work together to provide you with the care you need, when you need it.  We recommend signing up for the patient portal called "MyChart".  Sign up information is provided on this After Visit Summary.  MyChart is used to connect with patients for Virtual Visits (Telemedicine).  Patients are able to view lab/test results, encounter notes, upcoming appointments, etc.  Non-urgent messages can be sent to your provider as well.   To learn more about what you can do with MyChart, go to [email or web address].    Your next appointment:   12 month(s)  The format for your next appointment:   In Person  Provider:   Fransico Him, MD     Other Instructions     Important Information About Sugar      Mediterranean Diet A Mediterranean diet refers to food and lifestyle choices that are based on the traditions of countries located on the [place]. It  focuses on eating more fruits, vegetables, whole grains, beans, nuts, seeds, and heart-healthy fats, and eating less dairy, meat, eggs, and processed foods with added sugar, salt, and fat. This way of eating has been shown to help prevent certain conditions and improve outcomes for people who have chronic diseases, like kidney disease and heart disease. What are tips for following this plan? Reading food labels Check the serving size of packaged foods. For foods such as rice and pasta, the serving size refers to the amount of cooked product, not dry. Check the total fat in packaged foods. Avoid foods that have saturated fat or trans fats. Check the ingredient list for added sugars, such as corn syrup. Shopping  Buy a variety of foods that offer a balanced diet, including: Fresh fruits and vegetables (produce). Grains, beans, nuts, and seeds. Some of these may be available in unpackaged forms or large amounts (in bulk). Fresh seafood. Poultry and eggs. Low-fat dairy products. Buy whole ingredients instead of prepackaged foods. Buy fresh fruits and vegetables in-season from local farmers markets. Buy plain frozen fruits and vegetables. If you do not have access to quality fresh seafood, buy precooked frozen shrimp or canned fish, such as tuna, salmon, or sardines. Stock your pantry so you always have certain foods on hand, such as olive oil, canned tuna, canned tomatoes, rice, pasta, and beans. Cooking Cook foods with extra-virgin olive oil instead of using butter or other vegetable oils. Have meat as a side dish, and have vegetables or grains  as your main dish. This means having meat in small portions or adding small amounts of meat to foods like pasta or stew. Use beans or vegetables instead of meat in common dishes like chili or lasagna. Experiment with different cooking methods. Try roasting, broiling, steaming, and sauting vegetables. Add frozen vegetables to soups, stews, pasta, or  rice. Add nuts or seeds for added healthy fats and plant protein at each meal. You can add these to yogurt, salads, or vegetable dishes. Marinate fish or vegetables using olive oil, lemon juice, garlic, and fresh herbs. Meal planning Plan to eat one vegetarian meal one day each week. Try to work up to two vegetarian meals, if possible. Eat seafood two or more times a week. Have healthy snacks readily available, such as: Vegetable sticks with hummus. Greek yogurt. Fruit and nut trail mix. Eat balanced meals throughout the week. This includes: Fruit: 2-3 servings a day. Vegetables: 4-5 servings a day. Low-fat dairy: 2 servings a day. Fish, poultry, or lean meat: 1 serving a day. Beans and legumes: 2 or more servings a week. Nuts and seeds: 1-2 servings a day. Whole grains: 6-8 servings a day. Extra-virgin olive oil: 3-4 servings a day. Limit red meat and sweets to only a few servings a month. Lifestyle  Cook and eat meals together with your family, when possible. Drink enough fluid to keep your urine pale yellow. Be physically active every day. This includes: Aerobic exercise like running or swimming. Leisure activities like gardening, walking, or housework. Get 7-8 hours of sleep each night. If recommended by your health care provider, drink red wine in moderation. This means 1 glass a day for nonpregnant women and 2 glasses a day for men. A glass of wine equals 5 oz (150 mL). What foods should I eat? Fruits Apples. Apricots. Avocado. Berries. Bananas. Cherries. Dates. Figs. Grapes. Lemons. Melon. Oranges. Peaches. Plums. Pomegranate. Vegetables Artichokes. Beets. Broccoli. Cabbage. Carrots. Eggplant. Green beans. Chard. Kale. Spinach. Onions. Leeks. Peas. Squash. Tomatoes. Peppers. Radishes. Grains Whole-grain pasta. Brown rice. Bulgur wheat. Polenta. Couscous. Whole-wheat bread. [place]. Meats and other proteins Beans. Almonds. Sunflower seeds. Pine nuts. Peanuts. [place].  Salmon. Scallops. Shrimp. [place]. Tilapia. Clams. Oysters. Eggs. Poultry without skin. Dairy Low-fat milk. Cheese. Greek yogurt. Fats and oils Extra-virgin olive oil. Avocado oil. Grapeseed oil. Beverages Water. Red wine. Herbal tea. Sweets and desserts Greek yogurt with honey. Baked apples. Poached pears. Trail mix. Seasonings and condiments Basil. Cilantro. Coriander. Cumin. Mint. Parsley. Sage. Rosemary. Tarragon. Garlic. Oregano. Thyme. Pepper. Balsamic vinegar. Tahini. Hummus. Tomato sauce. Olives. Mushrooms. The items listed above may not be a complete list of foods and beverages you can eat. Contact a dietitian for more information. What foods should I limit? This is a list of foods that should be eaten rarely or only on special occasions. Fruits Fruit canned in syrup. Vegetables Deep-fried potatoes (french fries). Grains Prepackaged pasta or rice dishes. Prepackaged cereal with added sugar. Prepackaged snacks with added sugar. Meats and other proteins Beef. Pork. Lamb. Poultry with skin. Hot dogs. Bernie[place] Salines. Dairy Ice cream. Sour cream. Whole milk. Fats and oils Butter. Canola oil. Vegetable oil. Beef fat (tallow). Lard. Beverages Juice. Sugar-sweetened soft drinks. Beer. Liquor and spirits. Sweets and desserts Cookies. Cakes. Pies. Candy. Seasonings and condiments Mayonnaise. Pre-made sauces and marinades. The items listed above may not be a complete list of foods and beverages you should limit. Contact a dietitian for more information. Summary The Mediterranean diet includes both food and lifestyle choices. Eat a  variety of fresh fruits and vegetables, beans, nuts, seeds, and whole grains. Limit the amount of red meat and sweets that you eat. If recommended by your health care provider, drink red wine in moderation. This means 1 glass a day for nonpregnant women and 2 glasses a day for men. A glass of wine equals 5 oz (150 mL). This information is not intended to replace  advice given to you by your health care provider. Make sure you discuss any questions you have with your health care provider. Document Revised: 06/21/2019 Document Reviewed: 04/18/2019 Elsevier Patient Education  [place].

## 2021-10-20 ENCOUNTER — Other Ambulatory Visit: Payer: Self-pay | Admitting: Cardiology

## 2021-10-20 DIAGNOSIS — H02032 Senile entropion of right lower eyelid: Secondary | ICD-10-CM | POA: Diagnosis not present

## 2021-10-20 DIAGNOSIS — H02002 Unspecified entropion of right lower eyelid: Secondary | ICD-10-CM | POA: Diagnosis not present

## 2021-10-20 DIAGNOSIS — H02003 Unspecified entropion of right eye, unspecified eyelid: Secondary | ICD-10-CM | POA: Diagnosis not present

## 2021-10-20 DIAGNOSIS — I4821 Permanent atrial fibrillation: Secondary | ICD-10-CM

## 2021-10-20 NOTE — Telephone Encounter (Signed)
Pt last saw Florene Glen, NP on 10/14/21, last labs 04/15/21 Creat 0.84 at [facility] per [facility], age 76, weight 75.3kg, based on specified criteria pt is on appropriate dosage of Eliquis '5mg'$  BID for afib.  Will refill rx.

## 2021-10-21 ENCOUNTER — Encounter: Payer: Self-pay | Admitting: Cardiology

## 2021-11-09 ENCOUNTER — Other Ambulatory Visit: Payer: Medicare Other

## 2021-11-09 DIAGNOSIS — E785 Hyperlipidemia, unspecified: Secondary | ICD-10-CM

## 2021-11-09 DIAGNOSIS — I6523 Occlusion and stenosis of bilateral carotid arteries: Secondary | ICD-10-CM | POA: Diagnosis not present

## 2021-11-09 DIAGNOSIS — I10 Essential (primary) hypertension: Secondary | ICD-10-CM | POA: Diagnosis not present

## 2021-11-09 DIAGNOSIS — I4821 Permanent atrial fibrillation: Secondary | ICD-10-CM | POA: Diagnosis not present

## 2021-11-09 DIAGNOSIS — I350 Nonrheumatic aortic (valve) stenosis: Secondary | ICD-10-CM | POA: Diagnosis not present

## 2021-11-09 LAB — HEPATIC FUNCTION PANEL
ALT: 21 IU/L (ref 0–44)
AST: 25 IU/L (ref 0–40)
Albumin: 4.3 g/dL (ref 3.7–4.7)
Alkaline Phosphatase: 88 IU/L (ref 44–121)
Bilirubin Total: 0.6 mg/dL (ref 0.0–1.2)
Bilirubin, Direct: 0.17 mg/dL (ref 0.00–0.40)
Total Protein: 7.2 g/dL (ref 6.0–8.5)

## 2021-11-09 LAB — LIPID PANEL
Chol/HDL Ratio: 3.5 ratio (ref 0.0–5.0)
Cholesterol, Total: 176 mg/dL (ref 100–199)
HDL: 50 mg/dL (ref >39)
LDL Chol Calc (NIH): 112 mg/dL — ABNORMAL HIGH (ref 0–99)
Triglycerides: 73 mg/dL (ref 0–149)
VLDL Cholesterol Cal: 14 mg/dL (ref 5–40)

## 2021-11-18 ENCOUNTER — Ambulatory Visit: Payer: Medicare Other

## 2021-11-19 ENCOUNTER — Ambulatory Visit (INDEPENDENT_AMBULATORY_CARE_PROVIDER_SITE_OTHER): Payer: Medicare Other | Admitting: Pharmacist

## 2021-11-19 DIAGNOSIS — I6523 Occlusion and stenosis of bilateral carotid arteries: Secondary | ICD-10-CM | POA: Diagnosis not present

## 2021-11-19 DIAGNOSIS — E785 Hyperlipidemia, unspecified: Secondary | ICD-10-CM

## 2021-11-19 DIAGNOSIS — E7800 Pure hypercholesterolemia, unspecified: Secondary | ICD-10-CM

## 2021-11-19 MED ORDER — ROSUVASTATIN CALCIUM 5 MG PO TABS: 5.0000 mg | ORAL_TABLET | Freq: Every day | ORAL | 11 refills | Status: DC

## 2021-12-26 ENCOUNTER — Other Ambulatory Visit: Payer: Self-pay | Admitting: Cardiology

## 2022-02-01 ENCOUNTER — Ambulatory Visit: Payer: Medicare Other | Attending: Cardiology

## 2022-02-01 DIAGNOSIS — E785 Hyperlipidemia, unspecified: Secondary | ICD-10-CM | POA: Insufficient documentation

## 2022-02-01 LAB — HEPATIC FUNCTION PANEL
ALT: 20 IU/L (ref 0–44)
AST: 23 IU/L (ref 0–40)
Albumin: 4.5 g/dL (ref 3.8–4.8)
Alkaline Phosphatase: 88 IU/L (ref 44–121)
Bilirubin Total: 0.5 mg/dL (ref 0.0–1.2)
Bilirubin, Direct: 0.16 mg/dL (ref 0.00–0.40)
Total Protein: 7.6 g/dL (ref 6.0–8.5)

## 2022-02-01 LAB — LIPID PANEL
Chol/HDL Ratio: 2.7 ratio (ref 0.0–5.0)
Cholesterol, Total: 148 mg/dL (ref 100–199)
HDL: 55 mg/dL (ref >39)
LDL Chol Calc (NIH): 80 mg/dL (ref 0–99)
Triglycerides: 65 mg/dL (ref 0–149)
VLDL Cholesterol Cal: 13 mg/dL (ref 5–40)

## 2022-02-02 ENCOUNTER — Telehealth: Payer: Self-pay

## 2022-02-02 MED ORDER — ROSUVASTATIN CALCIUM 10 MG PO TABS: 10.0000 mg | ORAL_TABLET | Freq: Every day | ORAL | 3 refills | Status: DC

## 2022-02-02 NOTE — Telephone Encounter (Signed)
Patient has been taking his rosuvastatin 5 mg daily. He is willing to try 10 mg. He will let us know if he is unable to tolerate it.

## 2022-02-02 NOTE — Telephone Encounter (Signed)
-----   Message from Rollen Sox, [facility] sent at 02/02/2022  4:31 PM EDT ----- He was started on rosuvastatin '5mg'$  at last lipid visit. Has he stopped taking?  If not, recommend increasing to '10mg'$  daily  ----- Message ----- From: Antonieta Iba, RN Sent: 02/02/2022   8:03 AM EDT To: [facility]  Dr. Radford Pax would like recommendations from PharmD

## 2022-02-08 DIAGNOSIS — S90862A Insect bite (nonvenomous), left foot, initial encounter: Secondary | ICD-10-CM | POA: Diagnosis not present

## 2022-02-08 DIAGNOSIS — Z85828 Personal history of other malignant neoplasm of skin: Secondary | ICD-10-CM | POA: Diagnosis not present

## 2022-02-15 ENCOUNTER — Other Ambulatory Visit: Payer: Self-pay | Admitting: Cardiology

## 2022-02-15 DIAGNOSIS — I4821 Permanent atrial fibrillation: Secondary | ICD-10-CM

## 2022-02-15 NOTE — Telephone Encounter (Signed)
Prescription refill request for Eliquis received. Indication:Afib Last office visit:6/23 Scr:0.8 Age: 76 Weight:75.3 kg  Prescription refilled

## 2022-03-12 DIAGNOSIS — Z23 Encounter for immunization: Secondary | ICD-10-CM | POA: Diagnosis not present

## 2022-04-20 DIAGNOSIS — Z8546 Personal history of malignant neoplasm of prostate: Secondary | ICD-10-CM | POA: Diagnosis not present

## 2022-04-29 DIAGNOSIS — Z8546 Personal history of malignant neoplasm of prostate: Secondary | ICD-10-CM | POA: Diagnosis not present

## 2022-05-19 DIAGNOSIS — Z1331 Encounter for screening for depression: Secondary | ICD-10-CM | POA: Diagnosis not present

## 2022-05-19 DIAGNOSIS — Z91030 Bee allergy status: Secondary | ICD-10-CM | POA: Diagnosis not present

## 2022-05-19 DIAGNOSIS — E782 Mixed hyperlipidemia: Secondary | ICD-10-CM | POA: Diagnosis not present

## 2022-05-19 DIAGNOSIS — Z8546 Personal history of malignant neoplasm of prostate: Secondary | ICD-10-CM | POA: Diagnosis not present

## 2022-05-19 DIAGNOSIS — I10 Essential (primary) hypertension: Secondary | ICD-10-CM | POA: Diagnosis not present

## 2022-05-19 DIAGNOSIS — I4891 Unspecified atrial fibrillation: Secondary | ICD-10-CM | POA: Diagnosis not present

## 2022-05-19 DIAGNOSIS — Z23 Encounter for immunization: Secondary | ICD-10-CM | POA: Diagnosis not present

## 2022-05-19 DIAGNOSIS — Z8673 Personal history of transient ischemic attack (TIA), and cerebral infarction without residual deficits: Secondary | ICD-10-CM | POA: Diagnosis not present

## 2022-05-19 DIAGNOSIS — E559 Vitamin D deficiency, unspecified: Secondary | ICD-10-CM | POA: Diagnosis not present

## 2022-05-19 DIAGNOSIS — Z0000 Encounter for general adult medical examination without abnormal findings: Secondary | ICD-10-CM | POA: Diagnosis not present

## 2022-07-25 DIAGNOSIS — Z1212 Encounter for screening for malignant neoplasm of rectum: Secondary | ICD-10-CM | POA: Diagnosis not present

## 2022-07-25 DIAGNOSIS — Z1211 Encounter for screening for malignant neoplasm of colon: Secondary | ICD-10-CM | POA: Diagnosis not present

## 2022-08-03 LAB — COLOGUARD: COLOGUARD: NEGATIVE

## 2022-09-07 DIAGNOSIS — D2272 Melanocytic nevi of left lower limb, including hip: Secondary | ICD-10-CM | POA: Diagnosis not present

## 2022-09-07 DIAGNOSIS — L853 Xerosis cutis: Secondary | ICD-10-CM | POA: Diagnosis not present

## 2022-09-07 DIAGNOSIS — D225 Melanocytic nevi of trunk: Secondary | ICD-10-CM | POA: Diagnosis not present

## 2022-09-07 DIAGNOSIS — L813 Cafe au lait spots: Secondary | ICD-10-CM | POA: Diagnosis not present

## 2022-09-07 DIAGNOSIS — Z85828 Personal history of other malignant neoplasm of skin: Secondary | ICD-10-CM | POA: Diagnosis not present

## 2022-09-07 DIAGNOSIS — L821 Other seborrheic keratosis: Secondary | ICD-10-CM | POA: Diagnosis not present

## 2022-09-21 ENCOUNTER — Other Ambulatory Visit: Payer: Self-pay | Admitting: Cardiology

## 2022-11-10 ENCOUNTER — Other Ambulatory Visit: Payer: Self-pay | Admitting: Cardiology

## 2022-11-10 DIAGNOSIS — I4821 Permanent atrial fibrillation: Secondary | ICD-10-CM

## 2022-11-10 NOTE — Telephone Encounter (Signed)
Prescription refill request for Eliquis received. Indication: Afib  Last office visit: 10/14/21 ([place])   Scr: 0.9 (05/19/22 via [facility])  Age: 77 Weight: 75.3kg  Office visit overdue. Pt has scheduled appt with Dr Mayford Knife on 11/24/22.   Appropriate dose. Refill sent.

## 2022-11-16 IMAGING — MR MR HEAD WO/W CM
14 of 15 series · 39 of 48 positions shown · IV contrast (gadavist)
Comparison: Prior CT from earlier the same day.

CLINICAL DATA: Initial evaluation for acute dizziness.

EXAM:
MRI HEAD WITHOUT AND WITH CONTRAST
MRA HEAD WITHOUT CONTRAST
TECHNIQUE: Multiplanar, multiecho pulse sequences of the brain and surrounding
structures were obtained without and with intravenous contrast.
Angiographic images of the head were obtained using MRA technique
without contrast.
CONTRAST:  8mL GADAVIST GADOBUTROL 1 MMOL/ML IV SOLN

[Series 5: ax dwi_tracew · axial · 3.0mm · 0.65mm/px · z∈[-12,+117]mm · 2 of 48 slices shown]
[im 1/48]
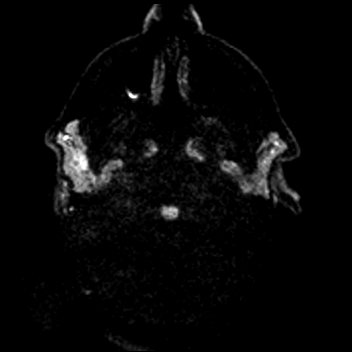
[im 48/48]
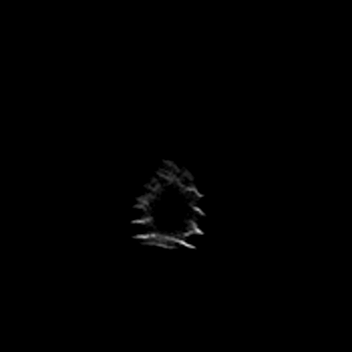

[Series 6: ax dwi_adc · axial · 3.0mm · 0.65mm/px · z∈[-12,+117]mm · 2 of 48 slices shown]
[im 1/48]
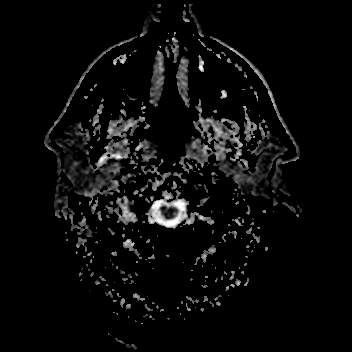
[im 48/48]
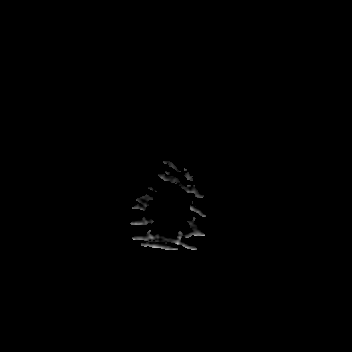

[Series 7: cor dwi_tracew · coronal · 5.0mm · 0.71mm/px · 2 of 42 slices shown]
[im 1/42]
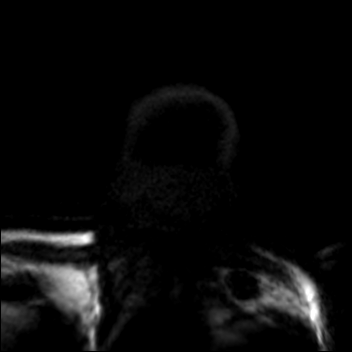
[im 42/42]
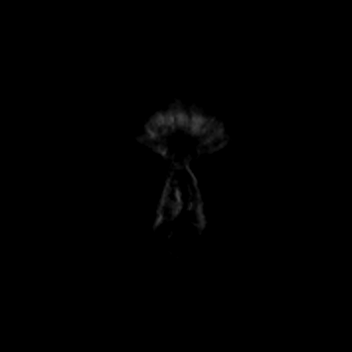

[Series 8: cor dwi_adc · coronal · 5.0mm · 0.71mm/px · 2 of 42 slices shown]
[im 1/42]
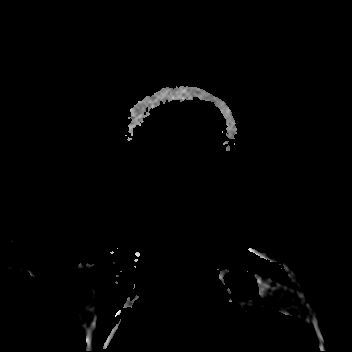
[im 42/42]
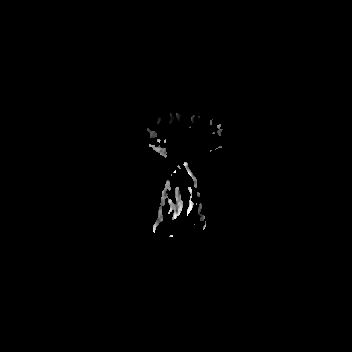

[Series 14: T1 · sagittal · 5.0mm · 0.62mm/px · 1 of 25 slices shown (1 of 2)]
[im 1/25]
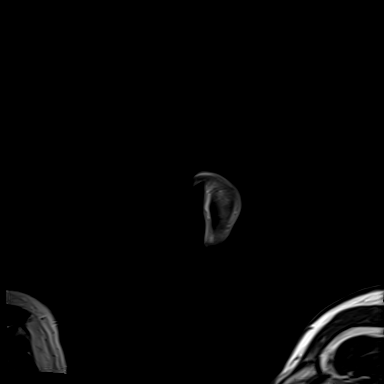

[Series 15: T2 · axial · 5.0mm · 0.53mm/px · z∈[-64,+107]mm · 2 of 35 slices shown (1 of 2)]
[im 1/35]
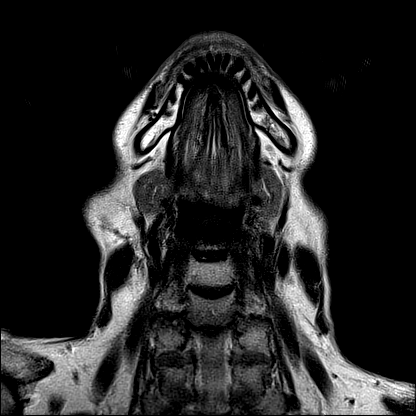
[im 35/35]
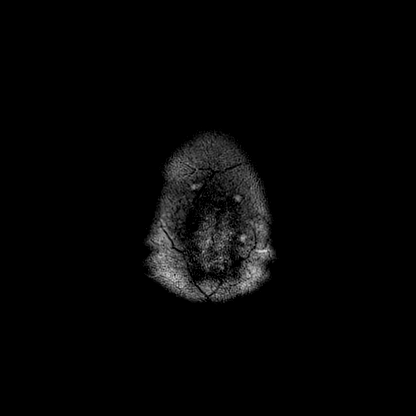

[Series 17: pha_images · axial · 3.0mm · 0.90mm/px · z∈[-49,+98]mm · 2 of 58 slices shown]
[im 1/58]
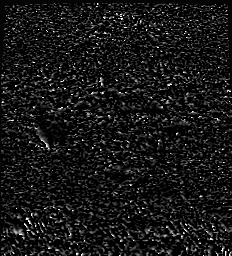
[im 58/58]
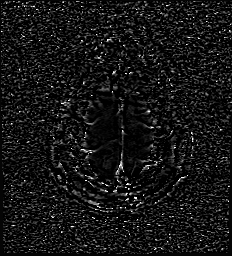

[Series 18: swi_images · axial · 3.0mm · 0.90mm/px · z∈[-49,+98]mm · 3 of 60 slices shown]
[im 1/60]
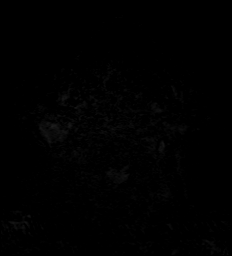
[im 30/60]
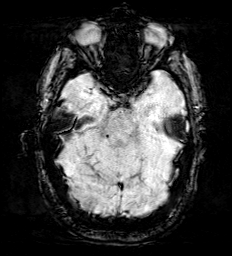
[im 60/60]
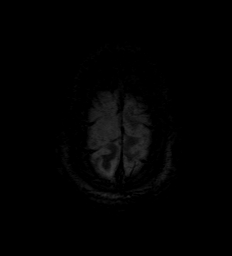

[Series 20: FLAIR · axial · 3.0mm · 0.53mm/px · z∈[-46,+89]mm · 2 of 55 slices shown]
[im 1/55]
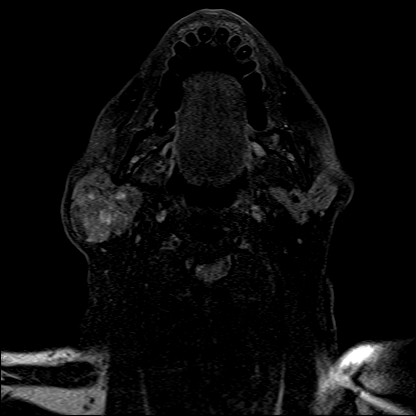
[im 55/55]
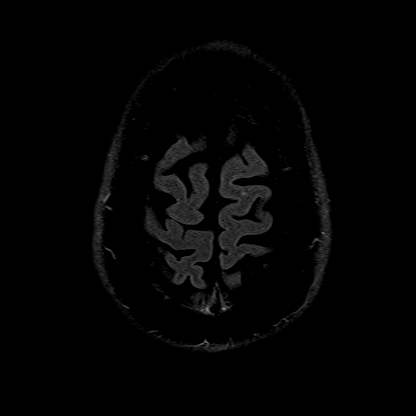

[Series 21: T1 · axial · 1.0mm · 0.98mm/px · z∈[-54,+118]mm · 9 of 208 slices shown (2 of 2)]
[im 1/208]
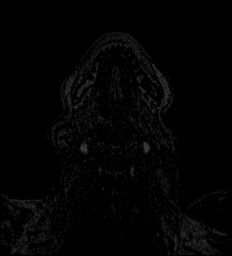
[im 26/208]
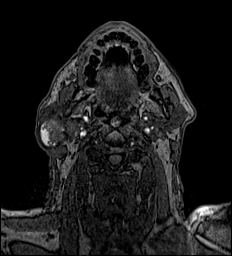
[im 52/208]
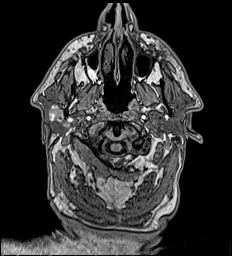
[im 78/208]
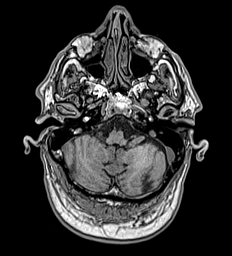
[im 104/208]
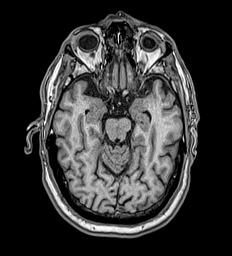
[im 130/208]
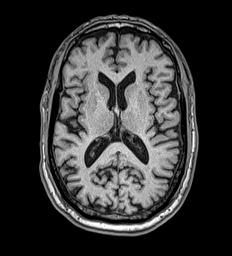
[im 156/208]
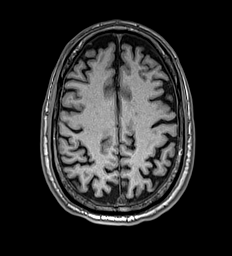
[im 182/208]
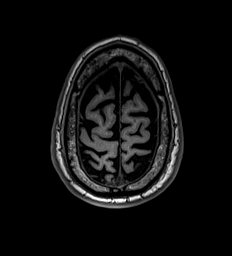
[im 208/208]
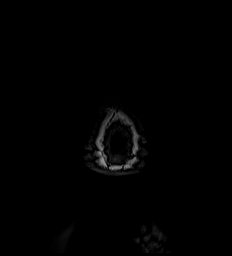

[Series 22: T2 · coronal · 5.0mm · 0.62mm/px · 1 of 29 slices shown (2 of 2)]
[im 1/29]
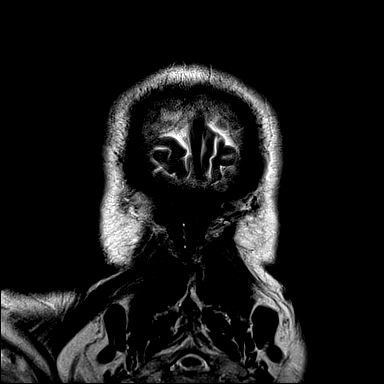

[Series 23: T1 post-contrast · axial · 1.0mm · 0.98mm/px · z∈[-54,+118]mm · 9 of 208 slices shown (1 of 3)]
[im 1/208]
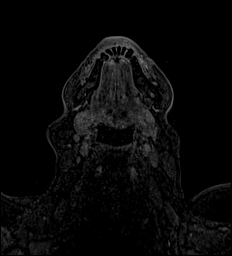
[im 26/208]
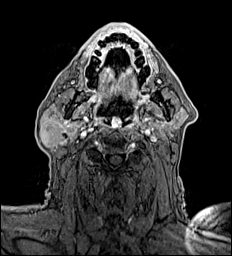
[im 52/208]
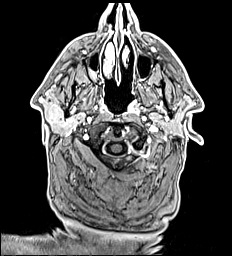
[im 78/208]
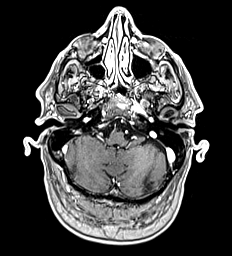
[im 104/208]
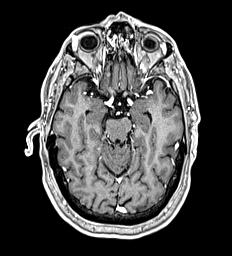
[im 130/208]
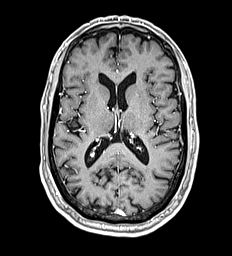
[im 156/208]
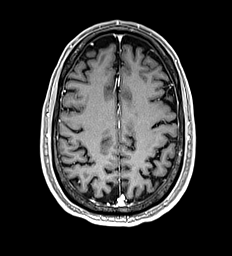
[im 182/208]
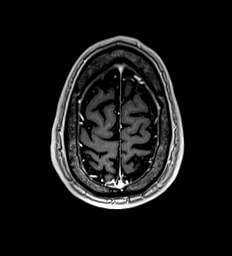
[im 208/208]
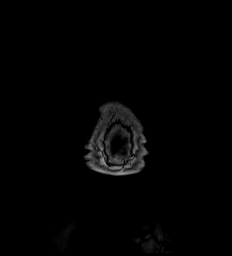

[Series 24: T1 post-contrast · coronal · 5.0mm · 0.62mm/px · 1 of 31 slices shown (2 of 3)]
[im 1/31]
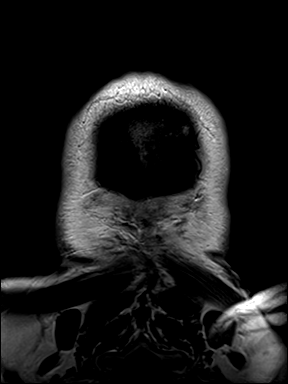

[Series 25: T1 post-contrast · sagittal · 5.0mm · 0.62mm/px · 1 of 25 slices shown (3 of 3)]
[im 1/25]
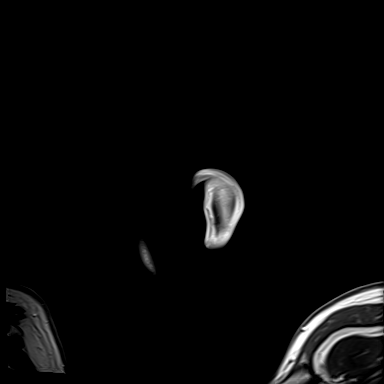

[39 of 48 positions shown; findings below may reference images not displayed]

FINDINGS: MRI HEAD FINDINGS

Brain: Age-appropriate cerebral volume loss. Patchy T2/FLAIR
hyperintensity within the periventricular deep white matter both
cerebral hemispheres most consistent with chronic small vessel
ischemic disease, mild in nature.

No abnormal foci of restricted diffusion to suggest acute or
subacute ischemia. Gray-white matter differentiation maintained. No
encephalomalacia to suggest chronic cortical infarction. No evidence
for acute or chronic intracranial hemorrhage.

No mass lesion, midline shift or mass effect. No hydrocephalus or
extra-axial fluid collection. Pituitary gland suprasellar region
normal. Midline structures intact. No abnormal enhancement.

Vascular: Major intracranial vascular flow voids are maintained.

Skull and upper cervical spine: Craniocervical junction normal. Bone
marrow signal intensity normal. No focal marrow replacing lesion. No
scalp soft tissue abnormality.

Sinuses/Orbits: Globes and orbital soft tissues within normal
limits. Mild scattered mucosal thickening noted within the ethmoidal
air cells. Paranasal sinuses are otherwise clear. Small left mastoid
effusion, of doubtful significance. Inner ear structures grossly
normal.

Other: Two distinct and separate masses seen involving the right
parotid gland, the move more superior which measures 1.8 x 1.6 x
cm (series 15, image 9), with the more inferior lesion measuring
x 2.7 x 4.4 cm (series 15, image 5). An additional intraparotid mass
seen at the contralateral left parotid gland, measuring 1.4 x 1.1 x
2.1 cm (series 15, image 7). These lesions demonstrate heterogeneous
T2 and T1 signal abnormality in demonstrate postcontrast
enhancement.

MRA HEAD FINDINGS

ANTERIOR CIRCULATION:

Visualized distal cervical segments of the internal carotid arteries
are patent with antegrade flow. Petrous, cavernous, and supraclinoid
segments widely patent. A1 segments widely patent. Normal anterior
communicating artery complex. Anterior cerebral arteries widely
patent to their distal aspects. No M1 stenosis or occlusion. Normal
MCA bifurcations. Distal MCA branches perfused and symmetric. Mild
distal small vessel atheromatous irregularity noted.

POSTERIOR CIRCULATION:

Both vertebral arteries patent to the vertebrobasilar junction
without stenosis. Left vertebral artery slightly dominant. Left PICA
patent. Right PICA not seen. Basilar patent to its distal aspect
without stenosis. Superior cerebellar arteries patent bilaterally.
Left PCA supplied via the basilar as well as a small left posterior
communicating artery. Predominant fetal type origin of the right
PCA. Both PCAs well perfused to their distal aspects without
stenosis.

No intracranial aneurysm.
IMPRESSION: MRI HEAD IMPRESSION:

1. No acute intracranial abnormality.
2. Mild chronic microvascular ischemic disease for age.
3. Three total distinct and separate intraparotid masses, two on the
right, and one on the left, as detailed above. ENT referral for
further workup and consultation recommended if not already
performed.

MRA HEAD IMPRESSION:

1. Negative intracranial MRA for large vessel occlusion,
hemodynamically significant stenosis, or other acute vascular
abnormality.
2. Mild distal small vessel atheromatous irregularity.

## 2022-11-17 IMAGING — US US CAROTID DUPLEX BILAT
1 series · 13 of 24 positions shown · non-contrast
Comparison: None.

CLINICAL DATA: Stroke, syncope

EXAM:
BILATERAL CAROTID DUPLEX ULTRASOUND
TECHNIQUE: Gray scale imaging, color Doppler and duplex ultrasound were
performed of bilateral carotid and vertebral arteries in the neck.

[Series 1: us carotid bilateral · 13 of 64 slices shown]
[im 1/64]
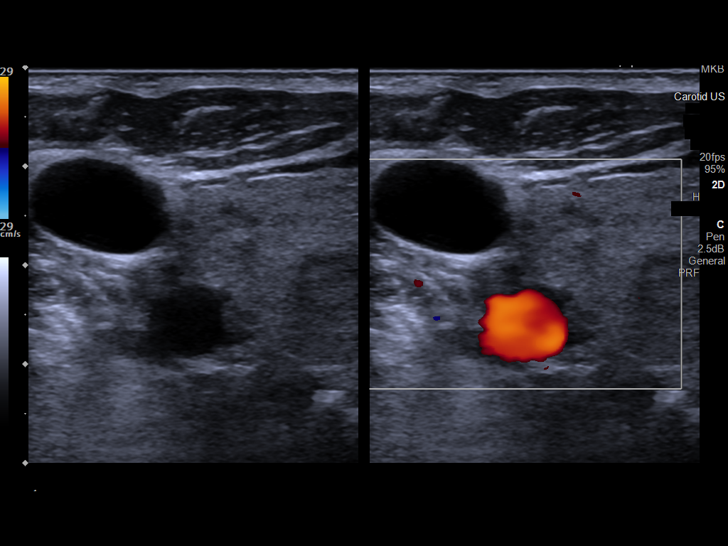
[im 6/64]
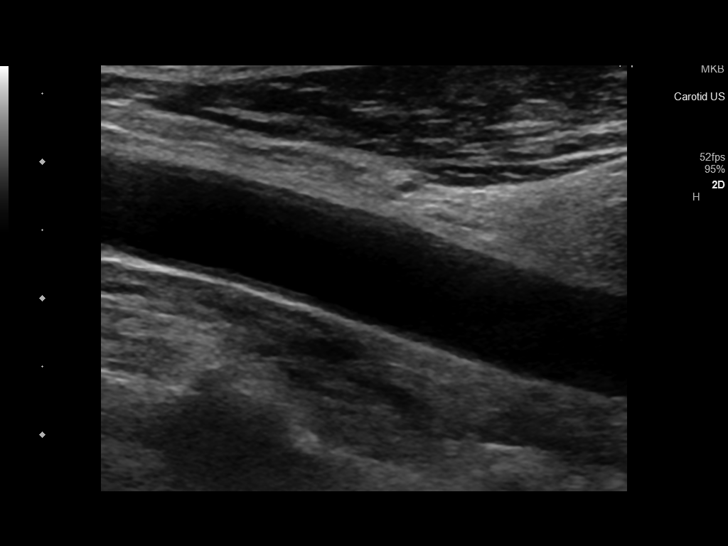
[im 11/64]
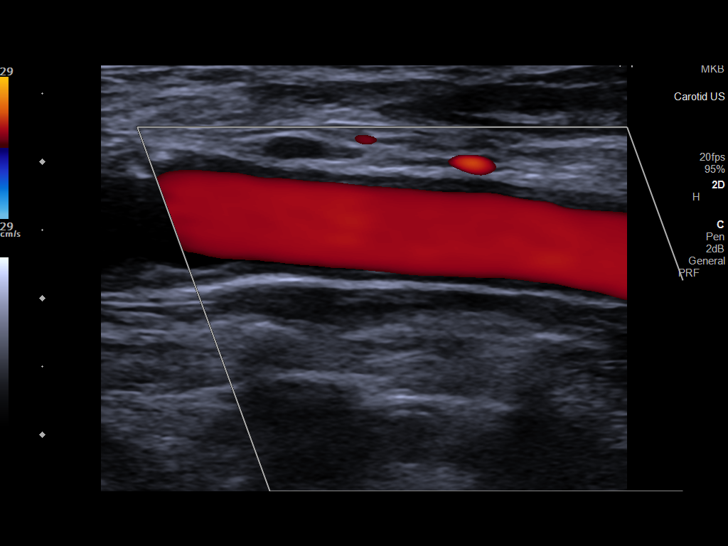
[im 17/64]
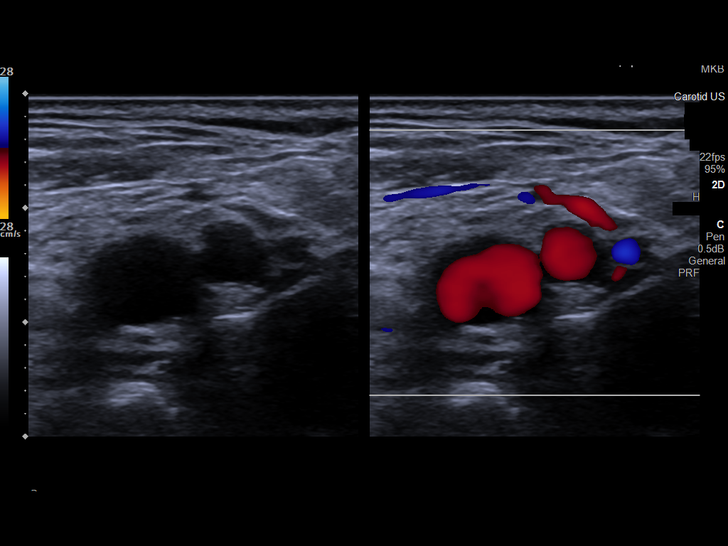
[im 22/64]
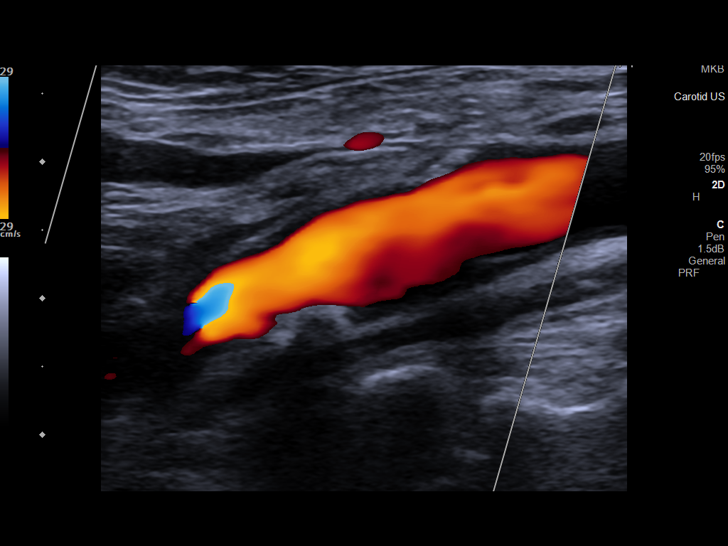
[im 28/64]
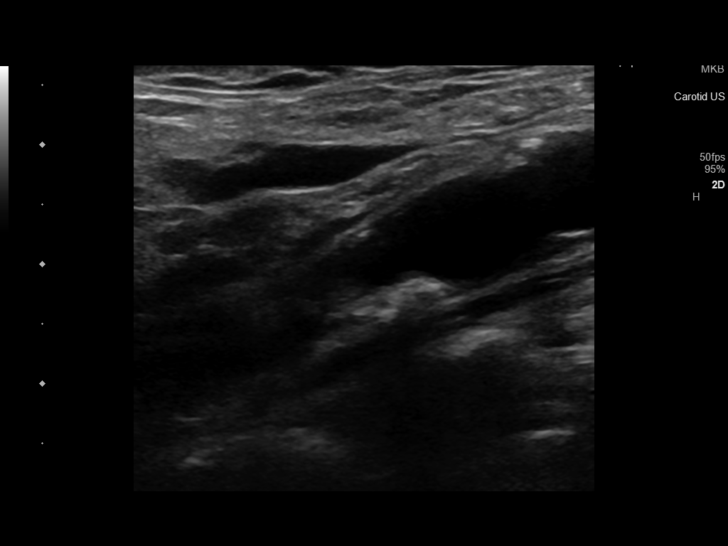
[im 33/64]
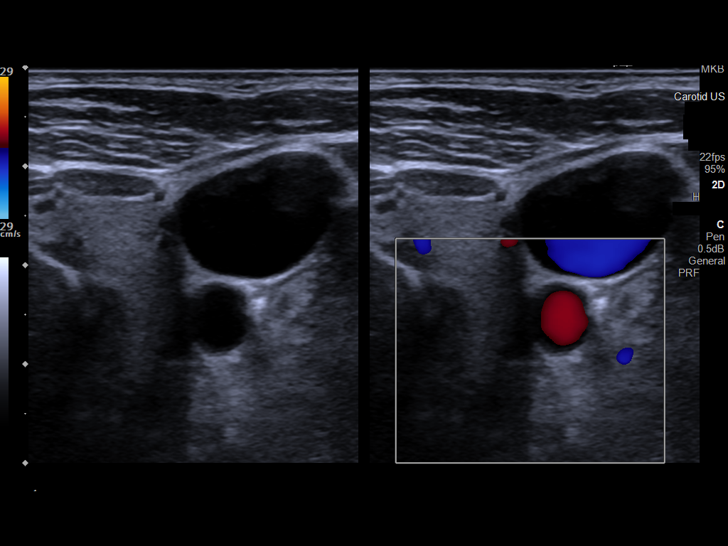
[im 36/64]
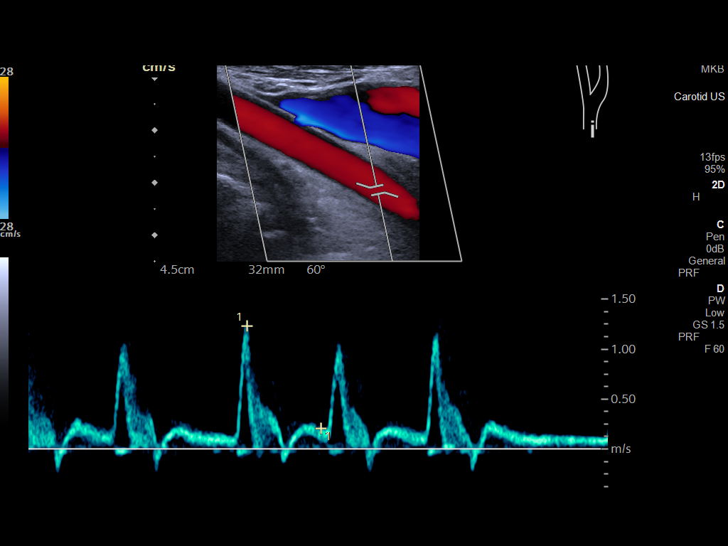
[im 42/64]
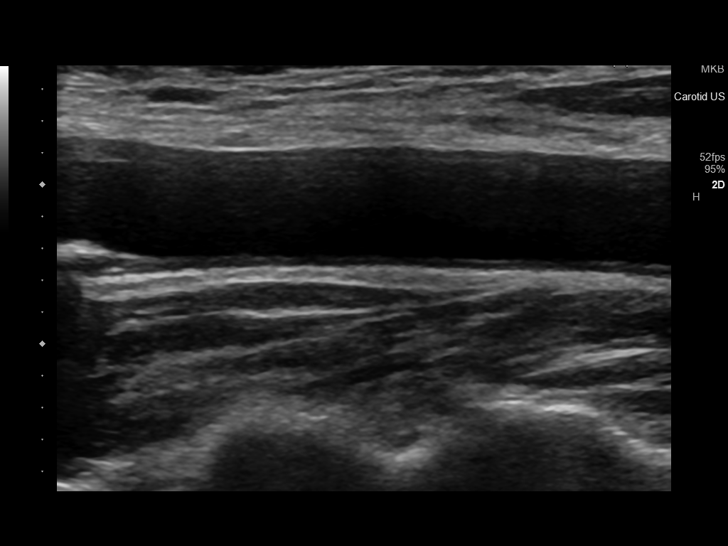
[im 47/64]
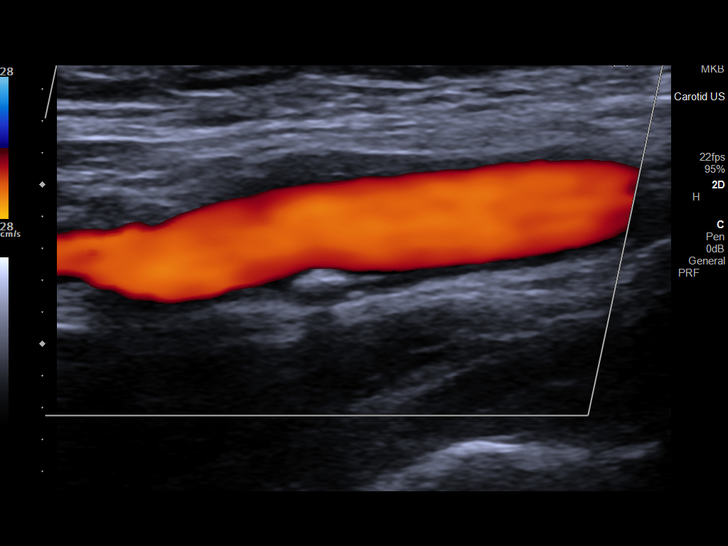
[im 53/64]
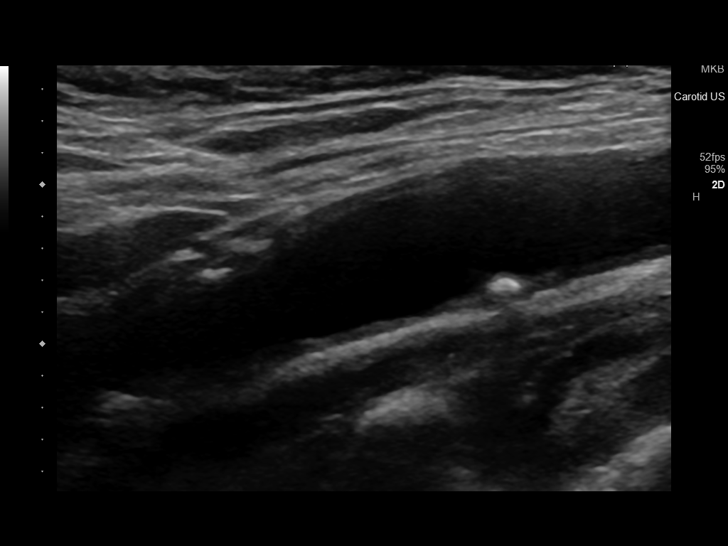
[im 58/64]
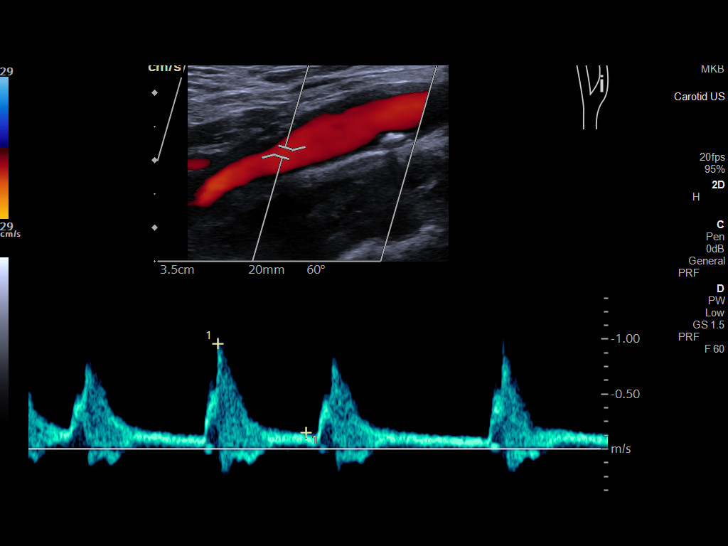
[im 64/64]
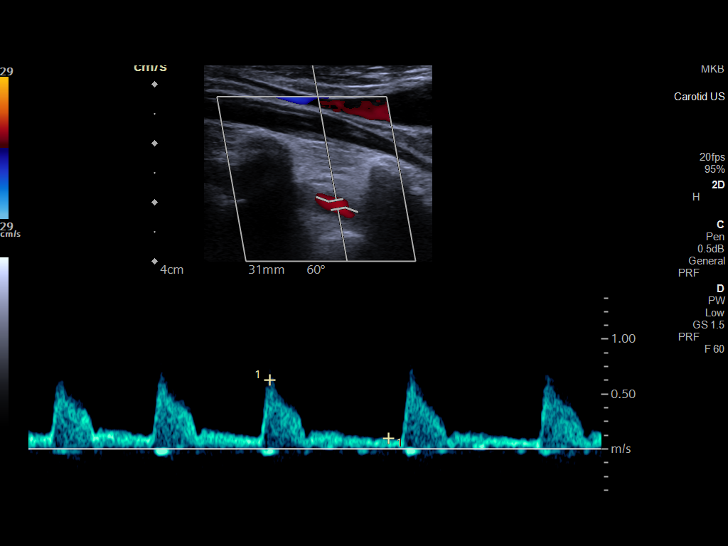

[13 of 24 positions shown; findings below may reference images not displayed]

FINDINGS: Criteria: Quantification of carotid stenosis is based on velocity
parameters that correlate the residual internal carotid diameter
with NASCET-based stenosis levels, using the diameter of the distal
internal carotid lumen as the denominator for stenosis measurement.

The following velocity measurements were obtained:

RIGHT
ICA: 117/35 cm/sec
CCA: 111/23 cm/sec

SYSTOLIC ICA/CCA RATIO:

ECA:  133 cm/sec

LEFT

ICA: 96/15 cm/sec

CCA: 117/21 cm/sec

SYSTOLIC ICA/CCA RATIO:

ECA:  133 cm/sec

RIGHT CAROTID ARTERY: Heterogeneous atherosclerotic plaque in the
proximal internal carotid artery. By peak systolic velocity
criteria, the estimated stenosis is less than 50%.

RIGHT VERTEBRAL ARTERY:  Patent with normal antegrade flow.

LEFT CAROTID ARTERY: Trace heterogeneous atherosclerotic plaque in
the proximal internal carotid artery. By peak systolic velocity
criteria, the estimated stenosis is less than 50%.

LEFT VERTEBRAL ARTERY:  Patent with normal antegrade flow.
IMPRESSION: Mild (1-49%) stenosis proximal right internal carotid artery
secondary to mild heterogeneous atherosclerotic plaque.

Mild (1-49%) stenosis proximal left internal carotid artery
secondary to trace heterogeneous atherosclerotic plaque.

Vertebral arteries are patent with normal antegrade flow.

## 2022-11-24 ENCOUNTER — Encounter: Payer: Self-pay | Admitting: Cardiology

## 2022-11-24 ENCOUNTER — Ambulatory Visit: Payer: Medicare Other | Attending: Cardiology | Admitting: Cardiology

## 2022-11-24 VITALS — BP 130/94 | HR 97 | Ht 74.0 in | Wt 159.2 lb

## 2022-11-24 DIAGNOSIS — I4821 Permanent atrial fibrillation: Secondary | ICD-10-CM | POA: Diagnosis not present

## 2022-11-24 DIAGNOSIS — E7800 Pure hypercholesterolemia, unspecified: Secondary | ICD-10-CM

## 2022-11-24 DIAGNOSIS — Z79899 Other long term (current) drug therapy: Secondary | ICD-10-CM | POA: Diagnosis not present

## 2022-11-24 DIAGNOSIS — I350 Nonrheumatic aortic (valve) stenosis: Secondary | ICD-10-CM | POA: Diagnosis not present

## 2022-11-24 DIAGNOSIS — I10 Essential (primary) hypertension: Secondary | ICD-10-CM | POA: Diagnosis not present

## 2022-11-24 DIAGNOSIS — I6523 Occlusion and stenosis of bilateral carotid arteries: Secondary | ICD-10-CM | POA: Diagnosis not present

## 2022-11-24 NOTE — Progress Notes (Signed)
Cardiology Office Note:    Date:  11/24/2022   ID:  Kyle Kelley, DOB [birth year], MRN [id number]  PCP:  Emilio Aspen, MD  Cardiologist:  Armanda Magic, MD    Referring MD: Marden Noble, MD   Chief Complaint  Patient presents with   Atrial Fibrillation   Hypertension   Aortic Stenosis   Hyperlipidemia    History of Present Illness:    Kyle Kelley is a 77 y.o. male with a hx of chronic atrial fibrillation on systemic anticoagulation and HTN. He has carotid artery stenosis with 1-49% by dopplers 09/2021.  He is here today for followup and is doing well.  He denies any chest pain or pressure, SOB, DOE, PND, orthopnea, LE edema, dizziness, palpitations or syncope. He is compliant with his meds and is tolerating meds with no SE.  He mows his yard and walks for exercise.   Past Medical History:  Diagnosis Date   Dyslipidemia    Hypertension    Inguinal hernia 2010   Small asymptomatic L inguinal Hernia   Permanent atrial fibrillation (HCC)    atrial fib    Pneumonia    hx of    Prostate cancer (HCC) 07/2012   s/p prostatectomy   PSA elevation    workup per Dr. Barron Alvine, March 2014   Sigmoid diverticulosis    on colonoscopy in 2010   Skin cancer 02/2012   Dr Raynelle Bring   Tobacco dependence    rarely smokes-3 cigs per week    Past Surgical History:  Procedure Laterality Date   CARDIOVERSION  08/05/2005   Direct Current Cardioversion   COLONOSCOPY  2010   Direct current Cardioversion  08/05/2005   PROSTATE BIOPSY  09/06/2012   Dr. Barron Alvine   ROBOT ASSISTED LAPAROSCOPIC RADICAL PROSTATECTOMY N/A 10/31/2012   Procedure: ROBOTIC ASSISTED LAPAROSCOPIC RADICAL PROSTATECTOMY;  Surgeon: Valetta Fuller, MD;  Location: [facility];  Service: Urology;  Laterality: N/A;   TONSILLECTOMY AND ADENOIDECTOMY     TONSILLECTOMY AND ADENOIDECTOMY      Current Medications: Current Meds  Medication Sig   Cholecalciferol (VITAMIN D3) 25 MCG (1000 UT) CAPS Take 1,000 Units by  mouth every other day.   diltiazem (TIAZAC) 300 MG 24 hr capsule Take 300 mg by mouth daily.   ELIQUIS 5 MG TABS tablet TAKE 1 TABLET BY MOUTH TWICE A DAY   EPINEPHrine (EPIPEN IJ) Inject 1 application as directed as needed.   loratadine (CLARITIN) 10 MG tablet Take 10 mg by mouth daily as needed for allergies.   metoprolol succinate (TOPROL-XL) 25 MG 24 hr tablet TAKE 1 TABLET BY MOUTH EVERY DAY   rosuvastatin (CRESTOR) 10 MG tablet Take 1 tablet (10 mg total) by mouth daily.     Allergies:   Ace inhibitors, Rythmol [propafenone], and Nitrofurantoin   Social History   Socioeconomic History   Marital status: Married    Spouse name: Not on file   Number of children: Not on file   Years of education: Not on file   Highest education level: Not on file  Occupational History   Not on file  Tobacco Use   Smoking status: Every Day    Packs/day: 0.50    Years: 50.00    Additional pack years: 0.00    Total pack years: 25.00    Types: Cigarettes   Smokeless tobacco: Never  Vaping Use   Vaping Use: Never used  Substance and Sexual Activity   Alcohol use: Yes  Comment: rare, heavier in the past   Drug use: No   Sexual activity: Never  Other Topics Concern   Not on file  Social History Narrative   Tobacco Use cigarettes: Current Smoker   Smoking: Yes, less than 1/2 pk per month   Alcohol: Yes - No Alcohol since June 2005   Caffeine: yes   No recreational drug use   Occupation: Employed   Marital Status: Married 38 years, wife has MS   Children: none   Social Determinants of Corporate investment banker Strain: Not on file  Food Insecurity: Not on file  Transportation Needs: Not on file  Physical Activity: Not on file  Stress: Not on file  Social Connections: Not on file     Family History: The patient's family history includes Cancer in his father; Heart disease in his father; Hyperlipidemia in his father; Hypertension in his mother.  ROS:   Please see the history of  present illness.    Review of Systems  Skin:  Negative for unusual hair distribution.    All other systems reviewed and negative.   EKGs/Labs/Other Studies Reviewed:    The following studies were reviewed today    Recent Labs: 02/01/2022: ALT 20   Recent Lipid Panel    Component Value Date/Time   CHOL 148 02/01/2022 1110   TRIG 65 02/01/2022 1110   HDL 55 02/01/2022 1110   CHOLHDL 2.7 02/01/2022 1110   CHOLHDL 4.0 09/03/2020 0451   VLDL 20 09/03/2020 0451   LDLCALC 80 02/01/2022 1110    Physical Exam:    VS:  BP (!) 130/94   Pulse 97   Ht 6\' 2"  (1.88 m)   Wt 159 lb 3.2 oz (72.2 kg)   SpO2 99%   BMI 20.44 kg/m     Wt Readings from Last 3 Encounters:  11/24/22 159 lb 3.2 oz (72.2 kg)  10/14/21 166 lb (75.3 kg)  10/05/20 173 lb 12.8 oz (78.8 kg)    GEN: Well nourished, well developed in no acute distress HEENT: Normal NECK: No JVD; No carotid bruits LYMPHATICS: No lymphadenopathy CARDIAC:irregularly irregular, no murmurs, rubs, gallops RESPIRATORY:  Clear to auscultation without rales, wheezing or rhonchi  ABDOMEN: Soft, non-tender, non-distended MUSCULOSKELETAL:  No edema; No deformity  SKIN: Warm and dry NEUROLOGIC:  Alert and oriented x 3 PSYCHIATRIC:  Normal affect    ASSESSMENT:    1. Permanent atrial fibrillation (HCC)   2. Essential hypertension, benign   3. Bilateral carotid artery stenosis   4. Nonrheumatic aortic valve stenosis   5. Pure hypercholesterolemia    PLAN:    In order of problems listed above:  1.  Permanent atrial fibrillation -he denies any palpitations and heart rate is well-controlled on exam today -He denies any bleeding problems on DOAC -Continue prescription drug management with Cardizem CD 300 mg daily, Eliquis 5 mg twice daily and Toprol-XL 25 mg daily with as needed refills -I have personally reviewed and interpreted outside labs performed by patient's PCP which showed serum creatinine 0.9, potassium 5.3 and hemoglobin  16.1 on 05/19/2022  2.  HTN -BP is borderline controlled on exam today but at home his DBP runs around 130/63mmHg -Continue prescription drug management with Toprol-XL 25 mg daily, diltiazem CD3 100 mg daily with as needed refills  3.  Carotid artery stenosis -1-39% bilateral stenosis by dopplers 10/16/2021 -No aspirin due to lack -continue statin -Repeat Dopplers  4.  Aortic stenosis -mild by echo 08/2020 -Repeat 2D echo for  reassessment  5.  HLD -LDL goal < 70 due to carotid stenosis -I have personally reviewed and interpreted outside labs performed by patient's PCP which showed LDL 80 and HDL 55 on 02/01/2022 -Continue prescription drug management with Crestor 10 mg daily with as needed refills -Repeat FLP and ALT   Medication Adjustments/Labs and Tests Ordered: Current medicines are reviewed at length with the patient today.  Concerns regarding medicines are outlined above.  Orders Placed This Encounter  Procedures   EKG 12-Lead    No orders of the defined types were placed in this encounter.   Signed, Armanda Magic, MD  11/24/2022 10:28 AM    [facility]

## 2022-11-24 NOTE — Addendum Note (Signed)
Addended by: Luellen Pucker on: 11/24/2022 10:37 AM   Modules accepted: Orders

## 2022-11-24 NOTE — Patient Instructions (Signed)
Medication Instructions:  Your physician recommends that you continue on your current medications as directed. Please refer to the Current Medication list given to you today.  *If you need a refill on your cardiac medications before your next appointment, please call your pharmacy*   Lab Work: Please complete a FASTING lipid panel and ALT today in our lab before you leave.  If you have labs (blood work) drawn today and your tests are completely normal, you will receive your results only by: MyChart Message (if you have MyChart) OR A paper copy in the mail If you have any lab test that is abnormal or we need to change your treatment, we will call you to review the results.   Testing/Procedures: Your physician has requested that you have an echocardiogram. Echocardiography is a painless test that uses sound waves to create images of your heart. It provides your doctor with information about the size and shape of your heart and how well your heart's chambers and valves are working. This procedure takes approximately one hour. There are no restrictions for this procedure. Please do NOT wear cologne, perfume, aftershave, or lotions (deodorant is allowed). Please arrive 15 minutes prior to your appointment time.  Your physician has requested that you have a carotid duplex. This test is an ultrasound of the carotid arteries in your neck. It looks at blood flow through these arteries that supply the brain with blood. Allow one hour for this exam. There are no restrictions or special instructions.    Follow-Up: At [facility], you and your health needs are our priority.  As part of our continuing mission to provide you with exceptional heart care, we have created designated Provider Care Teams.  These Care Teams include your primary Cardiologist (physician) and Advanced Practice Providers (APPs -  Physician Assistants and Nurse Practitioners) who all work together to provide you with the  care you need, when you need it.  We recommend signing up for the patient portal called "MyChart".  Sign up information is provided on this After Visit Summary.  MyChart is used to connect with patients for Virtual Visits (Telemedicine).  Patients are able to view lab/test results, encounter notes, upcoming appointments, etc.  Non-urgent messages can be sent to your provider as well.   To learn more about what you can do with MyChart, go to [email or web address].    Your next appointment:   1 year(s)  Provider:   Armanda Magic, MD

## 2022-11-25 LAB — LIPID PANEL
Chol/HDL Ratio: 2.6 ratio (ref 0.0–5.0)
Cholesterol, Total: 136 mg/dL (ref 100–199)
HDL: 53 mg/dL (ref >39)
LDL Chol Calc (NIH): 71 mg/dL (ref 0–99)
Triglycerides: 58 mg/dL (ref 0–149)
VLDL Cholesterol Cal: 12 mg/dL (ref 5–40)

## 2022-11-25 LAB — ALT: ALT: 24 IU/L (ref 0–44)

## 2022-12-14 ENCOUNTER — Ambulatory Visit (HOSPITAL_COMMUNITY)
Admission: RE | Admit: 2022-12-14 | Discharge: 2022-12-14 | Disposition: A | Discharge status: Home / Self Care | Payer: Medicare Other | Source: Ambulatory Visit | Attending: Cardiology | Admitting: Cardiology

## 2022-12-14 DIAGNOSIS — I6523 Occlusion and stenosis of bilateral carotid arteries: Secondary | ICD-10-CM | POA: Diagnosis not present

## 2022-12-15 ENCOUNTER — Encounter: Payer: Self-pay | Admitting: Cardiology

## 2022-12-15 DIAGNOSIS — I6529 Occlusion and stenosis of unspecified carotid artery: Secondary | ICD-10-CM | POA: Insufficient documentation

## 2022-12-16 ENCOUNTER — Telehealth: Payer: Self-pay

## 2022-12-16 DIAGNOSIS — I6523 Occlusion and stenosis of bilateral carotid arteries: Secondary | ICD-10-CM

## 2022-12-16 NOTE — Telephone Encounter (Signed)
-----   Message from Armanda Magic sent at 12/15/2022 11:05 AM EDT ----- 1-39% bilateral carotid artery stenosis - repeat dopplers in 1 year

## 2022-12-16 NOTE — Telephone Encounter (Signed)
Reviewed results of carotid scan, patient verbalizes understanding of 1-39% carotid artery stenosis. Order for repeat dopplers in 1 yr placed, patient agrees to plan.

## 2022-12-16 NOTE — Telephone Encounter (Signed)
Called to discuss results with patient, no answer. No numbers except [phone] have VM set up. Left message with no identifiers asking recipient to call our office.

## 2022-12-16 NOTE — Telephone Encounter (Signed)
Called to discuss resutls, No phone numbers listed seem to be working except [phone]. Left message with no identifiers asking recipient to call our office.

## 2022-12-22 ENCOUNTER — Encounter: Payer: Self-pay | Admitting: Cardiology

## 2022-12-22 ENCOUNTER — Telehealth: Payer: Self-pay

## 2022-12-22 ENCOUNTER — Ambulatory Visit (HOSPITAL_COMMUNITY): Payer: Medicare Other | Attending: Cardiovascular Disease

## 2022-12-22 DIAGNOSIS — I350 Nonrheumatic aortic (valve) stenosis: Secondary | ICD-10-CM | POA: Insufficient documentation

## 2022-12-22 LAB — ECHOCARDIOGRAM COMPLETE
AR max vel: 1.56 cm2
AV Area VTI: 1.56 cm2
AV Area mean vel: 1.51 cm2
AV Mean grad: 8.2 mmHg
AV Peak grad: 14.7 mmHg
Ao pk vel: 1.92 m/s
Area-P 1/2: 4.58 cm2
S' Lateral: 2.6 cm

## 2022-12-22 NOTE — Telephone Encounter (Signed)
-----   Message from Armanda Magic sent at 12/22/2022  2:22 PM EDT ----- Echo showed normal heart function with mildly elevated pressures in the vessels of the lungs, moderately enlarged right upper chamber of heart, calcified AV with mild to moderate AS (likely low flow/low gradient moderate AS).  Repeat echo in 1 year

## 2022-12-22 NOTE — Telephone Encounter (Signed)
The patient has been notified of the result and verbalized understanding.  All questions (if any) were answered. Frutoso Schatz, RN 12/22/2022 3:06 PM

## 2023-01-09 ENCOUNTER — Other Ambulatory Visit: Payer: Self-pay | Admitting: Cardiology

## 2023-03-18 DIAGNOSIS — Z23 Encounter for immunization: Secondary | ICD-10-CM | POA: Diagnosis not present

## 2023-04-14 DIAGNOSIS — Z8546 Personal history of malignant neoplasm of prostate: Secondary | ICD-10-CM | POA: Diagnosis not present

## 2023-04-21 DIAGNOSIS — Z8546 Personal history of malignant neoplasm of prostate: Secondary | ICD-10-CM | POA: Diagnosis not present

## 2023-05-09 ENCOUNTER — Other Ambulatory Visit: Payer: Self-pay | Admitting: Cardiology

## 2023-05-09 DIAGNOSIS — I4821 Permanent atrial fibrillation: Secondary | ICD-10-CM

## 2023-05-09 NOTE — Telephone Encounter (Addendum)
Eliquis 5mg  refill request received. Patient is 77 years old, weight-72.2kg, Crea-0.90 on 05/19/22 via [facility] at [place], [place], and last seen by Dr. Mayford Knife on 11/24/22. Dose is appropriate based on dosing criteria.   Called PCP office since labs will expire this month. Spoke with [facility] and asked if she could send the pt's latest labs.

## 2023-05-11 ENCOUNTER — Other Ambulatory Visit: Payer: Self-pay | Admitting: Cardiology

## 2023-06-06 DIAGNOSIS — Z91030 Bee allergy status: Secondary | ICD-10-CM | POA: Diagnosis not present

## 2023-06-06 DIAGNOSIS — I4891 Unspecified atrial fibrillation: Secondary | ICD-10-CM | POA: Diagnosis not present

## 2023-06-06 DIAGNOSIS — E782 Mixed hyperlipidemia: Secondary | ICD-10-CM | POA: Diagnosis not present

## 2023-06-06 DIAGNOSIS — Z8673 Personal history of transient ischemic attack (TIA), and cerebral infarction without residual deficits: Secondary | ICD-10-CM | POA: Diagnosis not present

## 2023-06-06 DIAGNOSIS — Z0000 Encounter for general adult medical examination without abnormal findings: Secondary | ICD-10-CM | POA: Diagnosis not present

## 2023-06-06 DIAGNOSIS — Z8546 Personal history of malignant neoplasm of prostate: Secondary | ICD-10-CM | POA: Diagnosis not present

## 2023-06-06 DIAGNOSIS — Z79899 Other long term (current) drug therapy: Secondary | ICD-10-CM | POA: Diagnosis not present

## 2023-06-06 DIAGNOSIS — E559 Vitamin D deficiency, unspecified: Secondary | ICD-10-CM | POA: Diagnosis not present

## 2023-06-06 DIAGNOSIS — I10 Essential (primary) hypertension: Secondary | ICD-10-CM | POA: Diagnosis not present

## 2023-06-06 DIAGNOSIS — M40209 Unspecified kyphosis, site unspecified: Secondary | ICD-10-CM | POA: Diagnosis not present

## 2023-06-06 DIAGNOSIS — Z1331 Encounter for screening for depression: Secondary | ICD-10-CM | POA: Diagnosis not present

## 2023-09-13 DIAGNOSIS — D225 Melanocytic nevi of trunk: Secondary | ICD-10-CM | POA: Diagnosis not present

## 2023-09-13 DIAGNOSIS — L820 Inflamed seborrheic keratosis: Secondary | ICD-10-CM | POA: Diagnosis not present

## 2023-09-13 DIAGNOSIS — Z85828 Personal history of other malignant neoplasm of skin: Secondary | ICD-10-CM | POA: Diagnosis not present

## 2023-09-13 DIAGNOSIS — L570 Actinic keratosis: Secondary | ICD-10-CM | POA: Diagnosis not present

## 2023-09-13 DIAGNOSIS — D692 Other nonthrombocytopenic purpura: Secondary | ICD-10-CM | POA: Diagnosis not present

## 2023-09-13 DIAGNOSIS — L821 Other seborrheic keratosis: Secondary | ICD-10-CM | POA: Diagnosis not present

## 2023-09-13 DIAGNOSIS — L853 Xerosis cutis: Secondary | ICD-10-CM | POA: Diagnosis not present

## 2023-09-13 DIAGNOSIS — D2271 Melanocytic nevi of right lower limb, including hip: Secondary | ICD-10-CM | POA: Diagnosis not present

## 2023-09-13 DIAGNOSIS — L812 Freckles: Secondary | ICD-10-CM | POA: Diagnosis not present

## 2023-09-13 DIAGNOSIS — D2272 Melanocytic nevi of left lower limb, including hip: Secondary | ICD-10-CM | POA: Diagnosis not present

## 2023-11-13 ENCOUNTER — Telehealth: Payer: Self-pay

## 2023-11-13 DIAGNOSIS — I350 Nonrheumatic aortic (valve) stenosis: Secondary | ICD-10-CM

## 2023-11-13 NOTE — Telephone Encounter (Signed)
 Echo order replaced, previous order expired.

## 2023-11-13 NOTE — Telephone Encounter (Signed)
-----   Message from Elinda Guard sent at 11/13/2023 10:30 AM EDT ----- Please update echo order due to it expired before scheduled, Thank you!

## 2023-11-16 ENCOUNTER — Other Ambulatory Visit: Payer: Self-pay

## 2023-11-16 DIAGNOSIS — I4821 Permanent atrial fibrillation: Secondary | ICD-10-CM

## 2023-11-16 MED ORDER — APIXABAN 5 MG PO TABS: 5.0000 mg | ORAL_TABLET | Freq: Two times a day (BID) | ORAL | 0 refills | Status: DC

## 2023-11-16 NOTE — Telephone Encounter (Signed)
 Prescription refill request for Eliquis  received. Indication: Afib  Last office visit: 11/24/22)  Scr: 0.93 (06/06/23 via [facility]where)  Age: 78 Weight: 72.2kg  Pt has scheduled appt with Dr Micael Adas on 12/06/23.  Appropriate dose. Refill sent.

## 2023-12-05 NOTE — Progress Notes (Unsigned)
 Cardiology Office Note:    Date:  12/06/2023   ID:  Kyle Kelley, DOB [birth year], MRN [id number]  PCP:  Charlott Dorn LABOR, MD  Cardiologist:  Wilbert Bihari, MD    Referring MD: Charlott Dorn LABOR, *   Chief Complaint  Patient presents with   Atrial Fibrillation   Hypertension   Aortic Stenosis   Hyperlipidemia    History of Present Illness:    Kyle Kelley is a 78 y.o. male with a hx of chronic atrial fibrillation on systemic anticoagulation and HTN. He has carotid artery stenosis with 1-49% by dopplers 09/2021. Hwe is here today for followup and is doing well.  He denies any chest pain or pressure, SOB, DOE, PND, orthopnea, LE edema, dizziness, palpitations or syncope. Heh is compliant with his meds and is tolerating meds with no SE.    Past Medical History:  Diagnosis Date   Aortic stenosis    mild to moderate by echo 11/2022   Carotid artery stenosis    1-39% bilaterally by dopplers 11/2022   Dyslipidemia    Hypertension    Inguinal hernia 2010   Small asymptomatic L inguinal Hernia   Permanent atrial fibrillation (HCC)    atrial fib    Pneumonia    hx of    Prostate cancer (HCC) 07/2012   s/p prostatectomy   PSA elevation    workup per Dr. Alm Fragmin, March 2014   Sigmoid diverticulosis    on colonoscopy in 2010   Skin cancer 02/2012   Dr Renay Homer   Tobacco dependence    rarely smokes-3 cigs per week    Past Surgical History:  Procedure Laterality Date   CARDIOVERSION  08/05/2005   Direct Current Cardioversion   COLONOSCOPY  2010   Direct current Cardioversion  08/05/2005   PROSTATE BIOPSY  09/06/2012   Dr. Alm Fragmin   ROBOT ASSISTED LAPAROSCOPIC RADICAL PROSTATECTOMY N/A 10/31/2012   Procedure: ROBOTIC ASSISTED LAPAROSCOPIC RADICAL PROSTATECTOMY;  Surgeon: Alm GORMAN Fragmin, MD;  Location: [facility];  Service: Urology;  Laterality: N/A;   TONSILLECTOMY AND ADENOIDECTOMY     TONSILLECTOMY AND ADENOIDECTOMY      Current Medications: No outpatient  medications have been marked as taking for the 12/06/23 encounter (Office Visit) with Bihari Wilbert SAUNDERS, MD.     Allergies:   Ace inhibitors, Rythmol [propafenone], and Nitrofurantoin   Social History   Socioeconomic History   Marital status: Married    Spouse name: Not on file   Number of children: Not on file   Years of education: Not on file   Highest education level: Not on file  Occupational History   Not on file  Tobacco Use   Smoking status: Every Day    Current packs/day: 0.50    Average packs/day: 0.5 packs/day for 50.0 years (25.0 ttl pk-yrs)    Types: Cigarettes   Smokeless tobacco: Never  Vaping Use   Vaping status: Never Used  Substance and Sexual Activity   Alcohol use: Yes    Comment: rare, heavier in the past   Drug use: No   Sexual activity: Never  Other Topics Concern   Not on file  Social History Narrative   Tobacco Use cigarettes: Current Smoker   Smoking: Yes, less than 1/2 pk per month   Alcohol: Yes - No Alcohol since June 2005   Caffeine: yes   No recreational drug use   Occupation: Employed   Marital Status: Married 38 years, wife has MS  Children: none   Social Drivers of Corporate investment banker Strain: Not on file  Food Insecurity: Not on file  Transportation Needs: Not on file  Physical Activity: Not on file  Stress: Not on file  Social Connections: Not on file     Family History: The patient's family history includes Cancer in his father; Heart disease in his father; Hyperlipidemia in his father; Hypertension in his mother.  ROS:   Please see the history of present illness.    Review of Systems  Skin:  Negative for unusual hair distribution.    All other systems reviewed and negative.   EKGs/Labs/Other Studies Reviewed:    The following studies were reviewed today   EKG Interpretation Date/Time:  Wednesday December 06 2023 09:03:09 EDT Ventricular Rate:  86 PR Interval:    QRS Duration:  82 QT Interval:  360 QTC  Calculation: 430 R Axis:   -35  Text Interpretation: Atrial fibrillation Left axis deviation Septal infarct , age undetermined When compared with ECG of 02-Sep-2020 14:29, PREVIOUS ECG IS PRESENT No significant change since last tracing Confirmed by Shlomo Corning ([id number]) on 12/06/2023 9:19:13 AM    Recent Labs: No results found for requested labs within last 365 days.   Recent Lipid Panel    Component Value Date/Time   CHOL 136 11/24/2022 1051   TRIG 58 11/24/2022 1051   HDL 53 11/24/2022 1051   CHOLHDL 2.6 11/24/2022 1051   CHOLHDL 4.0 09/03/2020 0451   VLDL 20 09/03/2020 0451   LDLCALC 71 11/24/2022 1051    Physical Exam:    VS:  BP (!) 142/72   Pulse 86   Ht 6' 1 (1.854 m)   Wt 160 lb 6.4 oz (72.8 kg)   SpO2 98%   BMI 21.16 kg/m     Wt Readings from Last 3 Encounters:  12/06/23 160 lb 6.4 oz (72.8 kg)  11/24/22 159 lb 3.2 oz (72.2 kg)  10/14/21 166 lb (75.3 kg)    GEN: Well nourished, well developed in no acute distress HEENT: Normal NECK: No JVD; No carotid bruits LYMPHATICS: No lymphadenopathy CARDIAC:irregularly irregular, no murmurs, rubs, gallops RESPIRATORY:  Clear to auscultation without rales, wheezing or rhonchi  ABDOMEN: Soft, non-tender, non-distended MUSCULOSKELETAL:  No edema; No deformity  SKIN: Warm and dry NEUROLOGIC:  Alert and oriented x 3 PSYCHIATRIC:  Normal affect  ASSESSMENT:    1. Permanent atrial fibrillation (HCC)   2. Essential hypertension, benign   3. Bilateral carotid artery stenosis   4. Nonrheumatic aortic valve stenosis   5. Pure hypercholesterolemia     PLAN:    In order of problems listed above:  1.  Permanent atrial fibrillation - He remains in atrial fibrillation with good heart rate control - He has not had any palpitations and no bleeding problems on DOAC - Continue apixaban  5 mg twice daily, Tiazac  300 mg daily and Toprol -XL 25 mg daily with as needed refills - I have personally reviewed and interpreted outside  labs performed by patient's PCP which showed Hbg 16.7 on 06/06/2023  2.  HTN - His BP is adequately controlled on exam today - Continue Tiazac  300 mg daily and Toprol -XL 25 mg daily with as needed refills  3.  Carotid artery stenosis - 1-39% bilateral stenosis by dopplers 12/17/2022 - No aspirin  due to DOAC - Continue Crestor  10 mg daily - Repeat Dopplers  4.  Aortic stenosis -mild by echo 08/2020 -Repeat 2D echo 12/17/2022 with paradoxical low-flow low gradient mild  to moderate aortic valve stenosis mean aortic valve gradient 8.2 mmHg, DVI 0.38, V-max 1.40m/s, SVI 30 -Repeat 2D echo to reassess AS  5.  HLD -LDL goal < 70 due to carotid stenosis -I have personally reviewed and interpreted outside labs performed by patient's PCP which showed LDL 81, HDL 50 ALT 31 on 06/06/2023 -he does not want to increase statin or add on Zetia due to leg cramps -Continue Crestor  10 mg daily with as needed refills  Medication Adjustments/Labs and Tests Ordered: Current medicines are reviewed at length with the patient today.  Concerns regarding medicines are outlined above.  Orders Placed This Encounter  Procedures   EKG 12-Lead    No orders of the defined types were placed in this encounter.   Signed, Wilbert Bihari, MD  12/06/2023 9:17 AM    [facility]

## 2023-12-06 ENCOUNTER — Encounter: Payer: Self-pay | Admitting: Cardiology

## 2023-12-06 ENCOUNTER — Ambulatory Visit: Attending: Cardiology | Admitting: Cardiology

## 2023-12-06 VITALS — BP 142/72 | HR 86 | Ht 73.0 in | Wt 160.4 lb

## 2023-12-06 DIAGNOSIS — I6523 Occlusion and stenosis of bilateral carotid arteries: Secondary | ICD-10-CM | POA: Diagnosis not present

## 2023-12-06 DIAGNOSIS — I10 Essential (primary) hypertension: Secondary | ICD-10-CM | POA: Insufficient documentation

## 2023-12-06 DIAGNOSIS — I4821 Permanent atrial fibrillation: Secondary | ICD-10-CM | POA: Insufficient documentation

## 2023-12-06 DIAGNOSIS — E7800 Pure hypercholesterolemia, unspecified: Secondary | ICD-10-CM | POA: Insufficient documentation

## 2023-12-06 DIAGNOSIS — Z79899 Other long term (current) drug therapy: Secondary | ICD-10-CM | POA: Insufficient documentation

## 2023-12-06 DIAGNOSIS — I350 Nonrheumatic aortic (valve) stenosis: Secondary | ICD-10-CM | POA: Diagnosis not present

## 2023-12-06 NOTE — Patient Instructions (Signed)
 Medication Instructions:  Your physician recommends that you continue on your current medications as directed. Please refer to the Current Medication list given to you today.  *If you need a refill on your cardiac medications before your next appointment, please call your pharmacy*  Lab Work: Please complete a FASTING lipid panel and an ALT in 4 months at any [facility].   If you have labs (blood work) drawn today and your tests are completely normal, you will receive your results only by: MyChart Message (if you have MyChart) OR A paper copy in the mail If you have any lab test that is abnormal or we need to change your treatment, we will call you to review the results.  Testing/Procedures: None.  Follow-Up: At [facility], you and your health needs are our priority.  As part of our continuing mission to provide you with exceptional heart care, our providers are all part of one team.  This team includes your primary Cardiologist (physician) and Advanced Practice Providers or APPs (Physician Assistants and Nurse Practitioners) who all work together to provide you with the care you need, when you need it.  Your next appointment:   1 year(s)  Provider:   Wilbert Bihari, MD

## 2023-12-06 NOTE — Addendum Note (Signed)
 Addended by: JANIT GENI CROME on: 12/06/2023 09:28 AM   Modules accepted: Orders

## 2023-12-14 ENCOUNTER — Ambulatory Visit (HOSPITAL_COMMUNITY)
Admission: RE | Admit: 2023-12-14 | Discharge: 2023-12-14 | Disposition: A | Discharge status: Home / Self Care | Source: Ambulatory Visit | Attending: Cardiology | Admitting: Cardiology

## 2023-12-14 DIAGNOSIS — I6523 Occlusion and stenosis of bilateral carotid arteries: Secondary | ICD-10-CM | POA: Insufficient documentation

## 2023-12-17 ENCOUNTER — Ambulatory Visit: Payer: Self-pay | Admitting: Cardiology

## 2023-12-25 ENCOUNTER — Other Ambulatory Visit: Payer: Self-pay

## 2023-12-25 MED ORDER — ROSUVASTATIN CALCIUM 10 MG PO TABS: 10.0000 mg | ORAL_TABLET | Freq: Every day | ORAL | 3 refills | Status: DC

## 2023-12-25 MED ORDER — METOPROLOL SUCCINATE ER 25 MG PO TB24: 25.0000 mg | ORAL_TABLET | Freq: Every day | ORAL | 3 refills | Status: DC

## 2023-12-26 ENCOUNTER — Ambulatory Visit (HOSPITAL_COMMUNITY)
Admission: RE | Admit: 2023-12-26 | Discharge: 2023-12-26 | Disposition: A | Discharge status: Home / Self Care | Source: Ambulatory Visit | Attending: Internal Medicine | Admitting: Internal Medicine

## 2023-12-26 ENCOUNTER — Encounter: Payer: Self-pay | Admitting: Cardiology

## 2023-12-26 ENCOUNTER — Ambulatory Visit: Payer: Self-pay | Admitting: Cardiology

## 2023-12-26 DIAGNOSIS — I350 Nonrheumatic aortic (valve) stenosis: Secondary | ICD-10-CM | POA: Diagnosis not present

## 2023-12-26 LAB — ECHOCARDIOGRAM COMPLETE
AR max vel: 1.23 cm2
AV Area VTI: 1.02 cm2
AV Area mean vel: 1.17 cm2
AV Mean grad: 10.8 mmHg
AV Peak grad: 19.4 mmHg
Ao pk vel: 2.2 m/s
Area-P 1/2: 3.78 cm2
MV M vel: 4.84 m/s
MV Peak grad: 93.7 mmHg
S' Lateral: 2.7 cm

## 2023-12-27 NOTE — Telephone Encounter (Signed)
-----   Message from Wilbert Bihari sent at 12/26/2023  9:50 PM EDT ----- Echo showed normal heart function with EF 55 to 60% with mildly enlarged right ventricle, enlarged left atria, moderate to severe leakiness of the tricuspid valve,  moderate aortic stenosis with mean  aortic valve gradient 10.8 mmHg, V-max 2.22M/S, SVI 28, DI 0.3>> repeat echo in 1 year ----- Message ----- From: Interface, [employer] Seven Sent: 12/26/2023   4:36 PM EDT To: Wilbert JONELLE Bihari, MD

## 2023-12-27 NOTE — Telephone Encounter (Signed)
 Call to patient to discuss that Echo showed normal heart function with EF 55 to 60% with mildly enlarged right ventricle, enlarged left atria, moderate to severe leakiness of the tricuspid valve, moderate aortic stenosis with mean aortic valve gradient 10.8 mmHg, V-max 2.15M/S, SVI 28, DI 0.3. Patient agrees to repeat echo in 1 year.

## 2024-02-11 ENCOUNTER — Other Ambulatory Visit: Payer: Self-pay | Admitting: Cardiology

## 2024-02-11 DIAGNOSIS — I4821 Permanent atrial fibrillation: Secondary | ICD-10-CM

## 2024-02-12 NOTE — Telephone Encounter (Signed)
 Prescription refill request for Eliquis  received. Indication: a fib Last office visit: 12/06/23 Scr: 0.93 care everywhere 06/06/23 Age: 78 Weight: 72kg

## 2024-02-27 ENCOUNTER — Encounter (HOSPITAL_COMMUNITY): Payer: Self-pay

## 2024-02-27 ENCOUNTER — Emergency Department (HOSPITAL_COMMUNITY)

## 2024-02-27 ENCOUNTER — Inpatient Hospital Stay (HOSPITAL_COMMUNITY)
Admission: EM | Admit: 2024-02-27 | Discharge: 2024-02-29 | DRG: 065 | Disposition: A | Discharge status: Home / Self Care | Attending: Neurology | Admitting: Neurology

## 2024-02-27 DIAGNOSIS — E785 Hyperlipidemia, unspecified: Secondary | ICD-10-CM | POA: Diagnosis present

## 2024-02-27 DIAGNOSIS — R29701 NIHSS score 1: Secondary | ICD-10-CM | POA: Diagnosis not present

## 2024-02-27 DIAGNOSIS — Z85828 Personal history of other malignant neoplasm of skin: Secondary | ICD-10-CM

## 2024-02-27 DIAGNOSIS — I613 Nontraumatic intracerebral hemorrhage in brain stem: Principal | ICD-10-CM | POA: Diagnosis present

## 2024-02-27 DIAGNOSIS — Z8546 Personal history of malignant neoplasm of prostate: Secondary | ICD-10-CM

## 2024-02-27 DIAGNOSIS — I10 Essential (primary) hypertension: Secondary | ICD-10-CM | POA: Diagnosis not present

## 2024-02-27 DIAGNOSIS — Z83438 Family history of other disorder of lipoprotein metabolism and other lipidemia: Secondary | ICD-10-CM | POA: Diagnosis not present

## 2024-02-27 DIAGNOSIS — T45515A Adverse effect of anticoagulants, initial encounter: Secondary | ICD-10-CM | POA: Diagnosis not present

## 2024-02-27 DIAGNOSIS — F17210 Nicotine dependence, cigarettes, uncomplicated: Secondary | ICD-10-CM | POA: Diagnosis present

## 2024-02-27 DIAGNOSIS — R42 Dizziness and giddiness: Principal | ICD-10-CM

## 2024-02-27 DIAGNOSIS — S06310A Contusion and laceration of right cerebrum without loss of consciousness, initial encounter: Secondary | ICD-10-CM

## 2024-02-27 DIAGNOSIS — I4891 Unspecified atrial fibrillation: Secondary | ICD-10-CM | POA: Diagnosis not present

## 2024-02-27 DIAGNOSIS — Z9079 Acquired absence of other genital organ(s): Secondary | ICD-10-CM

## 2024-02-27 DIAGNOSIS — Z79899 Other long term (current) drug therapy: Secondary | ICD-10-CM

## 2024-02-27 DIAGNOSIS — I2510 Atherosclerotic heart disease of native coronary artery without angina pectoris: Secondary | ICD-10-CM | POA: Diagnosis present

## 2024-02-27 DIAGNOSIS — E871 Hypo-osmolality and hyponatremia: Secondary | ICD-10-CM | POA: Diagnosis present

## 2024-02-27 DIAGNOSIS — I6521 Occlusion and stenosis of right carotid artery: Secondary | ICD-10-CM | POA: Diagnosis present

## 2024-02-27 DIAGNOSIS — Z923 Personal history of irradiation: Secondary | ICD-10-CM

## 2024-02-27 DIAGNOSIS — I350 Nonrheumatic aortic (valve) stenosis: Secondary | ICD-10-CM | POA: Diagnosis present

## 2024-02-27 DIAGNOSIS — J439 Emphysema, unspecified: Secondary | ICD-10-CM | POA: Diagnosis not present

## 2024-02-27 DIAGNOSIS — I700 Atherosclerosis of aorta: Secondary | ICD-10-CM | POA: Diagnosis not present

## 2024-02-27 DIAGNOSIS — S06340A Traumatic hemorrhage of right cerebrum without loss of consciousness, initial encounter: Secondary | ICD-10-CM | POA: Diagnosis not present

## 2024-02-27 DIAGNOSIS — I619 Nontraumatic intracerebral hemorrhage, unspecified: Secondary | ICD-10-CM | POA: Diagnosis present

## 2024-02-27 DIAGNOSIS — I482 Chronic atrial fibrillation, unspecified: Secondary | ICD-10-CM | POA: Diagnosis not present

## 2024-02-27 DIAGNOSIS — R270 Ataxia, unspecified: Secondary | ICD-10-CM | POA: Diagnosis present

## 2024-02-27 DIAGNOSIS — I4821 Permanent atrial fibrillation: Secondary | ICD-10-CM | POA: Diagnosis present

## 2024-02-27 DIAGNOSIS — R29702 NIHSS score 2: Secondary | ICD-10-CM | POA: Diagnosis present

## 2024-02-27 DIAGNOSIS — E7800 Pure hypercholesterolemia, unspecified: Secondary | ICD-10-CM | POA: Diagnosis not present

## 2024-02-27 DIAGNOSIS — Z888 Allergy status to other drugs, medicaments and biological substances status: Secondary | ICD-10-CM

## 2024-02-27 DIAGNOSIS — I6523 Occlusion and stenosis of bilateral carotid arteries: Secondary | ICD-10-CM | POA: Diagnosis not present

## 2024-02-27 DIAGNOSIS — G319 Degenerative disease of nervous system, unspecified: Secondary | ICD-10-CM | POA: Diagnosis not present

## 2024-02-27 DIAGNOSIS — Z8673 Personal history of transient ischemic attack (TIA), and cerebral infarction without residual deficits: Secondary | ICD-10-CM

## 2024-02-27 DIAGNOSIS — R059 Cough, unspecified: Secondary | ICD-10-CM | POA: Diagnosis not present

## 2024-02-27 DIAGNOSIS — R55 Syncope and collapse: Secondary | ICD-10-CM | POA: Diagnosis not present

## 2024-02-27 DIAGNOSIS — D6832 Hemorrhagic disorder due to extrinsic circulating anticoagulants: Secondary | ICD-10-CM | POA: Diagnosis present

## 2024-02-27 DIAGNOSIS — Z7901 Long term (current) use of anticoagulants: Secondary | ICD-10-CM | POA: Diagnosis not present

## 2024-02-27 DIAGNOSIS — R29818 Other symptoms and signs involving the nervous system: Secondary | ICD-10-CM | POA: Diagnosis not present

## 2024-02-27 DIAGNOSIS — Z720 Tobacco use: Secondary | ICD-10-CM | POA: Diagnosis present

## 2024-02-27 DIAGNOSIS — I6782 Cerebral ischemia: Secondary | ICD-10-CM | POA: Diagnosis not present

## 2024-02-27 DIAGNOSIS — E119 Type 2 diabetes mellitus without complications: Secondary | ICD-10-CM | POA: Diagnosis present

## 2024-02-27 DIAGNOSIS — Z8249 Family history of ischemic heart disease and other diseases of the circulatory system: Secondary | ICD-10-CM

## 2024-02-27 DIAGNOSIS — G936 Cerebral edema: Secondary | ICD-10-CM | POA: Diagnosis not present

## 2024-02-27 LAB — I-STAT CHEM 8, ED
BUN: 18 mg/dL (ref 8–23)
Calcium, Ion: 1.18 mmol/L (ref 1.15–1.40)
Chloride: 95 mmol/L — ABNORMAL LOW (ref 98–111)
Creatinine, Ser: 0.7 mg/dL (ref 0.61–1.24)
Glucose, Bld: 144 mg/dL — ABNORMAL HIGH (ref 70–99)
HCT: 51 % (ref 39.0–52.0)
Hemoglobin: 17.3 g/dL — ABNORMAL HIGH (ref 13.0–17.0)
Potassium: 3.9 mmol/L (ref 3.5–5.1)
Sodium: 132 mmol/L — ABNORMAL LOW (ref 135–145)
TCO2: 24 mmol/L (ref 22–32)

## 2024-02-27 LAB — DIFFERENTIAL
Abs Immature Granulocytes: 0.03 K/uL (ref 0.00–0.07)
Basophils Absolute: 0 K/uL (ref 0.0–0.1)
Basophils Relative: 0 %
Eosinophils Absolute: 0 K/uL (ref 0.0–0.5)
Eosinophils Relative: 0 %
Immature Granulocytes: 0 %
Lymphocytes Relative: 10 %
Lymphs Abs: 0.7 K/uL (ref 0.7–4.0)
Monocytes Absolute: 0.7 K/uL (ref 0.1–1.0)
Monocytes Relative: 10 %
Neutro Abs: 5.7 K/uL (ref 1.7–7.7)
Neutrophils Relative %: 80 %

## 2024-02-27 LAB — CBC
HCT: 48.2 % (ref 39.0–52.0)
Hemoglobin: 16.2 g/dL (ref 13.0–17.0)
MCH: 30.8 pg (ref 26.0–34.0)
MCHC: 33.6 g/dL (ref 30.0–36.0)
MCV: 91.6 fL (ref 80.0–100.0)
Platelets: 212 K/uL (ref 150–400)
RBC: 5.26 MIL/uL (ref 4.22–5.81)
RDW: 12.2 % (ref 11.5–15.5)
WBC: 7.2 K/uL (ref 4.0–10.5)
nRBC: 0 % (ref 0.0–0.2)

## 2024-02-27 LAB — COMPREHENSIVE METABOLIC PANEL WITH GFR
ALT: 27 U/L (ref 0–44)
AST: 32 U/L (ref 15–41)
Albumin: 3.5 g/dL (ref 3.5–5.0)
Alkaline Phosphatase: 61 U/L (ref 38–126)
Anion gap: 13 (ref 5–15)
BUN: 15 mg/dL (ref 8–23)
CO2: 20 mmol/L — ABNORMAL LOW (ref 22–32)
Calcium: 8.5 mg/dL — ABNORMAL LOW (ref 8.9–10.3)
Chloride: 95 mmol/L — ABNORMAL LOW (ref 98–111)
Creatinine, Ser: 0.77 mg/dL (ref 0.61–1.24)
GFR, Estimated: >60 mL/min (ref >60)
Glucose, Bld: 140 mg/dL — ABNORMAL HIGH (ref 70–99)
Potassium: 4 mmol/L (ref 3.5–5.1)
Sodium: 128 mmol/L — ABNORMAL LOW (ref 135–145)
Total Bilirubin: 0.9 mg/dL (ref 0.0–1.2)
Total Protein: 7.7 g/dL (ref 6.5–8.1)

## 2024-02-27 LAB — URINALYSIS, ROUTINE W REFLEX MICROSCOPIC
Bilirubin Urine: NEGATIVE
Glucose, UA: NEGATIVE mg/dL
Hgb urine dipstick: NEGATIVE
Ketones, ur: 5 mg/dL — AB
Leukocytes,Ua: NEGATIVE
Nitrite: NEGATIVE
Protein, ur: NEGATIVE mg/dL
Specific Gravity, Urine: 1.023 (ref 1.005–1.030)
pH: 5 (ref 5.0–8.0)

## 2024-02-27 LAB — CBG MONITORING, ED: Glucose-Capillary: 145 mg/dL — ABNORMAL HIGH (ref 70–99)

## 2024-02-27 LAB — ETHANOL: Alcohol, Ethyl (B): <15 mg/dL (ref <15)

## 2024-02-27 LAB — PROTIME-INR
INR: 1.2 (ref 0.8–1.2)
Prothrombin Time: 15.5 s — ABNORMAL HIGH (ref 11.4–15.2)

## 2024-02-27 LAB — TROPONIN I (HIGH SENSITIVITY): Troponin I (High Sensitivity): 5 ng/L (ref <18)

## 2024-02-27 LAB — APTT: aPTT: 34 s (ref 24–36)

## 2024-02-27 MED ORDER — SODIUM CHLORIDE 0.9% FLUSH: 3.0000 mL | Freq: Once | INTRAVENOUS | Status: DC

## 2024-02-27 MED ORDER — MECLIZINE HCL 25 MG PO TABS
25.0000 mg | ORAL_TABLET | Freq: Once | ORAL | Status: AC
  Administered 2024-02-27: 25 mg via ORAL
  Filled 2024-02-27: qty 1

## 2024-02-27 NOTE — ED Provider Triage Note (Signed)
 Emergency Medicine Provider Triage Evaluation Note  Kyle Kelley , a 78 y.o. male  was evaluated in triage.  Pt complains of dizzy. Pt report for the past week he has had sinus congestion and chest congestion with productive cough.  No fever, chills, body aches, n/v/d, dysuria.  Today he also report feeling lightheadedness with positional changes and felt unsteady on feet, more left leaning.  Seen at [facility] today, had negative viral panel and negative CXR.  Sent here to be evaluate further for dizziness.  Denies focal numbness, focal weakness, or headache, or confusion  Review of Systems  Positive: As above Negative: As above  Physical Exam  BP (!) 160/93 (BP Location: Right Arm)   Pulse 62   Temp 98.5 F (36.9 C) (Oral)   Resp 16   SpO2 99%  Gen:   Awake, no distress   Resp:  Normal effort  MSK:   Moves extremities without difficulty  Other:  No focal neuro deficit. Able to ambulate unassisted  Medical Decision Making  Medically screening exam initiated at 9:38 PM.  Appropriate orders placed.  Kyle Kelley was informed that the remainder of the evaluation will be completed by another provider, this initial triage assessment does not replace that evaluation, and the importance of remaining in the ED until their evaluation is complete.  Not a code stroke.     Nivia Colon, PA-C 02/27/24 2140

## 2024-02-27 NOTE — ED Provider Notes (Incomplete)
 [facility] Provider Note   CSN: [id number] Arrival date & time: 02/27/24  2107     Patient presents with: Dizziness   Kyle Kelley is a 78 y.o. male with history of permanent atrial fibrillation on anticoagulation, hypertension, hypercholesterolemia, dizziness, tobacco abuse, parotid mass, aortic stenosis, carotid artery stenosis.  Patient presents to ED for evaluation of dizziness.  States that he has been dizzy since 12 noon this afternoon.  Reports that he was going to the bathroom to wash hands for lunch when he noticed that he was dizzy.  States each is gone from a sitting to a standing position.  Reports that when he sits and goes to stand he becomes dizzy described as lightheaded.  Reports that this will worsen when he ambulates and he leans to the left.  He denies any dizziness at rest.  When he is laying in the bed he has no dizziness.  He denies headache, neck pain, visual disturbances, one-sided numbness or weakness.  He reports he has been dealing with cough, congestion which he attributes to a URI for the last 1 week.  He states he was seen at urgent care today and had negative viral panel and chest x-ray.  He was sent to the ED out of further concern of dizziness.  He denies any chest pain or shortness of breath today.  He denies any leg swelling.  Denies any abdominal pain, nausea or vomiting.  Reports he remains dizzy on standing.  States he had similar presentation in 2022 where he presented to [facility] and had stroke workup which was unremarkable.  Patient was admitted and ultimately discharged.  He denies that he has had any recurrence of symptoms since 2022.  He reports that he was told by a friend who does not work in the medical field that it could be a result of him taking metoprolol  and another medication together.  He reports that he has taken these 2 medications together since 2014 with 1 episode of dizziness.   Dizziness       Prior to Admission medications   Medication Sig Start Date End Date Taking? Authorizing Provider  apixaban  (ELIQUIS ) 5 MG TABS tablet TAKE 1 TABLET BY MOUTH TWICE A DAY 02/12/24   Shlomo Wilbert SAUNDERS, MD  Cholecalciferol (VITAMIN D3) 25 MCG (1000 UT) CAPS Take 1,000 Units by mouth every other day.    [provider]  diltiazem  (TIAZAC ) 300 MG 24 hr capsule Take 300 mg by mouth daily.    [provider]  EPINEPHrine  (EPIPEN  IJ) Inject 1 application as directed as needed.    [provider]  loratadine  (CLARITIN ) 10 MG tablet Take 10 mg by mouth daily as needed for allergies.    [provider]  metoprolol  succinate (TOPROL -XL) 25 MG 24 hr tablet Take 1 tablet (25 mg total) by mouth daily. 12/25/23   Shlomo Wilbert SAUNDERS, MD  rosuvastatin  (CRESTOR ) 10 MG tablet Take 1 tablet (10 mg total) by mouth daily. 12/25/23   Shlomo Wilbert SAUNDERS, MD    Allergies: Ace inhibitors, Rythmol [propafenone], and Nitrofurantoin    Review of Systems  Neurological:  Positive for dizziness.    Updated Vital Signs BP (!) 160/93 (BP Location: Right Arm)   Pulse 62   Temp 98.5 F (36.9 C) (Oral)   Resp 16   SpO2 99%   Physical Exam  (all labs ordered are listed, but only abnormal results are displayed) Labs Reviewed  URINALYSIS, ROUTINE  W REFLEX MICROSCOPIC - Abnormal; Notable for the following components:      Result Value   Ketones, ur 5 (*)    All other components within normal limits  I-STAT CHEM 8, ED - Abnormal; Notable for the following components:   Sodium 132 (*)    Chloride 95 (*)    Glucose, Bld 144 (*)    Hemoglobin 17.3 (*)    All other components within normal limits  CBG MONITORING, ED - Abnormal; Notable for the following components:   Glucose-Capillary 145 (*)    All other components within normal limits  CBC  DIFFERENTIAL  PROTIME-INR  APTT  COMPREHENSIVE METABOLIC PANEL WITH GFR  ETHANOL  TROPONIN I (HIGH SENSITIVITY)    EKG: None  Radiology: No  results found.  {Document cardiac monitor, telemetry assessment procedure when appropriate:32947} Procedures   Medications Ordered in the ED  sodium chloride  flush (NS) 0.9 % injection 3 mL (0 mLs Intravenous Hold 02/27/24 2211)      {Click here for ABCD2, HEART and other calculators REFRESH Note before signing:1}                              Medical Decision Making Amount and/or Complexity of Data Reviewed ECG/medicine tests: ordered.   ***  {Document critical care time when appropriate  Document review of labs and clinical decision tools ie CHADS2VASC2, etc  Document your independent review of radiology images and any outside records  Document your discussion with family members, caretakers and with consultants  Document social determinants of health affecting pt's care  Document your decision making why or why not admission, treatments were needed:32947:::1}   Final diagnoses:  None    ED Discharge Orders     None

## 2024-02-27 NOTE — ED Notes (Signed)
 Patient having testing done at this time.Not in room.

## 2024-02-27 NOTE — ED Provider Notes (Signed)
 [facility] Provider Note   CSN: [id number] Arrival date & time: 02/27/24  2107     Patient presents with: Dizziness   Kyle Kelley is a 78 y.o. male with history of permanent atrial fibrillation on anticoagulation, hypertension, hypercholesterolemia, dizziness, tobacco abuse, parotid mass, aortic stenosis, carotid artery stenosis.  Patient presents to ED for evaluation of dizziness.  States that he has been dizzy since 12 noon this afternoon.  Reports that he was going to the bathroom to wash hands for lunch when he noticed that he was dizzy.  States each is gone from a sitting to a standing position.  Reports that when he sits and goes to stand he becomes dizzy described as lightheaded.  Reports that this will worsen when he ambulates and he leans to the left.  He denies any dizziness at rest.  When he is laying in the bed he has no dizziness.  He denies headache, neck pain, visual disturbances, one-sided numbness or weakness.  He reports he has been dealing with cough, congestion which he attributes to a URI for the last 1 week.  He states he was seen at urgent care today and had negative viral panel and chest x-ray.  He was sent to the ED out of further concern of dizziness.  He denies any chest pain or shortness of breath today.  He denies any leg swelling.  Denies any abdominal pain, nausea or vomiting.  Reports he remains dizzy on standing.  States he had similar presentation in 2022 where he presented to [facility] and had stroke workup which was unremarkable.  Patient was admitted and ultimately discharged.  He denies that he has had any recurrence of symptoms since 2022.  He reports that he was told by a friend who does not work in the medical field that it could be a result of him taking metoprolol  and another medication together.  He reports that he has taken these 2 medications together since 2014 with 1 episode of dizziness.   Dizziness       Prior to Admission medications   Medication Sig Start Date End Date Taking? Authorizing Provider  apixaban  (ELIQUIS ) 5 MG TABS tablet TAKE 1 TABLET BY MOUTH TWICE A DAY 02/12/24  Yes Turner, Wilbert SAUNDERS, MD  Cholecalciferol (VITAMIN D3) 25 MCG (1000 UT) CAPS Take 1,000 Units by mouth every other day.   Yes [provider]  diltiazem  (TIAZAC ) 300 MG 24 hr capsule Take 300 mg by mouth daily.   Yes [provider]  metoprolol  succinate (TOPROL -XL) 25 MG 24 hr tablet Take 1 tablet (25 mg total) by mouth daily. 12/25/23  Yes Turner, Wilbert SAUNDERS, MD  rosuvastatin  (CRESTOR ) 10 MG tablet Take 1 tablet (10 mg total) by mouth daily. 12/25/23  Yes Turner, Wilbert SAUNDERS, MD  EPINEPHrine  (EPIPEN  IJ) Inject 1 application as directed as needed. Patient not taking: Reported on 02/27/2024    [provider]  loratadine  (CLARITIN ) 10 MG tablet Take 10 mg by mouth daily as needed for allergies. Patient not taking: Reported on 02/27/2024    [provider]    Allergies: Ace inhibitors, Rythmol [propafenone], and Nitrofurantoin    Review of Systems  Neurological:  Positive for dizziness.    Updated Vital Signs BP (!) 150/79   Pulse 72   Temp 98 F (36.7 C) (Oral)   Resp 12   SpO2 99%   Physical Exam Vitals and nursing note reviewed.  Constitutional:  General: He is not in acute distress.    Appearance: He is well-developed.  HENT:     Head: Normocephalic and atraumatic.  Eyes:     Conjunctiva/sclera: Conjunctivae normal.  Cardiovascular:     Rate and Rhythm: Normal rate and regular rhythm.     Heart sounds: No murmur heard. Pulmonary:     Effort: Pulmonary effort is normal. No respiratory distress.     Breath sounds: Normal breath sounds.  Abdominal:     Palpations: Abdomen is soft.     Tenderness: There is no abdominal tenderness.  Musculoskeletal:        General: No swelling.     Cervical back: Neck supple.  Skin:    General: Skin is warm and dry.     Capillary  Refill: Capillary refill takes less than 2 seconds.  Neurological:     General: No focal deficit present.     Mental Status: He is alert and oriented to person, place, and time. Mental status is at baseline.     GCS: GCS eye subscore is 4. GCS verbal subscore is 5. GCS motor subscore is 6.     Cranial Nerves: Cranial nerves 2-12 are intact. No cranial nerve deficit.     Sensory: Sensation is intact. No sensory deficit.     Motor: Motor function is intact. No weakness.     Coordination: Coordination is intact. Heel to [facility] Test normal.     Comments: CN III through XII intact.  Intact finger-to-nose, heel-to-shin.  No pronator drift.  No slurred speech.  No facial droop.  Equal strength throughout.  Equal sensation throughout.  PERRL.  Tracks cross midline.  Alert and oriented x 4.  LVO negative.  Psychiatric:        Mood and Affect: Mood normal.     (all labs ordered are listed, but only abnormal results are displayed) Labs Reviewed  PROTIME-INR - Abnormal; Notable for the following components:      Result Value   Prothrombin Time 15.5 (*)    All other components within normal limits  COMPREHENSIVE METABOLIC PANEL WITH GFR - Abnormal; Notable for the following components:   Sodium 128 (*)    Chloride 95 (*)    CO2 20 (*)    Glucose, Bld 140 (*)    Calcium  8.5 (*)    All other components within normal limits  URINALYSIS, ROUTINE W REFLEX MICROSCOPIC - Abnormal; Notable for the following components:   Ketones, ur 5 (*)    All other components within normal limits  I-STAT CHEM 8, ED - Abnormal; Notable for the following components:   Sodium 132 (*)    Chloride 95 (*)    Glucose, Bld 144 (*)    Hemoglobin 17.3 (*)    All other components within normal limits  CBG MONITORING, ED - Abnormal; Notable for the following components:   Glucose-Capillary 145 (*)    All other components within normal limits  APTT  CBC  DIFFERENTIAL  ETHANOL  PSA  TROPONIN I (HIGH SENSITIVITY)   TROPONIN I (HIGH SENSITIVITY)    EKG: EKG Interpretation Date/Time:  Tuesday February 27 2024 22:50:53 EDT Ventricular Rate:  62 PR Interval:    QRS Duration:  90 QT Interval:  555 QTC Calculation: 564 R Axis:   16  Text Interpretation: Atrial fibrillation Probable anterior infarct, age indeterminate Prolonged QT interval Confirmed by Corinthia No [id number]) on 02/27/2024 11:01:11 PM  Radiology: MR Brain W and Wo Contrast Result Date: 02/28/2024 CLINICAL  DATA:  Initial evaluation for acute neuro deficit, stroke suspected. EXAM: MRI HEAD WITHOUT AND WITH CONTRAST TECHNIQUE: Multiplanar, multiecho pulse sequences of the brain and surrounding structures were obtained without and with intravenous contrast. CONTRAST:  7mL GADAVIST  GADOBUTROL  1 MMOL/ML IV SOLN COMPARISON:  CT from 02/27/2024 FINDINGS: Brain: Generalized age-related cerebral atrophy. Patchy T2/FLAIR hyperintensity involving the periventricular deep white matter both cerebral hemispheres, consistent with chronic small vessel ischemic disease, mild for age. 7 mm rounded focus of signal abnormality seen involving the left dorsal medulla (series 7, image 7), corresponding with abnormality on prior CT. Lesion blooms on susceptibility weighted sequence, consistent with a small acute hemorrhage. Minimal edema within the adjacent medulla without significant regional mass effect. No visible underlying lesion or pathologic enhancement. No other acute intracranial hemorrhage. No other chronic intracranial blood products. No abnormal foci of restricted diffusion to suggest acute or subacute ischemia. No areas of chronic cortical infarction. No mass lesion, midline shift or mass effect. No hydrocephalus or extra-axial fluid collection. Pituitary gland within normal limits. No abnormal enhancement. Vascular: Major intracranial vascular flow voids are maintained. Skull and upper cervical spine: Craniocervical junction within normal limits. Bone marrow  signal intensity normal. No scalp soft tissue abnormality. Sinuses/Orbits: Prior bilateral ocular lens replacement. Mild scattered mucosal thickening about the ethmoidal air cells. Paranasal sinuses are otherwise largely clear. Trace left mastoid effusion noted, of doubtful significance. 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 These are better evaluated on prior CTA. IMPRESSION: 1. 7 mm acute hemorrhage involving the left dorsal medulla, corresponding with abnormality on prior CT. Minimal edema within the adjacent medulla without significant regional mass effect. No visible underlying lesion or pathologic enhancement. 2. No other acute intracranial abnormality. 3. Underlying age-related cerebral atrophy with mild chronic small vessel ischemic disease. Electronically Signed   By: Morene Hoard M.D.   On: 02/28/2024 01:23   CT ANGIO HEAD NECK W WO CM Result Date: 02/28/2024 CLINICAL DATA:  Initial evaluation for acute neuro deficit, stroke. EXAM: CT ANGIOGRAPHY HEAD AND NECK WITH AND WITHOUT CONTRAST TECHNIQUE: Multidetector CT imaging of the head and neck was performed using the standard protocol during bolus administration of intravenous contrast. Multiplanar CT image reconstructions and MIPs were obtained to evaluate the vascular anatomy. Carotid stenosis measurements (when applicable) are obtained utilizing NASCET criteria, using the distal internal carotid diameter as the denominator. RADIATION DOSE REDUCTION: This exam was performed according to the departmental dose-optimization program which includes automated exposure control, adjustment of the mA and/or kV according to patient size and/or use of iterative reconstruction technique. CONTRAST:  75mL OMNIPAQUE IOHEXOL 350 MG/ML SOLN COMPARISON:  CT from earlier the same day. FINDINGS: CTA NECK FINDINGS Aortic arch: Visualized aortic arch within normal  limits for caliber with standard 3 vessel morphology. Mild atheromatous change about the arch itself. Right carotid system: Right common and internal carotid arteries are patent without dissection. Atheromatous change about the right carotid bulb with associated stenosis of up to 60% by NASCET criteria. Left carotid system: Left common and internal carotid arteries are patent without dissection. Atheromatous change about the left carotid bulb without hemodynamically significant stenosis. Vertebral arteries: Both vertebral arteries arise from subclavian arteries. No proximal subclavian artery stenosis vertebral arteries patent without stenosis or dissection. Skeleton: No worrisome osseous lesions. Mild for age cervical spondylosis. Other neck: No other acute finding hyperdense nodular mass lesions are seen within the parotid glands bilaterally, largest of which on  the right measures 2.4 cm, with the largest on the left measuring 1.3 cm. Few scattered associated calcifications. 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 Upper chest: Emphysema.  No other acute finding. Review of the MIP images confirms the above findings CTA HEAD FINDINGS Anterior circulation: The mild atheromatous change about the carotid siphons without hemodynamically significant stenosis. A1 segments patent bilaterally. Normal anterior communicating artery complex. Anterior cerebral arteries patent without stenosis. No M1 stenosis or occlusion. Distal MCA branches perfused and symmetric. Posterior circulation: Both V4 segments widely patent without stenosis. Left PICA patent. Right PICA not seen. Basilar patent without stenosis. Superior cerebellar arteries patent bilaterally. Left PCA supplied via the basilar. Fetal type origin of the right PCA. Both PCAs patent without significant stenosis. Venous sinuses: Patent allowing for timing the contrast bolus. Anatomic variants: As  above.  No aneurysm. Review of the MIP images confirms the above findings IMPRESSION: 1. Negative CTA for large vessel occlusion or other emergent finding. 2. Atheromatous change about the right carotid bulb with associated stenosis of up to 60% by NASCET criteria. 3. Additional mild atheromatous change about the left carotid bulb and carotid siphons without hemodynamically significant stenosis. 4. Multiple hyperdense nodular lesions within the parotid glands bilaterally, largest of which on the right measures 2.4 cm. 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 Aortic Atherosclerosis (ICD10-I70.0) and Emphysema (ICD10-J43.9). Electronically Signed   By: Morene Hoard M.D.   On: 02/28/2024 01:00   CT HEAD WO CONTRAST Result Date: 02/27/2024 CLINICAL DATA:  Syncope EXAM: CT HEAD WITHOUT CONTRAST TECHNIQUE: Contiguous axial images were obtained from the base of the skull through the vertex without intravenous contrast. RADIATION DOSE REDUCTION: This exam was performed according to the departmental dose-optimization program which includes automated exposure control, adjustment of the mA and/or kV according to patient size and/or use of iterative reconstruction technique. COMPARISON:  MRI 09/02/2020, CT brain 09/02/2020 FINDINGS: Brain: The ventricles are nonenlarged. Mild atrophy. Minimal chronic small vessel ischemic changes of the white matter. Smudgy hyperdense focus measuring 8 mm in the lateral left medulla oblongata, series 4, image 12. No surrounding mass effect. Fourth ventricle patent. Vascular: Carotid vascular calcification. Skull: No fracture Sinuses/Orbits: Patchy mucosal thickening Other: None IMPRESSION: 1. 8 mm hyperdense focus within the left lateral medulla oblongata, this could represent a small hyperdense or hemorrhagic mass vs small focus of hemorrhagic infarct vs aneurysm felt less likely. Suggest further evaluation with  MRI with and without contrast, in addition to MRA. 2. Atrophy and mild chronic small vessel ischemic changes of the white matter Critical Value/emergent results were called by telephone at the time of interpretation on 02/27/2024 at 11:07 pm to provider Dr. Neysa, who verbally acknowledged these results. Electronically Signed   By: Luke Bun M.D.   On: 02/27/2024 23:08    .Critical Care  Performed by: Ruthell Lonni FALCON, PA-C Authorized by: Ruthell Lonni FALCON, PA-C   Critical care provider statement:    Critical care time (minutes):  55   Critical care time was exclusive of:  Separately billable procedures and treating other patients   Critical care was necessary to treat or prevent imminent or life-threatening deterioration of the following conditions:  CNS failure or compromise   Critical care was time spent personally by me on the following activities:  Blood draw for specimens, development of treatment plan with patient or surrogate, discussions with consultants, discussions with primary provider, evaluation of patient's response  to treatment, interpretation of cardiac output measurements, obtaining history from patient or surrogate, ordering and performing treatments and interventions, ordering and review of laboratory studies, ordering and review of radiographic studies, pulse oximetry, re-evaluation of patient's condition, review of old charts and examination of patient   I assumed direction of critical care for this patient from another provider in my specialty: no     Care discussed with: admitting provider      Medications Ordered in the ED  sodium chloride  flush (NS) 0.9 % injection 3 mL (0 mLs Intravenous Hold 02/27/24 2211)   stroke: early stages of recovery book (has no administration in time range)  acetaminophen  (TYLENOL ) tablet 650 mg (has no administration in time range)    Or  acetaminophen  (TYLENOL ) 160 MG/5ML solution 650 mg (has no administration in time range)    Or   acetaminophen  (TYLENOL ) suppository 650 mg (has no administration in time range)  senna-docusate (Senokot-S) tablet 1 tablet (has no administration in time range)  pantoprazole (PROTONIX) injection 40 mg (has no administration in time range)  diltiazem  (CARDIZEM  CD) 24 hr capsule 300 mg (has no administration in time range)  metoprolol  succinate (TOPROL -XL) 24 hr tablet 25 mg (has no administration in time range)  rosuvastatin  (CRESTOR ) tablet 10 mg (has no administration in time range)  meclizine  (ANTIVERT ) tablet 25 mg (25 mg Oral Given 02/27/24 2248)  iohexol (OMNIPAQUE) 350 MG/ML injection 75 mL (75 mLs Intravenous Contrast Given 02/28/24 0008)  gadobutrol  (GADAVIST ) 1 MMOL/ML injection 7 mL (7 mLs Intravenous Contrast Given 02/28/24 0038)    Clinical Course as of 02/28/24 0244  Tue Feb 27, 2024  2306 Radiology called. Hyperdense area in medulla oblongata and is recommending MRI w/wo and MRA to evaluate for mass/aneurysm per Dr. Perfecto with radiology.  [TY]  Wed Feb 28, 2024  0007 CT HEAD WO CONTRAST [CG]    Clinical Course User Index [CG] Ruthell Lonni FALCON, PA-C [TY] Neysa Caron PARAS, DO   Medical Decision Making Amount and/or Complexity of Data Reviewed ECG/medicine tests: ordered.   This is a 78 show male presenting to the ED out of concern of dizziness.  On exam, the patient is afebrile, nontachycardic.  Lung sounds are clear bilaterally, no hypoxia.  Abdomen is soft and compressible.  Neuroexam at baseline without focal neurodeficits.  Labs initiated in triage include CBC, CMP, urinalysis, ethanol, PT/INR, APTT, troponin x 2, i-STAT Chem-8, CT head, EKG.  CBC without leukocytosis or anemia.  Metabolic panel with hyponatremia 128, glucose 140, stable creatinine.  No elevated LFT.  Anion gap 13.  Prothrombin time elevated 50.5.  aPTT WNL.  Ethanol negative.  Troponins are flat.  CT head shows hyperdense area medulla oblongata.  Recommending MRI with without contrast and MRA.   Spoke with Dr. Sallyann of neurology who recommends MRI and CTA.  CTA, MRI obtained.  CT reveals atheromatous change about the right carotid bulb with associated stenosis of about 60%.  Additional mild atheromatous change about the left carotid bulb and carotid siphons without hemodynamically significant stenosis.  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  MRI reveals 7 mm acute hemorrhage involving the left dorsal medulla, corresponding with abnormality on prior CT.  Minimal edema within the adjacent medulla without significant regional mass effect.  No visible underlying lesion or pathologic enhancement.  Patient repeat neuroexam shows slight issue with finger-nose on the left however he reports that his dizziness has improved with meclizine .  At this  time patient will be mated for stroke workup.  Admitted to neurology service.  Stable on admission.  Amenable to plan.  Final diagnoses:  Dizziness  Intraparenchymal hematoma of brain, right, without loss of consciousness, initial encounter [facility])    ED Discharge Orders     None          Ruthell Lonni FALCON, PA-C 02/28/24 0244    Jerral Meth, MD 02/28/24 2256

## 2024-02-27 NOTE — ED Triage Notes (Signed)
 Coming from home, pt complaints of lightheaded, dizziness, and unsteady gait, not present before since 1200. Pt state than symptoms haven't progress for better or worse, but still present.  Alert and oriented, denies pain or recent falls.  Reports coughing and congestion for the past week.

## 2024-02-28 ENCOUNTER — Inpatient Hospital Stay (HOSPITAL_COMMUNITY)

## 2024-02-28 ENCOUNTER — Emergency Department (HOSPITAL_COMMUNITY)

## 2024-02-28 ENCOUNTER — Other Ambulatory Visit: Payer: Self-pay

## 2024-02-28 DIAGNOSIS — I613 Nontraumatic intracerebral hemorrhage in brain stem: Secondary | ICD-10-CM | POA: Diagnosis present

## 2024-02-28 DIAGNOSIS — Z8673 Personal history of transient ischemic attack (TIA), and cerebral infarction without residual deficits: Secondary | ICD-10-CM | POA: Diagnosis not present

## 2024-02-28 DIAGNOSIS — D6832 Hemorrhagic disorder due to extrinsic circulating anticoagulants: Secondary | ICD-10-CM | POA: Diagnosis present

## 2024-02-28 DIAGNOSIS — T45515A Adverse effect of anticoagulants, initial encounter: Secondary | ICD-10-CM | POA: Diagnosis present

## 2024-02-28 DIAGNOSIS — R42 Dizziness and giddiness: Secondary | ICD-10-CM | POA: Diagnosis present

## 2024-02-28 DIAGNOSIS — F17210 Nicotine dependence, cigarettes, uncomplicated: Secondary | ICD-10-CM | POA: Diagnosis present

## 2024-02-28 DIAGNOSIS — Z888 Allergy status to other drugs, medicaments and biological substances status: Secondary | ICD-10-CM | POA: Diagnosis not present

## 2024-02-28 DIAGNOSIS — I4821 Permanent atrial fibrillation: Secondary | ICD-10-CM | POA: Diagnosis present

## 2024-02-28 DIAGNOSIS — Z8546 Personal history of malignant neoplasm of prostate: Secondary | ICD-10-CM | POA: Diagnosis not present

## 2024-02-28 DIAGNOSIS — E119 Type 2 diabetes mellitus without complications: Secondary | ICD-10-CM | POA: Diagnosis present

## 2024-02-28 DIAGNOSIS — R29702 NIHSS score 2: Secondary | ICD-10-CM | POA: Diagnosis present

## 2024-02-28 DIAGNOSIS — I350 Nonrheumatic aortic (valve) stenosis: Secondary | ICD-10-CM | POA: Diagnosis present

## 2024-02-28 DIAGNOSIS — Z83438 Family history of other disorder of lipoprotein metabolism and other lipidemia: Secondary | ICD-10-CM | POA: Diagnosis not present

## 2024-02-28 DIAGNOSIS — Z85828 Personal history of other malignant neoplasm of skin: Secondary | ICD-10-CM | POA: Diagnosis not present

## 2024-02-28 DIAGNOSIS — R270 Ataxia, unspecified: Secondary | ICD-10-CM | POA: Diagnosis present

## 2024-02-28 DIAGNOSIS — E785 Hyperlipidemia, unspecified: Secondary | ICD-10-CM | POA: Diagnosis present

## 2024-02-28 DIAGNOSIS — Z923 Personal history of irradiation: Secondary | ICD-10-CM | POA: Diagnosis not present

## 2024-02-28 DIAGNOSIS — I10 Essential (primary) hypertension: Secondary | ICD-10-CM | POA: Diagnosis present

## 2024-02-28 DIAGNOSIS — Z8249 Family history of ischemic heart disease and other diseases of the circulatory system: Secondary | ICD-10-CM | POA: Diagnosis not present

## 2024-02-28 DIAGNOSIS — I6521 Occlusion and stenosis of right carotid artery: Secondary | ICD-10-CM | POA: Diagnosis present

## 2024-02-28 DIAGNOSIS — I4891 Unspecified atrial fibrillation: Secondary | ICD-10-CM

## 2024-02-28 DIAGNOSIS — Z79899 Other long term (current) drug therapy: Secondary | ICD-10-CM | POA: Diagnosis not present

## 2024-02-28 DIAGNOSIS — Z7901 Long term (current) use of anticoagulants: Secondary | ICD-10-CM

## 2024-02-28 DIAGNOSIS — R29701 NIHSS score 1: Secondary | ICD-10-CM

## 2024-02-28 DIAGNOSIS — E871 Hypo-osmolality and hyponatremia: Secondary | ICD-10-CM | POA: Diagnosis present

## 2024-02-28 DIAGNOSIS — I2510 Atherosclerotic heart disease of native coronary artery without angina pectoris: Secondary | ICD-10-CM | POA: Diagnosis present

## 2024-02-28 DIAGNOSIS — I619 Nontraumatic intracerebral hemorrhage, unspecified: Secondary | ICD-10-CM | POA: Diagnosis present

## 2024-02-28 DIAGNOSIS — Z9079 Acquired absence of other genital organ(s): Secondary | ICD-10-CM | POA: Diagnosis not present

## 2024-02-28 LAB — PSA: Prostatic Specific Antigen: <0.02 ng/mL (ref 0.00–4.00)

## 2024-02-28 LAB — MRSA NEXT GEN BY PCR, NASAL: MRSA by PCR Next Gen: NOT DETECTED

## 2024-02-28 LAB — TROPONIN I (HIGH SENSITIVITY): Troponin I (High Sensitivity): 5 ng/L (ref <18)

## 2024-02-28 MED ORDER — STROKE: EARLY STAGES OF RECOVERY BOOK
Freq: Once | Status: AC
  Filled 2024-02-28: qty 1

## 2024-02-28 MED ORDER — GADOBUTROL 1 MMOL/ML IV SOLN
7.0000 mL | Freq: Once | INTRAVENOUS | Status: AC | PRN
Start: 2024-02-28 — End: 2024-02-28
  Administered 2024-02-28: 7 mL via INTRAVENOUS

## 2024-02-28 MED ORDER — PANTOPRAZOLE SODIUM 40 MG IV SOLR: 40.0000 mg | Freq: Every day | INTRAVENOUS | Status: DC

## 2024-02-28 MED ORDER — ROSUVASTATIN CALCIUM 20 MG PO TABS
20.0000 mg | ORAL_TABLET | Freq: Every day | ORAL | Status: DC
  Administered 2024-02-28: 20 mg via ORAL
  Filled 2024-02-28: qty 1

## 2024-02-28 MED ORDER — SENNOSIDES-DOCUSATE SODIUM 8.6-50 MG PO TABS
1.0000 | ORAL_TABLET | Freq: Two times a day (BID) | ORAL | Status: DC
  Administered 2024-02-28 – 2024-02-29 (×2): 1 via ORAL
  Filled 2024-02-28 (×3): qty 1

## 2024-02-28 MED ORDER — ORAL CARE MOUTH RINSE: 15.0000 mL | OROMUCOSAL | Status: DC | PRN

## 2024-02-28 MED ORDER — ROSUVASTATIN CALCIUM 5 MG PO TABS: 10.0000 mg | ORAL_TABLET | Freq: Every day | ORAL | Status: DC

## 2024-02-28 MED ORDER — DILTIAZEM HCL ER COATED BEADS 180 MG PO CP24
300.0000 mg | ORAL_CAPSULE | Freq: Every day | ORAL | Status: DC
  Administered 2024-02-28 – 2024-02-29 (×2): 300 mg via ORAL
  Filled 2024-02-28 (×2): qty 1

## 2024-02-28 MED ORDER — LABETALOL HCL 5 MG/ML IV SOLN
10.0000 mg | INTRAVENOUS | Status: DC | PRN
  Filled 2024-02-28: qty 4

## 2024-02-28 MED ORDER — ACETAMINOPHEN 650 MG RE SUPP: 650.0000 mg | RECTAL | Status: DC | PRN

## 2024-02-28 MED ORDER — ACETAMINOPHEN 160 MG/5ML PO SOLN: 650.0000 mg | ORAL | Status: DC | PRN

## 2024-02-28 MED ORDER — CHLORHEXIDINE GLUCONATE CLOTH 2 % EX PADS
6.0000 | MEDICATED_PAD | Freq: Every day | CUTANEOUS | Status: DC
  Administered 2024-02-28: 6 via TOPICAL

## 2024-02-28 MED ORDER — ACETAMINOPHEN 325 MG PO TABS: 650.0000 mg | ORAL_TABLET | ORAL | Status: DC | PRN

## 2024-02-28 MED ORDER — LABETALOL HCL 5 MG/ML IV SOLN
20.0000 mg | Freq: Once | INTRAVENOUS | Status: AC
  Administered 2024-02-28: 20 mg via INTRAVENOUS
  Filled 2024-02-28: qty 4

## 2024-02-28 MED ORDER — ROSUVASTATIN CALCIUM 5 MG PO TABS
10.0000 mg | ORAL_TABLET | Freq: Every day | ORAL | Status: DC
  Administered 2024-02-29: 10 mg via ORAL
  Filled 2024-02-28: qty 2

## 2024-02-28 MED ORDER — IOHEXOL 350 MG/ML SOLN
75.0000 mL | Freq: Once | INTRAVENOUS | Status: AC | PRN
  Administered 2024-02-28: 75 mL via INTRAVENOUS

## 2024-02-28 MED ORDER — CLEVIDIPINE BUTYRATE 0.5 MG/ML IV EMUL
0.0000 mg/h | INTRAVENOUS | Status: DC
  Administered 2024-02-28: 2 mg/h via INTRAVENOUS
  Filled 2024-02-28: qty 50

## 2024-02-28 MED ORDER — METOPROLOL SUCCINATE ER 25 MG PO TB24
25.0000 mg | ORAL_TABLET | Freq: Every day | ORAL | Status: DC
  Administered 2024-02-28 – 2024-02-29 (×2): 25 mg via ORAL
  Filled 2024-02-28 (×2): qty 1

## 2024-02-28 NOTE — ED Notes (Signed)
 Patient transported to MRI

## 2024-02-28 NOTE — Progress Notes (Signed)
 Dr. Sallyann notified follow up head CT is done.

## 2024-02-28 NOTE — Progress Notes (Addendum)
 STROKE TEAM PROGRESS NOTE    SIGNIFICANT HOSPITAL EVENTS  9/30: presented with dizziness and gait imbalance. NIH 2.   CTH: concerning for hemorrhage in L medulla   INTERIM HISTORY/SUBJECTIVE  Wife/RN at bedside. Patient sitting up in chair.  Only deficit seen in left ataxia. Denies headache, dizziness, n/v.   Will wean off cleviprex. If able to be off for 6 hours, will transfer out of ICU.   OBJECTIVE  CBC    Component Value Date/Time   WBC 7.2 02/27/2024 2154   RBC 5.26 02/27/2024 2154   HGB 17.3 (H) 02/27/2024 2156   HCT 51.0 02/27/2024 2156   PLT 212 02/27/2024 2154   MCV 91.6 02/27/2024 2154   MCH 30.8 02/27/2024 2154   MCHC 33.6 02/27/2024 2154   RDW 12.2 02/27/2024 2154   LYMPHSABS 0.7 02/27/2024 2154   MONOABS 0.7 02/27/2024 2154   EOSABS 0.0 02/27/2024 2154   BASOSABS 0.0 02/27/2024 2154    BMET    Component Value Date/Time   NA 132 (L) 02/27/2024 2156   K 3.9 02/27/2024 2156   CL 95 (L) 02/27/2024 2156   CO2 20 (L) 02/27/2024 2154   GLUCOSE 144 (H) 02/27/2024 2156   BUN 18 02/27/2024 2156   CREATININE 0.70 02/27/2024 2156   CALCIUM  8.5 (L) 02/27/2024 2154   GFRNONAA >60 02/27/2024 2154    IMAGING past 24 hours CT HEAD WO CONTRAST Result Date: 02/28/2024 CLINICAL DATA:  78 year old male with neurologic deficit, small left dorsal medullary hemorrhage on CT and MRI. Subsequent encounter. EXAM: CT HEAD WITHOUT CONTRAST TECHNIQUE: Contiguous axial images were obtained from the base of the skull through the vertex without intravenous contrast. RADIATION DOSE REDUCTION: This exam was performed according to the departmental dose-optimization program which includes automated exposure control, adjustment of the mA and/or kV according to patient size and/or use of iterative reconstruction technique. COMPARISON:  Brain MRI 0034 hours today. Head CT 2225 hours yesterday. FINDINGS: Brain: Rounded hyperdense lesion/hemorrhage at the left dorsal medulla measuring 8 mm  diameter, up to 10 mm craniocaudal now appears stable and size but more circumscribed versus 2225 hours last night (coronal image 30). Estimated volume less than 1 mL. No significant regional mass effect. No extra-axial or intraventricular extension of blood. No ventriculomegaly. No midline shift. Stable gray-white matter differentiation throughout the brain. No cortically based acute infarct identified. Vascular: Calcified atherosclerosis at the skull base. No suspicious intracranial vascular hyperdensity. Skull: Stable and intact. Sinuses/Orbits: Stable mild paranasal sinus mucosal thickening. Tympanic cavities and mastoids well aerated. Other: No acute orbit or scalp soft tissue finding. IMPRESSION: 1. Small acute left or so medullary intra-axial hemorrhage is more circumscribed but stable size and configuration since presentation, less than 1 mL. 2. No significant intracranial mass effect or other complicating features. 3. No new intracranial abnormality. Electronically Signed   By: VEAR Hurst M.D.   On: 02/28/2024 06:44   MR Brain W and Wo Contrast Result Date: 02/28/2024 CLINICAL DATA:  Initial evaluation for acute neuro deficit, stroke suspected. EXAM: MRI HEAD WITHOUT AND WITH CONTRAST TECHNIQUE: Multiplanar, multiecho pulse sequences of the brain and surrounding structures were obtained without and with intravenous contrast. CONTRAST:  7mL GADAVIST  GADOBUTROL  1 MMOL/ML IV SOLN COMPARISON:  CT from 02/27/2024 FINDINGS: Brain: Generalized age-related cerebral atrophy. Patchy T2/FLAIR hyperintensity involving the periventricular deep white matter both cerebral hemispheres, consistent with chronic small vessel ischemic disease, mild for age. 7 mm rounded focus of signal abnormality seen involving the left dorsal medulla (series 7,  image 7), corresponding with abnormality on prior CT. Lesion blooms on susceptibility weighted sequence, consistent with a small acute hemorrhage. Minimal edema within the adjacent  medulla without significant regional mass effect. No visible underlying lesion or pathologic enhancement. No other acute intracranial hemorrhage. No other chronic intracranial blood products. No abnormal foci of restricted diffusion to suggest acute or subacute ischemia. No areas of chronic cortical infarction. No mass lesion, midline shift or mass effect. No hydrocephalus or extra-axial fluid collection. Pituitary gland within normal limits. No abnormal enhancement. Vascular: Major intracranial vascular flow voids are maintained. Skull and upper cervical spine: Craniocervical junction within normal limits. Bone marrow signal intensity normal. No scalp soft tissue abnormality. Sinuses/Orbits: Prior bilateral ocular lens replacement. Mild scattered mucosal thickening about the ethmoidal air cells. Paranasal sinuses are otherwise largely clear. Trace left mastoid effusion noted, of doubtful significance. 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 These are better evaluated on prior CTA. IMPRESSION: 1. 7 mm acute hemorrhage involving the left dorsal medulla, corresponding with abnormality on prior CT. Minimal edema within the adjacent medulla without significant regional mass effect. No visible underlying lesion or pathologic enhancement. 2. No other acute intracranial abnormality. 3. Underlying age-related cerebral atrophy with mild chronic small vessel ischemic disease. Electronically Signed   By: Morene Hoard M.D.   On: 02/28/2024 01:23   CT ANGIO HEAD NECK W WO CM Result Date: 02/28/2024 CLINICAL DATA:  Initial evaluation for acute neuro deficit, stroke. EXAM: CT ANGIOGRAPHY HEAD AND NECK WITH AND WITHOUT CONTRAST TECHNIQUE: Multidetector CT imaging of the head and neck was performed using the standard protocol during bolus administration of intravenous contrast. Multiplanar CT image reconstructions and  MIPs were obtained to evaluate the vascular anatomy. Carotid stenosis measurements (when applicable) are obtained utilizing NASCET criteria, using the distal internal carotid diameter as the denominator. RADIATION DOSE REDUCTION: This exam was performed according to the departmental dose-optimization program which includes automated exposure control, adjustment of the mA and/or kV according to patient size and/or use of iterative reconstruction technique. CONTRAST:  75mL OMNIPAQUE IOHEXOL 350 MG/ML SOLN COMPARISON:  CT from earlier the same day. FINDINGS: CTA NECK FINDINGS Aortic arch: Visualized aortic arch within normal limits for caliber with standard 3 vessel morphology. Mild atheromatous change about the arch itself. Right carotid system: Right common and internal carotid arteries are patent without dissection. Atheromatous change about the right carotid bulb with associated stenosis of up to 60% by NASCET criteria. Left carotid system: Left common and internal carotid arteries are patent without dissection. Atheromatous change about the left carotid bulb without hemodynamically significant stenosis. Vertebral arteries: Both vertebral arteries arise from subclavian arteries. No proximal subclavian artery stenosis vertebral arteries patent without stenosis or dissection. Skeleton: No worrisome osseous lesions. Mild for age cervical spondylosis. Other neck: No other acute finding hyperdense nodular mass lesions are seen within the parotid glands bilaterally, largest of which on the right measures 2.4 cm, with the largest on the left measuring 1.3 cm. Few scattered associated calcifications. 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 Upper chest: Emphysema.  No other acute finding. Review of the MIP images confirms the above findings CTA HEAD FINDINGS Anterior circulation: The mild atheromatous change about the carotid siphons without  hemodynamically significant stenosis. A1 segments patent bilaterally. Normal anterior communicating artery complex. Anterior cerebral arteries patent without stenosis. No M1 stenosis or occlusion. Distal MCA branches perfused and symmetric.  Posterior circulation: Both V4 segments widely patent without stenosis. Left PICA patent. Right PICA not seen. Basilar patent without stenosis. Superior cerebellar arteries patent bilaterally. Left PCA supplied via the basilar. Fetal type origin of the right PCA. Both PCAs patent without significant stenosis. Venous sinuses: Patent allowing for timing the contrast bolus. Anatomic variants: As above.  No aneurysm. Review of the MIP images confirms the above findings IMPRESSION: 1. Negative CTA for large vessel occlusion or other emergent finding. 2. Atheromatous change about the right carotid bulb with associated stenosis of up to 60% by NASCET criteria. 3. Additional mild atheromatous change about the left carotid bulb and carotid siphons without hemodynamically significant stenosis. 4. Multiple hyperdense nodular lesions within the parotid glands bilaterally, largest of which on the right measures 2.4 cm. 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 Aortic Atherosclerosis (ICD10-I70.0) and Emphysema (ICD10-J43.9). Electronically Signed   By: Morene Hoard M.D.   On: 02/28/2024 01:00   CT HEAD WO CONTRAST Result Date: 02/27/2024 CLINICAL DATA:  Syncope EXAM: CT HEAD WITHOUT CONTRAST TECHNIQUE: Contiguous axial images were obtained from the base of the skull through the vertex without intravenous contrast. RADIATION DOSE REDUCTION: This exam was performed according to the departmental dose-optimization program which includes automated exposure control, adjustment of the mA and/or kV according to patient size and/or use of iterative reconstruction technique. COMPARISON:  MRI 09/02/2020, CT brain  09/02/2020 FINDINGS: Brain: The ventricles are nonenlarged. Mild atrophy. Minimal chronic small vessel ischemic changes of the white matter. Smudgy hyperdense focus measuring 8 mm in the lateral left medulla oblongata, series 4, image 12. No surrounding mass effect. Fourth ventricle patent. Vascular: Carotid vascular calcification. Skull: No fracture Sinuses/Orbits: Patchy mucosal thickening Other: None IMPRESSION: 1. 8 mm hyperdense focus within the left lateral medulla oblongata, this could represent a small hyperdense or hemorrhagic mass vs small focus of hemorrhagic infarct vs aneurysm felt less likely. Suggest further evaluation with MRI with and without contrast, in addition to MRA. 2. Atrophy and mild chronic small vessel ischemic changes of the white matter Critical Value/emergent results were called by telephone at the time of interpretation on 02/27/2024 at 11:07 pm to provider Dr. Neysa, who verbally acknowledged these results. Electronically Signed   By: Luke Bun M.D.   On: 02/27/2024 23:08    Vitals:   02/28/24 0630 02/28/24 0645 02/28/24 0700 02/28/24 0715  BP: 130/80 139/77 (!) 147/75 133/72  Pulse: 69 73 79 69  Resp: 15 14 16 19   Temp:      TempSrc:      SpO2: (!) 86% 91% 91% 94%     PHYSICAL EXAM General:  Alert, well-nourished, well-developed patient in no acute distress Psych:  Mood and affect appropriate for situation CV: Regular rate and rhythm on monitor Respiratory:  Regular, unlabored respirations on room air   NEURO:  Mental Status: AA&Ox3, patient is able to give clear and coherent history Speech/Language: speech is without dysarthria or aphasia.  Naming, repetition, fluency, and comprehension intact.  Cranial Nerves:  II: PERRL. Visual fields full.  III, IV, VI: EOMI. Eyelids elevate symmetrically. No nystagmus or diplopia.  V: Sensation is intact to light touch and symmetrical to face.  VII: Face is symmetrical resting and smiling VIII: hearing intact to  voice. IX, X: Palate elevates symmetrically. Phonation is normal.  KP:Dynloizm shrug 5/5. XII: tongue is midline without fasciculations. Motor: 5/5 strength to all muscle groups tested.  Tone: is normal and bulk is normal  Sensation- Intact to light touch bilaterally. Extinction absent to light touch to DSS.   Coordination: Ataxia seen to left arm. No ataxia to left leg.  Gait- deferred  Most Recent NIH 1     ASSESSMENT/PLAN  Kyle Kelley is a 78 y.o. male hx of prostate cancer (s/p resection in 2014, s/p radiation for recurrence in 2018), afib (on Eliquis ), who presents with gait imbalance and dizziness as of 9/30 12pm, found to have hemorrhage in the left lateral medulla. Etiology of ICH possibly due to combination of hypertension (due to location) and Eliquis  use, however circular appearance is concerning for possible mass. Admitted for further stroke workup and BP management. NIH on Admission: 2.  ICH:  left medulla ICH, etiology:  Eliquis  use in the setting with ? HTN CT head Small acute left or so medullary intra-axial hemorrhage is more circumscribed but stable size and configuration since presentation, less than 1 mL CTA head & neck Negative CTA for large vessel occlusion or other emergent finding. MRI  7 mm acute hemorrhage involving the left dorsal medulla, corresponding with abnormality on prior CT.  2D Echo: 12/26/2023: EF 55 to 60% LDL pending HgbA1c pending VTE prophylaxis - SCDs.  Can transition to DVT prophylaxis if 10/2 CT is stable. Eliquis  5mg  BID prior to admission, now on No antithrombotic due to ICH Therapy recommendations: outpt PT Disposition:  pending  Atrial fibrillation Home Meds: diltiazem  300mg  daily, Metoprolol , Eliquis  5mg  BID Continue telemetry monitoring Hold anticoagulation due to ICH  Recommend CTH in 2-3 weeks, so outpatient neurology can review and then may restart Eliquis  based on CT findings.   Hypertension Home meds: TOPROL -XL 25mg  daily,  will restart today  UnStable, requiring Cleviprex Wean Cleviprex as tolerated Labetalol IVP PRN added to help facilitate transition off IV Cleviprex Blood Pressure Goal: SBP less than 160  Long-term BP goal normotensive  Hyperlipidemia Home meds:  Crestor  10mg  LDL pending, goal < 70 Now on home Crestor  10mg  Continue statin at discharge  Tobacco Abuse Patient smokes 0.5 packs per day for 50 years       Ready to quit? Yes  Other Stroke Risk Factors Advanced age  Other Active Problems Mild hyponatremia sodium 132 Hx of Prostate Cancer S/p prostatectomy 2014 PSA: <0.02  Hospital day # 0  Pt seen by Neuro NP/APP with MD. Note/plan to be edited by MD as needed.    Rocky JAYSON Likes, DNP [facility] Please use AMION for contact information & EPIC for messaging.  ATTENDING NOTE: I reviewed above note and agree with the assessment and plan. Pt was seen and examined.   Wife is at bedside.  Patient sitting in chair, neurologic intact except left upper extremity finger-to-nose mild ataxia.  Patient symptoms mild compared to CT and MRI findings.  Hold off anticoagulation due to ICH.  Continue PT and OT.  BP goal less than 160.  Wean off Cleviprex as able.  Continue home Cardizem  and metoprolol .  For detailed assessment and plan, please refer to above as I have made changes wherever appropriate.   Kyle Cummins, MD PhD Stroke Neurology 02/28/2024 5:29 PM  This patient is critically ill due to medullary ICH, hypertensive emergency, A-fib now off [facility] and at significant risk of neurological worsening, death form hematoma expansion, brainstem syndrome, stroke, hypertensive encephalopathy. This patient's care requires constant monitoring of vital signs, hemodynamics, respiratory and cardiac monitoring, review of multiple databases, neurological assessment, discussion with family, other specialists and medical decision making of high complexity. I  spent additional 30 minutes of  neurocritical care time in the care of this patient.    To contact Stroke Continuity provider, please refer to [email or web address]. After hours, contact General Neurology

## 2024-02-28 NOTE — Evaluation (Signed)
 Speech Language Pathology Evaluation Patient Details Name: MASSAI HANKERSON MRN: [id number] DOB: [birth year] Today's Date: 02/28/2024 Time: 8753-8741 SLP Time Calculation (min) (ACUTE ONLY): 12 min  Problem List:  Patient Active Problem List   Diagnosis Date Noted   ICH (intracerebral hemorrhage) (HCC) 02/28/2024   Aortic stenosis    Carotid artery stenosis    Dizziness 09/02/2020   Tobacco abuse 09/02/2020   Parotid mass 09/02/2020   Atrial fibrillation (HCC)    Malignant neoplasm of prostate (HCC) 09/05/2016   Essential hypertension, benign 05/08/2013   Pure hypercholesterolemia 05/08/2013   Permanent atrial fibrillation (HCC) 03/05/2013   Past Medical History:  Past Medical History:  Diagnosis Date   Aortic stenosis    Moderate AS with mean AVG 10.8, V-max 2.7m/s, SVI 28, DI 0.3   Carotid artery stenosis    1-39% bilaterally by dopplers 11/2022   Dyslipidemia    Hypertension    Inguinal hernia 2010   Small asymptomatic L inguinal Hernia   Permanent atrial fibrillation (HCC)    atrial fib    Pneumonia    hx of    Prostate cancer (HCC) 07/2012   s/p prostatectomy   PSA elevation    workup per Dr. Alm Fragmin, March 2014   Sigmoid diverticulosis    on colonoscopy in 2010   Skin cancer 02/2012   Dr Renay Homer   Tobacco dependence    rarely smokes-3 cigs per week   Past Surgical History:  Past Surgical History:  Procedure Laterality Date   CARDIOVERSION  08/05/2005   Direct Current Cardioversion   COLONOSCOPY  2010   Direct current Cardioversion  08/05/2005   PROSTATE BIOPSY  09/06/2012   Dr. Alm Fragmin   ROBOT ASSISTED LAPAROSCOPIC RADICAL PROSTATECTOMY N/A 10/31/2012   Procedure: ROBOTIC ASSISTED LAPAROSCOPIC RADICAL PROSTATECTOMY;  Surgeon: Alm GORMAN Fragmin, MD;  Location: [facility];  Service: Urology;  Laterality: N/A;   TONSILLECTOMY AND ADENOIDECTOMY     TONSILLECTOMY AND ADENOIDECTOMY     HPI:  78 y.o. male [facility] to [facility] hospital on [facility]/27[facility]24 with gait imbalance  and dizziness. 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 PMH includes prostate CA, afib, HLD, CAD.   Assessment / Plan / Recommendation Clinical Impression   Pt presents with functional speech, language, and cognitive functions per informal speech and cognitive-linguistic assessment completed today. No SLP needs identified. Conversational speech observed to be 100% fluent and intelligible with no dysarthria or anomia noted. Pt answered all orientation, and expressive and receptive language questions accurately. Pt denied acute changes to speech, language, or cognitive functions with onset of stroke symptoms.   SLP will sign off. Please re-consult if further communication need arise or if pt exhibits concerns for dysphagia with PO intake.     SLP Assessment  SLP Recommendation/Assessment: Patient does not need any further Speech Language Pathology Services SLP Visit Diagnosis:  (r/o speech/language deficits)     Assistance Recommended at Discharge  None (from SLP standpoint)  Functional Status Assessment Patient has not had a recent decline in their functional status  Frequency and Duration   N/a        SLP Evaluation Cognition  Overall Cognitive Status: Within Functional Limits for tasks assessed Arousal/Alertness: Awake/alert Orientation Level: Oriented X4 Year: 2025 Month: October Day of Week: Correct Attention: Focused Focused Attention: Appears intact Awareness: Appears intact       Comprehension  Auditory Comprehension Overall Auditory Comprehension: Appears within functional limits for tasks assessed  Yes/No Questions: Within Functional Limits Commands: Within Functional Limits Conversation: Complex Visual Recognition/Discrimination Discrimination: Within Function Limits    Expression Expression Primary Mode of Expression: Verbal Verbal Expression Overall Verbal Expression: Appears within functional limits for  tasks assessed Automatic Speech: Name;Social Response Level of Generative/Spontaneous Verbalization: Conversation Repetition: No impairment Naming: No impairment Pragmatics: No impairment Non-Verbal Means of Communication: Not applicable Written Expression Dominant Hand: Right   Oral / Motor  Oral Motor/Sensory Function Overall Oral Motor/Sensory Function: Within functional limits Motor Speech Overall Motor Speech: Appears within functional limits for tasks assessed Respiration: Within functional limits Phonation: Normal Resonance: Within functional limits Articulation: Within functional limitis Intelligibility: Intelligible Motor Planning: Within functional limits Motor Speech Errors: Not applicable            Peyton JINNY Rummer 02/28/2024, 1:08 PM

## 2024-02-28 NOTE — Evaluation (Signed)
 Physical Therapy Evaluation Patient Details Name: Kyle Kelley MRN: [id number] DOB: 25-Apr-1946 Today's Date: 02/28/2024  History of Present Illness  78 y.o. male presents to [facility] hospital on 02/27/2024 with gait imbalance and dizziness. 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 PMH includes prostate CA, afib, HLD, CAD.  Clinical Impression  Pt presents to PT with deficits in gait and balance. PT notes no significant focal deficits in strength, sensation or coordination. When ambulating the pt tends to consistently experience losses of balance to left side and not to right. Pt will benefit from further opportunities to mobilize and to improve upon balance deficits. PT recommends outpatient PT at the time of discharge. Pt may also benefit from further assessment of gait with a SPC to see if this mitigates falls risk.        If plan is discharge home, recommend the following: A little help with walking and/or transfers;A little help with bathing/dressing/bathroom;Assistance with cooking/housework;Help with stairs or ramp for entrance;Assist for transportation   Can travel by private vehicle        Equipment Recommendations None recommended by PT  Recommendations for Other Services       Functional Status Assessment Patient has had a recent decline in their functional status and demonstrates the ability to make significant improvements in function in a reasonable and predictable amount of time.     Precautions / Restrictions Precautions Precautions: Fall Recall of Precautions/Restrictions: Impaired Restrictions Weight Bearing Restrictions Per Provider Order: No      Mobility  Bed Mobility                    Transfers Overall transfer level: Needs assistance Equipment used: None Transfers: Sit to/from Stand Sit to Stand: Supervision                Ambulation/Gait Ambulation/Gait assistance: Contact guard  assist Gait Distance (Feet): 300 Feet Assistive device: None Gait Pattern/deviations: Step-through pattern, Staggering left Gait velocity: functional Gait velocity interpretation: 1.31 - 2.62 ft/sec, indicative of limited community ambulator   General Gait Details: pt with multiple instances of leftward loss of balance, correcting with stepping strategy. Pt demonstrates increased lateral drift with head turns, tolerates changes in gait speed and abrupt stops of gait well. Pt also ambulates backward without significant balance deviation  Stairs            Wheelchair Mobility     Tilt Bed    Modified Rankin (Stroke Patients Only) Modified Rankin (Stroke Patients Only) Pre-Morbid Rankin Score: No symptoms Modified Rankin: Moderately severe disability     Balance Overall balance assessment: Needs assistance Sitting-balance support: No upper extremity supported, Feet supported Sitting balance-Leahy Scale: Good     Standing balance support: No upper extremity supported, During functional activity Standing balance-Leahy Scale: Fair Standing balance comment: pt also maintains static standing with eyes closed for 30 seconds without significant balance deviation         Rhomberg - Eyes Opened: 20 (leftward loss of balance)                   Pertinent Vitals/Pain Pain Assessment Pain Assessment: No/denies pain    Home Living Family/patient expects to be discharged to:: Private residence Living Arrangements: Spouse/significant other Available Help at Discharge: Family;Available 24 hours/day Type of Home: House Home Access: Stairs to enter Entrance Stairs-Rails: Left Entrance Stairs-Number of Steps: 2   Home Layout: One level Home Equipment: [place] -  single point      Prior Function Prior Level of Function : Independent/Modified Independent;Driving                     Extremity/Trunk Assessment   Upper Extremity Assessment Upper Extremity Assessment:  Overall WFL for tasks assessed    Lower Extremity Assessment Lower Extremity Assessment: Overall WFL for tasks assessed    Cervical / Trunk Assessment Cervical / Trunk Assessment: Normal  Communication   Communication Communication: No apparent difficulties    Cognition Arousal: Alert Behavior During Therapy: WFL for tasks assessed/performed   PT - Cognitive impairments: Awareness, Safety/Judgement                         Following commands: Intact       Cueing Cueing Techniques: Verbal cues     General Comments General comments (skin integrity, edema, etc.): VSS on RA    Exercises     Assessment/Plan    PT Assessment Patient needs continued PT services  PT Problem List Decreased balance;Decreased mobility;Decreased safety awareness;Decreased knowledge of precautions;Decreased knowledge of use of DME       PT Treatment Interventions DME instruction;Gait training;Stair training;Therapeutic exercise;Therapeutic activities;Functional mobility training;Neuromuscular re-education;Balance training;Patient/family education    PT Goals (Current goals can be found in the Care Plan section)  Acute Rehab PT Goals Patient Stated Goal: to return home PT Goal Formulation: With patient Time For Goal Achievement: 03/13/24 Potential to Achieve Goals: Good Additional Goals Additional Goal #1: Pt will score >19/24 on the DGI to indicate a reduced risk for falls Additional Goal #2: Pt will score >45/56 on the BERG to indicate a reduced risk for falls    Frequency Min 3X/week     Co-evaluation               AM-PAC PT 6 Clicks Mobility  Outcome Measure Help needed turning from your back to your side while in a flat bed without using bedrails?: None Help needed moving from lying on your back to sitting on the side of a flat bed without using bedrails?: None Help needed moving to and from a bed to a chair (including a wheelchair)?: A Little Help needed standing  up from a chair using your arms (e.g., wheelchair or bedside chair)?: A Little Help needed to walk in hospital room?: A Little Help needed climbing 3-5 steps with a railing? : A Little 6 Click Score: 20    End of Session Equipment Utilized During Treatment: Gait belt Activity Tolerance: Patient tolerated treatment well Patient left: in chair;with call bell/phone within reach;with chair alarm set Nurse Communication: Mobility status PT Visit Diagnosis: Other abnormalities of gait and mobility (R26.89);Other symptoms and signs involving the nervous system (R29.898)    Time: 8863-8844 PT Time Calculation (min) (ACUTE ONLY): 19 min   Charges:   PT Evaluation $PT Eval Low Complexity: 1 Low   PT General Charges $$ ACUTE PT VISIT: 1 Visit         Bernardino JINNY Ruth, PT, DPT Acute Rehabilitation Office [phone]   Bernardino JINNY Ruth 02/28/2024, 12:39 PM

## 2024-02-28 NOTE — Progress Notes (Signed)
 Dr. Sallyann of Neurology notified that patient's SBP was in the 160s upon arrival to unit. Dr. Sallyann notified of DBP of 116 (SBP within range at 140 at this time) at 0330. New orders for one time dose of labetalol, followed by cleviprex infusion should these be needed. Dr. Sallyann notified that patient performed and passed a swallow screen in the ED. New diet order obtained.

## 2024-02-28 NOTE — Progress Notes (Signed)
 Pt admitted to rm 30 from 4NICU. Initiated tele. Oriented pt to the unit. Obtained vs. Call bell within reach.   Amado GORMAN Arabia, RN

## 2024-02-28 NOTE — H&P (Signed)
 NEUROLOGY H&P NOTE   Date of service: February 28, 2024 Patient Name: Kyle Kelley MRN:  [id number] DOB:  25-Jan-1946 Chief Complaint: Dizziness, gait imbalance  History of Present Illness  Kyle Kelley is a 78 y.o. male with hx of prostate cancer (s/p resection in 2014, s/p radiation for recurrence in 2018), afib (on Eliquis ), who presents with gait imbalance and dizziness as of 9/30 12pm.  Reported dizziness as positional, with onset when he stands up and relief when sitting down. Dizziness readily resolved with meclizine  25mg  x1.   He was found to have hyperdensity on [facility] concerning for hemorrhage in L medulla. MRI brain showed mildly enhancing circular lesion with SWI correlate and minimal edema.   Last known well: 02/27/24 @ 12pm Pre-Modified rankin score: 0 IV Thrombolysis: No (outside of window, ICH) Thrombectomy: No (no LVO) ICH Score: N/A NIHSS components Score: Comment  1a Level of Conscious 0[x]  1[]  2[]  3[]      1b LOC Questions 0[x]  1[]  2[]       1c LOC Commands 0[x]  1[]  2[]       2 Best Gaze 0[x]  1[]  2[]       3 Visual 0[x]  1[]  2[]  3[]      4 Facial Palsy 0[x]  1[]  2[]  3[]      5a Motor Arm - left 0[x]  1[]  2[]  3[]  4[]  UN[]    5b Motor Arm - Right 0[x]  1[]  2[]  3[]  4[]  UN[]    6a Motor Leg - Left 0[x]  1[]  2[]  3[]  4[]  UN[]    6b Motor Leg - Right 0[x]  1[]  2[]  3[]  4[]  UN[]    7 Limb Ataxia 0[]  1[]  2[x]  UN[]    LUE & LLE  8 Sensory 0[x]  1[]  2[]  UN[]      9 Best Language 0[x]  1[]  2[]  3[]      10 Dysarthria 0[x]  1[]  2[]  UN[]      11 Extinct. and Inattention 0[x]  1[]  2[]       TOTAL: 2      ROS  Comprehensive ROS performed and pertinent positives documented in the HPI  Past History   Past Medical History:  Diagnosis Date   Aortic stenosis    Moderate AS with mean AVG 10.8, V-max 2.21m/s, SVI 28, DI 0.3   Carotid artery stenosis    1-39% bilaterally by dopplers 11/2022   Dyslipidemia    Hypertension    Inguinal hernia 2010   Small asymptomatic L inguinal Hernia   Permanent  atrial fibrillation (HCC)    atrial fib    Pneumonia    hx of    Prostate cancer (HCC) 07/2012   s/p prostatectomy   PSA elevation    workup per Dr. Alm Fragmin, March 2014   Sigmoid diverticulosis    on colonoscopy in 2010   Skin cancer 02/2012   Dr Renay Homer   Tobacco dependence    rarely smokes-3 cigs per week   Past Surgical History:  Procedure Laterality Date   CARDIOVERSION  08/05/2005   Direct Current Cardioversion   COLONOSCOPY  2010   Direct current Cardioversion  08/05/2005   PROSTATE BIOPSY  09/06/2012   Dr. Alm Fragmin   ROBOT ASSISTED LAPAROSCOPIC RADICAL PROSTATECTOMY N/A 10/31/2012   Procedure: ROBOTIC ASSISTED LAPAROSCOPIC RADICAL PROSTATECTOMY;  Surgeon: Alm GORMAN Fragmin, MD;  Location: [facility];  Service: Urology;  Laterality: N/A;   TONSILLECTOMY AND ADENOIDECTOMY     TONSILLECTOMY AND ADENOIDECTOMY     Family History  Problem Relation Age of Onset   Hypertension Mother    Heart disease Father  Hyperlipidemia Father    Cancer Father        prostate   Social History   Socioeconomic History   Marital status: Married    Spouse name: Not on file   Number of children: Not on file   Years of education: Not on file   Highest education level: Not on file  Occupational History   Not on file  Tobacco Use   Smoking status: Every Day    Current packs/day: 0.50    Average packs/day: 0.5 packs/day for 50.0 years (25.0 ttl pk-yrs)    Types: Cigarettes   Smokeless tobacco: Never  Vaping Use   Vaping status: Never Used  Substance and Sexual Activity   Alcohol use: Yes    Comment: rare, heavier in the past   Drug use: No   Sexual activity: Never  Other Topics Concern   Not on file  Social History Narrative   Tobacco Use cigarettes: Current Smoker   Smoking: Yes, less than 1/2 pk per month   Alcohol: Yes - No Alcohol since June 2005   Caffeine: yes   No recreational drug use   Occupation: Employed   Marital Status: Married 38 years, wife has MS    Children: none   Social Drivers of Corporate investment banker Strain: Not on file  Food Insecurity: Not on file  Transportation Needs: Not on file  Physical Activity: Not on file  Stress: Not on file  Social Connections: Not on file   Allergies  Allergen Reactions   Ace Inhibitors Anaphylaxis   Rythmol [Propafenone] Shortness Of Breath   Nitrofurantoin Rash    Medications   Current Facility-Administered Medications:    [START ON 02/29/2024]  stroke: early stages of recovery book, , Does not apply, Once, Elie Gragert M, MD   acetaminophen  (TYLENOL ) tablet 650 mg, 650 mg, Oral, Q4H PRN **OR** acetaminophen  (TYLENOL ) 160 MG/5ML solution 650 mg, 650 mg, Per Tube, Q4H PRN **OR** acetaminophen  (TYLENOL ) suppository 650 mg, 650 mg, Rectal, Q4H PRN, Jailyn Langhorst M, MD   Chlorhexidine Gluconate Cloth 2 % PADS 6 each, 6 each, Topical, Daily, Tambi Thole M, MD, 6 each at 02/28/24 0320   [COMPLETED] labetalol (NORMODYNE) injection 20 mg, 20 mg, Intravenous, Once, 20 mg at 02/28/24 0405 **AND** clevidipine (CLEVIPREX) infusion 0.5 mg/mL, 0-21 mg/hr, Intravenous, Continuous, Maranatha Grossi M, MD   diltiazem  (CARDIZEM  CD) 24 hr capsule 300 mg, 300 mg, Oral, Daily, Bj Morlock M, MD   metoprolol  succinate (TOPROL -XL) 24 hr tablet 25 mg, 25 mg, Oral, Daily, Kinzleigh Kandler M, MD   Oral care mouth rinse, 15 mL, Mouth Rinse, PRN, Glyn Zendejas M, MD   pantoprazole (PROTONIX) injection 40 mg, 40 mg, Intravenous, QHS, Ichiro Chesnut M, MD   rosuvastatin  (CRESTOR ) tablet 10 mg, 10 mg, Oral, Daily, Atha Mcbain M, MD   senna-docusate (Senokot-S) tablet 1 tablet, 1 tablet, Oral, BID, Calib Wadhwa M, MD   sodium chloride  flush (NS) 0.9 % injection 3 mL, 3 mL, Intravenous, Once, Nivia Colon, PA-C   Vitals   Vitals:   02/28/24 0422 02/28/24 0423 02/28/24 0500 02/28/24 0510  BP:  (!) 149/84 (!) 160/85 (!) 149/90  Pulse: 69  71   Resp: 18 20 18    Temp:      TempSrc:      SpO2: 94%  95%      There is no height or weight on file to calculate  BMI.  Physical Exam   Constitutional: Appears well-developed and well-nourished.  Psych: Affect appropriate to situation.  Eyes: No scleral injection.  HENT: No OP obstruction.  Head: Normocephalic.  Cardiovascular: In afib as per monitor. Respiratory: Effort normal, non-labored breathing.  GI: Soft.  No distension. There is no tenderness.  Skin: WDI.   Neurologic Examination   Mental status: alert, oriented to person, place and time. Able to provide history. Speech: no dysarthria, word-finding difficulty, paraphasic errors. Cranial nerves: PERRL EOMI VF full Face sensation intact bilaterally. Face symmetric at rest and with activation. Hearing grossly intact. Palate elevation symmetric Tongue protrudes midline and has full range of motion. SCM's full strength bilaterally. Motor: Normal bulk and tone. No abnormal movements RUE: shoulder abduction 5/5, biceps 5/5, triceps 5/5, wrist flexion 5/5, wrist extension 5/5, hand grip 5/5 LUE: shoulder abduction 5/5, biceps 5/5, triceps 5/5, wrist flexion 5/5, wrist extension 5/5, hand grip 5/5 RLE: hip flexion 5/5, knee flexion 5/5, knee extension 5/5, ankle dorsiflexion 5/5, plantar flexion 5/5 LLE: hip flexion 5/5, knee flexion 5/5, knee extension 5/5, ankle dorsiflexion 5/5, plantar flexion 5/5 Sensory: Grossly intact to light touch throughout. Reflexes: DTR's deferred. Downgoing toes bilaterally. Coordination: FTN and HTS with ataxia of the left limbs. Gait: deferred   Labs   CBC:  Recent Labs  Lab 02/27/24 2154 02/27/24 2156  WBC 7.2  --   NEUTROABS 5.7  --   HGB 16.2 17.3*  HCT 48.2 51.0  MCV 91.6  --   PLT 212  --    Basic Metabolic Panel:  Lab Results  Component Value Date   NA 132 (L) 02/27/2024   K 3.9 02/27/2024   CO2 20 (L) 02/27/2024   GLUCOSE 144 (H) 02/27/2024   BUN 18 02/27/2024   CREATININE 0.70 02/27/2024   CALCIUM  8.5 (L) 02/27/2024   GFRNONAA >60 02/27/2024   GFRAA >90 11/01/2012    Lipid Panel:  Lab Results  Component Value Date   LDLCALC 71 11/24/2022   HgbA1c:  Lab Results  Component Value Date   HGBA1C 5.9 (H) 09/03/2020   Urine Drug Screen:     Component Value Date/Time   LABOPIA NONE DETECTED 09/02/2020 2130   COCAINSCRNUR NONE DETECTED 09/02/2020 2130   LABBENZ NONE DETECTED 09/02/2020 2130   AMPHETMU NONE DETECTED 09/02/2020 2130   THCU NONE DETECTED 09/02/2020 2130   LABBARB NONE DETECTED 09/02/2020 2130    Alcohol Level     Component Value Date/Time   [facility] <15 02/27/2024 2138   INR  Lab Results  Component Value Date   INR 1.2 02/27/2024   APTT  Lab Results  Component Value Date   APTT 34 02/27/2024    CT Head without contrast(Personally reviewed): Hyperdensity in the left medulla.  CT angio Head and Neck with contrast(Personally reviewed): No LVO. R carotid bulb with stenosis up to 60% due to atherosclerosis.  MRI Brain w/wo contrast(Personally reviewed): Minimally enhancing circular lesion in L medulla with minimal edema, 7mm.    Assessment   Kyle Kelley is a 78 y.o. male hx of prostate cancer (s/p resection in 2014, s/p radiation for recurrence in 2018), afib (on Eliquis ), who presents with gait imbalance and dizziness as of 9/30 12pm, found to have hemorrhage in the left lateral medulla. Etiology of ICH possibly due to combination of hypertension (due to location) and Eliquis  use, however circular appearance is concerning for possible mass.   Primary Diagnosis:  ICH  Secondary Diagnosis: Afib, on Eliquis   Plan  Cerebrovascular\Neuro  ICH of the left lateral medulla Etiology:  primary hypertensive  bleed vs metastatic lesion  CT Head showed hyperdensity in the left lateral medulla CTA head & neck showed no LVO. 60% stenosis of R carotid bulb. MRI brain w/wo showed minimally enhancing circular lesion in the left lateral medulla with SWI correlate consistent with hemorrhage. VTE prophylaxis - SCDs, due to acute  hemorrhage    Diet   Diet regular Room service appropriate? Yes; Fluid consistency: Thin   Eliquis  BID (last dose was in AM, > 16hrs ago therefore did not reverse) PM dose was held.  Indication is for afib and patient has not had any prior thrombotic history. Consider discussing with pharmacy regarding decreasing dosage. Therapy recommendations:  Ordered Disposition:  TBD  Atrial fibrillation Home meds:  Diltiazem , Metoprolol   Hypertension Home meds:  Diltiazem , Metoprolol   Hyperlipidemia Home meds:  rosuvastatin  10mg  LDL 71, goal < 70 Continue statin at discharge  Prostate cancer Resection in 2014 with recurrence in 2018, for which he received radiation. Annual PSA levels have been normal since then. Due for upcoming annual level in November. Ordered  Cardiovascular  -Blood pressure goal: SBP 130-150 -Labetalol & clevi gtt PRN  Respiratory   Hematology  - platelet 212 - H/H  16.2/48.2  ID\Immunology  -WBC 7.2  Renal  - Cr 0.70  Endocrine\Metabolic   GI\Nutrition  -Diet: Regular -Bowel Regimen: PRN senna  Fluid\Electrolytes  -IVF: PRN -Sodium Goal: Normal -Electrolyte Replacement: PRN  Prophylaxis\Quality\Safety:  -Venous Thrombosis: SCD -GI: IV pantop  Code Status: Full Care Level: ICU Disposition:  TBD ______________________________________________________________________   Bonney Normie CHRISTELLA Sallyann, MD [facility]

## 2024-02-28 NOTE — ED Notes (Signed)
 Swallow screen completed and patient passed.

## 2024-02-28 NOTE — Plan of Care (Signed)
  Problem: Education: Goal: Knowledge of disease or condition will improve Outcome: Progressing Goal: Knowledge of secondary prevention will improve (MUST DOCUMENT ALL) Outcome: Progressing Goal: Knowledge of patient specific risk factors will improve (DELETE if not current risk factor) Outcome: Progressing   Problem: Intracerebral Hemorrhage Tissue Perfusion: Goal: Complications of Intracerebral Hemorrhage will be minimized Outcome: Progressing   Problem: Coping: Goal: Will verbalize positive feelings about self Outcome: Progressing Goal: Will identify appropriate support needs Outcome: Progressing   Problem: Health Behavior/Discharge Planning: Goal: Ability to manage health-related needs will improve Outcome: Progressing Goal: Goals will be collaboratively established with patient/family Outcome: Progressing   Problem: Self-Care: Goal: Ability to participate in self-care as condition permits will improve Outcome: Progressing Goal: Verbalization of feelings and concerns over difficulty with self-care will improve Outcome: Progressing Goal: Ability to communicate needs accurately will improve Outcome: Progressing   Problem: Nutrition: Goal: Risk of aspiration will decrease Outcome: Progressing Goal: Dietary intake will improve Outcome: Progressing   Problem: Education: Goal: Knowledge of General Education information will improve Description: Including pain rating scale, medication(s)/side effects and non-pharmacologic comfort measures Outcome: Progressing   Problem: Health Behavior/Discharge Planning: Goal: Ability to manage health-related needs will improve Outcome: Progressing   Problem: Clinical Measurements: Goal: Ability to maintain clinical measurements within normal limits will improve Outcome: Progressing Goal: Will remain free from infection Outcome: Progressing Goal: Diagnostic test results will improve Outcome: Progressing Goal: Respiratory  complications will improve Outcome: Progressing Goal: Cardiovascular complication will be avoided Outcome: Progressing   Problem: Activity: Goal: Risk for activity intolerance will decrease Outcome: Progressing   Problem: Nutrition: Goal: Adequate nutrition will be maintained Outcome: Progressing   Problem: Coping: Goal: Level of anxiety will decrease Outcome: Progressing   Problem: Elimination: Goal: Will not experience complications related to bowel motility Outcome: Progressing Goal: Will not experience complications related to urinary retention Outcome: Progressing   Problem: Pain Managment: Goal: General experience of comfort will improve and/or be controlled Outcome: Progressing   Problem: Safety: Goal: Ability to remain free from injury will improve Outcome: Progressing   Problem: Skin Integrity: Goal: Risk for impaired skin integrity will decrease Outcome: Progressing

## 2024-02-28 NOTE — Progress Notes (Signed)
 Kyle Kelley was informed of transfer to 3W. Report called, and all belongings gathered. Called Patient's wife Kyle Kelley and she is aware of the transfer to 3W room 30. All questions answered.

## 2024-02-28 NOTE — ED Notes (Signed)
 ED TO INPATIENT HANDOFF REPORT  ED Nurse Name and Phone #: Venetia, RN   S Name/Age/Gender Kyle Kelley Kyle Kelley 78 y.o. male Room/Bed: RESUSC/RESUSC  Code Status   Code Status: Full Code  Home/SNF/Other Home Patient oriented to: self, place, time, and situation Is this baseline? Yes   Triage Complete: Triage complete  Chief Complaint ICH (intracerebral hemorrhage) (HCC) [I61.9]  Triage Note Coming from home, pt complaints of lightheaded, dizziness, and unsteady gait, not present before since 1200. Pt state than symptoms haven't progress for better or worse, but still present.  Alert and oriented, denies pain or recent falls.  Reports coughing and congestion for the past week.     Allergies Allergies  Allergen Reactions   Ace Inhibitors Anaphylaxis   Rythmol [Propafenone] Shortness Of Breath   Nitrofurantoin Rash    Level of Care/Admitting Diagnosis ED Disposition     ED Disposition  Admit   Condition  --   Comment  Hospital Area: [facility] [[id number]]  Level of Care: ICU [6]  May admit patient to [name] or [facility] if equivalent level of care is available:: Yes  Covid Evaluation: Asymptomatic - no recent exposure (last 10 days) testing not required  Diagnosis: ICH (intracerebral hemorrhage) [facility]) [[id number]]  Admitting Physician: SALLYANN NORMIE HERO [[id number]]  Attending Physician: SALLYANN NORMIE HERO [[id number]]  Certification:: I certify this patient will need inpatient services for at least 2 midnights  Expected Medical Readiness: 03/02/2024          B Medical/Surgery History Past Medical History:  Diagnosis Date   Aortic stenosis    Moderate AS with mean AVG 10.8, V-max 2.86m/s, SVI 28, DI 0.3   Carotid artery stenosis    1-39% bilaterally by dopplers 11/2022   Dyslipidemia    Hypertension    Inguinal hernia 2010   Small asymptomatic L inguinal Hernia   Permanent atrial fibrillation (HCC)    atrial fib    Pneumonia    hx of    Prostate cancer  (HCC) 07/2012   s/p prostatectomy   PSA elevation    workup per Dr. Alm Fragmin, March 2014   Sigmoid diverticulosis    on colonoscopy in 2010   Skin cancer 02/2012   Dr Renay Homer   Tobacco dependence    rarely smokes-3 cigs per week   Past Surgical History:  Procedure Laterality Date   CARDIOVERSION  08/05/2005   Direct Current Cardioversion   COLONOSCOPY  2010   Direct current Cardioversion  08/05/2005   PROSTATE BIOPSY  09/06/2012   Dr. Alm Fragmin   ROBOT ASSISTED LAPAROSCOPIC RADICAL PROSTATECTOMY N/A 10/31/2012   Procedure: ROBOTIC ASSISTED LAPAROSCOPIC RADICAL PROSTATECTOMY;  Surgeon: Alm GORMAN Fragmin, MD;  Location: [facility];  Service: Urology;  Laterality: N/A;   TONSILLECTOMY AND ADENOIDECTOMY     TONSILLECTOMY AND ADENOIDECTOMY       A IV Location/Drains/Wounds Patient Lines/Drains/Airways Status     Active Line/Drains/Airways     Name Placement date Placement time Site Days   Peripheral IV 02/27/24 20 G Left Antecubital 02/27/24  2251  Antecubital  1   Closed System Drain 1 Left LLQ Bulb (JP) 19 Fr. 10/31/12  --  LLQ  4137   Incision - 6 Ports Abdomen 1: Left;Lateral;Lower 2: Left;Lateral;Upper 3: Umbilicus;Superior 4: Right;Lateral;Lower 5: Right;Medial;Upper 6: Right;Lateral;Upper 10/31/12  --  -- 4137            Intake/Output Last 24 hours No intake or output data in the 24 hours  ending 02/28/24 0246  Labs/Imaging Results for orders placed or performed during the hospital encounter of 02/27/24 (from the past 48 hours)  Ethanol     Status: None   Collection Time: 02/27/24  9:38 PM  Result Value Ref Range   Alcohol, Ethyl (B) <15 <15 mg/dL    Comment: (NOTE) For medical purposes only. Performed at [facility], [address], [place], [place] [address]   Urinalysis, Routine w reflex microscopic -Urine, Clean Catch     Status: Abnormal   Collection Time: 02/27/24  9:42 PM  Result Value Ref Range   Color, Urine YELLOW YELLOW   APPearance CLEAR  CLEAR   Specific Gravity, Urine 1.023 1.005 - 1.030   pH 5.0 5.0 - 8.0   Glucose, UA NEGATIVE NEGATIVE mg/dL   Hgb urine dipstick NEGATIVE NEGATIVE   Bilirubin Urine NEGATIVE NEGATIVE   Ketones, ur 5 (A) NEGATIVE mg/dL   Protein, ur NEGATIVE NEGATIVE mg/dL   Nitrite NEGATIVE NEGATIVE   Leukocytes,Ua NEGATIVE NEGATIVE    Comment: Performed at [facility], [address], [place], [place] [address]  Protime-INR     Status: Abnormal   Collection Time: 02/27/24  9:54 PM  Result Value Ref Range   Prothrombin Time 15.5 (H) 11.4 - 15.2 seconds   INR 1.2 0.8 - 1.2    Comment: (NOTE) INR goal varies based on device and disease states. Performed at [facility], [address], [place], [place] [address]   APTT     Status: None   Collection Time: 02/27/24  9:54 PM  Result Value Ref Range   aPTT 34 24 - 36 seconds    Comment: Performed at [facility], [address], [place], [place] [address]  CBC     Status: None   Collection Time: 02/27/24  9:54 PM  Result Value Ref Range   WBC 7.2 4.0 - 10.5 K/uL   RBC 5.26 4.22 - 5.81 MIL/uL   Hemoglobin 16.2 13.0 - 17.0 g/dL   HCT 51.7 60.9 - 47.9 %   MCV 91.6 80.0 - 100.0 fL   MCH 30.8 26.0 - 34.0 pg   MCHC 33.6 30.0 - 36.0 g/dL   RDW 87.7 88.4 - 84.4 %   Platelets 212 150 - 400 K/uL   nRBC 0.0 0.0 - 0.2 %    Comment: Performed at [facility], [address], [place], [place] [address]  Differential     Status: None   Collection Time: 02/27/24  9:54 PM  Result Value Ref Range   Neutrophils Relative % 80 %   Neutro Abs 5.7 1.7 - 7.7 K/uL   Lymphocytes Relative 10 %   Lymphs Abs 0.7 0.7 - 4.0 K/uL   Monocytes Relative 10 %   Monocytes Absolute 0.7 0.1 - 1.0 K/uL   Eosinophils Relative 0 %   Eosinophils Absolute 0.0 0.0 - 0.5 K/uL   Basophils Relative 0 %   Basophils Absolute 0.0 0.0 - 0.1 K/uL   Immature Granulocytes 0 %   Abs Immature Granulocytes 0.03 0.00 - 0.07 K/uL    Comment: Performed at [facility], [address] [place], [place] [address]  Comprehensive metabolic panel     Status: Abnormal   Collection Time: 02/27/24  9:54 PM  Result Value Ref Range   Sodium 128 (L) 135 - 145 mmol/L   Potassium 4.0 3.5 - 5.1 mmol/L   Chloride 95 (L) 98 -  111 mmol/L   CO2 20 (L) 22 - 32 mmol/L   Glucose, Bld 140 (H) 70 - 99 mg/dL    Comment: Glucose reference range applies only to samples taken after fasting for at least 8 hours.   BUN 15 8 - 23 mg/dL   Creatinine, Ser 9.22 0.61 - 1.24 mg/dL   Calcium  8.5 (L) 8.9 - 10.3 mg/dL   Total Protein 7.7 6.5 - 8.1 g/dL   Albumin 3.5 3.5 - 5.0 g/dL   AST 32 15 - 41 U/L   ALT 27 0 - 44 U/L   Alkaline Phosphatase 61 38 - 126 U/L   Total Bilirubin 0.9 0.0 - 1.2 mg/dL   GFR, Estimated >39 >39 mL/min    Comment: (NOTE) Calculated using the CKD-EPI Creatinine Equation (2021)    Anion gap 13 5 - 15    Comment: Performed at [facility], [address] [place], [place] [address]  I-stat chem 8, ED     Status: Abnormal   Collection Time: 02/27/24  9:56 PM  Result Value Ref Range   Sodium 132 (L) 135 - 145 mmol/L   Potassium 3.9 3.5 - 5.1 mmol/L   Chloride 95 (L) 98 - 111 mmol/L   BUN 18 8 - 23 mg/dL   Creatinine, Ser 9.29 0.61 - 1.24 mg/dL   Glucose, Bld 855 (H) 70 - 99 mg/dL    Comment: Glucose reference range applies only to samples taken after fasting for at least 8 hours.   Calcium , Ion 1.18 1.15 - 1.40 mmol/L   TCO2 24 22 - 32 mmol/L   Hemoglobin 17.3 (H) 13.0 - 17.0 g/dL   HCT 48.9 60.9 - 47.9 %  CBG monitoring, ED     Status: Abnormal   Collection Time: 02/27/24  9:56 PM  Result Value Ref Range   Glucose-Capillary 145 (H) 70 - 99 mg/dL    Comment: Glucose reference range applies only to samples taken after fasting for at least 8 hours.  Troponin I (High Sensitivity)     Status: None   Collection Time: 02/27/24 10:50 PM  Result Value Ref Range   Troponin I (High Sensitivity) 5 <18 ng/L    Comment: (NOTE) Elevated high  sensitivity troponin I (hsTnI) values and significant  changes across serial measurements may suggest ACS but many other  chronic and acute conditions are known to elevate hsTnI results.  Refer to the Links section for chest pain algorithms and additional  guidance. Performed at [facility], [address], [place], [place] [address]   Troponin I (High Sensitivity)     Status: None   Collection Time: 02/28/24  1:35 AM  Result Value Ref Range   Troponin I (High Sensitivity) 5 <18 ng/L    Comment: (NOTE) Elevated high sensitivity troponin I (hsTnI) values and significant  changes across serial measurements may suggest ACS but many other  chronic and acute conditions are known to elevate hsTnI results.  Refer to the Links section for chest pain algorithms and additional  guidance. Performed at [facility], [address] [place], [place] [address]    MR Brain W and Wo Contrast Result Date: 02/28/2024 CLINICAL DATA:  Initial evaluation for acute neuro deficit, stroke suspected. EXAM: MRI HEAD WITHOUT AND WITH CONTRAST TECHNIQUE: Multiplanar, multiecho pulse sequences of the brain and surrounding structures were obtained without and with intravenous contrast. CONTRAST:  7mL GADAVIST  GADOBUTROL  1 MMOL/ML IV SOLN COMPARISON:  CT from 02/27/2024 FINDINGS: Brain:  Generalized age-related cerebral atrophy. Patchy T2/FLAIR hyperintensity involving the periventricular deep white matter both cerebral hemispheres, consistent with chronic small vessel ischemic disease, mild for age. 7 mm rounded focus of signal abnormality seen involving the left dorsal medulla (series 7, image 7), corresponding with abnormality on prior CT. Lesion blooms on susceptibility weighted sequence, consistent with a small acute hemorrhage. Minimal edema within the adjacent medulla without significant regional mass effect. No visible underlying lesion or pathologic enhancement. No other acute intracranial  hemorrhage. No other chronic intracranial blood products. No abnormal foci of restricted diffusion to suggest acute or subacute ischemia. No areas of chronic cortical infarction. No mass lesion, midline shift or mass effect. No hydrocephalus or extra-axial fluid collection. Pituitary gland within normal limits. No abnormal enhancement. Vascular: Major intracranial vascular flow voids are maintained. Skull and upper cervical spine: Craniocervical junction within normal limits. Bone marrow signal intensity normal. No scalp soft tissue abnormality. Sinuses/Orbits: Prior bilateral ocular lens replacement. Mild scattered mucosal thickening about the ethmoidal air cells. Paranasal sinuses are otherwise largely clear. Trace left mastoid effusion noted, of doubtful significance. 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 These are better evaluated on prior CTA. IMPRESSION: 1. 7 mm acute hemorrhage involving the left dorsal medulla, corresponding with abnormality on prior CT. Minimal edema within the adjacent medulla without significant regional mass effect. No visible underlying lesion or pathologic enhancement. 2. No other acute intracranial abnormality. 3. Underlying age-related cerebral atrophy with mild chronic small vessel ischemic disease. Electronically Signed   By: Morene Hoard M.D.   On: 02/28/2024 01:23   CT ANGIO HEAD NECK W WO CM Result Date: 02/28/2024 CLINICAL DATA:  Initial evaluation for acute neuro deficit, stroke. EXAM: CT ANGIOGRAPHY HEAD AND NECK WITH AND WITHOUT CONTRAST TECHNIQUE: Multidetector CT imaging of the head and neck was performed using the standard protocol during bolus administration of intravenous contrast. Multiplanar CT image reconstructions and MIPs were obtained to evaluate the vascular anatomy. Carotid stenosis measurements (when applicable) are obtained utilizing NASCET  criteria, using the distal internal carotid diameter as the denominator. RADIATION DOSE REDUCTION: This exam was performed according to the departmental dose-optimization program which includes automated exposure control, adjustment of the mA and/or kV according to patient size and/or use of iterative reconstruction technique. CONTRAST:  75mL OMNIPAQUE IOHEXOL 350 MG/ML SOLN COMPARISON:  CT from earlier the same day. FINDINGS: CTA NECK FINDINGS Aortic arch: Visualized aortic arch within normal limits for caliber with standard 3 vessel morphology. Mild atheromatous change about the arch itself. Right carotid system: Right common and internal carotid arteries are patent without dissection. Atheromatous change about the right carotid bulb with associated stenosis of up to 60% by NASCET criteria. Left carotid system: Left common and internal carotid arteries are patent without dissection. Atheromatous change about the left carotid bulb without hemodynamically significant stenosis. Vertebral arteries: Both vertebral arteries arise from subclavian arteries. No proximal subclavian artery stenosis vertebral arteries patent without stenosis or dissection. Skeleton: No worrisome osseous lesions. Mild for age cervical spondylosis. Other neck: No other acute finding hyperdense nodular mass lesions are seen within the parotid glands bilaterally, largest of which on the right measures 2.4 cm, with the largest on the left measuring 1.3 cm. Few scattered associated calcifications. 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 Upper chest: Emphysema.  No other acute finding. Review of the MIP images confirms the above findings CTA HEAD FINDINGS  Anterior circulation: The mild atheromatous change about the carotid siphons without hemodynamically significant stenosis. A1 segments patent bilaterally. Normal anterior communicating artery complex. Anterior cerebral arteries  patent without stenosis. No M1 stenosis or occlusion. Distal MCA branches perfused and symmetric. Posterior circulation: Both V4 segments widely patent without stenosis. Left PICA patent. Right PICA not seen. Basilar patent without stenosis. Superior cerebellar arteries patent bilaterally. Left PCA supplied via the basilar. Fetal type origin of the right PCA. Both PCAs patent without significant stenosis. Venous sinuses: Patent allowing for timing the contrast bolus. Anatomic variants: As above.  No aneurysm. Review of the MIP images confirms the above findings IMPRESSION: 1. Negative CTA for large vessel occlusion or other emergent finding. 2. Atheromatous change about the right carotid bulb with associated stenosis of up to 60% by NASCET criteria. 3. Additional mild atheromatous change about the left carotid bulb and carotid siphons without hemodynamically significant stenosis. 4. Multiple hyperdense nodular lesions within the parotid glands bilaterally, largest of which on the right measures 2.4 cm. 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 Aortic Atherosclerosis (ICD10-I70.0) and Emphysema (ICD10-J43.9). Electronically Signed   By: Morene Hoard M.D.   On: 02/28/2024 01:00   CT HEAD WO CONTRAST Result Date: 02/27/2024 CLINICAL DATA:  Syncope EXAM: CT HEAD WITHOUT CONTRAST TECHNIQUE: Contiguous axial images were obtained from the base of the skull through the vertex without intravenous contrast. RADIATION DOSE REDUCTION: This exam was performed according to the departmental dose-optimization program which includes automated exposure control, adjustment of the mA and/or kV according to patient size and/or use of iterative reconstruction technique. COMPARISON:  MRI 09/02/2020, CT brain 09/02/2020 FINDINGS: Brain: The ventricles are nonenlarged. Mild atrophy. Minimal chronic small vessel ischemic changes of the white matter. Smudgy  hyperdense focus measuring 8 mm in the lateral left medulla oblongata, series 4, image 12. No surrounding mass effect. Fourth ventricle patent. Vascular: Carotid vascular calcification. Skull: No fracture Sinuses/Orbits: Patchy mucosal thickening Other: None IMPRESSION: 1. 8 mm hyperdense focus within the left lateral medulla oblongata, this could represent a small hyperdense or hemorrhagic mass vs small focus of hemorrhagic infarct vs aneurysm felt less likely. Suggest further evaluation with MRI with and without contrast, in addition to MRA. 2. Atrophy and mild chronic small vessel ischemic changes of the white matter Critical Value/emergent results were called by telephone at the time of interpretation on 02/27/2024 at 11:07 pm to provider Dr. Neysa, who verbally acknowledged these results. Electronically Signed   By: Luke Bun M.D.   On: 02/27/2024 23:08    Pending Labs Unresulted Labs (From admission, onward)     Start     Ordered   02/28/24 0233  PSA  Once,   R        02/28/24 0235            Vitals/Pain Today's Vitals   02/27/24 2302 02/27/24 2305 02/28/24 0130 02/28/24 0150  BP:   (!) 150/79   Pulse:  65 72   Resp:  16 12   Temp:    98 F (36.7 C)  TempSrc:    Oral  SpO2:  100% 99%   PainSc: 0-No pain       Isolation Precautions No active isolations  Medications Medications  sodium chloride  flush (NS) 0.9 % injection 3 mL (0 mLs Intravenous Hold 02/27/24 2211)   stroke: early stages of recovery book (has no administration in time range)  acetaminophen  (TYLENOL ) tablet 650 mg (has no administration  in time range)    Or  acetaminophen  (TYLENOL ) 160 MG/5ML solution 650 mg (has no administration in time range)    Or  acetaminophen  (TYLENOL ) suppository 650 mg (has no administration in time range)  senna-docusate (Senokot-S) tablet 1 tablet (has no administration in time range)  pantoprazole (PROTONIX) injection 40 mg (has no administration in time range)  diltiazem   (CARDIZEM  CD) 24 hr capsule 300 mg (has no administration in time range)  metoprolol  succinate (TOPROL -XL) 24 hr tablet 25 mg (has no administration in time range)  rosuvastatin  (CRESTOR ) tablet 10 mg (has no administration in time range)  meclizine  (ANTIVERT ) tablet 25 mg (25 mg Oral Given 02/27/24 2248)  iohexol (OMNIPAQUE) 350 MG/ML injection 75 mL (75 mLs Intravenous Contrast Given 02/28/24 0008)  gadobutrol  (GADAVIST ) 1 MMOL/ML injection 7 mL (7 mLs Intravenous Contrast Given 02/28/24 0038)    Mobility Walks at baseline but has had increased dizziness      Focused Assessments Neuro Assessment Handoff:  Swallow screen pass? Yes  Cardiac Rhythm: Atrial fibrillation NIH Stroke Scale  Dizziness Present: Yes Headache Present: No Interval: Initial Level of Consciousness (1a.)   : Alert, keenly responsive LOC Questions (1b. )   : Answers both questions correctly LOC Commands (1c. )   : Performs both tasks correctly Best Gaze (2. )  : Normal Visual (3. )  : No visual loss Facial Palsy (4. )    : Normal symmetrical movements Motor Arm, Left (5a. )   : No drift Motor Arm, Right (5b. ) : No drift Motor Leg, Left (6a. )  : No drift Motor Leg, Right (6b. ) : No drift Limb Ataxia (7. ): Absent Sensory (8. )  : Normal, no sensory loss Best Language (9. )  : No aphasia Dysarthria (10. ): Normal Extinction/Inattention (11.)   : No Abnormality Complete NIHSS TOTAL: 0 Last date known well: 02/27/24 Last time known well: 1200 Neuro Assessment: Within Defined Limits Neuro Checks:   Initial (02/27/24 2122)  Has TPA been given? No If patient is a Neuro Trauma and patient is going to OR before floor call report to 4N Charge nurse: [phone] or [phone]   R Recommendations: See Admitting Provider Note  Report given to:   Additional Notes:

## 2024-02-29 ENCOUNTER — Other Ambulatory Visit: Payer: Self-pay | Admitting: Neurology

## 2024-02-29 DIAGNOSIS — R29701 NIHSS score 1: Secondary | ICD-10-CM | POA: Diagnosis not present

## 2024-02-29 DIAGNOSIS — I613 Nontraumatic intracerebral hemorrhage in brain stem: Secondary | ICD-10-CM

## 2024-02-29 LAB — BASIC METABOLIC PANEL WITH GFR
Anion gap: 6 (ref 5–15)
BUN: 12 mg/dL (ref 8–23)
CO2: 25 mmol/L (ref 22–32)
Calcium: 8.3 mg/dL — ABNORMAL LOW (ref 8.9–10.3)
Chloride: 96 mmol/L — ABNORMAL LOW (ref 98–111)
Creatinine, Ser: 0.78 mg/dL (ref 0.61–1.24)
GFR, Estimated: >60 mL/min (ref >60)
Glucose, Bld: 93 mg/dL (ref 70–99)
Potassium: 4.1 mmol/L (ref 3.5–5.1)
Sodium: 127 mmol/L — ABNORMAL LOW (ref 135–145)

## 2024-02-29 LAB — CBC
HCT: 44.6 % (ref 39.0–52.0)
Hemoglobin: 15.3 g/dL (ref 13.0–17.0)
MCH: 30.2 pg (ref 26.0–34.0)
MCHC: 34.3 g/dL (ref 30.0–36.0)
MCV: 88.1 fL (ref 80.0–100.0)
Platelets: 182 K/uL (ref 150–400)
RBC: 5.06 MIL/uL (ref 4.22–5.81)
RDW: 12.4 % (ref 11.5–15.5)
WBC: 6.4 K/uL (ref 4.0–10.5)
nRBC: 0 % (ref 0.0–0.2)

## 2024-02-29 LAB — LIPID PANEL
Cholesterol: 110 mg/dL (ref 0–200)
HDL: 41 mg/dL (ref >40)
LDL Cholesterol: 62 mg/dL (ref 0–99)
Total CHOL/HDL Ratio: 2.7 ratio
Triglycerides: 37 mg/dL (ref <150)
VLDL: 7 mg/dL (ref 0–40)

## 2024-02-29 LAB — HEMOGLOBIN A1C
Hgb A1c MFr Bld: 5.7 % — ABNORMAL HIGH (ref 4.8–5.6)
Mean Plasma Glucose: 116.89 mg/dL

## 2024-02-29 NOTE — Evaluation (Addendum)
 Occupational Therapy Evaluation Patient Details Name: Kyle Kelley MRN: [id number] DOB: 1945-10-06 Today's Date: 02/29/2024   History of Present Illness   78 y.o. male presents to [facility] hospital on 02/27/2024 with gait imbalance and dizziness. 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 PMH includes prostate CA, afib, HLD, CAD.     Clinical Impressions Pt admitted for the above details. Pt received seated in chair, agreeable for OT session. Wife at bedside. PTA, pt was indep with ADLs, IADLs, driving; lives with his wife in their home. Pt presents with mild LUE coordination deficits, otherwise intact strength & sensation x4 extremities. Demo's reduced insight and reasoning/executive functioning skills. Functionally, he was no more than supervision for UB/LB ADLs and CGA for functional ambulation without AD, limited by imbalance. Extensive time spent answering wife's questions re: pt's recovery and her own health. Educated on [facility] s/s stroke, safe home setup, and OT follow-up recommendations.   Pt is currently functioning below baseline and would benefit from ongoing acute OT services to progress towards safe discharge and to facilitate return to prior level of function. Current recommendation is home with outpatient OT with neuro focus.     If plan is discharge home, recommend the following:   A little help with walking and/or transfers;A little help with bathing/dressing/bathroom;Direct supervision/assist for medications management;Direct supervision/assist for financial management;Assist for transportation;Assistance with cooking/housework     Functional Status Assessment   Patient has had a recent decline in their functional status and demonstrates the ability to make significant improvements in function in a reasonable and predictable amount of time.     Equipment Recommendations   None recommended by OT      Recommendations for Other Services         Precautions/Restrictions   Precautions Precautions: Fall Recall of Precautions/Restrictions: Impaired Precaution/Restrictions Comments: reduced insight/understandin of deficits Restrictions Weight Bearing Restrictions Per Provider Order: No     Mobility Bed Mobility               General bed mobility comments: not assessed - pt OOB throughout session    Transfers Overall transfer level: Needs assistance Equipment used: None Transfers: Sit to/from Stand Sit to Stand: Supervision           General transfer comment: close SUP-CGA for functional ambulation without AD      Balance Overall balance assessment: Needs assistance Sitting-balance support: No upper extremity supported, Feet supported Sitting balance-Leahy Scale: Good Sitting balance - Comments: no LOB seated upright in chair   Standing balance support: No upper extremity supported, During functional activity Standing balance-Leahy Scale: Fair Standing balance comment: inermittent steadying assist for multiple lateral LOB and feet scissoring                           ADL either performed or assessed with clinical judgement   ADL Overall ADL's : Needs assistance/impaired Eating/Feeding: Independent   Grooming: Supervision/safety;Wash/dry hands               Lower Body Dressing: Supervision/safety;Sit to/from stand   Toilet Transfer: Supervision/safety;Ambulation;Regular Toilet   Toileting- Clothing Manipulation and Hygiene: Supervision/safety;Sitting/lateral lean;Sit to/from stand (s) Toileting - Clothing Manipulation Details (indicate cue type and reason): simulation Tub/ Shower Transfer: Contact guard assist;Cueing for safety;Cueing for sequencing;Ambulation Tub/Shower Transfer Details (indicate cue type and reason): Transferred in/out of tub/shower with wife hands on to assist stability; educated on safe bathroom setup at home  and  use of grab bars for improved safety/stability Functional mobility during ADLs: Contact guard assist       Vision Baseline Vision/History: 1 Wears glasses Patient Visual Report: No change from baseline Vision Assessment?: No apparent visual deficits;Wears glasses for reading Additional Comments: visual fields, acuity, and peripheral vision WNL     Perception         Praxis         Pertinent Vitals/Pain Pain Assessment Pain Assessment: No/denies pain     Extremity/Trunk Assessment Upper Extremity Assessment Upper Extremity Assessment: LUE deficits/detail LUE Deficits / Details: strength equal bilaterally 5/5; mild impairments in FTN and RAM coordination testing L>RUE LUE Sensation: WNL LUE Coordination: decreased fine motor   Lower Extremity Assessment Lower Extremity Assessment: Defer to PT evaluation   Cervical / Trunk Assessment Cervical / Trunk Assessment: Normal   Communication Communication Communication: No apparent difficulties   Cognition Arousal: Alert Behavior During Therapy: WFL for tasks assessed/performed, Impulsive Cognition: Cognition impaired   Orientation impairments:  (AOX4) Awareness: Intellectual awareness impaired Memory impairment (select all impairments): Working [employer] fast streamer, Copywriter, advertising Attention impairment (select first level of impairment): Selective attention Executive functioning impairment (select all impairments): Organization, Reasoning OT - Cognition Comments: pt with reduced insight into deficits; needs prompting for safety                 Following commands: Intact       Cueing  General Comments   Cueing Techniques: Verbal cues  Pt's wife present at end of session for education on assist techniques and safety   Exercises     Shoulder Instructions      Home Living Family/patient expects to be discharged to:: Private residence Living Arrangements: Spouse/significant other Available Help at  Discharge: Family;Available 24 hours/day Type of Home: House Home Access: Stairs to enter [employer] of Steps: 2 Entrance Stairs-Rails: Left Home Layout: One level     Bathroom Shower/Tub: Chief Strategy Officer: Standard     Home Equipment: Medical laboratory scientific officer - single point      Lives With: Spouse    Prior Functioning/Environment Prior Level of Function : Independent/Modified Independent;Driving             Mobility Comments: indep without DME ADLs Comments: indep with ADLs, iADLs, medication mgmt without pillbox, driving, manages finances/writes checks    OT Problem List: Decreased coordination;Decreased cognition;Decreased safety awareness;Impaired balance (sitting and/or standing)   OT Treatment/Interventions: Self-care/ADL training;Neuromuscular education;Therapeutic activities;Cognitive remediation/compensation;Balance training      OT Goals(Current goals can be found in the care plan section)   Acute Rehab OT Goals Patient Stated Goal: to return home OT Goal Formulation: With patient/family Time For Goal Achievement: 03/14/24 Potential to Achieve Goals: Good   OT Frequency:  Min 2X/week    Co-evaluation              AM-PAC OT 6 Clicks Daily Activity     Outcome Measure Help from another person eating meals?: None Help from another person taking care of personal grooming?: A Little Help from another person toileting, which includes using toliet, bedpan, or urinal?: A Little Help from another person bathing (including washing, rinsing, drying)?: A Little Help from another person to put on and taking off regular upper body clothing?: None Help from another person to put on and taking off regular lower body clothing?: None 6 Click Score: 21   End of Session Equipment Utilized During Treatment: Gait belt Nurse Communication: Mobility status  Activity Tolerance:  Patient tolerated treatment well Patient left: in chair;with call bell/phone  within reach;with chair alarm set;with family/visitor present  OT Visit Diagnosis: Unsteadiness on feet (R26.81);Ataxia, unspecified (R27.0)                Time: 8854-8773 OT Time Calculation (min): 41 min Charges:  OT General Charges $OT Visit: 1 Visit OT Evaluation $OT Eval Low Complexity: 1 Low OT Treatments $Self Care/Home Management : 23-37 mins  Lytle Malburg D., MSOT, OTR/L Acute Rehabilitation Services [phone] Secure Chat Preferred  Rikki Milch 02/29/2024, 1:34 PM

## 2024-02-29 NOTE — Discharge Summary (Addendum)
 Stroke Discharge Summary  Patient ID: Kyle Kelley   MRN: [id number]      DOB: 1946-01-22  Date of Admission: 02/27/2024 Date of Discharge: 02/29/2024  Attending Physician:  Stroke MD Patient's PCP:  Charlott Dorn LABOR, MD  DISCHARGE DIAGNOSIS:  ICH:  left medulla ICH, etiology:  Eliquis  use in the setting with ? HTN   Secondary diagnosis Atrial fibrillation Hypertension Hyperlipidemia Smoker History of prostate cancer  Allergies as of 02/29/2024       Reactions   Ace Inhibitors Anaphylaxis   Rythmol [propafenone] Shortness Of Breath   Nitrofurantoin Rash        Medication List     STOP taking these medications    Eliquis  5 MG Tabs tablet Generic drug: apixaban        TAKE these medications    diltiazem  300 MG 24 hr capsule Commonly known as: TIAZAC  Take 300 mg by mouth daily.   EPIPEN  IJ Inject 1 application as directed as needed.   loratadine  10 MG tablet Commonly known as: CLARITIN  Take 10 mg by mouth daily as needed for allergies.   metoprolol  succinate 25 MG 24 hr tablet Commonly known as: TOPROL -XL Take 1 tablet (25 mg total) by mouth daily.   rosuvastatin  10 MG tablet Commonly known as: CRESTOR  Take 1 tablet (10 mg total) by mouth daily.   Vitamin D3 25 MCG (1000 UT) Caps Take 1,000 Units by mouth every other day.        LABORATORY STUDIES CBC    Component Value Date/Time   WBC 6.4 02/29/2024 0422   RBC 5.06 02/29/2024 0422   HGB 15.3 02/29/2024 0422   HCT 44.6 02/29/2024 0422   PLT 182 02/29/2024 0422   MCV 88.1 02/29/2024 0422   MCH 30.2 02/29/2024 0422   MCHC 34.3 02/29/2024 0422   RDW 12.4 02/29/2024 0422   LYMPHSABS 0.7 02/27/2024 2154   MONOABS 0.7 02/27/2024 2154   EOSABS 0.0 02/27/2024 2154   BASOSABS 0.0 02/27/2024 2154   CMP    Component Value Date/Time   NA 127 (L) 02/29/2024 0422   K 4.1 02/29/2024 0422   CL 96 (L) 02/29/2024 0422   CO2 25 02/29/2024 0422   GLUCOSE 93 02/29/2024 0422   BUN 12  02/29/2024 0422   CREATININE 0.78 02/29/2024 0422   CALCIUM  8.3 (L) 02/29/2024 0422   PROT 7.7 02/27/2024 2154   PROT 7.6 02/01/2022 1110   ALBUMIN 3.5 02/27/2024 2154   ALBUMIN 4.5 02/01/2022 1110   AST 32 02/27/2024 2154   ALT 27 02/27/2024 2154   ALKPHOS 61 02/27/2024 2154   BILITOT 0.9 02/27/2024 2154   BILITOT 0.5 02/01/2022 1110   GFRNONAA >60 02/29/2024 0422   GFRAA >90 11/01/2012 0522   COAGS Lab Results  Component Value Date   INR 1.2 02/27/2024   INR 2.5 (H) 09/03/2020   INR 2.3 (H) 09/02/2020   Lipid Panel    Component Value Date/Time   CHOL 110 02/29/2024 0422   CHOL 136 11/24/2022 1051   TRIG 37 02/29/2024 0422   HDL 41 02/29/2024 0422   HDL 53 11/24/2022 1051   CHOLHDL 2.7 02/29/2024 0422   VLDL 7 02/29/2024 0422   LDLCALC 62 02/29/2024 0422   LDLCALC 71 11/24/2022 1051   HgbA1C  Lab Results  Component Value Date   HGBA1C 5.7 (H) 02/29/2024   Urinalysis    Component Value Date/Time   COLORURINE YELLOW 02/27/2024 2142   APPEARANCEUR CLEAR 02/27/2024 2142  LABSPEC 1.023 02/27/2024 2142   PHURINE 5.0 02/27/2024 2142   GLUCOSEU NEGATIVE 02/27/2024 2142   HGBUR NEGATIVE 02/27/2024 2142   BILIRUBINUR NEGATIVE 02/27/2024 2142   KETONESUR 5 (A) 02/27/2024 2142   PROTEINUR NEGATIVE 02/27/2024 2142   NITRITE NEGATIVE 02/27/2024 2142   LEUKOCYTESUR NEGATIVE 02/27/2024 2142   Urine Drug Screen     Component Value Date/Time   LABOPIA NONE DETECTED 09/02/2020 2130   COCAINSCRNUR NONE DETECTED 09/02/2020 2130   LABBENZ NONE DETECTED 09/02/2020 2130   AMPHETMU NONE DETECTED 09/02/2020 2130   THCU NONE DETECTED 09/02/2020 2130   LABBARB NONE DETECTED 09/02/2020 2130    Alcohol Level    Component Value Date/Time   [facility] <15 02/27/2024 2138     SIGNIFICANT DIAGNOSTIC STUDIES CT HEAD WO CONTRAST Result Date: 02/28/2024 CLINICAL DATA:  78 year old male with neurologic deficit, small left dorsal medullary hemorrhage on CT and MRI. Subsequent encounter.  EXAM: CT HEAD WITHOUT CONTRAST TECHNIQUE: Contiguous axial images were obtained from the base of the skull through the vertex without intravenous contrast. RADIATION DOSE REDUCTION: This exam was performed according to the departmental dose-optimization program which includes automated exposure control, adjustment of the mA and/or kV according to patient size and/or use of iterative reconstruction technique. COMPARISON:  Brain MRI 0034 hours today. Head CT 2225 hours yesterday. FINDINGS: Brain: Rounded hyperdense lesion/hemorrhage at the left dorsal medulla measuring 8 mm diameter, up to 10 mm craniocaudal now appears stable and size but more circumscribed versus 2225 hours last night (coronal image 30). Estimated volume less than 1 mL. No significant regional mass effect. No extra-axial or intraventricular extension of blood. No ventriculomegaly. No midline shift. Stable gray-white matter differentiation throughout the brain. No cortically based acute infarct identified. Vascular: Calcified atherosclerosis at the skull base. No suspicious intracranial vascular hyperdensity. Skull: Stable and intact. Sinuses/Orbits: Stable mild paranasal sinus mucosal thickening. Tympanic cavities and mastoids well aerated. Other: No acute orbit or scalp soft tissue finding. IMPRESSION: 1. Small acute left or so medullary intra-axial hemorrhage is more circumscribed but stable size and configuration since presentation, less than 1 mL. 2. No significant intracranial mass effect or other complicating features. 3. No new intracranial abnormality. Electronically Signed   By: VEAR Hurst M.D.   On: 02/28/2024 06:44   MR Brain W and Wo Contrast Result Date: 02/28/2024 CLINICAL DATA:  Initial evaluation for acute neuro deficit, stroke suspected. EXAM: MRI HEAD WITHOUT AND WITH CONTRAST TECHNIQUE: Multiplanar, multiecho pulse sequences of the brain and surrounding structures were obtained without and with intravenous contrast. CONTRAST:  7mL  GADAVIST  GADOBUTROL  1 MMOL/ML IV SOLN COMPARISON:  CT from 02/27/2024 FINDINGS: Brain: Generalized age-related cerebral atrophy. Patchy T2/FLAIR hyperintensity involving the periventricular deep white matter both cerebral hemispheres, consistent with chronic small vessel ischemic disease, mild for age. 7 mm rounded focus of signal abnormality seen involving the left dorsal medulla (series 7, image 7), corresponding with abnormality on prior CT. Lesion blooms on susceptibility weighted sequence, consistent with a small acute hemorrhage. Minimal edema within the adjacent medulla without significant regional mass effect. No visible underlying lesion or pathologic enhancement. No other acute intracranial hemorrhage. No other chronic intracranial blood products. No abnormal foci of restricted diffusion to suggest acute or subacute ischemia. No areas of chronic cortical infarction. No mass lesion, midline shift or mass effect. No hydrocephalus or extra-axial fluid collection. Pituitary gland within normal limits. No abnormal enhancement. Vascular: Major intracranial vascular flow voids are maintained. Skull and upper cervical spine: Craniocervical junction within normal  limits. Bone marrow signal intensity normal. No scalp soft tissue abnormality. Sinuses/Orbits: Prior bilateral ocular lens replacement. Mild scattered mucosal thickening about the ethmoidal air cells. Paranasal sinuses are otherwise largely clear. Trace left mastoid effusion noted, of doubtful significance. 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 These are better evaluated on prior CTA. IMPRESSION: 1. 7 mm acute hemorrhage involving the left dorsal medulla, corresponding with abnormality on prior CT. Minimal edema within the adjacent medulla without significant regional mass effect. No visible underlying lesion or pathologic enhancement. 2. No other acute  intracranial abnormality. 3. Underlying age-related cerebral atrophy with mild chronic small vessel ischemic disease. Electronically Signed   By: Morene Hoard M.D.   On: 02/28/2024 01:23   CT ANGIO HEAD NECK W WO CM Result Date: 02/28/2024 CLINICAL DATA:  Initial evaluation for acute neuro deficit, stroke. EXAM: CT ANGIOGRAPHY HEAD AND NECK WITH AND WITHOUT CONTRAST TECHNIQUE: Multidetector CT imaging of the head and neck was performed using the standard protocol during bolus administration of intravenous contrast. Multiplanar CT image reconstructions and MIPs were obtained to evaluate the vascular anatomy. Carotid stenosis measurements (when applicable) are obtained utilizing NASCET criteria, using the distal internal carotid diameter as the denominator. RADIATION DOSE REDUCTION: This exam was performed according to the departmental dose-optimization program which includes automated exposure control, adjustment of the mA and/or kV according to patient size and/or use of iterative reconstruction technique. CONTRAST:  75mL OMNIPAQUE IOHEXOL 350 MG/ML SOLN COMPARISON:  CT from earlier the same day. FINDINGS: CTA NECK FINDINGS Aortic arch: Visualized aortic arch within normal limits for caliber with standard 3 vessel morphology. Mild atheromatous change about the arch itself. Right carotid system: Right common and internal carotid arteries are patent without dissection. Atheromatous change about the right carotid bulb with associated stenosis of up to 60% by NASCET criteria. Left carotid system: Left common and internal carotid arteries are patent without dissection. Atheromatous change about the left carotid bulb without hemodynamically significant stenosis. Vertebral arteries: Both vertebral arteries arise from subclavian arteries. No proximal subclavian artery stenosis vertebral arteries patent without stenosis or dissection. Skeleton: No worrisome osseous lesions. Mild for age cervical spondylosis. Other  neck: No other acute finding hyperdense nodular mass lesions are seen within the parotid glands bilaterally, largest of which on the right measures 2.4 cm, with the largest on the left measuring 1.3 cm. Few scattered associated calcifications. 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 Upper chest: Emphysema.  No other acute finding. Review of the MIP images confirms the above findings CTA HEAD FINDINGS Anterior circulation: The mild atheromatous change about the carotid siphons without hemodynamically significant stenosis. A1 segments patent bilaterally. Normal anterior communicating artery complex. Anterior cerebral arteries patent without stenosis. No M1 stenosis or occlusion. Distal MCA branches perfused and symmetric. Posterior circulation: Both V4 segments widely patent without stenosis. Left PICA patent. Right PICA not seen. Basilar patent without stenosis. Superior cerebellar arteries patent bilaterally. Left PCA supplied via the basilar. Fetal type origin of the right PCA. Both PCAs patent without significant stenosis. Venous sinuses: Patent allowing for timing the contrast bolus. Anatomic variants: As above.  No aneurysm. Review of the MIP images confirms the above findings IMPRESSION: 1. Negative CTA for large vessel occlusion or other emergent finding. 2. Atheromatous change about the right carotid bulb with associated stenosis of up to 60% by NASCET criteria. 3. Additional mild atheromatous change about the left  carotid bulb and carotid siphons without hemodynamically significant stenosis. 4. Multiple hyperdense nodular lesions within the parotid glands bilaterally, largest of which on the right measures 2.4 cm. 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 Aortic Atherosclerosis (ICD10-I70.0) and Emphysema (ICD10-J43.9). Electronically Signed   By: Morene Hoard  M.D.   On: 02/28/2024 01:00   CT HEAD WO CONTRAST Result Date: 02/27/2024 CLINICAL DATA:  Syncope EXAM: CT HEAD WITHOUT CONTRAST TECHNIQUE: Contiguous axial images were obtained from the base of the skull through the vertex without intravenous contrast. RADIATION DOSE REDUCTION: This exam was performed according to the departmental dose-optimization program which includes automated exposure control, adjustment of the mA and/or kV according to patient size and/or use of iterative reconstruction technique. COMPARISON:  MRI 09/02/2020, CT brain 09/02/2020 FINDINGS: Brain: The ventricles are nonenlarged. Mild atrophy. Minimal chronic small vessel ischemic changes of the white matter. Smudgy hyperdense focus measuring 8 mm in the lateral left medulla oblongata, series 4, image 12. No surrounding mass effect. Fourth ventricle patent. Vascular: Carotid vascular calcification. Skull: No fracture Sinuses/Orbits: Patchy mucosal thickening Other: None IMPRESSION: 1. 8 mm hyperdense focus within the left lateral medulla oblongata, this could represent a small hyperdense or hemorrhagic mass vs small focus of hemorrhagic infarct vs aneurysm felt less likely. Suggest further evaluation with MRI with and without contrast, in addition to MRA. 2. Atrophy and mild chronic small vessel ischemic changes of the white matter Critical Value/emergent results were called by telephone at the time of interpretation on 02/27/2024 at 11:07 pm to provider Dr. Neysa, who verbally acknowledged these results. Electronically Signed   By: Luke Bun M.D.   On: 02/27/2024 23:08      HISTORY OF PRESENT ILLNESS JEYDEN COFFELT is a 78 y.o. male hx of prostate cancer (s/p resection in 2014, s/p radiation for recurrence in 2018), afib (on Eliquis ), who presents with gait imbalance and dizziness as of 9/30 12pm, found to have hemorrhage in the left lateral medulla. Etiology of ICH possibly due to combination of hypertension (due to location) and  Eliquis  use, however circular appearance is concerning for possible mass. Admitted for further stroke workup and BP management. NIH on Admission: 2.    HOSPITAL COURSE ICH:  left medulla ICH, etiology:  Eliquis  use in the setting with ? HTN CT head Small acute left or so medullary intra-axial hemorrhage is more circumscribed but stable size and configuration since presentation, less than 1 mL CTA head & neck Negative CTA for large vessel occlusion or other emergent finding. MRI  7 mm acute hemorrhage involving the left dorsal medulla, corresponding with abnormality on prior CT.  2D Echo: 12/26/2023: EF 55 to 60% LDL 62 HgbA1c 5.7 VTE prophylaxis - SCDs.  Can transition to DVT prophylaxis if 10/2 CT is stable. Eliquis  5mg  BID prior to admission, now on No antithrombotic due to ICH Therapy recommendations: outpt PT Disposition:  pending   Atrial fibrillation Home Meds: diltiazem  300mg  daily, Metoprolol , Eliquis  5mg  BID Continue telemetry monitoring Hold anticoagulation due to ICH  Recommend CTH in 2-3 weeks, so outpatient neurology can review and then may restart Eliquis  based on CT findings.    Hypertension Home meds: TOPROL -XL 25mg  daily, will restart today  UnStable, requiring Cleviprex Wean Cleviprex as tolerated Labetalol IVP PRN added to help facilitate transition off IV Cleviprex Blood Pressure Goal: SBP less than 160  Long-term BP goal normotensive   Hyperlipidemia Home meds:  Crestor  10mg  LDL 62, goal < 70  Now on home Crestor  10mg  Continue statin at discharge  Tobacco Abuse Patient smokes 0.5 packs per day for 50 years       Ready to quit? Yes   Other Stroke Risk Factors Advanced age   Other Active Problems Mild hyponatremia sodium 132 Hx of Prostate Cancer S/p prostatectomy 2014 PSA: <0.02   DISCHARGE EXAM Blood pressure 132/75, pulse 62, temperature 97.6 F (36.4 C), temperature source Oral, resp. rate 18, height 6' 1 (1.854 m), weight 73 kg, SpO2  99%.  PHYSICAL EXAM General:  Alert, well-nourished, well-developed patient in no acute distress Psych:  Mood and affect appropriate for situation CV: Regular rate and rhythm on monitor Respiratory:  Regular, unlabored respirations on room air     NEURO:  Mental Status: AA&Ox3, patient is able to give clear and coherent history Speech/Language: speech is without dysarthria or aphasia.  Naming, repetition, fluency, and comprehension intact.   Cranial Nerves:  II: PERRL. Visual fields full.  III, IV, VI: EOMI. Eyelids elevate symmetrically. No nystagmus or diplopia.  V: Sensation is intact to light touch and symmetrical to face.  VII: Face is symmetrical resting and smiling VIII: hearing intact to voice. IX, X: Palate elevates symmetrically. Phonation is normal.  KP:Dynloizm shrug 5/5. XII: tongue is midline without fasciculations. Motor: 5/5 strength to all muscle groups tested.  Tone: is normal and bulk is normal Sensation- Intact to light touch bilaterally. Extinction absent to light touch to DSS.   Coordination: Ataxia left arm. No ataxia to left leg.  Gait- deferred   Most Recent NIH 1  Discharge Diet       Diet   Diet regular Room service appropriate? Yes; Fluid consistency: Thin   liquids  DISCHARGE PLAN Disposition:  home with outpatient PT No antithrombotic due to ICH.  Recommend CT head in 2 to 3 weeks (outpatient CT has been ordered) Ongoing stroke risk factor control by Primary Care Physician at time of discharge Follow-up PCP Charlott Dorn LABOR, MD in 2 weeks. Follow-up in [facility] in 2-3 weeks, office to schedule an appointment.   35 minutes were spent preparing discharge.   Rocky JAYSON Likes, DNP [facility]  ATTENDING NOTE: I reviewed above note and agree with the assessment and plan. Pt was seen and examined.   Seen in the hallway working with PT and OT.  Wife with patient.  Doing well, PT and OT  recommend outpatient therapy.  Hold off Eliquis  for now.  Repeat CT 2 to 3 weeks as outpatient and decide on further Eliquis  use.  Medically ready for discharge.  Follow-up with [facility].  For detailed assessment and plan, please refer to above as I have made changes wherever appropriate.   Ary Cummins, MD PhD Stroke Neurology 02/29/2024 6:05 PM

## 2024-02-29 NOTE — Progress Notes (Signed)
 AVS teaching provided to patient and wife. No further questions/concerns noted. PIV and telemetry removed. Patient discharged home in stable condition.

## 2024-02-29 NOTE — Progress Notes (Signed)
 Physical Therapy Treatment Patient Details Name: Kyle Kelley MRN: [id number] DOB: 1945/06/18 Today's Date: 02/29/2024   History of Present Illness 78 y.o. male presents to [facility] hospital on 02/27/2024 with gait imbalance and dizziness. 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 PMH includes prostate CA, afib, HLD, CAD.    PT Comments  Pt received sitting EOB and agreeable to session. Pt demonstrates impaired safety awareness and awareness of deficits requiring cues for impulsivity throughout session. Pt requires intermittent min A during gait trial due to instability and L LOB, but pt demonstrates decreased awareness stating that this therapist was pulling him over with the belt during correction. Pt demonstrates great activity tolerance and motivation to increase independence. Pt demonstrates increased instability during stair trial, but improves some with cues for pacing. Pt's wife present at end of session for education on guarding techniques and is able to demonstrate use of belt for safety. Extensive time spent on education of safety, reduced fall risk, activity progression, and home set up. Pt continues to benefit from PT services to progress toward functional mobility goals.     If plan is discharge home, recommend the following: A little help with walking and/or transfers;A little help with bathing/dressing/bathroom;Assistance with cooking/housework;Help with stairs or ramp for entrance;Assist for transportation   Can travel by private vehicle        Equipment Recommendations  None recommended by PT    Recommendations for Other Services       Precautions / Restrictions Precautions Precautions: Fall Recall of Precautions/Restrictions: Impaired Restrictions Weight Bearing Restrictions Per Provider Order: No     Mobility  Bed Mobility               General bed mobility comments: Pt sitting EOB upon entry     Transfers Overall transfer level: Needs assistance Equipment used: None Transfers: Sit to/from Stand Sit to Stand: Supervision, Contact guard assist           General transfer comment: CGA-supervision for safety due to impulsivity and instability    Ambulation/Gait Ambulation/Gait assistance: Contact guard assist, Min assist Gait Distance (Feet): 250 Feet (x2) Assistive device: None, Straight cane Gait Pattern/deviations: Step-through pattern, Staggering left       General Gait Details: Cues for slowed pacing due to instability and decreased awareness. Pt requiring intermittent min A for balance, however pt with decreased awareness and reports therapist correcting balance with belt was causing LOB. Improved stability with slowed pace, but requires frequent cues. LOB during turns requiring assist to correct. Pt initially using SPC, but declines use and picks it up during trial   Stairs Stairs: Yes Stairs assistance: Min assist Stair Management: One rail Right, Alternating pattern Number of Stairs: 2 (x3) General stair comments: Cues for pacing and technique. Pt with instability and LOB requiring min A to correct   Wheelchair Mobility     Tilt Bed    Modified Rankin (Stroke Patients Only) Modified Rankin (Stroke Patients Only) Pre-Morbid Rankin Score: No symptoms Modified Rankin: Moderately severe disability     Balance Overall balance assessment: Needs assistance Sitting-balance support: No upper extremity supported, Feet supported Sitting balance-Leahy Scale: Good Sitting balance - Comments: sitting EOB   Standing balance support: No upper extremity supported, During functional activity, Single extremity supported Standing balance-Leahy Scale: Fair Standing balance comment: requires intermittent min A for dynamic balance  Communication Communication Communication: No apparent difficulties  Cognition Arousal:  Alert Behavior During Therapy: WFL for tasks assessed/performed, Impulsive   PT - Cognitive impairments: Awareness, Safety/Judgement                       PT - Cognition Comments: Decreased safety awareness and awareness of deficits requiring cues throughout Following commands: Intact      Cueing Cueing Techniques: Verbal cues  Exercises Other Exercises Other Exercises: BLE forward and lateral step ups x10 each Other Exercises: serial STS without UE support x10    General Comments General comments (skin integrity, edema, etc.): Pt's wife present at end of session for education on assist techniques and safety      Pertinent Vitals/Pain Pain Assessment Pain Assessment: No/denies pain     PT Goals (current goals can now be found in the care plan section) Acute Rehab PT Goals Patient Stated Goal: to return home PT Goal Formulation: With patient Time For Goal Achievement: 03/13/24 Progress towards PT goals: Progressing toward goals    Frequency    Min 3X/week       AM-PAC PT 6 Clicks Mobility   Outcome Measure  Help needed turning from your back to your side while in a flat bed without using bedrails?: None Help needed moving from lying on your back to sitting on the side of a flat bed without using bedrails?: None Help needed moving to and from a bed to a chair (including a wheelchair)?: A Little Help needed standing up from a chair using your arms (e.g., wheelchair or bedside chair)?: A Little Help needed to walk in hospital room?: A Little Help needed climbing 3-5 steps with a railing? : A Little 6 Click Score: 20    End of Session Equipment Utilized During Treatment: Gait belt Activity Tolerance: Patient tolerated treatment well Patient left: in chair;with call bell/phone within reach;with chair alarm set;with family/visitor present Nurse Communication: Mobility status PT Visit Diagnosis: Other abnormalities of gait and mobility (R26.89);Other  symptoms and signs involving the nervous system (R29.898)     Time: 1023-1130 PT Time Calculation (min) (ACUTE ONLY): 67 min  Charges:    $Gait Training: 38-52 mins $Therapeutic Exercise: 8-22 mins PT General Charges $$ ACUTE PT VISIT: 1 Visit                     Darryle George, PTA Acute Rehabilitation Services Secure Chat Preferred  [phone]    Darryle George 02/29/2024, 12:24 PM

## 2024-02-29 NOTE — TOC Transition Note (Signed)
 Transition of Care [facility]) - Discharge Note   Patient Details  Name: BILLIE INTRIAGO MRN: [id number] Date of Birth: [birth year]  Transition of Care [facility]) CM/SW Contact:  Andrez JULIANNA George, RN Phone Number: 02/29/2024, 1:37 PM   Clinical Narrative:     Pt is discharging home with outpatient therapy referral sent to [facility]. Information on the AVS. Raford will contact the patient for the first appointment. Pt has a cane at home.  Pt drives self but spouse can provide transportation as needed.  Pt manages his own medications.  Wife will transport home.  Final next level of care: OP Rehab Barriers to Discharge: No Barriers Identified   Patient Goals and CMS Choice     Choice offered to / list presented to : Patient, Spouse      Discharge Placement                       Discharge Plan and Services Additional resources added to the After Visit Summary for                                       Social Drivers of Health (SDOH) Interventions SDOH Screenings   Food Insecurity: No Food Insecurity (02/28/2024)  Housing: Low Risk  (02/28/2024)  Transportation Needs: No Transportation Needs (02/28/2024)  Utilities: Not At Risk (02/28/2024)  Social Connections: Socially Integrated (02/28/2024)  Tobacco Use: High Risk (02/27/2024)     Readmission Risk Interventions     No data to display

## 2024-02-29 NOTE — Plan of Care (Signed)
  Problem: Education: Goal: Knowledge of disease or condition will improve Outcome: Progressing Goal: Knowledge of secondary prevention will improve (MUST DOCUMENT ALL) Outcome: Progressing Goal: Knowledge of patient specific risk factors will improve (DELETE if not current risk factor) Outcome: Progressing   Problem: Intracerebral Hemorrhage Tissue Perfusion: Goal: Complications of Intracerebral Hemorrhage will be minimized Outcome: Progressing   Problem: Coping: Goal: Will verbalize positive feelings about self Outcome: Progressing Goal: Will identify appropriate support needs Outcome: Progressing   Problem: Health Behavior/Discharge Planning: Goal: Ability to manage health-related needs will improve Outcome: Progressing Goal: Goals will be collaboratively established with patient/family Outcome: Progressing   Problem: Self-Care: Goal: Ability to participate in self-care as condition permits will improve Outcome: Progressing Goal: Verbalization of feelings and concerns over difficulty with self-care will improve Outcome: Progressing Goal: Ability to communicate needs accurately will improve Outcome: Progressing   Problem: Nutrition: Goal: Risk of aspiration will decrease Outcome: Progressing Goal: Dietary intake will improve Outcome: Progressing   Problem: Education: Goal: Knowledge of General Education information will improve Description: Including pain rating scale, medication(s)/side effects and non-pharmacologic comfort measures Outcome: Progressing   Problem: Health Behavior/Discharge Planning: Goal: Ability to manage health-related needs will improve Outcome: Progressing   Problem: Clinical Measurements: Goal: Ability to maintain clinical measurements within normal limits will improve Outcome: Progressing Goal: Will remain free from infection Outcome: Progressing Goal: Diagnostic test results will improve Outcome: Progressing Goal: Respiratory  complications will improve Outcome: Progressing Goal: Cardiovascular complication will be avoided Outcome: Progressing   Problem: Activity: Goal: Risk for activity intolerance will decrease Outcome: Progressing   Problem: Nutrition: Goal: Adequate nutrition will be maintained Outcome: Progressing   Problem: Coping: Goal: Level of anxiety will decrease Outcome: Progressing   Problem: Elimination: Goal: Will not experience complications related to bowel motility Outcome: Progressing Goal: Will not experience complications related to urinary retention Outcome: Progressing   Problem: Pain Managment: Goal: General experience of comfort will improve and/or be controlled Outcome: Progressing   Problem: Safety: Goal: Ability to remain free from injury will improve Outcome: Progressing   Problem: Skin Integrity: Goal: Risk for impaired skin integrity will decrease Outcome: Progressing

## 2024-03-02 DIAGNOSIS — Z23 Encounter for immunization: Secondary | ICD-10-CM | POA: Diagnosis not present

## 2024-03-04 ENCOUNTER — Telehealth: Payer: Self-pay

## 2024-03-04 DIAGNOSIS — I613 Nontraumatic intracerebral hemorrhage in brain stem: Secondary | ICD-10-CM

## 2024-03-04 NOTE — Progress Notes (Signed)
..  Complex Care Management   03/04/2024  Name: Kyle Kelley  MRN: [id number]  DOB: 12/25/1945  Inbound call received from Lynwood JULIANNA Pontes after receiving an Interactive Voice Response (IVR) call after a recent hospitalization. Information was provided about Care Management services.  Follow up plan: Referral placed for Emmi Stroke.     Leita Lyme, BLANCH CAULK Freeman  [facility], [facility] Health Care Management Assistant [phone]

## 2024-03-04 NOTE — Transitions of Care (Post Inpatient/ED Visit) (Signed)
  Stroke Discharge Follow-up   03/04/2024 Name:  Kyle Kelley MRN:  [id number] DOB:  10-07-1945  Subjective: Kyle Kelley is a 78 y.o. year old male who is a primary care patient of Charlott Dorn LABOR, MD An Emmi alert was received indicating patient responded to questions: Wife called into the [facility] office with unknown questions.  I reached out by phone to follow up on the alert and spoke to no one. Left a VM to call me back. .  Care Management Interventions: returned call. No answer. Left VM for patient to call back.   Follow up plan: will call back in 24 hours. Alan Ee, RN, BSN, CEN [employer]- Transition of Care Team.  [facility] [phone]

## 2024-03-04 NOTE — Transitions of Care (Post Inpatient/ED Visit) (Signed)
 Stroke Discharge Follow-up   03/04/2024 Name:  KAISEI GILBO MRN:  [id number] DOB:  18-Mar-1946  Subjective: ALVARO AUNGST is a 78 y.o. year old male who is a primary care patient of Charlott Dorn LABOR, MD An Emmi alert was received indicating patient responded to questions: Wife called office and wanted a nurse to call her back. Placed call to patient and patient wife's called back and states patient is constipated.  Wife states she spoke with PCP office and was told for patient to take Miralax. Wife is also concerned that PT will not start at [facility] until 03/21/2024 .  I reached out by phone to follow up on the alert and spoke to wife.   Care Management Interventions: Reviewed medications with wife. Reviewed concerns about constipation. Reviewed wife concerns about delay is PT/OT. Provided wife with contact number of Neuro rehab at [facility]. Wife states that she will call and see if patient can get in earlier in [place].   Encouraged wife to schedule an appointment with PCP ASAP.   Follow up plan: No further intervention required.   Alan Ee, RN, BSN, CEN [employer]- Transition of Care Team.  [facility] [phone]

## 2024-03-08 NOTE — Therapy (Signed)
 OUTPATIENT OCCUPATIONAL THERAPY NEURO EVALUATION  Patient Name: Kyle Kelley MRN: [id number] DOB:August 24, 1945, 78 y.o., male Today's Date: 03/08/2024  PCP: Charlott Dorn LABOR, MD REFERRING PROVIDER: Judithe Rocky BROCKS, NP  END OF SESSION:     Past Medical History:  Diagnosis Date   Aortic stenosis    Moderate AS with mean AVG 10.8, V-max 2.12m/s, SVI 28, DI 0.3   Carotid artery stenosis    1-39% bilaterally by dopplers 11/2022   Dyslipidemia    Hypertension    Inguinal hernia 2010   Small asymptomatic L inguinal Hernia   Permanent atrial fibrillation (HCC)    atrial fib    Pneumonia    hx of    Prostate cancer (HCC) 07/2012   s/p prostatectomy   PSA elevation    workup per Dr. Alm Fragmin, March 2014   Sigmoid diverticulosis    on colonoscopy in 2010   Skin cancer 02/2012   Dr Renay Homer   Tobacco dependence    rarely smokes-3 cigs per week   Past Surgical History:  Procedure Laterality Date   CARDIOVERSION  08/05/2005   Direct Current Cardioversion   COLONOSCOPY  2010   Direct current Cardioversion  08/05/2005   PROSTATE BIOPSY  09/06/2012   Dr. Alm Fragmin   ROBOT ASSISTED LAPAROSCOPIC RADICAL PROSTATECTOMY N/A 10/31/2012   Procedure: ROBOTIC ASSISTED LAPAROSCOPIC RADICAL PROSTATECTOMY;  Surgeon: Alm GORMAN Fragmin, MD;  Location: [facility];  Service: Urology;  Laterality: N/A;   TONSILLECTOMY AND ADENOIDECTOMY     TONSILLECTOMY AND ADENOIDECTOMY     Patient Active Problem List   Diagnosis Date Noted   ICH (intracerebral hemorrhage) (HCC) 02/28/2024   Aortic stenosis    Carotid artery stenosis    Dizziness 09/02/2020   Tobacco abuse 09/02/2020   Parotid mass 09/02/2020   Atrial fibrillation (HCC)    Malignant neoplasm of prostate (HCC) 09/05/2016   Essential hypertension, benign 05/08/2013   Pure hypercholesterolemia 05/08/2013   Permanent atrial fibrillation (HCC) 03/05/2013    ONSET DATE: 02/29/2024 referral date Date of Admission: 02/27/2024 Date of  Discharge: 02/29/2024  REFERRING DIAG: R42 (ICD-10-CM) - Dizziness S06.310A (ICD-10-CM) - Intraparenchymal hematoma of brain, right, without loss of consciousness, initial encounter (HCC)  THERAPY DIAG:  No diagnosis found.  Rationale for Evaluation and Treatment: Rehabilitation  SUBJECTIVE:   SUBJECTIVE STATEMENT: Pt had arrived in wc, per PT pt does not need, spouse reported I just wasn't sure. Pt accompanied by: significant other  PERTINENT HISTORY: Pt presenting to OP OT after 02/27/24-02/29/24 hospitalization for L intramedullary ICH. Other pertinent hx includes: HTN, prostate CA, afib, HLD, CAD, hx of smoking  PRECAUTIONS: Fall  WEIGHT BEARING RESTRICTIONS: No  PAIN:  Are you having pain? No  FALLS: Has patient fallen in last 6 months? Yes. Number of falls slid out of bed after returning from hospital. Small skin tear in knee.  LIVING ENVIRONMENT: Lives with: lives with their spouse Lives in: House/apartment 1 level with a carport Stairs: Yes: External: 3 steps; on left going up Has following equipment at home: Grab bars in shower/tub  PLOF: Independent and completed yardwork, completed shopping and liked to go to [employer] for, liked shooting guns and would do some gardening and carrying his potted plants around. Now has hired assistance for [employer]. Would help spouse complete laundry tasks and make the bed. Pt also completed taking the trash out, and completed light Teaching laboratory technician work.   PATIENT GOALS: I would like to get my agility back.  OBJECTIVE:  Note: Objective measures were completed at Evaluation unless otherwise noted.  HAND DOMINANCE: Right  ADLs: Overall ADLs: Only requiring a little help with bathing Transfers/ambulation related to ADLs: Eating: Independent Grooming: Independent UB Dressing:  LB Dressing: I (seated EOB) Toileting: Independent Bathing: Some A required  Tub Shower transfers: Independent Equipment: Grab  bars  IADLs: Shopping: TD Light housekeeping: TD Meal Prep: TD Community mobility: TD Medication management: Wife sets up pill Dance movement psychotherapist and he Midwife: Independent Handwriting: no problems   MOBILITY STATUS: Independent  POSTURE COMMENTS:  rounded shoulders and forward head Sitting balance: Performs 25% or less sitting activity  ACTIVITY TOLERANCE: Activity tolerance:    UPPER EXTREMITY ROM:  WNL  Active ROM Right eval Left eval  Shoulder flexion    Shoulder abduction    Shoulder adduction    Shoulder extension    Shoulder internal rotation    Shoulder external rotation    Elbow flexion    Elbow extension    Wrist flexion    Wrist extension    Wrist ulnar deviation    Wrist radial deviation    Wrist pronation    Wrist supination    (Blank rows = not tested)  UPPER EXTREMITY MMT:  WNL with exception of L shoulder abduction  MMT Right eval Left eval  Shoulder flexion    Shoulder abduction  4  Shoulder adduction    Shoulder extension    Shoulder internal rotation    Shoulder external rotation    Middle trapezius    Lower trapezius    Elbow flexion    Elbow extension    Wrist flexion    Wrist extension    Wrist ulnar deviation    Wrist radial deviation    Wrist pronation    Wrist supination    (Blank rows = not tested)  HAND FUNCTION: Grip strength: Right: 83 lbs; Left: 75 lbs  COORDINATION: 9 Hole Peg test: Right: 35.59 sec; Left: 27.34 sec Box and Blocks:  Right 53 blocks, Left 39 blocks  SENSATION: Not tested  EDEMA: none   COGNITION: Overall cognitive status: Within functional limits for tasks assessed  VISION: Subjective report: No changes Baseline vision: Wears glasses for reading only Visual history: cataracts  VISION ASSESSMENT: WFL  Patient has difficulty with following activities due to following visual impairments: none  PERCEPTION: Not tested  PRAXIS: Not tested  OBSERVATIONS: Impaired FM  coordination in L nondominant hand, fall hx and impaired balance                                                                                                                             TREATMENT DATE: 03/11/24  Pt and spouse educated in purpose of OP OT, POC, and goals. Pt and spouse also educated in possible AE to obtain and where to obtain, see Patient Education below. Pt and spouse in agreement with plan.       PATIENT EDUCATION: Education details:  Educated in AE (tub transfer bench, long handled sponge, 3:1 BSC for seat to be placed over toilet at home) and where to obtain  Person educated: Patient and Spouse Education method: Medical illustrator Education comprehension: verbalized understanding  HOME EXERCISE PROGRAM:    GOALS: Goals reviewed with patient? Yes  SHORT TERM GOALS: Target date: 04/08/24  Pt will be independent with HEP for coordination in L hand Baseline: New to OP OT Goal status: INITIAL  2.  Pt will be independent with transfers to/from AE (3:1 BSC placed over toilet at home, tub transfer bench or shower chair) should he and spouse obtain them Baseline: Pt currently does not have these Goal status: INITIAL  3.  Pt will return to completing laundry tasks with no safety concerns Baseline: Spouse currently keeping pt from completing, has agreed to allow pt to return to this  Goal status: INITIAL  4.  Pt will don and doff UB clothing independently Baseline: Min A Goal status: INITIAL  LONG TERM GOALS: Target date: 05/06/24  Pt will demonstrate improved FM coordination by a score of at least 50 blocks in Box and Blocks Test in L hand Baseline: 39 Goal status: INITIAL  2.  Pt will demonstrate improved FM coordination by a 9HPT score of no more than 30 seconds on L hand Baseline: 35.59 seconds L hand Goal status: INITIAL  3.  Pt will demonstrate improved cardiopulmonary endurance by standing prolonged at least 20 minutes (and carrying at  least 8# for at least 5 minutes of those 20 minutes)  Baseline: New to OP OT Goal status: INITIAL    ASSESSMENT:  CLINICAL IMPRESSION: Patient is a 78 y.o. male who was seen today for occupational therapy evaluation for s/p ICH. Hx includes HTN, prostate CA, afib, HLD, CAD, hx of smoking. Patient currently presents below baseline level of functioning demonstrating functional deficits and impairments as noted below. Pt would benefit from skilled OT services in the outpatient setting to work on impairments as noted below to help pt return to PLOF as able.     PERFORMANCE DEFICITS: in functional skills including ADLs, coordination, dexterity, mobility, endurance, and cardiopulmonary status limiting function, cognitive skills including safety awareness, and psychosocial skills including environmental adaptation and routines and behaviors.   IMPAIRMENTS: are limiting patient from ADLs, IADLs, and leisure.   CO-MORBIDITIES: may have co-morbidities  that affects occupational performance. Patient will benefit from skilled OT to address above impairments and improve overall function.  MODIFICATION OR ASSISTANCE TO COMPLETE EVALUATION: No modification of tasks or assist necessary to complete an evaluation.  OT OCCUPATIONAL PROFILE AND HISTORY: Problem focused assessment: Including review of records relating to presenting problem.  CLINICAL DECISION MAKING: LOW - limited treatment options, no task modification necessary  REHAB POTENTIAL: Good  EVALUATION COMPLEXITY: Low    PLAN:  OT FREQUENCY: 1x/week  OT DURATION: 8 weeks  PLANNED INTERVENTIONS: 97168 OT Re-evaluation, 97535 self care/ADL training, 02889 therapeutic exercise, 97530 therapeutic activity, functional mobility training, patient/family education, and DME and/or AE instructions  RECOMMENDED OTHER SERVICES: None at this time  CONSULTED AND AGREED WITH PLAN OF CARE: Patient and family member/caregiver  PLAN FOR NEXT SESSION:  Coordination HEP for L hand F/u on obtaining shower chair/tub bench F/u on obtaining 3:1 BSC seat attachment over toilet at home   [facility], OT 03/08/2024, 4:52 PM

## 2024-03-08 NOTE — Therapy (Signed)
 OUTPATIENT PHYSICAL THERAPY NEURO EVALUATION   Patient Name: Kyle Kelley MRN: [id number] DOB:02/06/1946, 78 y.o., male Today's Date: 03/11/2024   PCP: Charlott Dorn LABOR, MD  REFERRING PROVIDER: Judithe Rocky BROCKS, NP  END OF SESSION:  PT End of Session - 03/11/24 1234     Visit Number 1    Number of Visits 9    Date for Recertification  04/12/24    Authorization Type MEDICARE PART A AND B    PT Start Time 1231    PT Stop Time 1315    PT Time Calculation (min) 44 min    Equipment Utilized During Treatment Gait belt    Activity Tolerance Patient tolerated treatment well    Behavior During Therapy WFL for tasks assessed/performed          Past Medical History:  Diagnosis Date   Aortic stenosis    Moderate AS with mean AVG 10.8, V-max 2.11m/s, SVI 28, DI 0.3   Carotid artery stenosis    1-39% bilaterally by dopplers 11/2022   Dyslipidemia    Hypertension    Inguinal hernia 2010   Small asymptomatic L inguinal Hernia   Permanent atrial fibrillation (HCC)    atrial fib    Pneumonia    hx of    Prostate cancer (HCC) 07/2012   s/p prostatectomy   PSA elevation    workup per Dr. Alm Fragmin, March 2014   Sigmoid diverticulosis    on colonoscopy in 2010   Skin cancer 02/2012   Dr Renay Homer   Tobacco dependence    rarely smokes-3 cigs per week   Past Surgical History:  Procedure Laterality Date   CARDIOVERSION  08/05/2005   Direct Current Cardioversion   COLONOSCOPY  2010   Direct current Cardioversion  08/05/2005   PROSTATE BIOPSY  09/06/2012   Dr. Alm Fragmin   ROBOT ASSISTED LAPAROSCOPIC RADICAL PROSTATECTOMY N/A 10/31/2012   Procedure: ROBOTIC ASSISTED LAPAROSCOPIC RADICAL PROSTATECTOMY;  Surgeon: Alm GORMAN Fragmin, MD;  Location: [facility];  Service: Urology;  Laterality: N/A;   TONSILLECTOMY AND ADENOIDECTOMY     TONSILLECTOMY AND ADENOIDECTOMY     Patient Active Problem List   Diagnosis Date Noted   ICH (intracerebral hemorrhage) (HCC) 02/28/2024   Aortic  stenosis    Carotid artery stenosis    Dizziness 09/02/2020   Tobacco abuse 09/02/2020   Parotid mass 09/02/2020   Atrial fibrillation (HCC)    Malignant neoplasm of prostate (HCC) 09/05/2016   Essential hypertension, benign 05/08/2013   Pure hypercholesterolemia 05/08/2013   Permanent atrial fibrillation (HCC) 03/05/2013    ONSET DATE: 02/29/2024  REFERRING DIAG: R42 (ICD-10-CM) - Dizziness S06.310A (ICD-10-CM) - Intraparenchymal hematoma of brain, right, without loss of consciousness, initial encounter (HCC)  THERAPY DIAG:  Unsteadiness on feet  Other abnormalities of gait and mobility  Muscle weakness (generalized)  Rationale for Evaluation and Treatment: Rehabilitation  SUBJECTIVE:  SUBJECTIVE STATEMENT: Pt in manual clinic w/c with SPC. Pt reports he normally walks at home with RW. Pt discharged from hospital on 02/29/24. Walks a few laps around the house with his RW. Has been trying to do some leg exercises at home. Wife helps him with bathing. Legs are feeling weaker. Denies any dizziness, just the other day when he stood up too quickly. Before his CVA, was totally independent and did not use an AD. No falls. Had 1 fall and landed on his L knee, did not hurt himself.   Pt accompanied by: Kyle Kelley   PERTINENT HISTORY: Presented on 02/27/2024 with gait imbalance and dizziness. 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 PMH includes prostate CA, afib, HLD, CAD.   PAIN:  Are you having pain? No  Vitals:   03/11/24 1248  BP: 126/62  Pulse: 70     PRECAUTIONS: Fall  FALLS: Has patient fallen in last 6 months? Yes. Number of falls 1  LIVING ENVIRONMENT: Lives with: lives with their spouse Lives in: House/apartment Stairs:  Yes: External: 3 steps; on left going up Has following equipment at home: Single point cane, Walker - 2 wheeled, and Grab bars  PLOF: Independent and Leisure: used to do shooting sports, doing community activities like shopping, and going out to eat   Pt's spouse has been doing everything around the house, helps with bathing/dressing   PATIENT GOALS:Improve mobility, get back to the way he was    OBJECTIVE:  Note: Objective measures were completed at Evaluation unless otherwise noted.  DIAGNOSTIC FINDINGS: MRI brain 02/28/24 IMPRESSION: 1. 7 mm acute hemorrhage involving the left dorsal medulla, corresponding with abnormality on prior CT. Minimal edema within the adjacent medulla without significant regional mass effect. No visible underlying lesion or pathologic enhancement. 2. No other acute intracranial abnormality. 3. Underlying age-related cerebral atrophy with mild chronic small vessel ischemic disease.  COGNITION: Overall cognitive status: Within functional limits for tasks assessed   SENSATION:  Pt reports some tingling in his legs from the cholesterol medication and is mild, happens intermittently  Light touch: WFL  COORDINATION: Heel to shin: WNL    POSTURE: rounded shoulders and forward head   LOWER EXTREMITY MMT:    MMT Right Eval Left Eval  Hip flexion 5 5  Hip extension    Hip abduction 5 5  Hip adduction 5 5  Hip internal rotation    Hip external rotation    Knee flexion 5 5  Knee extension 4+ 4+  Ankle dorsiflexion 5 5  Ankle plantarflexion    Ankle inversion    Ankle eversion    (Blank rows = not tested)  All tested in sitting   BED MOBILITY:  Pt denies difficulties, but has RW nearby   TRANSFERS: Sit to stand: SBA  Assistive device utilized: None     Stand to sit: SBA  Assistive device utilized: None      SBA when using BUE support, with no UE support needs CGA   GAIT: Findings: Gait Characteristics: step through pattern, decreased  stride length, and trunk flexed, Distance walked: Clinic distances , Assistive device utilized:Single point cane, Walker - 2 wheeled, and None, Level of assistance: SBA, and Comments: pt at times holding SPC instead of using it, slightly wider BOS when ambulating with no AD   FUNCTIONAL TESTS:  5 times sit to stand: 26.5 seconds with no UE support - CGA and one episode of min A  due to almost losing balance posteriorly  Timed up and go (TUG): 12.1 seconds with no AD 10 meter walk test: 12.5 seconds = 2.64 ft/sec with RW, 11.4 seconds with no AD (pt holding cane) =  2.88 ft/sec                                                                                                                               TREATMENT DATE: 03/11/24 Self-Care:   Clinical findings, POC Can try to help wife with chores at home as safely able to and to monitor fatigue level  Can use RW for outdoor community distances and can ambulate with it into clinic instead of clinic w/c, and can use SPC (properly) in the house for household distances    PATIENT EDUCATION: Education details: See self-care  Person educated: Patient and Spouse Education method: Explanation Education comprehension: verbalized understanding  HOME EXERCISE PROGRAM: Will provide at future session   GOALS: Goals reviewed with patient? Yes  SHORT TERM GOALS: ALL STGS = LTGS  LONG TERM GOALS: Target date: 04/08/2024  Pt will be independent with final HEP in order to build upon functional gains made in therapy. Baseline:  Goal status: INITIAL  2.  Pt will improve 5x sit<>stand to less than or equal to 19 sec to demonstrate improved functional strength and transfer efficiency.   Baseline: 26.5 seconds with no UE support  Goal status: INITIAL  3.  FGA to be assessed with LTG written.  Baseline:  Goal status: INITIAL  4.  Pt will ambulate at least 500' outdoors for community distances with LRAD and supervision.  Baseline:  Goal status:  INITIAL   ASSESSMENT:  CLINICAL IMPRESSION: Patient is a 78 year old male referred to Neuro OPPT for L medullary hemorrhage.   Pt's PMH is significant for: prostate CA, afib, HLD, CAD. [name] The following deficits were present during the exam: impaired balance, gait abnormalities, decr strength, posture abnormalities, decr activity tolerance. Based on 5x sit <> stand, pt is an incr risk for falls. Will perform further balance testing at next session. Pt would benefit from skilled PT to address these impairments and functional limitations to maximize functional mobility independence and decr fall risk.     OBJECTIVE IMPAIRMENTS: Abnormal gait, decreased activity tolerance, decreased balance, decreased endurance, decreased knowledge of use of DME, decreased mobility, difficulty walking, decreased strength, and postural dysfunction.   ACTIVITY LIMITATIONS: squatting, stairs, transfers, dressing, locomotion level, and caring for others  PARTICIPATION LIMITATIONS: cleaning, laundry, driving, shopping, and community activity  PERSONAL FACTORS: Past/current experiences, Time since onset of injury/illness/exacerbation, and 3+ comorbidities: prostate CA, afib, HLD, CAD.  are also affecting patient's functional outcome.   REHAB POTENTIAL: Good  CLINICAL DECISION MAKING: Evolving/moderate complexity  EVALUATION COMPLEXITY: Moderate  PLAN:  PT FREQUENCY: 2x/week  PT DURATION: 4 weeks  PLANNED INTERVENTIONS: 97164- PT Re-evaluation, 97110-Therapeutic exercises, 97530- Therapeutic activity, [id number]- Neuromuscular re-education, 97535- Self Care, 02859- Manual therapy, [id number]- Gait  training, Patient/Family education, Balance training, Stair training, and DME instructions  PLAN FOR NEXT SESSION: assess FGA and update LTG. Initiate HEP for strengthening and balance deficits found on FGA, work on gait with no AD    [place] LOISE Senate, PT 03/11/2024, 3:09 PM

## 2024-03-11 ENCOUNTER — Ambulatory Visit

## 2024-03-11 ENCOUNTER — Ambulatory Visit: Attending: Neurology | Admitting: Physical Therapy

## 2024-03-11 ENCOUNTER — Encounter: Payer: Self-pay | Admitting: Physical Therapy

## 2024-03-11 VITALS — BP 126/62 | HR 70

## 2024-03-11 DIAGNOSIS — R2681 Unsteadiness on feet: Secondary | ICD-10-CM | POA: Diagnosis not present

## 2024-03-11 DIAGNOSIS — R2689 Other abnormalities of gait and mobility: Secondary | ICD-10-CM | POA: Insufficient documentation

## 2024-03-11 DIAGNOSIS — M6281 Muscle weakness (generalized): Secondary | ICD-10-CM

## 2024-03-11 DIAGNOSIS — R278 Other lack of coordination: Secondary | ICD-10-CM | POA: Insufficient documentation

## 2024-03-11 DIAGNOSIS — R29898 Other symptoms and signs involving the musculoskeletal system: Secondary | ICD-10-CM

## 2024-03-11 DIAGNOSIS — R41840 Attention and concentration deficit: Secondary | ICD-10-CM | POA: Insufficient documentation

## 2024-03-11 DIAGNOSIS — S06310A Contusion and laceration of right cerebrum without loss of consciousness, initial encounter: Secondary | ICD-10-CM | POA: Insufficient documentation

## 2024-03-11 DIAGNOSIS — R42 Dizziness and giddiness: Secondary | ICD-10-CM | POA: Diagnosis not present

## 2024-03-13 DIAGNOSIS — I629 Nontraumatic intracranial hemorrhage, unspecified: Secondary | ICD-10-CM | POA: Diagnosis not present

## 2024-03-15 ENCOUNTER — Telehealth: Payer: Self-pay

## 2024-03-15 ENCOUNTER — Ambulatory Visit (HOSPITAL_BASED_OUTPATIENT_CLINIC_OR_DEPARTMENT_OTHER)
Admission: RE | Admit: 2024-03-15 | Discharge: 2024-03-15 | Disposition: A | Source: Ambulatory Visit | Attending: Neurology | Admitting: Radiology

## 2024-03-15 DIAGNOSIS — S06310A Contusion and laceration of right cerebrum without loss of consciousness, initial encounter: Secondary | ICD-10-CM

## 2024-03-15 DIAGNOSIS — S06340A Traumatic hemorrhage of right cerebrum without loss of consciousness, initial encounter: Secondary | ICD-10-CM | POA: Diagnosis not present

## 2024-03-15 DIAGNOSIS — I613 Nontraumatic intracerebral hemorrhage in brain stem: Secondary | ICD-10-CM

## 2024-03-15 NOTE — Transitions of Care (Post Inpatient/ED Visit) (Signed)
 Stroke Discharge Follow-up   03/15/2024 Name:  DEVONNE LALANI MRN:  [id number] DOB:  [birth year]  Subjective: WISSAM RESOR is a 78 y.o. year old male who is a primary care patient of Charlott Dorn LABOR, MD An Emmi alert was received indicating patient responded to questions: Went to follow-up appointment?. I reached out by phone to follow up on the alert and spoke to Patient and Spouse. Spouse states things are going well.  Patient is up and moving frequently using his walker.  Rehab starts on Tuesday.  CT scan scheduled for today.  Addressed red flag.  Spouse states that patient has a follow up at PCP office on Wednesday and is set to see PCP again in January if no other needs.    Care Management Interventions: Reviewed dashboard, Outreach to patient, Discussed MD follow up and rehab  Follow up plan: No further intervention required.   Maiah Sinning J. Lafayette Dunlevy RN, MSN [facility], [facility] RN Care Manager Direct Dial: [phone]  Fax: [phone] Website: [email or web address]

## 2024-03-19 ENCOUNTER — Encounter: Payer: Self-pay | Admitting: Physical Therapy

## 2024-03-19 ENCOUNTER — Ambulatory Visit: Admitting: Physical Therapy

## 2024-03-19 VITALS — BP 131/62 | HR 62

## 2024-03-19 DIAGNOSIS — R41840 Attention and concentration deficit: Secondary | ICD-10-CM | POA: Diagnosis not present

## 2024-03-19 DIAGNOSIS — M6281 Muscle weakness (generalized): Secondary | ICD-10-CM | POA: Diagnosis not present

## 2024-03-19 DIAGNOSIS — R29898 Other symptoms and signs involving the musculoskeletal system: Secondary | ICD-10-CM

## 2024-03-19 DIAGNOSIS — R2681 Unsteadiness on feet: Secondary | ICD-10-CM

## 2024-03-19 DIAGNOSIS — R2689 Other abnormalities of gait and mobility: Secondary | ICD-10-CM | POA: Diagnosis not present

## 2024-03-19 DIAGNOSIS — R278 Other lack of coordination: Secondary | ICD-10-CM

## 2024-03-19 NOTE — Therapy (Signed)
 OUTPATIENT PHYSICAL THERAPY NEURO TREATMENT   Patient Name: Kyle Kelley MRN: [id number] DOB:June 11, 1945, 78 y.o., male Today's Date: 03/19/2024   PCP: Charlott Dorn LABOR, MD  REFERRING PROVIDER: Judithe Rocky BROCKS, NP  END OF SESSION:  PT End of Session - 03/19/24 1318     Visit Number 2    Number of Visits 9    Date for Recertification  04/12/24    Authorization Type MEDICARE PART A AND B    PT Start Time 1316    PT Stop Time 1356    PT Time Calculation (min) 40 min    Equipment Utilized During Treatment Gait belt    Activity Tolerance Patient tolerated treatment well    Behavior During Therapy WFL for tasks assessed/performed          Past Medical History:  Diagnosis Date   Aortic stenosis    Moderate AS with mean AVG 10.8, V-max 2.75m/s, SVI 28, DI 0.3   Carotid artery stenosis    1-39% bilaterally by dopplers 11/2022   Dyslipidemia    Hypertension    Inguinal hernia 2010   Small asymptomatic L inguinal Hernia   Permanent atrial fibrillation (HCC)    atrial fib    Pneumonia    hx of    Prostate cancer (HCC) 07/2012   s/p prostatectomy   PSA elevation    workup per Dr. Alm Fragmin, March 2014   Sigmoid diverticulosis    on colonoscopy in 2010   Skin cancer 02/2012   Dr Renay Homer   Tobacco dependence    rarely smokes-3 cigs per week   Past Surgical History:  Procedure Laterality Date   CARDIOVERSION  08/05/2005   Direct Current Cardioversion   COLONOSCOPY  2010   Direct current Cardioversion  08/05/2005   PROSTATE BIOPSY  09/06/2012   Dr. Alm Fragmin   ROBOT ASSISTED LAPAROSCOPIC RADICAL PROSTATECTOMY N/A 10/31/2012   Procedure: ROBOTIC ASSISTED LAPAROSCOPIC RADICAL PROSTATECTOMY;  Surgeon: Alm GORMAN Fragmin, MD;  Location: [facility];  Service: Urology;  Laterality: N/A;   TONSILLECTOMY AND ADENOIDECTOMY     TONSILLECTOMY AND ADENOIDECTOMY     Patient Active Problem List   Diagnosis Date Noted   ICH (intracerebral hemorrhage) (HCC) 02/28/2024   Aortic  stenosis    Carotid artery stenosis    Dizziness 09/02/2020   Tobacco abuse 09/02/2020   Parotid mass 09/02/2020   Atrial fibrillation (HCC)    Malignant neoplasm of prostate (HCC) 09/05/2016   Essential hypertension, benign 05/08/2013   Pure hypercholesterolemia 05/08/2013   Permanent atrial fibrillation (HCC) 03/05/2013    ONSET DATE: 02/29/2024  REFERRING DIAG: R42 (ICD-10-CM) - Dizziness S06.310A (ICD-10-CM) - Intraparenchymal hematoma of brain, right, without loss of consciousness, initial encounter (HCC)  THERAPY DIAG:  Other lack of coordination  Muscle weakness (generalized)  Other symptoms and signs involving the musculoskeletal system  Unsteadiness on feet  Rationale for Evaluation and Treatment: Rehabilitation  SUBJECTIVE:  SUBJECTIVE STATEMENT: Has been able to do more around the house. Feels like his coordination is getting better. No falls. Ambulates in with RW today (but not really holding onto it anyways). Not using any AD in the house.   Pt accompanied by: [name]   PERTINENT HISTORY: Presented on 02/27/2024 with gait imbalance and dizziness. 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 PMH includes prostate CA, afib, HLD, CAD.   PAIN:  Are you having pain? No  Vitals:   03/19/24 1322  BP: 131/62  Pulse: 62      PRECAUTIONS: Fall  FALLS: Has patient fallen in last 6 months? Yes. Number of falls 1  LIVING ENVIRONMENT: Lives with: lives with their spouse Lives in: House/apartment Stairs: Yes: External: 3 steps; on left going up Has following equipment at home: Single point cane, Walker - 2 wheeled, and Grab bars  PLOF: Independent and Leisure: used to do shooting sports, doing community activities like  shopping, and going out to eat   Pt's spouse has been doing everything around the house, helps with bathing/dressing   PATIENT GOALS:Improve mobility, get back to the way he was    OBJECTIVE:  Note: Objective measures were completed at Evaluation unless otherwise noted.  DIAGNOSTIC FINDINGS: MRI brain 02/28/24 IMPRESSION: 1. 7 mm acute hemorrhage involving the left dorsal medulla, corresponding with abnormality on prior CT. Minimal edema within the adjacent medulla without significant regional mass effect. No visible underlying lesion or pathologic enhancement. 2. No other acute intracranial abnormality. 3. Underlying age-related cerebral atrophy with mild chronic small vessel ischemic disease.  COGNITION: Overall cognitive status: Within functional limits for tasks assessed   SENSATION:  Pt reports some tingling in his legs from the cholesterol medication and is mild, happens intermittently  Light touch: WFL  COORDINATION: Heel to shin: WNL    POSTURE: rounded shoulders and forward head   LOWER EXTREMITY MMT:    MMT Right Eval Left Eval  Hip flexion 5 5  Hip extension    Hip abduction 5 5  Hip adduction 5 5  Hip internal rotation    Hip external rotation    Knee flexion 5 5  Knee extension 4+ 4+  Ankle dorsiflexion 5 5  Ankle plantarflexion    Ankle inversion    Ankle eversion    (Blank rows = not tested)  All tested in sitting   BED MOBILITY:  Pt denies difficulties, but has RW nearby   TRANSFERS: Sit to stand: SBA  Assistive device utilized: None     Stand to sit: SBA  Assistive device utilized: None      SBA when using BUE support, with no UE support needs CGA   GAIT: Findings: Gait Characteristics: step through pattern, decreased stride length, and trunk flexed, Distance walked: Clinic distances , Assistive device utilized:Single point cane, Walker - 2 wheeled, and None, Level of assistance: SBA, and Comments: pt at times holding SPC instead of using  it, slightly wider BOS when ambulating with no AD   FUNCTIONAL TESTS:  5 times sit to stand: 26.5 seconds with no UE support - CGA and one episode of min A due to almost losing balance posteriorly  Timed up and go (TUG): 12.1 seconds with no AD 10 meter walk test: 12.5 seconds = 2.64 ft/sec with RW, 11.4 seconds with no AD (pt holding cane) =  2.88 ft/sec  TREATMENT DATE: 03/19/24   Therapeutic Activity:  Vitals:   03/19/24 1322  BP: 131/62  Pulse: 62   Pt ambulated into clinic with RW mod I with pt barely using it by just having his fingertips on the RW and picking it up sometimes.  Ambulated during session with no AD and supervision.    [facility] PT Assessment - 03/19/24 1323       Functional Gait  Assessment   Gait assessed  Yes    Gait Level Surface Walks 20 ft in less than 7 sec but greater than 5.5 sec, uses assistive device, slower speed, mild gait deviations, or deviates 6-10 in outside of the 12 in walkway width.   6.9 seconds   Change in Gait Speed Able to smoothly change walking speed without loss of balance or gait deviation. Deviate no more than 6 in outside of the 12 in walkway width.    Gait with Horizontal Head Turns Performs head turns smoothly with slight change in gait velocity (eg, minor disruption to smooth gait path), deviates 6-10 in outside 12 in walkway width, or uses an assistive device.    Gait with Vertical Head Turns Performs task with slight change in gait velocity (eg, minor disruption to smooth gait path), deviates 6 - 10 in outside 12 in walkway width or uses assistive device    Gait and Pivot Turn Pivot turns safely within 3 sec and stops quickly with no loss of balance.    Step Over Obstacle Is able to step over one shoe box (4.5 in total height) without changing gait speed. No evidence of imbalance.    Gait with Narrow Base of  Support Ambulates less than 4 steps heel to toe or cannot perform without assistance.    Gait with Eyes Closed Walks 20 ft, uses assistive device, slower speed, mild gait deviations, deviates 6-10 in outside 12 in walkway width. Ambulates 20 ft in less than 9 sec but greater than 7 sec.   7.2 seconds   Ambulating Backwards Walks 20 ft, uses assistive device, slower speed, mild gait deviations, deviates 6-10 in outside 12 in walkway width.   17.4 seconds   Steps Alternating feet, must use rail.    Total Score 20    FGA comment: 20/30 = Moderate Fall Risk          Access Code: [email or web address] URL: [email or web address] Date: 03/19/2024 Prepared by: Sheffield Senate  Initiated HEP for strength/balance:  Educated pt and pt's spouse regarding purpose of exercises and reviewed reps and sets   Exercises - Sit to Stand with Arms Crossed  - 1-2 x daily - 4-5 x weekly - 2 sets - 10 reps - Tandem Walking with Counter Support  - 1-2 x daily - 4-5 x weekly - 4 sets - Standing Marching  - 1-2 x daily - 4-5 x weekly - 4 sets - Wide Stance with Eyes Closed on Foam Pad  - 1-2 x daily - 4-5 x weekly - 3 sets - 30 hold - Standing with Head Rotation  - 1-2 x daily - 4-5 x weekly - 2 sets - 10 reps - standing on foam    PATIENT EDUCATION: Education details: Results of FGA, initial HEP, discussed can bring in [facility] to therapy instead of RW  Person educated: Patient and Spouse Education method: Explanation, Demonstration, Verbal cues, and Handouts Education comprehension: verbalized understanding, returned demonstration, and needs further education  HOME EXERCISE PROGRAM: Access Code: [email or web address] URL: [email or web address] Date: 03/19/2024 Prepared by: Sheffield  Cashel Bellina  Exercises - Sit to Stand with Arms Crossed  - 1-2 x daily - 4-5 x weekly - 2 sets - 10 reps - Tandem Walking with Counter Support  - 1-2 x daily - 4-5 x weekly - 4 sets - Standing Marching  - 1-2 x daily - 4-5 x  weekly - 4 sets - Wide Stance with Eyes Closed on Foam Pad  - 1-2 x daily - 4-5 x weekly - 3 sets - 30 hold - Standing with Head Rotation  - 1-2 x daily - 4-5 x weekly - 2 sets - 10 reps  GOALS: Goals reviewed with patient? Yes  SHORT TERM GOALS: ALL STGS = LTGS  LONG TERM GOALS: Target date: 04/08/2024  Pt will be independent with final HEP in order to build upon functional gains made in therapy. Baseline:  Goal status: INITIAL  2.  Pt will improve 5x sit<>stand to less than or equal to 19 sec to demonstrate improved functional strength and transfer efficiency.   Baseline: 26.5 seconds with no UE support  Goal status: INITIAL  3.  Pt will improve FGA to at least a 26/30 in order to demo decr fall risk.   Baseline: 20/30 Goal status: INITIAL  4.  Pt will ambulate at least 500' outdoors for community distances with LRAD and supervision.  Baseline:  Goal status: INITIAL   ASSESSMENT:  CLINICAL IMPRESSION: Assessed FGA with pt scoring a 20/30, indicating a moderate risk for falls. LTG updated as appropriate. Remainder of session focused on initiating HEP for functional strength and corner/countertop balance. Pt demonstrating more postural sway with exercises with head nods compared to head turns. Pt ambulating during session with no AD and supervision. Will continue per POC.     OBJECTIVE IMPAIRMENTS: Abnormal gait, decreased activity tolerance, decreased balance, decreased endurance, decreased knowledge of use of DME, decreased mobility, difficulty walking, decreased strength, and postural dysfunction.   ACTIVITY LIMITATIONS: squatting, stairs, transfers, dressing, locomotion level, and caring for others  PARTICIPATION LIMITATIONS: cleaning, laundry, driving, shopping, and community activity  PERSONAL FACTORS: Past/current experiences, Time since onset of injury/illness/exacerbation, and 3+ comorbidities: prostate CA, afib, HLD, CAD.  are also affecting patient's functional  outcome.   REHAB POTENTIAL: Good  CLINICAL DECISION MAKING: Evolving/moderate complexity  EVALUATION COMPLEXITY: Moderate  PLAN:  PT FREQUENCY: 2x/week  PT DURATION: 4 weeks  PLANNED INTERVENTIONS: 97164- PT Re-evaluation, 97110-Therapeutic exercises, 97530- Therapeutic activity, 97112- Neuromuscular re-education, 97535- Self Care, [id number]- Manual therapy, [phone]- Gait training, Patient/Family education, Balance training, Stair training, and DME instructions  PLAN FOR NEXT SESSION: work on dynamic gait tasks with no AD, functional strengthening, SLS, balance on unlevel surfaces    Sheffield LOISE Senate, PT, DPT 03/19/2024, 2:49 PM

## 2024-03-21 ENCOUNTER — Ambulatory Visit

## 2024-03-21 VITALS — BP 145/68 | HR 61

## 2024-03-21 DIAGNOSIS — R41840 Attention and concentration deficit: Secondary | ICD-10-CM | POA: Diagnosis not present

## 2024-03-21 DIAGNOSIS — R2689 Other abnormalities of gait and mobility: Secondary | ICD-10-CM | POA: Diagnosis not present

## 2024-03-21 DIAGNOSIS — R278 Other lack of coordination: Secondary | ICD-10-CM | POA: Diagnosis not present

## 2024-03-21 DIAGNOSIS — R29898 Other symptoms and signs involving the musculoskeletal system: Secondary | ICD-10-CM | POA: Diagnosis not present

## 2024-03-21 DIAGNOSIS — M6281 Muscle weakness (generalized): Secondary | ICD-10-CM

## 2024-03-21 DIAGNOSIS — R2681 Unsteadiness on feet: Secondary | ICD-10-CM | POA: Diagnosis not present

## 2024-03-21 NOTE — Therapy (Addendum)
 OUTPATIENT PHYSICAL THERAPY NEURO TREATMENT   Patient Name: Kyle Kelley MRN: [id number] DOB:May 06, 1946, 78 y.o., male Today's Date: 03/21/2024   PCP: Charlott Dorn LABOR, MD  REFERRING PROVIDER: Judithe Rocky BROCKS, NP  END OF SESSION:  PT End of Session - 03/21/24 1157     Visit Number 3    Number of Visits 9    Date for Recertification  04/12/24    Authorization Type MEDICARE PART A AND B    PT Start Time 1138    PT Stop Time 1220    PT Time Calculation (min) 42 min    Equipment Utilized During Treatment Gait belt    Activity Tolerance Patient tolerated treatment well    Behavior During Therapy WFL for tasks assessed/performed;Impulsive           Past Medical History:  Diagnosis Date   Aortic stenosis    Moderate AS with mean AVG 10.8, V-max 2.55m/s, SVI 28, DI 0.3   Carotid artery stenosis    1-39% bilaterally by dopplers 11/2022   Dyslipidemia    Hypertension    Inguinal hernia 2010   Small asymptomatic L inguinal Hernia   Permanent atrial fibrillation (HCC)    atrial fib    Pneumonia    hx of    Prostate cancer (HCC) 07/2012   s/p prostatectomy   PSA elevation    workup per Dr. Alm Fragmin, March 2014   Sigmoid diverticulosis    on colonoscopy in 2010   Skin cancer 02/2012   Dr Renay Homer   Tobacco dependence    rarely smokes-3 cigs per week   Past Surgical History:  Procedure Laterality Date   CARDIOVERSION  08/05/2005   Direct Current Cardioversion   COLONOSCOPY  2010   Direct current Cardioversion  08/05/2005   PROSTATE BIOPSY  09/06/2012   Dr. Alm Fragmin   ROBOT ASSISTED LAPAROSCOPIC RADICAL PROSTATECTOMY N/A 10/31/2012   Procedure: ROBOTIC ASSISTED LAPAROSCOPIC RADICAL PROSTATECTOMY;  Surgeon: Alm GORMAN Fragmin, MD;  Location: [facility];  Service: Urology;  Laterality: N/A;   TONSILLECTOMY AND ADENOIDECTOMY     TONSILLECTOMY AND ADENOIDECTOMY     Patient Active Problem List   Diagnosis Date Noted   ICH (intracerebral hemorrhage) (HCC) 02/28/2024    Aortic stenosis    Carotid artery stenosis    Dizziness 09/02/2020   Tobacco abuse 09/02/2020   Parotid mass 09/02/2020   Atrial fibrillation (HCC)    Malignant neoplasm of prostate (HCC) 09/05/2016   Essential hypertension, benign 05/08/2013   Pure hypercholesterolemia 05/08/2013   Permanent atrial fibrillation (HCC) 03/05/2013    ONSET DATE: 02/29/2024  REFERRING DIAG: R42 (ICD-10-CM) - Dizziness S06.310A (ICD-10-CM) - Intraparenchymal hematoma of brain, right, without loss of consciousness, initial encounter (HCC)  THERAPY DIAG:  Other lack of coordination  Muscle weakness (generalized)  Other symptoms and signs involving the musculoskeletal system  Unsteadiness on feet  Rationale for Evaluation and Treatment: Rehabilitation  SUBJECTIVE:  SUBJECTIVE STATEMENT: Patient ambulates in without AD and accompanied by spouse. Patient states feeling good and a little sore since last session and after completing the home exercise. Patient denies falls.  Pt accompanied by: [name]   PERTINENT HISTORY: Presented on 02/27/2024 with gait imbalance and dizziness. 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 PMH includes prostate CA, afib, HLD, CAD.   PAIN:  Are you having pain? No  Vitals:   03/21/24 1130  BP: (!) 145/68  Pulse: 61       PRECAUTIONS: Fall  FALLS: Has patient fallen in last 6 months? Yes. Number of falls 1  LIVING ENVIRONMENT: Lives with: lives with their spouse Lives in: House/apartment Stairs: Yes: External: 3 steps; on left going up Has following equipment at home: Single point cane, Walker - 2 wheeled, and Grab bars  PLOF: Independent and Leisure: used to do shooting sports, doing community activities like  shopping, and going out to eat   Pt's spouse has been doing everything around the house, helps with bathing/dressing   PATIENT GOALS:Improve mobility, get back to the way he was    OBJECTIVE:  Note: Objective measures were completed at Evaluation unless otherwise noted.  DIAGNOSTIC FINDINGS: MRI brain 02/28/24 IMPRESSION: 1. 7 mm acute hemorrhage involving the left dorsal medulla, corresponding with abnormality on prior CT. Minimal edema within the adjacent medulla without significant regional mass effect. No visible underlying lesion or pathologic enhancement. 2. No other acute intracranial abnormality. 3. Underlying age-related cerebral atrophy with mild chronic small vessel ischemic disease.  COGNITION: Overall cognitive status: Within functional limits for tasks assessed   SENSATION:  Pt reports some tingling in his legs from the cholesterol medication and is mild, happens intermittently  Light touch: WFL  COORDINATION: Heel to shin: WNL    POSTURE: rounded shoulders and forward head   LOWER EXTREMITY MMT:    MMT Right Eval Left Eval  Hip flexion 5 5  Hip extension    Hip abduction 5 5  Hip adduction 5 5  Hip internal rotation    Hip external rotation    Knee flexion 5 5  Knee extension 4+ 4+  Ankle dorsiflexion 5 5  Ankle plantarflexion    Ankle inversion    Ankle eversion    (Blank rows = not tested)  All tested in sitting   BED MOBILITY:  Pt denies difficulties, but has RW nearby   TRANSFERS: Sit to stand: SBA  Assistive device utilized: None     Stand to sit: SBA  Assistive device utilized: None      SBA when using BUE support, with no UE support needs CGA   GAIT: Findings: Gait Characteristics: step through pattern, decreased stride length, and trunk flexed, Distance walked: Clinic distances , Assistive device utilized:Single point cane, Walker - 2 wheeled, and None, Level of assistance: SBA, and Comments: pt at times holding SPC instead of using  it, slightly wider BOS when ambulating with no AD   FUNCTIONAL TESTS:  5 times sit to stand: 26.5 seconds with no UE support - CGA and one episode of min A due to almost losing balance posteriorly  Timed up and go (TUG): 12.1 seconds with no AD 10 meter walk test: 12.5 seconds = 2.64 ft/sec with RW, 11.4 seconds with no AD (pt holding cane) =  2.88 ft/sec  TREATMENT DATE: 03/21/24   Therapeutic Activity:  Vitals:   03/21/24 1130  BP: (!) 145/68  Pulse: 61    - Steps, ascend and descend x 1 - Steps, alternating step ups, 2 sets x 20 total, Min assist needed through with some LOB and correction needed - Pt ambulated into clinic with RW mod I with pt barely using it by just having his fingertips on the RW and picking it up sometimes.  - Ambulated during session with no AD and SBA  NMR: - Tandem walking in parallel bars, down and back - Semi-tandem on foam with horizontal head turns x 20 seconds on each foot - Foam balance beam side stepping in parallel bars, down and back x 2 sets, Min assist - Foam balance beam up and down side stepping, down and back x 4 sets, Min assist - Blaze pods (set wide apart and high) step to reach, 2 sets x 1 minute each, Min assist - Blaze pod reaching on foam rhomberg, 3 sets x 1 min each, min assist - Marching on foam x 30 seconds, some UE touch, min assist *Rest breaks for fatigue, good endurance and no signs of distress with min-mod levels of LE fatigue  *Included memory tasks such as remembering what foot to step with during alternating step ups and during foam balance beam walking.   HEP:  Access Code: [email or web address] URL: [email or web address] Date: 03/19/2024 Prepared by: Sheffield Senate  Initiated HEP for strength/balance:  Educated pt and pt's spouse regarding purpose of exercises and reviewed reps and sets    Exercises - Sit to Stand with Arms Crossed  - 1-2 x daily - 4-5 x weekly - 2 sets - 10 reps - Tandem Walking with Counter Support  - 1-2 x daily - 4-5 x weekly - 4 sets - Standing Marching  - 1-2 x daily - 4-5 x weekly - 4 sets - Wide Stance with Eyes Closed on Foam Pad  - 1-2 x daily - 4-5 x weekly - 3 sets - 30 hold - Standing with Head Rotation  - 1-2 x daily - 4-5 x weekly - 2 sets - 10 reps - standing on foam    PATIENT EDUCATION: Education details: Educated on railing use during stair negotiation to decrease risk of falls. And educated that driving determination is completed by physician and that PT doesn't determine this.  Person educated: Patient and Spouse Education method: Explanation, Demonstration, Verbal cues, and Handouts Education comprehension: needs further education  HOME EXERCISE PROGRAM: Access Code: [email or web address] URL: [email or web address] Date: 03/19/2024 Prepared by: Sheffield Senate  Exercises - Sit to Stand with Arms Crossed  - 1-2 x daily - 4-5 x weekly - 2 sets - 10 reps - Tandem Walking with Counter Support  - 1-2 x daily - 4-5 x weekly - 4 sets - Standing Marching  - 1-2 x daily - 4-5 x weekly - 4 sets - Wide Stance with Eyes Closed on Foam Pad  - 1-2 x daily - 4-5 x weekly - 3 sets - 30 hold - Standing with Head Rotation  - 1-2 x daily - 4-5 x weekly - 2 sets - 10 reps  GOALS: Goals reviewed with patient? Yes  SHORT TERM GOALS: ALL STGS = LTGS  LONG TERM GOALS: Target date: 04/08/2024  Pt will be independent with final HEP in order to build upon functional gains made in therapy. Baseline:  Goal status: INITIAL  2.  Pt will improve 5x sit<>stand to less than or equal  to 19 sec to demonstrate improved functional strength and transfer efficiency.   Baseline: 26.5 seconds with no UE support  Goal status: INITIAL  3.  Pt will improve FGA to at least a 26/30 in order to demo decr fall risk.   Baseline: 20/30 Goal status: INITIAL  4.   Pt will ambulate at least 500' outdoors for community distances with LRAD and supervision.  Baseline:  Goal status: INITIAL   ASSESSMENT:  CLINICAL IMPRESSION: Patient tolerated session well with rest needed throughout due to LE fatigue. Balance and LE strengthening continued with emphasis on dynamic balance on compliant surfaces and cognitive tasks included to improve dual asking. LE strengthening was well tolerated with emphasis on stair negotiation and use of rails for safety and some fatigue noted at the end on session. Min assist is needed during balance and strength tasks with some correction needed for loss of balance during step ups and compliant surface exercises. Education in using cart at grocery and using railings during stair negotiation. Pt ambulating during session with no AD and supervision. Will continue per POC.     OBJECTIVE IMPAIRMENTS: Abnormal gait, decreased activity tolerance, decreased balance, decreased endurance, decreased knowledge of use of DME, decreased mobility, difficulty walking, decreased strength, and postural dysfunction.   ACTIVITY LIMITATIONS: squatting, stairs, transfers, dressing, locomotion level, and caring for others  PARTICIPATION LIMITATIONS: cleaning, laundry, driving, shopping, and community activity  PERSONAL FACTORS: Past/current experiences, Time since onset of injury/illness/exacerbation, and 3+ comorbidities: prostate CA, afib, HLD, CAD.  are also affecting patient's functional outcome.   REHAB POTENTIAL: Good  CLINICAL DECISION MAKING: Evolving/moderate complexity  EVALUATION COMPLEXITY: Moderate  PLAN:  PT FREQUENCY: 2x/week  PT DURATION: 4 weeks  PLANNED INTERVENTIONS: 97164- PT Re-evaluation, 97110-Therapeutic exercises, 97530- Therapeutic activity, 97112- Neuromuscular re-education, 97535- Self Care, 02859- Manual therapy, [phone]- Gait training, Patient/Family education, Balance training, Stair training, and DME  instructions  PLAN FOR NEXT SESSION: work on dynamic gait tasks with no AD, functional strengthening, SLS, balance on unlevel surfaces, dual tasking, moving to reach target tasks    [employer], Student-PT 03/21/2024, 12:23 PM

## 2024-03-25 ENCOUNTER — Encounter (HOSPITAL_COMMUNITY): Payer: Self-pay

## 2024-03-25 ENCOUNTER — Other Ambulatory Visit: Payer: Self-pay

## 2024-03-25 ENCOUNTER — Emergency Department (HOSPITAL_COMMUNITY)

## 2024-03-25 ENCOUNTER — Inpatient Hospital Stay (HOSPITAL_COMMUNITY)
Admission: EM | Admit: 2024-03-25 | Discharge: 2024-03-28 | DRG: 263 | Disposition: A | Discharge status: Home / Self Care | Attending: Internal Medicine | Admitting: Internal Medicine

## 2024-03-25 ENCOUNTER — Encounter: Payer: Self-pay | Admitting: Neurology

## 2024-03-25 ENCOUNTER — Ambulatory Visit: Admitting: Physical Therapy

## 2024-03-25 ENCOUNTER — Ambulatory Visit

## 2024-03-25 ENCOUNTER — Encounter (HOSPITAL_COMMUNITY)
Admission: EM | Disposition: A | Discharge status: Home / Self Care | Payer: Self-pay | Source: Home / Self Care | Attending: Internal Medicine

## 2024-03-25 ENCOUNTER — Encounter: Payer: Self-pay | Admitting: Physical Therapy

## 2024-03-25 ENCOUNTER — Ambulatory Visit: Admitting: Neurology

## 2024-03-25 VITALS — BP 150/63 | HR 66

## 2024-03-25 VITALS — BP 146/79 | HR 81 | Ht 73.0 in | Wt 156.4 lb

## 2024-03-25 DIAGNOSIS — I350 Nonrheumatic aortic (valve) stenosis: Secondary | ICD-10-CM | POA: Diagnosis present

## 2024-03-25 DIAGNOSIS — Z9189 Other specified personal risk factors, not elsewhere classified: Secondary | ICD-10-CM

## 2024-03-25 DIAGNOSIS — C61 Malignant neoplasm of prostate: Secondary | ICD-10-CM | POA: Diagnosis present

## 2024-03-25 DIAGNOSIS — I4821 Permanent atrial fibrillation: Secondary | ICD-10-CM | POA: Diagnosis present

## 2024-03-25 DIAGNOSIS — Z9079 Acquired absence of other genital organ(s): Secondary | ICD-10-CM

## 2024-03-25 DIAGNOSIS — E860 Dehydration: Secondary | ICD-10-CM | POA: Diagnosis present

## 2024-03-25 DIAGNOSIS — R41840 Attention and concentration deficit: Secondary | ICD-10-CM

## 2024-03-25 DIAGNOSIS — Z9889 Other specified postprocedural states: Secondary | ICD-10-CM

## 2024-03-25 DIAGNOSIS — Z85828 Personal history of other malignant neoplasm of skin: Secondary | ICD-10-CM | POA: Diagnosis not present

## 2024-03-25 DIAGNOSIS — I743 Embolism and thrombosis of arteries of the lower extremities: Secondary | ICD-10-CM | POA: Diagnosis present

## 2024-03-25 DIAGNOSIS — I82432 Acute embolism and thrombosis of left popliteal vein: Secondary | ICD-10-CM | POA: Diagnosis not present

## 2024-03-25 DIAGNOSIS — Z8673 Personal history of transient ischemic attack (TIA), and cerebral infarction without residual deficits: Secondary | ICD-10-CM | POA: Insufficient documentation

## 2024-03-25 DIAGNOSIS — I482 Chronic atrial fibrillation, unspecified: Secondary | ICD-10-CM

## 2024-03-25 DIAGNOSIS — Z8249 Family history of ischemic heart disease and other diseases of the circulatory system: Secondary | ICD-10-CM | POA: Diagnosis not present

## 2024-03-25 DIAGNOSIS — R262 Difficulty in walking, not elsewhere classified: Secondary | ICD-10-CM | POA: Diagnosis not present

## 2024-03-25 DIAGNOSIS — E782 Mixed hyperlipidemia: Secondary | ICD-10-CM | POA: Diagnosis present

## 2024-03-25 DIAGNOSIS — Z888 Allergy status to other drugs, medicaments and biological substances status: Secondary | ICD-10-CM

## 2024-03-25 DIAGNOSIS — I613 Nontraumatic intracerebral hemorrhage in brain stem: Secondary | ICD-10-CM

## 2024-03-25 DIAGNOSIS — M6281 Muscle weakness (generalized): Secondary | ICD-10-CM

## 2024-03-25 DIAGNOSIS — Z91030 Bee allergy status: Secondary | ICD-10-CM | POA: Insufficient documentation

## 2024-03-25 DIAGNOSIS — D696 Thrombocytopenia, unspecified: Secondary | ICD-10-CM | POA: Diagnosis not present

## 2024-03-25 DIAGNOSIS — Z79899 Other long term (current) drug therapy: Secondary | ICD-10-CM

## 2024-03-25 DIAGNOSIS — E875 Hyperkalemia: Secondary | ICD-10-CM | POA: Diagnosis present

## 2024-03-25 DIAGNOSIS — F17210 Nicotine dependence, cigarettes, uncomplicated: Secondary | ICD-10-CM | POA: Diagnosis present

## 2024-03-25 DIAGNOSIS — E871 Hypo-osmolality and hyponatremia: Secondary | ICD-10-CM | POA: Diagnosis present

## 2024-03-25 DIAGNOSIS — Z7902 Long term (current) use of antithrombotics/antiplatelets: Secondary | ICD-10-CM

## 2024-03-25 DIAGNOSIS — I998 Other disorder of circulatory system: Secondary | ICD-10-CM | POA: Diagnosis not present

## 2024-03-25 DIAGNOSIS — Z7901 Long term (current) use of anticoagulants: Secondary | ICD-10-CM | POA: Diagnosis not present

## 2024-03-25 DIAGNOSIS — I7777 Dissection of artery of lower extremity: Secondary | ICD-10-CM | POA: Diagnosis not present

## 2024-03-25 DIAGNOSIS — R2681 Unsteadiness on feet: Secondary | ICD-10-CM

## 2024-03-25 DIAGNOSIS — I82812 Embolism and thrombosis of superficial veins of left lower extremities: Secondary | ICD-10-CM | POA: Diagnosis not present

## 2024-03-25 DIAGNOSIS — D649 Anemia, unspecified: Secondary | ICD-10-CM | POA: Diagnosis not present

## 2024-03-25 DIAGNOSIS — Z8546 Personal history of malignant neoplasm of prostate: Secondary | ICD-10-CM | POA: Diagnosis not present

## 2024-03-25 DIAGNOSIS — R278 Other lack of coordination: Secondary | ICD-10-CM

## 2024-03-25 DIAGNOSIS — I10 Essential (primary) hypertension: Secondary | ICD-10-CM | POA: Diagnosis present

## 2024-03-25 DIAGNOSIS — R531 Weakness: Secondary | ICD-10-CM | POA: Diagnosis not present

## 2024-03-25 DIAGNOSIS — I619 Nontraumatic intracerebral hemorrhage, unspecified: Secondary | ICD-10-CM | POA: Diagnosis present

## 2024-03-25 DIAGNOSIS — R29898 Other symptoms and signs involving the musculoskeletal system: Secondary | ICD-10-CM

## 2024-03-25 DIAGNOSIS — I70222 Atherosclerosis of native arteries of extremities with rest pain, left leg: Principal | ICD-10-CM | POA: Diagnosis present

## 2024-03-25 DIAGNOSIS — I4891 Unspecified atrial fibrillation: Secondary | ICD-10-CM | POA: Diagnosis not present

## 2024-03-25 DIAGNOSIS — Z8679 Personal history of other diseases of the circulatory system: Secondary | ICD-10-CM | POA: Diagnosis not present

## 2024-03-25 DIAGNOSIS — I70229 Atherosclerosis of native arteries of extremities with rest pain, unspecified extremity: Secondary | ICD-10-CM | POA: Diagnosis present

## 2024-03-25 DIAGNOSIS — R2689 Other abnormalities of gait and mobility: Secondary | ICD-10-CM

## 2024-03-25 HISTORY — PX: THROMBECTOMY FEMORAL ARTERY: SHX6406

## 2024-03-25 HISTORY — PX: LOWER EXTREMITY ANGIOGRAM: SHX5508

## 2024-03-25 HISTORY — PX: ENDARTERECTOMY POPLITEAL: SHX5806

## 2024-03-25 HISTORY — PX: VEIN HARVEST: SHX6363

## 2024-03-25 LAB — I-STAT CG4 LACTIC ACID, ED: Lactic Acid, Venous: 2.6 mmol/L (ref 0.5–1.9)

## 2024-03-25 LAB — CBC WITH DIFFERENTIAL/PLATELET
Abs Immature Granulocytes: 0.04 K/uL (ref 0.00–0.07)
Basophils Absolute: 0 K/uL (ref 0.0–0.1)
Basophils Relative: 0 %
Eosinophils Absolute: 0 K/uL (ref 0.0–0.5)
Eosinophils Relative: 0 %
HCT: 44.2 % (ref 39.0–52.0)
Hemoglobin: 15 g/dL (ref 13.0–17.0)
Immature Granulocytes: 0 %
Lymphocytes Relative: 7 %
Lymphs Abs: 0.7 K/uL (ref 0.7–4.0)
MCH: 30.9 pg (ref 26.0–34.0)
MCHC: 33.9 g/dL (ref 30.0–36.0)
MCV: 90.9 fL (ref 80.0–100.0)
Monocytes Absolute: 1.4 K/uL — ABNORMAL HIGH (ref 0.1–1.0)
Monocytes Relative: 13 %
Neutro Abs: 8.6 K/uL — ABNORMAL HIGH (ref 1.7–7.7)
Neutrophils Relative %: 80 %
Platelets: 157 K/uL (ref 150–400)
RBC: 4.86 MIL/uL (ref 4.22–5.81)
RDW: 13.2 % (ref 11.5–15.5)
WBC: 10.8 K/uL — ABNORMAL HIGH (ref 4.0–10.5)
nRBC: 0 % (ref 0.0–0.2)

## 2024-03-25 LAB — I-STAT CHEM 8, ED
BUN: 28 mg/dL — ABNORMAL HIGH (ref 8–23)
Calcium, Ion: 1.1 mmol/L — ABNORMAL LOW (ref 1.15–1.40)
Chloride: 94 mmol/L — ABNORMAL LOW (ref 98–111)
Creatinine, Ser: 0.9 mg/dL (ref 0.61–1.24)
Glucose, Bld: 105 mg/dL — ABNORMAL HIGH (ref 70–99)
HCT: 46 % (ref 39.0–52.0)
Hemoglobin: 15.6 g/dL (ref 13.0–17.0)
Potassium: 5.7 mmol/L — ABNORMAL HIGH (ref 3.5–5.1)
Sodium: 127 mmol/L — ABNORMAL LOW (ref 135–145)
TCO2: 25 mmol/L (ref 22–32)

## 2024-03-25 SURGERY — THROMBECTOMY, ARTERY, FEMORAL
Anesthesia: General | Site: Leg Lower | Laterality: Left

## 2024-03-25 MED ORDER — PROPOFOL 10 MG/ML IV BOLUS
INTRAVENOUS | Status: AC
  Filled 2024-03-25: qty 20

## 2024-03-25 MED ORDER — LIDOCAINE 2% (20 MG/ML) 5 ML SYRINGE
INTRAMUSCULAR | Status: AC
  Filled 2024-03-25: qty 5

## 2024-03-25 MED ORDER — FENTANYL CITRATE (PF) 250 MCG/5ML IJ SOLN
INTRAMUSCULAR | Status: AC
  Filled 2024-03-25: qty 5

## 2024-03-25 MED ORDER — HEPARIN BOLUS VIA INFUSION
4000.0000 [IU] | Freq: Once | INTRAVENOUS | Status: AC
  Administered 2024-03-25: 4000 [IU] via INTRAVENOUS
  Filled 2024-03-25: qty 4000

## 2024-03-25 MED ORDER — HEPARIN (PORCINE) 25000 UT/250ML-% IV SOLN
1450.0000 [IU]/h | INTRAVENOUS | Status: DC
  Administered 2024-03-25: 1200 [IU]/h via INTRAVENOUS
  Administered 2024-03-28: 1300 [IU]/h via INTRAVENOUS
  Filled 2024-03-25 (×4): qty 250

## 2024-03-25 MED ORDER — LACTATED RINGERS IV BOLUS
1000.0000 mL | Freq: Once | INTRAVENOUS | Status: AC
  Administered 2024-03-25: 1000 mL via INTRAVENOUS

## 2024-03-25 MED ORDER — IOHEXOL 350 MG/ML SOLN
100.0000 mL | Freq: Once | INTRAVENOUS | Status: AC | PRN
  Administered 2024-03-25: 100 mL via INTRAVENOUS

## 2024-03-25 MED ORDER — SUCCINYLCHOLINE CHLORIDE 200 MG/10ML IV SOSY
PREFILLED_SYRINGE | INTRAVENOUS | Status: AC
  Filled 2024-03-25: qty 10

## 2024-03-25 MED ORDER — ONDANSETRON HCL 4 MG/2ML IJ SOLN
INTRAMUSCULAR | Status: AC
  Filled 2024-03-25: qty 2

## 2024-03-25 SURGICAL SUPPLY — 79 items
BAG COUNTER SPONGE SURGICOUNT (BAG) ×3 IMPLANT
BAG SNAP BAND KOVER 36X36 (MISCELLANEOUS) ×3 IMPLANT
BALLOON MUSTANG 5X100X135 (BALLOONS) IMPLANT
BLADE SURG 11 STRL SS (BLADE) ×3 IMPLANT
BNDG COMPR ESMARK 6X3 LF (GAUZE/BANDAGES/DRESSINGS) IMPLANT
CANISTER SUCTION 3000ML PPV (SUCTIONS) ×3 IMPLANT
CANNULA VESSEL 3MM 2 BLNT TIP (CANNULA) IMPLANT
CATH ANGIO 5F BER2 65CM (CATHETERS) IMPLANT
CATH EMB 3FR 80 (CATHETERS) IMPLANT
CATH EMB 4FR 40 (CATHETERS) IMPLANT
CATH EMB 4FR 80 (CATHETERS) IMPLANT
CATH EMB 5FR 80CM (CATHETERS) IMPLANT
CATH OMNI FLUSH .035X70CM (CATHETERS) IMPLANT
CHLORAPREP W/TINT 26 (MISCELLANEOUS) ×3 IMPLANT
CLIP LIGATING EXTRA MED SLVR (CLIP) ×3 IMPLANT
CLIP LIGATING EXTRA SM BLUE (MISCELLANEOUS) ×3 IMPLANT
COVER DOME SNAP 22 D (MISCELLANEOUS) ×3 IMPLANT
COVER PROBE W GEL 5X96 (DRAPES) ×3 IMPLANT
COVER SURGICAL LIGHT HANDLE (MISCELLANEOUS) ×3 IMPLANT
CUFF TOURN SGL QUICK 42 (TOURNIQUET CUFF) IMPLANT
CUFF TRNQT CYL 24X4X16.5-23 (TOURNIQUET CUFF) IMPLANT
CUFF TRNQT CYL 34X4.125X (TOURNIQUET CUFF) IMPLANT
DERMABOND ADVANCED .7 DNX12 (GAUZE/BANDAGES/DRESSINGS) ×6 IMPLANT
DEVICE TORQUE KENDALL .025-038 (MISCELLANEOUS) IMPLANT
DRAPE C-ARM 42X72 X-RAY (DRAPES) IMPLANT
DRAPE FEMORAL ANGIO 80X135IN (DRAPES) ×3 IMPLANT
DRAPE HALF SHEET 40X57 (DRAPES) IMPLANT
DRSG COVADERM 4X10 (GAUZE/BANDAGES/DRESSINGS) IMPLANT
DRSG COVADERM 4X6 (GAUZE/BANDAGES/DRESSINGS) IMPLANT
ELECTRODE REM PT RTRN 9FT ADLT (ELECTROSURGICAL) ×3 IMPLANT
EVACUATOR SILICONE 100CC (DRAIN) IMPLANT
GAUZE 4X4 16PLY ~~LOC~~+RFID DBL (SPONGE) ×3 IMPLANT
GLOVE BIO SURGEON STRL SZ7.5 (GLOVE) ×3 IMPLANT
GOWN STRL REUS W/ TWL LRG LVL3 (GOWN DISPOSABLE) ×6 IMPLANT
GOWN STRL REUS W/ TWL XL LVL3 (GOWN DISPOSABLE) ×3 IMPLANT
GUIDEWIRE ANGLED .035X150CM (WIRE) IMPLANT
HEMOSTAT SNOW SURGICEL 2X4 (HEMOSTASIS) IMPLANT
INSERT FOGARTY SM (MISCELLANEOUS) IMPLANT
KIT BASIN OR (CUSTOM PROCEDURE TRAY) ×3 IMPLANT
KIT ENCORE 26 ADVANTAGE (KITS) IMPLANT
KIT TURNOVER KIT B (KITS) ×3 IMPLANT
LOOP VESSEL MINI RED (MISCELLANEOUS) IMPLANT
MARKER GRAFT CORONARY BYPASS (MISCELLANEOUS) IMPLANT
NDL PERC 18GX7CM (NEEDLE) ×3 IMPLANT
NEEDLE PERC 18GX7CM (NEEDLE) ×3 IMPLANT
PACK PERIPHERAL VASCULAR (CUSTOM PROCEDURE TRAY) ×3 IMPLANT
PACK SRG BSC III STRL LF ECLPS (CUSTOM PROCEDURE TRAY) ×3 IMPLANT
PAD ARMBOARD POSITIONER FOAM (MISCELLANEOUS) ×6 IMPLANT
POWDER SURGICEL 3.0 GRAM (HEMOSTASIS) IMPLANT
SET COLLECT BLD 21X3/4 12 (NEEDLE) IMPLANT
SET IV EXT TUBING 30 (IV SETS) IMPLANT
SET MICROPUNCTURE 5F STIFF (MISCELLANEOUS) ×3 IMPLANT
SHEATH AVANTI 11CM 5FR (SHEATH) IMPLANT
SHEATH PINNACLE ST 6F 45CM (SHEATH) IMPLANT
SOLN 0.9% NACL POUR BTL 1000ML (IV SOLUTION) ×6 IMPLANT
SOLN STERILE WATER BTL 1000 ML (IV SOLUTION) ×3 IMPLANT
STATION PROTECTION PRESSURIZED (MISCELLANEOUS) ×3 IMPLANT
STENT ELUVIA 6X150X130 (Permanent Stent) IMPLANT
STOPCOCK MORSE 400PSI 3WAY (MISCELLANEOUS) ×3 IMPLANT
SUT ETHILON 3 0 PS 1 (SUTURE) IMPLANT
SUT GORETEX 6.0 TT9 (SUTURE) IMPLANT
SUT MNCRL AB 4-0 PS2 18 (SUTURE) ×6 IMPLANT
SUT PROLENE 5 0 C 1 24 (SUTURE) ×3 IMPLANT
SUT PROLENE 6 0 BV (SUTURE) ×3 IMPLANT
SUT PROLENE 7 0 BV 1 (SUTURE) IMPLANT
SUT SILK 2 0 SH (SUTURE) ×3 IMPLANT
SUT SILK 3-0 18XBRD TIE 12 (SUTURE) IMPLANT
SUT VIC AB 2-0 CT1 TAPERPNT 27 (SUTURE) ×6 IMPLANT
SUT VIC AB 3-0 SH 27X BRD (SUTURE) ×6 IMPLANT
SYR 10ML LL (SYRINGE) ×9 IMPLANT
SYR 20ML LL LF (SYRINGE) ×3 IMPLANT
SYR 30ML LL (SYRINGE) ×3 IMPLANT
SYR MEDRAD MARK V 150ML (SYRINGE) IMPLANT
TAPE UMBILICAL 1/8X30 (MISCELLANEOUS) IMPLANT
TOWEL GREEN STERILE (TOWEL DISPOSABLE) ×6 IMPLANT
TRAY FOLEY MTR SLVR 16FR STAT (SET/KITS/TRAYS/PACK) ×3 IMPLANT
UNDERPAD 30X36 HEAVY ABSORB (UNDERPADS AND DIAPERS) ×3 IMPLANT
WIRE BENTSON .035X145CM (WIRE) ×3 IMPLANT
WIRE ROSEN-J .035X260CM (WIRE) IMPLANT

## 2024-03-25 NOTE — Therapy (Signed)
 OUTPATIENT OCCUPATIONAL THERAPY NEURO TREATMENT  Patient Name: Kyle Kelley MRN: [id number] DOB:1946/03/09, 78 y.o., male Today's Date: 03/25/2024  PCP: Charlott Dorn LABOR, MD REFERRING PROVIDER: Judithe Rocky BROCKS, NP  END OF SESSION:    OT End of Session - 03/25/24 1633     Visit Number 2    Number of Visits 8    Date for Recertification  05/06/24    Authorization Type MCR    Progress Note Due on Visit 8    OT Start Time 1445    OT Stop Time 1530    OT Time Calculation (min) 45 min    Equipment Utilized During Treatment FM materials    Activity Tolerance Patient tolerated treatment well    Behavior During Therapy WFL for tasks assessed/performed          Past Medical History:  Diagnosis Date   Aortic stenosis    Moderate AS with mean AVG 10.8, V-max 2.14m/s, SVI 28, DI 0.3   Carotid artery stenosis    1-39% bilaterally by dopplers 11/2022   Dyslipidemia    Hypertension    Inguinal hernia 2010   Small asymptomatic L inguinal Hernia   Permanent atrial fibrillation (HCC)    atrial fib    Pneumonia    hx of    Prostate cancer (HCC) 07/2012   s/p prostatectomy   PSA elevation    workup per Dr. Alm Fragmin, March 2014   Sigmoid diverticulosis    on colonoscopy in 2010   Skin cancer 02/2012   Dr Renay Homer   Tobacco dependence    rarely smokes-3 cigs per week   Past Surgical History:  Procedure Laterality Date   CARDIOVERSION  08/05/2005   Direct Current Cardioversion   COLONOSCOPY  2010   Direct current Cardioversion  08/05/2005   PROSTATE BIOPSY  09/06/2012   Dr. Alm Fragmin   ROBOT ASSISTED LAPAROSCOPIC RADICAL PROSTATECTOMY N/A 10/31/2012   Procedure: ROBOTIC ASSISTED LAPAROSCOPIC RADICAL PROSTATECTOMY;  Surgeon: Alm GORMAN Fragmin, MD;  Location: [facility];  Service: Urology;  Laterality: N/A;   TONSILLECTOMY AND ADENOIDECTOMY     TONSILLECTOMY AND ADENOIDECTOMY     Patient Active Problem List   Diagnosis Date Noted   Allergic to bees 03/25/2024   History of  stroke without residual deficits 03/25/2024   Mixed hyperlipidemia 03/25/2024   ICH (intracerebral hemorrhage) (HCC) 02/28/2024   Aortic stenosis    Carotid artery stenosis    Dizziness 09/02/2020   Tobacco abuse 09/02/2020   Parotid mass 09/02/2020   Atrial fibrillation (HCC)    Malignant neoplasm of prostate (HCC) 09/05/2016   Essential hypertension, benign 05/08/2013   Pure hypercholesterolemia 05/08/2013   Permanent atrial fibrillation (HCC) 03/05/2013    ONSET DATE: 02/29/2024 referral date Date of Admission: 02/27/2024 Date of Discharge: 02/29/2024  REFERRING DIAG: R42 (ICD-10-CM) - Dizziness S06.310A (ICD-10-CM) - Intraparenchymal hematoma of brain, right, without loss of consciousness, initial encounter (HCC)  THERAPY DIAG:  Other lack of coordination  Muscle weakness (generalized)  Attention and concentration deficit  Other symptoms and signs involving the musculoskeletal system  Rationale for Evaluation and Treatment: Rehabilitation  SUBJECTIVE:   SUBJECTIVE STATEMENT: Pt reports doing well and completing carrying flower pots into garage and dressing tasks as well as showering. Spouse reports providing SPV/Distant SPV. Pt accompanied by: significant other  PERTINENT HISTORY: Pt presenting to OP OT after 02/27/24-02/29/24 hospitalization for L intramedullary ICH. Other pertinent hx includes: HTN, prostate CA, afib, HLD, CAD, hx of smoking  PRECAUTIONS: Fall  WEIGHT BEARING RESTRICTIONS: No  PAIN:  Are you having pain? No  FALLS: Has patient fallen in last 6 months? Yes. Number of falls slid out of bed after returning from hospital. Small skin tear in knee.  LIVING ENVIRONMENT: Lives with: lives with their spouse Lives in: House/apartment 1 level with a carport Stairs: Yes: External: 3 steps; on left going up Has following equipment at home: Grab bars in shower/tub  PLOF: Independent and completed yardwork, completed shopping and liked to go to [employer] for, liked shooting guns and would do some gardening and carrying his potted plants around. Now has hired assistance for [employer]. Would help spouse complete laundry tasks and make the bed. Pt also completed taking the trash out, and completed light teaching laboratory technician work.   PATIENT GOALS: I would like to get my agility back.   OBJECTIVE:  Note: Objective measures were completed at Evaluation unless otherwise noted.  HAND DOMINANCE: Right  ADLs: Overall ADLs: Only requiring a little help with bathing Transfers/ambulation related to ADLs: Eating: Independent Grooming: Independent UB Dressing:  LB Dressing: I (seated EOB) Toileting: Independent Bathing: Some A required  Tub Shower transfers: Independent Equipment: Grab bars  IADLs: Shopping: TD Light housekeeping: TD Meal Prep: TD Community mobility: TD Medication management: Wife sets up pill dance movement psychotherapist and he Midwife: Independent Handwriting: no problems   MOBILITY STATUS: Independent  POSTURE COMMENTS:  rounded shoulders and forward head Sitting balance: Performs 25% or less sitting activity  ACTIVITY TOLERANCE: Activity tolerance:    UPPER EXTREMITY ROM:  WNL  Active ROM Right eval Left eval  Shoulder flexion    Shoulder abduction    Shoulder adduction    Shoulder extension    Shoulder internal rotation    Shoulder external rotation    Elbow flexion    Elbow extension    Wrist flexion    Wrist extension    Wrist ulnar deviation    Wrist radial deviation    Wrist pronation    Wrist supination    (Blank rows = not tested)  UPPER EXTREMITY MMT:  WNL with exception of L shoulder abduction  MMT Right eval Left eval  Shoulder flexion    Shoulder abduction  4  Shoulder adduction    Shoulder extension    Shoulder internal rotation    Shoulder external rotation    Middle trapezius    Lower trapezius    Elbow flexion    Elbow extension    Wrist flexion    Wrist extension     Wrist ulnar deviation    Wrist radial deviation    Wrist pronation    Wrist supination    (Blank rows = not tested)  HAND FUNCTION: Grip strength: Right: 83 lbs; Left: 75 lbs  COORDINATION: 9 Hole Peg test: Right: 27.34 sec; Left: 35.59 sec Box and Blocks:  Right 53 blocks, Left 39 blocks  SENSATION: Not tested  EDEMA: none   COGNITION: Overall cognitive status: Within functional limits for tasks assessed  VISION: Subjective report: No changes Baseline vision: Wears glasses for reading only Visual history: cataracts  VISION ASSESSMENT: WFL  Patient has difficulty with following activities due to following visual impairments: none  PERCEPTION: Not tested  PRAXIS: Not tested  OBSERVATIONS: Impaired FM coordination in L nondominant hand, fall hx and impaired balance  TREATMENT DATE: 03/25/24  F/u with spouse and pt on AE. Both reported that he does not need them now. Pt also reports completing laundry tasks and donning/doffing UB clothing with no complications.   Educated pt in FM coordination to improve dexterity and bilateral coordination for ADL/IADL. See pt instructions for detailed handout. Discussed with pt and spouse about any possible concerns with endurance, pt and spouse denied.       PATIENT EDUCATION: Education details: Educated in AE (tub transfer bench, long handled sponge, 3:1 BSC for seat to be placed over toilet at home) and where to obtain  Person educated: Patient and Spouse Education method: Medical Illustrator Education comprehension: verbalized understanding  HOME EXERCISE PROGRAM:    GOALS: Goals reviewed with patient? Yes  SHORT TERM GOALS: Target date: 04/08/24  Pt will be independent with HEP for coordination in L hand Baseline: New to OP OT Goal status: INITIAL  2.  Pt will be independent  with transfers to/from AE (3:1 BSC placed over toilet at home, tub transfer bench or shower chair) should he and spouse obtain them Baseline: Pt currently does not have these Goal status: GOAL MET (pt requires not needing these)   3.  Pt will return to completing laundry tasks with no safety concerns Baseline: Spouse currently keeping pt from completing, has agreed to allow pt to return to this  Goal status: IN PROGRESS  4.  Pt will don and doff UB clothing independently Baseline: Min A Goal status: GOAL MET  LONG TERM GOALS: Target date: 05/06/24  Pt will demonstrate improved FM coordination by a score of at least 50 blocks in Box and Blocks Test in L hand Baseline: 39 Goal status: INITIAL  2.  Pt will demonstrate improved FM coordination by a 9HPT score of no more than 30 seconds on L hand Baseline: 35.59 seconds L hand Goal status: INITIAL  3.  Pt will demonstrate improved cardiopulmonary endurance by standing prolonged at least 20 minutes (and carrying at least 8# for at least 5 minutes of those 20 minutes)  Baseline: New to OP OT Goal status: INITIAL     ASSESSMENT:  CLINICAL IMPRESSION: Patient is a 78 y.o. male who was seen today for occupational therapy tx for s/p ICH. Hx includes HTN, prostate CA, afib, HLD, CAD, hx of smoking. Pt participating well with FM coordination and reports not needing any AE at this time. Pt would benefit from skilled OT services in the outpatient setting to work on impairments as noted below to help pt return to PLOF as able.     PERFORMANCE DEFICITS: in functional skills including ADLs, coordination, dexterity, mobility, endurance, and cardiopulmonary status limiting function, cognitive skills including safety awareness, and psychosocial skills including environmental adaptation and routines and behaviors.   IMPAIRMENTS: are limiting patient from ADLs, IADLs, and leisure.   CO-MORBIDITIES: may have co-morbidities  that affects occupational  performance. Patient will benefit from skilled OT to address above impairments and improve overall function.  MODIFICATION OR ASSISTANCE TO COMPLETE EVALUATION: No modification of tasks or assist necessary to complete an evaluation.  OT OCCUPATIONAL PROFILE AND HISTORY: Problem focused assessment: Including review of records relating to presenting problem.  CLINICAL DECISION MAKING: LOW - limited treatment options, no task modification necessary  REHAB POTENTIAL: Good  EVALUATION COMPLEXITY: Low    PLAN:  OT FREQUENCY: 1x/week  OT DURATION: 8 weeks  PLANNED INTERVENTIONS: 97168 OT Re-evaluation, 97535 self care/ADL training, 02889 therapeutic exercise, 97530 therapeutic activity, functional mobility training,  patient/family education, and DME and/or AE instructions  RECOMMENDED OTHER SERVICES: None at this time  CONSULTED AND AGREED WITH PLAN OF CARE: Patient and family member/caregiver  PLAN FOR NEXT SESSION: FM coordination HEP f/u and further activities Energy conservation education    [place], [place] 03/25/2024, 4:35 PM

## 2024-03-25 NOTE — Progress Notes (Signed)
 ANTICOAGULATION CONSULT NOTE  Pharmacy Consult for Heparin  Indication: limb ischemia  Allergies  Allergen Reactions   Ace Inhibitors Anaphylaxis   Rythmol [Propafenone] Shortness Of Breath   Nitrofurantoin Rash    Patient Measurements: Height: 6' 1 (185.4 cm) Weight: 70.9 kg (156 lb 4.9 oz) IBW/kg (Calculated) : 79.9 Heparin  Dosing Weight: 70.9 kg  Vital Signs: Temp: 97.9 F (36.6 C) (10/27 1934) Temp Source: Oral (10/27 1934) BP: 167/78 (10/27 2013) Pulse Rate: 72 (10/27 2000)  Labs: Recent Labs    03/25/24 2001  HGB 15.6  HCT 46.0  CREATININE 0.90    Estimated Creatinine Clearance: 70 mL/min (by C-G formula based on SCr of 0.9 mg/dL).   Medical History: Past Medical History:  Diagnosis Date   Aortic stenosis    Moderate AS with mean AVG 10.8, V-max 2.17m/s, SVI 28, DI 0.3   Carotid artery stenosis    1-39% bilaterally by dopplers 11/2022   Dyslipidemia    Hypertension    Inguinal hernia 2010   Small asymptomatic L inguinal Hernia   Permanent atrial fibrillation (HCC)    atrial fib    Pneumonia    hx of    Prostate cancer (HCC) 07/2012   s/p prostatectomy   PSA elevation    workup per Dr. Alm Fragmin, March 2014   Sigmoid diverticulosis    on colonoscopy in 2010   Skin cancer 02/2012   Dr Renay Homer   Tobacco dependence    rarely smokes-3 cigs per week    Assessment: 27 yom with a history of prostate cancer, AF previously on eliquis  but this had been held since recent (September) hospitalization with ICH. Neurology outpatient notes planning for apixaban  resume 10/28. Patient is presenting with c/f limb ischemia. Heparin  per pharmacy consult placed for limb ischemia.  Hgb 15.6; plt pending  Goal of Therapy:  Heparin  level 0.3-0.7 units/ml Monitor platelets by anticoagulation protocol: Yes   Plan:  Discussed ICH history with ED MD. Manuelita with heparin  start without CT Head at this time given neurology's plan to re-start apixaban  10/28. ED MD ok with  initial bolus as well. Give 4000 units IV heparin  bolus Start heparin  infusion at 1200 units/hr Check anti-Xa level in 6 hours and daily while on heparin  Continue to monitor H&H and platelets  Dorn Buttner, PharmD, BCPS 03/25/2024 8:25 PM ED Clinical Pharmacist -  [phone]

## 2024-03-25 NOTE — ED Provider Notes (Signed)
 [place] EMERGENCY DEPARTMENT AT [facility] Provider Note   CSN: [id number] Arrival date & time: 03/25/24  8079     Patient presents with: Cold Extremity (Blue and no pulse)   Kyle Kelley is a 78 y.o. male.   Is a 78 year old male presenting emergency department for foot pain.  Started abruptly this afternoon while doing physical therapy.  Foot became tight, then noticed it was hurting more several hours later and was discolored and cold.  Denies any history of leg ischemia or arterial insufficiency.  He does note recent hemorrhagic stroke and was cleared by his neurologist to restart his Eliquis  as he has not been taking it for the past month        Prior to Admission medications   Medication Sig Start Date End Date Taking? Authorizing Provider  Cholecalciferol (VITAMIN D3) 25 MCG (1000 UT) CAPS Take 1,000 Units by mouth every other day.    [provider]  diltiazem  (TIAZAC ) 300 MG 24 hr capsule Take 300 mg by mouth daily.    [provider]  EPINEPHrine  (EPIPEN  IJ) Inject 1 application as directed as needed. Patient not taking: Reported on 03/25/2024    [provider]  loratadine  (CLARITIN ) 10 MG tablet Take 10 mg by mouth daily as needed for allergies. Patient not taking: Reported on 03/25/2024    [provider]  metoprolol  succinate (TOPROL -XL) 25 MG 24 hr tablet Take 1 tablet (25 mg total) by mouth daily. 12/25/23   Shlomo Wilbert SAUNDERS, MD  rosuvastatin  (CRESTOR ) 10 MG tablet Take 1 tablet (10 mg total) by mouth daily. 12/25/23   Shlomo Wilbert SAUNDERS, MD    Allergies: Ace inhibitors, Rythmol [propafenone], and Nitrofurantoin    Review of Systems  Updated Vital Signs BP (!) 167/78   Pulse 72   Temp 97.9 F (36.6 C) (Oral)   Resp (!) 24   Ht 6' 1 (1.854 m)   Wt 70.9 kg   SpO2 99%   BMI 20.62 kg/m   Physical Exam Vitals and nursing note reviewed.  Constitutional:      Appearance: He is not toxic-appearing.  HENT:      Head: Normocephalic.     Nose: Nose normal.     Mouth/Throat:     Mouth: Mucous membranes are moist.  Eyes:     Conjunctiva/sclera: Conjunctivae normal.  Cardiovascular:     Rate and Rhythm: Normal rate and regular rhythm.  Pulmonary:     Effort: Pulmonary effort is normal.  Abdominal:     General: Abdomen is flat. There is no distension.     Tenderness: There is no abdominal tenderness. There is no guarding or rebound.  Musculoskeletal:        General: Normal range of motion.     Comments: Left foot is cool compared to the right, dusky color.  Unable to palpate DP pulse or dorsalis pedis.  Unable to Doppler pulse on either  Skin:    General: Skin is warm.  Neurological:     Mental Status: He is alert and oriented to person, place, and time.  Psychiatric:        Mood and Affect: Mood normal.        Behavior: Behavior normal.     (all labs ordered are listed, but only abnormal results are displayed) Labs Reviewed  CBC WITH DIFFERENTIAL/PLATELET - Abnormal; Notable for the following components:      Result Value   WBC 10.8 (*)    Neutro  Abs 8.6 (*)    Monocytes Absolute 1.4 (*)    All other components within normal limits  I-STAT CG4 LACTIC ACID, ED - Abnormal; Notable for the following components:   Lactic Acid, Venous 2.6 (*)    All other components within normal limits  I-STAT CHEM 8, ED - Abnormal; Notable for the following components:   Sodium 127 (*)    Potassium 5.7 (*)    Chloride 94 (*)    BUN 28 (*)    Glucose, Bld 105 (*)    Calcium , Ion 1.10 (*)    All other components within normal limits  HEPARIN  LEVEL (UNFRACTIONATED)  COMPREHENSIVE METABOLIC PANEL WITH GFR  CBC  I-STAT CG4 LACTIC ACID, ED    EKG: None  Radiology: HYBRID OR IMAGING ([facility] ONLY) Result Date: 03/25/2024 There is no interpretation for this exam.  This order is for images obtained during a surgical procedure.  Please See Surgeries Tab for more information regarding the procedure.    CT ANGIO LOWER EXT BILAT W &/OR WO CONTRAST Result Date: 03/25/2024 EXAM: CTA BILATERAL LOWER EXTREMITY 03/25/2024 08:47:16 PM TECHNIQUE: Contrast-enhanced computed tomography angiography of the lower extremity was performed with multiplanar reconstructions. Maximum intensity projection images were created on a separate workstation and reviewed. Automated exposure control, iterative reconstruction, and/or weight based adjustment of the mA/kV was utilized to reduce the radiation dose to as low as reasonably achievable. COMPARISON: None available. CLINICAL HISTORY: FINDINGS: ARTERIAL: LEFT COMMON ILIAC ARTERY: The left common iliac artery demonstrates up to 50% irregular stenosis with patchy calcific plaques. There is up to a 75% origin stenosis in the internal iliac artery, moderate patchy irregular stenosis in the remainder. LEFT EXTERNAL ILIAC ARTERY: Left external iliac artery demonstrates patchy calcifications without flow-limiting stenosis. COMMON FEMORAL ARTERY: There is vessel thrombosis occluding the left common femoral artery and the origin of the left deep femoral artery. The left deep femoral artery reconstitutes just past the occluded origin with good flow in the remainder of the vessel. The left lateral circumflex femoral artery is patent. The left medial circumflex femoral artery is occluded in the proximal 1 cm of the vessel then reconstitutes. SUPERFICIAL FEMORAL ARTERY: There is thrombosis occluding the left superficial femoral artery origin with vessel reconstitution into the proximal thigh then complete occlusion down to the Hunter's canal segment which reconstitutes with 60% stenosis. There are patchy calcific plaques along its course. POPLITEAL ARTERY: There is patchy calcification in the left popliteal artery with thrombosis occluding the vessel. TIBIOPERONEAL TRUNK: No arterial enhancement can be detected in the tibioperoneal trunk. ANTERIOR TIBIAL ARTERY: No arterial enhancement can be  detected in the anterior tibial artery. No flow identified in the dorsalis pedis artery. PERONEAL ARTERY: No arterial enhancement can be detected in the peroneal artery. POSTERIOR TIBIAL ARTERY: No arterial enhancement can be detected in the posterior tibial artery. RIGHT COMMON ILIAC ARTERY: On the right, there are patchy vessel wall calcifications in the common iliac, internal and external iliac arteries but no flow limiting stenoses. RIGHT EXTERNAL ILIAC ARTERY: no flow limiting stenoses. RIGHT COMMON FEMORAL ARTERY: The right common femoral artery is patent with mild calcification posteriorly. The right deep femoral artery is patent with mild patchy calcifications. RIGHT SUPERFICIAL FEMORAL ARTERY: The right superficial femoral artery is patent with mild patchy calcifications. RIGHT POPLITEAL ARTERY: Right popliteal artery is patent. RIGHT TIBIOPERONEAL TRUNK: There are calcifications in the tibioperoneal trunk and proximal right anterior tibial artery with unobstructed flow in the right anterior and posterior tibial arteries  into the foot. RIGHT ANTERIOR TIBIAL ARTERY: Calcifications in the proximal right anterior tibial artery with unobstructed flow into the foot. RIGHT PERONEAL ARTERY: Right peroneal artery is occluded in the proximal 1 to 2 cm but reconstitutes in the upper calf with flow to the ankle. RIGHT POSTERIOR TIBIAL ARTERY: Unobstructed flow into the foot. BONES AND SOFT TISSUES: Advanced facet hypertrophy in the lower lumbar spine. Mild arthrosis of the hips. Asymmetric degenerative arthrosis of the right femorotibial joint, greatest laterally with a large loose body in the anterior right knee joint. No acute or significant osseous findings. BOWEL: Mucosal enhancement in multiple lower abdominal and pelvic small bowel segments consistent with nonspecific enteritis. No mesenteric inflammatory changes. Advanced sigmoid diverticulosis without evidence of diverticulitis. PELVIS: The bladder and  prostate are unremarkable. No pelvic fluid collections or masses. Bilateral inguinal fat hernias. IMPRESSION: 1. Left lower extremity arterial occlusive disease with thrombosis of the common femoral artery and popliteal artery, mostly occluded superficial femoral artery with segmental reconstitution and 60% stenosis in Hunters canal, and no popliteal trifurcation arterial enhancement, consistent with critical limb ischemia risk. 2. Right peroneal artery occlusion proximally with reconstitution and preserved distal tibial runoff. No other flow-limiting stenosis in the right lower extremity. 3. Advanced sigmoid diverticulosis without diverticulitis. 4. Nonspecific enteritis involving multiple lower abdominal and pelvic small bowel segments without mesenteric inflammatory changes. 5. Asymmetric osteoarthritis in the right knee with large loose body anteriorly. Electronically signed by: Francis Quam MD 03/25/2024 09:32 PM EDT RP Workstation: [id number]     .Critical Care  Performed by: Neysa Caron PARAS, DO Authorized by: Neysa Caron PARAS, DO   Critical care provider statement:    Critical care time (minutes):  30   Critical care was necessary to treat or prevent imminent or life-threatening deterioration of the following conditions:  Circulatory failure   Critical care was time spent personally by me on the following activities:  Development of treatment plan with patient or surrogate, discussions with consultants, evaluation of patient's response to treatment, examination of patient, ordering and review of laboratory studies, ordering and review of radiographic studies, ordering and performing treatments and interventions, pulse oximetry, re-evaluation of patient's condition and review of old charts   Care discussed with: admitting provider      Medications Ordered in the ED  heparin  ADULT infusion 100 units/mL (25000 units/250mL) (1,200 Units/hr Intravenous New Bag/Given 03/25/24 2052)  lactated  ringers  bolus 1,000 mL (1,000 mLs Intravenous New Bag/Given 03/25/24 2010)  heparin  bolus via infusion 4,000 Units (4,000 Units Intravenous Bolus from Bag 03/25/24 2052)  iohexol (OMNIPAQUE) 350 MG/ML injection 100 mL (100 mLs Intravenous Contrast Given 03/25/24 2048)    Clinical Course as of 03/25/24 2317  Mon Mar 25, 2024  1951 Evaluated patient.  Concern for ischemic limb.  Unable to palpate pulses in his left foot.  It is pale dusky colored.  Cap refill of 8 seconds compared to 3 seconds to the right.  Discoloration to the foot and distal calf.  Was unable to find a pulse with a Doppler either.  Vascular surgery consult placed.  Pharmacy to dose heparin .  Screening labs and CTA also ordered. [TY]  1959 Spoke with vascular who will come see the patient. [TY]  2120 Dr. Gari plans to take patient to OR.  Recommending medicine admission postop care. [TY]    Clinical Course User Index [TY] Neysa Caron PARAS, DO  Medical Decision Making This is a 78 year old male complex past medical history to include atrial fibrillation, carotid stenosis, aortic stenosis recent stroke and has been off anticoagulation for the past month presented with foot pain.  He is afebrile nontachycardic, slightly hypertensive.  Physical exam concerning for ischemic limb.  Started on heparin .  Labs ordered which showed elevated lactate consistent with ischemia..  Does appear to have some dehydration with low sodium and chloride.  Potassium elevated on i-STAT, but does not appear to have EKG changes.  Case discussed with vascular who take him to the OR for revascularization.  Case discussed with hospitalist for admission.  Amount and/or Complexity of Data Reviewed External Data Reviewed:     Details: Per chart review saw his neurologist today which cleared him for restarting his Eliquis  Labs: ordered. Decision-making details documented in ED Course. Radiology: ordered and independent  interpretation performed.    Details: Appears to have blockages proximally and distally ECG/medicine tests: ordered.  Risk Prescription drug management. Decision regarding hospitalization. Diagnosis or treatment significantly limited by social determinants of health. Risk Details: Poor health literacy       Final diagnoses:  Lower limb ischemia    ED Discharge Orders     None          Neysa Caron PARAS, DO 03/25/24 2317

## 2024-03-25 NOTE — Progress Notes (Addendum)
 [facility] [address] [place]. [place] [address] [phone]       OFFICE CONSULT NOTE  Kyle Kelley Date of Birth:  July 13, 1945 Medical Record Number:  [id number]   Referring MD: Ary Cummins  Reason for Referral: Intracerebral hemorrhage  HPI: Kyle Kelley is a 78 year old pleasant Caucasian male seen today for initial office consultation visit for intracerebral hemorrhage.  He is accompanied by his wife.  History is obtained from them and review of electronic medical records.  I personally reviewed pertinent available imaging films in PACS.  He has past medical history of prostate cancer s/p resection in 2014, s/p radiation for recurrence in 2018, atrial fibrillation on Eliquis  who presented on 02/27/2024 for sudden onset of dizziness and gait imbalance evaluation.  He was initially treated in the ER with meclizine  but subsequently CT scan showed a hyperdensity in the left medulla raising concern for hemorrhage which was confirmed on MRI which showed a 7 mm dorsal left medullary hemorrhage..  Patient was admitted and blood pressure was tightly controlled.  Etiology of hemorrhage was felt to be related to Eliquis  use in the setting of hypertension.  CT angiogram of the head and neck was negative for large vessel stenosis or occlusion.  There is 60% stenosis of the right proximal carotid with atheromatous plaque.  Transthoracic echo showed ejection fraction of 55 to 60%.  LDL cholesterol 62 mg percent.  Hemoglobin A1c was 5.7.  Patient's Eliquis  was held.  Patient had follow-up CT scan of the head done on 03/11/2024 which shows near complete resolution of the small dorsal left medullary hemorrhage which is difficult to see.  Patient states he done well since discharge.  He is still getting physical occupational therapy.  His ataxia has improved.  He can ambulate independently.  Even driving and he feels he is back to his baseline.  Patient and his wife have discussed the  possibilities of going back on Eliquis  versus Watchman device versus just staying on aspirin  and he has made up his mind that he would like to go back on Eliquis .  His blood pressure is under good control at home and today it is borderline at 146/79 in office.  He has no new complaints.  ROS:   14 system review of systems is positive for gait imbalance walking difficulty all systems negative  PMH:  Past Medical History:  Diagnosis Date   Aortic stenosis    Moderate AS with mean AVG 10.8, V-max 2.86m/s, SVI 28, DI 0.3   Carotid artery stenosis    1-39% bilaterally by dopplers 11/2022   Dyslipidemia    Hypertension    Inguinal hernia 2010   Small asymptomatic L inguinal Hernia   Permanent atrial fibrillation (HCC)    atrial fib    Pneumonia    hx of    Prostate cancer (HCC) 07/2012   s/p prostatectomy   PSA elevation    workup per Dr. Alm Fragmin, March 2014   Sigmoid diverticulosis    on colonoscopy in 2010   Skin cancer 02/2012   Dr Renay Homer   Tobacco dependence    rarely smokes-3 cigs per week    Social History:  Social History   Socioeconomic History   Marital status: Married    Spouse name: Not on file   Number of children: Not on file   Years of education: Not on file   Highest education level: Not on file  Occupational History   Not on file  Tobacco Use   Smoking status: Every Day    Current packs/day: 0.50    Average packs/day: 0.5 packs/day for 50.0 years (25.0 ttl pk-yrs)    Types: Cigarettes   Smokeless tobacco: Never  Vaping Use   Vaping status: Never Used  Substance and Sexual Activity   Alcohol use: Yes    Comment: rare, heavier in the past   Drug use: No   Sexual activity: Never  Other Topics Concern   Not on file  Social History Narrative   Tobacco Use cigarettes: Current Smoker   Smoking: Yes, less than 1/2 pk per month   Alcohol: Yes - No Alcohol since June 2005   Caffeine: yes   No recreational drug use   Occupation: Employed   Marital  Status: Married 38 years, wife has MS   Children: none   Social Drivers of Corporate Investment Banker Strain: Not on file  Food Insecurity: No Food Insecurity (03/04/2024)   Hunger Vital Sign    Worried About Running Out of Food in the Last Year: Never true    Ran Out of Food in the Last Year: Never true  Transportation Needs: No Transportation Needs (03/04/2024)   PRAPARE - Administrator, Civil Service (Medical): No    Lack of Transportation (Non-Medical): No  Physical Activity: Not on file  Stress: Not on file  Social Connections: Socially Integrated (02/28/2024)   Social Connection and Isolation Panel    Frequency of Communication with Friends and Family: Three times a week    Frequency of Social Gatherings with Friends and Family: Three times a week    Attends Religious Services: More than 4 times per year    Active Member of Clubs or Organizations: Yes    Attends Banker Meetings: More than 4 times per year    Marital Status: Married  Catering Manager Violence: Not At Risk (02/28/2024)   Humiliation, Afraid, Rape, and Kick questionnaire    Fear of Current or Ex-Partner: No    Emotionally Abused: No    Physically Abused: No    Sexually Abused: No    Medications:   Current Outpatient Medications on File Prior to Visit  Medication Sig Dispense Refill   Cholecalciferol (VITAMIN D3) 25 MCG (1000 UT) CAPS Take 1,000 Units by mouth every other day.     diltiazem  (TIAZAC ) 300 MG 24 hr capsule Take 300 mg by mouth daily.     metoprolol  succinate (TOPROL -XL) 25 MG 24 hr tablet Take 1 tablet (25 mg total) by mouth daily. 90 tablet 3   rosuvastatin  (CRESTOR ) 10 MG tablet Take 1 tablet (10 mg total) by mouth daily. 90 tablet 3   EPINEPHrine  (EPIPEN  IJ) Inject 1 application as directed as needed. (Patient not taking: Reported on 03/25/2024)     loratadine  (CLARITIN ) 10 MG tablet Take 10 mg by mouth daily as needed for allergies. (Patient not taking: Reported on  03/25/2024)     No current facility-administered medications on file prior to visit.    Allergies:   Allergies  Allergen Reactions   Ace Inhibitors Anaphylaxis   Rythmol [Propafenone] Shortness Of Breath   Nitrofurantoin Rash    Physical Exam General: well developed, well nourished, seated, in no evident distress Head: head normocephalic and atraumatic.   Neck: supple with no carotid or supraclavicular bruits Cardiovascular: regular rate and rhythm, no murmurs Musculoskeletal: no deformity Skin:  no rash/petichiae Vascular:  Normal pulses all extremities  Neurologic Exam Mental Status: Awake  and fully alert. Oriented to place and time. Recent and remote memory intact. Attention span, concentration and fund of knowledge appropriate. Mood and affect appropriate.  Able to name 12 animals which can walk on forelegs.  Clock drawing 4/4.  Geriatric depression scale 0 not depressed.  Functional activity questionnaire 1 quite independent Cranial Nerves: Fundoscopic exam reveals sharp disc margins. Pupils equal, briskly reactive to light. Extraocular movements full without nystagmus. Visual fields full to confrontation. Hearing intact. Facial sensation intact. Face, tongue, palate moves normally and symmetrically.  Motor: Normal bulk and tone. Normal strength in all tested extremity muscles. Sensory.: intact to touch , pinprick , position and vibratory sensation.  Coordination: Rapid alternating movements normal in all extremities. Finger-to-nose and heel-to-shin performed accurately bilaterally. Gait and Station: Arises from chair without difficulty. Stance is normal. Gait demonstrates normal stride length and balance . Able to heel, toe and tandem walk without difficulty.  Reflexes: 1+ and symmetric. Toes downgoing.   NIHSS  0 Modified Rankin  2    03/25/2024   11:29 AM  MMSE - Mini Mental State Exam  Orientation to time 4  Orientation to Place 5  Registration 3  Attention/  Calculation 5  Recall 3  Language- name 2 objects 2  Language- repeat 1  Language- follow 3 step command 3  Language- read & follow direction 1  Write a sentence 1  Copy design 1  Total score 29     ASSESSMENT: 78 year old Caucasian male with small left dorsal medullary hemorrhage in September 2025 likely due to combination of hypertension and Eliquis  usage.  He has done remarkably well with near complete resolution of his left-sided ataxia.  Patient does have atrial fibrillation and remains at risk for embolism and stroke.  After careful discussion about risk benefits and alternatives including no anticoagulation, antiplatelet therapy alone, Watchman device consideration or participation in ASPIRE clinical trial(aspirin  versus Eliquis  in patients with intracerebral hemorrhage and A-fib) he has decided to go back on Eliquis      PLAN:I had a long d/w patient and his wife about his recent brain hemorrhage related to Eliquis  and hypertension, risk-benefit of resuming anticoagulation and alternatives like Watchman device or participation in ASPIRE trial, risk for recurrent stroke/TIAs, personally independently reviewed imaging studies and stroke evaluation results and answered questions.patient and wife have decided that they are going to resume Eliquis  5 mg twice daily for secondary stroke prevention and maintain strict control of hypertension with blood pressure goal below 130/90, diabetes with hemoglobin A1c goal below 6.5% and lipids with LDL cholesterol goal below 70 mg/dL. I also advised the patient to eat a healthy diet with plenty of whole grains, cereals, fruits and vegetables, exercise regularly and maintain ideal body weight Followup in the future with me in 6 to 8 months or call earlier if necessary.   I personally spent a total of 50 minutes in the care of the patient today including getting/reviewing separately obtained history, performing a medically appropriate exam/evaluation,  counseling and educating, placing orders, referring and communicating with other health care professionals, documenting clinical information in the EHR, independently interpreting results, and coordinating care.        Eather Popp, MD Note: This document was prepared with digital dictation and possible smart phrase technology. Any transcriptional errors that result from this process are unintentional.

## 2024-03-25 NOTE — H&P (Signed)
 H+P  Reason for Consult:  acute limb ischemia Referring Physician:  Dr. Neysa MRN #:  [id number]  History of Present Illness: This is a 78 y.o. male history of atrial fibrillation recently off of Eliquis  secondary to intracerebral hemorrhage.  He has persistent ataxia but no deficits.  He was evaluated by neurology today with plans to restart Eliquis  tomorrow.  During physical therapy today at 4:00 he developed short distance claudication in the left lower extremity.  Denies prior claudication or lower extremity rest pain.  States that his entire lower leg from the knee down is now numb and he cannot move his left foot.  He was able to walk approximately an hour or 2 after the procedure but no longer can walk without severe pain.  No previous vascular procedures.  He is a former smoker.  Heparin  has been initiated.  Past Medical History:  Diagnosis Date   Aortic stenosis    Moderate AS with mean AVG 10.8, V-max 2.16m/s, SVI 28, DI 0.3   Carotid artery stenosis    1-39% bilaterally by dopplers 11/2022   Dyslipidemia    Hypertension    Inguinal hernia 2010   Small asymptomatic L inguinal Hernia   Permanent atrial fibrillation (HCC)    atrial fib    Pneumonia    hx of    Prostate cancer (HCC) 07/2012   s/p prostatectomy   PSA elevation    workup per Dr. Alm Fragmin, March 2014   Sigmoid diverticulosis    on colonoscopy in 2010   Skin cancer 02/2012   Dr Renay Homer   Tobacco dependence    rarely smokes-3 cigs per week    Past Surgical History:  Procedure Laterality Date   CARDIOVERSION  08/05/2005   Direct Current Cardioversion   COLONOSCOPY  2010   Direct current Cardioversion  08/05/2005   PROSTATE BIOPSY  09/06/2012   Dr. Alm Fragmin   ROBOT ASSISTED LAPAROSCOPIC RADICAL PROSTATECTOMY N/A 10/31/2012   Procedure: ROBOTIC ASSISTED LAPAROSCOPIC RADICAL PROSTATECTOMY;  Surgeon: Alm GORMAN Fragmin, MD;  Location: [facility];  Service: Urology;  Laterality: N/A;   TONSILLECTOMY AND  ADENOIDECTOMY     TONSILLECTOMY AND ADENOIDECTOMY      Allergies  Allergen Reactions   Ace Inhibitors Anaphylaxis   Rythmol [Propafenone] Shortness Of Breath   Nitrofurantoin Rash    Prior to Admission medications   Medication Sig Start Date End Date Taking? Authorizing Provider  Cholecalciferol (VITAMIN D3) 25 MCG (1000 UT) CAPS Take 1,000 Units by mouth every other day.    [provider]  diltiazem  (TIAZAC ) 300 MG 24 hr capsule Take 300 mg by mouth daily.    [provider]  EPINEPHrine  (EPIPEN  IJ) Inject 1 application as directed as needed. Patient not taking: Reported on 03/25/2024    [provider]  loratadine  (CLARITIN ) 10 MG tablet Take 10 mg by mouth daily as needed for allergies. Patient not taking: Reported on 03/25/2024    [provider]  metoprolol  succinate (TOPROL -XL) 25 MG 24 hr tablet Take 1 tablet (25 mg total) by mouth daily. 12/25/23   Shlomo Wilbert SAUNDERS, MD  rosuvastatin  (CRESTOR ) 10 MG tablet Take 1 tablet (10 mg total) by mouth daily. 12/25/23   Shlomo Wilbert SAUNDERS, MD    Social History   Socioeconomic History   Marital status: Married    Spouse name: Not on file   Number of children: Not on file   Years of education: Not on file   Highest education level:  Not on file  Occupational History   Not on file  Tobacco Use   Smoking status: Every Day    Current packs/day: 0.50    Average packs/day: 0.5 packs/day for 50.0 years (25.0 ttl pk-yrs)    Types: Cigarettes   Smokeless tobacco: Never  Vaping Use   Vaping status: Never Used  Substance and Sexual Activity   Alcohol use: Yes    Comment: rare, heavier in the past   Drug use: No   Sexual activity: Never  Other Topics Concern   Not on file  Social History Narrative   Tobacco Use cigarettes: Current Smoker   Smoking: Yes, less than 1/2 pk per month   Alcohol: Yes - No Alcohol since June 2005   Caffeine: yes   No recreational drug use   Occupation: Employed   Marital  Status: Married 38 years, wife has MS   Children: none   Social Drivers of Corporate Investment Banker Strain: Not on file  Food Insecurity: No Food Insecurity (03/04/2024)   Hunger Vital Sign    Worried About Running Out of Food in the Last Year: Never true    Ran Out of Food in the Last Year: Never true  Transportation Needs: No Transportation Needs (03/04/2024)   PRAPARE - Administrator, Civil Service (Medical): No    Lack of Transportation (Non-Medical): No  Physical Activity: Not on file  Stress: Not on file  Social Connections: Socially Integrated (02/28/2024)   Social Connection and Isolation Panel    Frequency of Communication with Friends and Family: Three times a week    Frequency of Social Gatherings with Friends and Family: Three times a week    Attends Religious Services: More than 4 times per year    Active Member of Clubs or Organizations: Yes    Attends Banker Meetings: More than 4 times per year    Marital Status: Married  Catering Manager Violence: Not At Risk (02/28/2024)   Humiliation, Afraid, Rape, and Kick questionnaire    Fear of Current or Ex-Partner: No    Emotionally Abused: No    Physically Abused: No    Sexually Abused: No     Family History  Problem Relation Age of Onset   Hypertension Mother    Heart disease Father    Hyperlipidemia Father    Cancer Father        prostate    Review of Systems  Constitutional: Negative.   HENT: Negative.    Eyes: Negative.   Respiratory: Negative.    Cardiovascular: Negative.   Musculoskeletal: Negative.   Skin: Negative.   Neurological:  Positive for tingling, sensory change and focal weakness.       Imbalance  Endo/Heme/Allergies: Negative.       Physical Examination  Vitals:   03/25/24 2000 03/25/24 2013  BP: (!) 175/164 (!) 167/78  Pulse: 72   Resp: (!) 24   Temp:    SpO2: 99%    Body mass index is 20.62 kg/m.  Physical Exam HENT:     Head: Normocephalic.      Nose: Nose normal.     Mouth/Throat:     Mouth: Mucous membranes are moist.  Cardiovascular:     Pulses:          Femoral pulses are 2+ on the right side and 1+ on the left side.      Popliteal pulses are 2+ on the right side and 0 on the left  side.       Dorsalis pedis pulses are 2+ on the right side and 0 on the left side.       Posterior tibial pulses are 0 on the left side.  Pulmonary:     Effort: Pulmonary effort is normal.  Skin:    Capillary Refill: No capillary refill of the left foot and foot is mottled    Coloration: Skin is pale.     Comments: Left foot is cold and mottled  Neurological:     Mental Status: He is alert.     Sensory: Sensory deficit present.     Motor: Weakness present.  Psychiatric:        Mood and Affect: Mood normal.      CBC    Component Value Date/Time   WBC 10.8 (H) 03/25/2024 1942   RBC 4.86 03/25/2024 1942   HGB 15.6 03/25/2024 2001   HCT 46.0 03/25/2024 2001   PLT 157 03/25/2024 1942   MCV 90.9 03/25/2024 1942   MCH 30.9 03/25/2024 1942   MCHC 33.9 03/25/2024 1942   RDW 13.2 03/25/2024 1942   LYMPHSABS 0.7 03/25/2024 1942   MONOABS 1.4 (H) 03/25/2024 1942   EOSABS 0.0 03/25/2024 1942   BASOSABS 0.0 03/25/2024 1942    BMET    Component Value Date/Time   NA 127 (L) 03/25/2024 2001   K 5.7 (H) 03/25/2024 2001   CL 94 (L) 03/25/2024 2001   CO2 25 02/29/2024 0422   GLUCOSE 105 (H) 03/25/2024 2001   BUN 28 (H) 03/25/2024 2001   CREATININE 0.90 03/25/2024 2001   CALCIUM  8.3 (L) 02/29/2024 0422   GFRNONAA >60 02/29/2024 0422   GFRAA >90 11/01/2012 0522    COAGS: Lab Results  Component Value Date   INR 1.2 02/27/2024   INR 2.5 (H) 09/03/2020   INR 2.3 (H) 09/02/2020     Non-Invasive Vascular Imaging:   CTA reviewed, formal read pending   ASSESSMENT/PLAN: This is a 78 y.o. male with Rutherford 2B acute left lower extremity limb threatening ischemia likely secondary to atrial fibrillation off of Eliquis .  Heparin  has now  been initiated as he was cleared for anticoagulation by neurology today.  We discussed the limb-threatening nature of his current level of ischemia particularly given the underlying chronic disease.  He does not appear to be a candidate for endovascular salvage and we discussed open surgical revascularization to include thrombectomy, possible bypass, possible left lower extremity angiography and likely need for fasciotomies.  This was discussed with the patient in person as well as his wife via telephone.  All questions were answered they demonstrated understanding and consent was signed.  Kenzie Thoreson C. Sheree, MD [facility] Office: [phone] Pager: [phone]

## 2024-03-25 NOTE — Patient Instructions (Signed)
 Kyle Kelley

## 2024-03-25 NOTE — Patient Instructions (Signed)
 I had a long d/w patient and his wife about his recent brain hemorrhage related to Eliquis  and hypertension, risk-benefit of resuming anticoagulation and alternatives like Watchman device or participation in ASPIRE trial, risk for recurrent stroke/TIAs, personally independently reviewed imaging studies and stroke evaluation results and answered questions.patient and wife have decided that they are going to resume Eliquis  5 mg twice daily for secondary stroke prevention and maintain strict control of hypertension with blood pressure goal below 130/90, diabetes with hemoglobin A1c goal below 6.5% and lipids with LDL cholesterol goal below 70 mg/dL. I also advised the patient to eat a healthy diet with plenty of whole grains, cereals, fruits and vegetables, exercise regularly and maintain ideal body weight Followup in the future with me in 6 to 8 months or call earlier if necessary.

## 2024-03-25 NOTE — Anesthesia Preprocedure Evaluation (Addendum)
 Anesthesia Evaluation  Patient identified by MRN, date of birth, ID band Patient awake    Reviewed: Allergy & Precautions, NPO status , Patient's Chart, lab work & pertinent test results, reviewed documented beta blocker date and time Preop documentation limited or incomplete due to emergent nature of procedure.  Airway Mallampati: II  TM Distance: >3 FB Neck ROM: Full    Dental  (+) Teeth Intact, Dental Advisory Given, Caps   Pulmonary Current SmokerPatient did not abstain from smoking.   Pulmonary exam normal breath sounds clear to auscultation       Cardiovascular hypertension, Pt. on home beta blockers and Pt. on medications + Peripheral Vascular Disease (Rutherford 2B acute left lower extremity limb threatening ischemia likely secondary to atrial fibrillation off of Eliquis )  + dysrhythmias Atrial Fibrillation + Valvular Problems/Murmurs AS  Rhythm:Irregular Rate:Abnormal + Systolic murmurs    Neuro/Psych off of Eliquis  secondary to intracerebral hemorrhage    GI/Hepatic negative GI ROS, Neg liver ROS,,,  Endo/Other  negative endocrine ROS    Renal/GU negative Renal ROS   s/p prostatectomy    Musculoskeletal negative musculoskeletal ROS (+)    Abdominal   Peds  Hematology negative hematology ROS (+)   Anesthesia Other Findings Day of surgery medications reviewed with the patient.  Last ate 1700  Reproductive/Obstetrics                              Anesthesia Physical Anesthesia Plan  ASA: 4 and emergent  Anesthesia Plan: General   Post-op Pain Management: Ofirmev  IV (intra-op)*   Induction: Intravenous, Rapid sequence and Cricoid pressure planned  PONV Risk Score and Plan: 2 and Dexamethasone and Ondansetron   Airway Management Planned: Oral ETT  Additional Equipment: Arterial line  Intra-op Plan:   Post-operative Plan: Possible Post-op intubation/ventilation  Informed  Consent: I have reviewed the patients History and Physical, chart, labs and discussed the procedure including the risks, benefits and alternatives for the proposed anesthesia with the patient or authorized representative who has indicated his/her understanding and acceptance.     Only emergency history available and Dental advisory given  Plan Discussed with: CRNA  Anesthesia Plan Comments:          Anesthesia Quick Evaluation

## 2024-03-25 NOTE — Therapy (Signed)
 OUTPATIENT PHYSICAL THERAPY NEURO TREATMENT   Patient Name: Kyle Kelley MRN: [id number] DOB:08-14-1945, 78 y.o., male Today's Date: 03/25/2024   PCP: Charlott Dorn LABOR, MD  REFERRING PROVIDER: Judithe Rocky BROCKS, NP  END OF SESSION:  PT End of Session - 03/25/24 1533     Visit Number 4    Number of Visits 9    Date for Recertification  04/12/24    Authorization Type MEDICARE PART A AND B    PT Start Time 1532    PT Stop Time 1600   full time not used due to incr fatigue/cramping in LLE   PT Time Calculation (min) 28 min    Equipment Utilized During Treatment Gait belt    Activity Tolerance Patient limited by fatigue    Behavior During Therapy WFL for tasks assessed/performed;Impulsive           Past Medical History:  Diagnosis Date   Aortic stenosis    Moderate AS with mean AVG 10.8, V-max 2.2m/s, SVI 28, DI 0.3   Carotid artery stenosis    1-39% bilaterally by dopplers 11/2022   Dyslipidemia    Hypertension    Inguinal hernia 2010   Small asymptomatic L inguinal Hernia   Permanent atrial fibrillation (HCC)    atrial fib    Pneumonia    hx of    Prostate cancer (HCC) 07/2012   s/p prostatectomy   PSA elevation    workup per Dr. Alm Fragmin, March 2014   Sigmoid diverticulosis    on colonoscopy in 2010   Skin cancer 02/2012   Dr Renay Homer   Tobacco dependence    rarely smokes-3 cigs per week   Past Surgical History:  Procedure Laterality Date   CARDIOVERSION  08/05/2005   Direct Current Cardioversion   COLONOSCOPY  2010   Direct current Cardioversion  08/05/2005   PROSTATE BIOPSY  09/06/2012   Dr. Alm Fragmin   ROBOT ASSISTED LAPAROSCOPIC RADICAL PROSTATECTOMY N/A 10/31/2012   Procedure: ROBOTIC ASSISTED LAPAROSCOPIC RADICAL PROSTATECTOMY;  Surgeon: Alm GORMAN Fragmin, MD;  Location: [facility];  Service: Urology;  Laterality: N/A;   TONSILLECTOMY AND ADENOIDECTOMY     TONSILLECTOMY AND ADENOIDECTOMY     Patient Active Problem List   Diagnosis Date Noted    Allergic to bees 03/25/2024   History of stroke without residual deficits 03/25/2024   Mixed hyperlipidemia 03/25/2024   ICH (intracerebral hemorrhage) (HCC) 02/28/2024   Aortic stenosis    Carotid artery stenosis    Dizziness 09/02/2020   Tobacco abuse 09/02/2020   Parotid mass 09/02/2020   Atrial fibrillation (HCC)    Malignant neoplasm of prostate (HCC) 09/05/2016   Essential hypertension, benign 05/08/2013   Pure hypercholesterolemia 05/08/2013   Permanent atrial fibrillation (HCC) 03/05/2013    ONSET DATE: 02/29/2024  REFERRING DIAG: R42 (ICD-10-CM) - Dizziness S06.310A (ICD-10-CM) - Intraparenchymal hematoma of brain, right, without loss of consciousness, initial encounter (HCC)  THERAPY DIAG:  Muscle weakness (generalized)  Unsteadiness on feet  Other abnormalities of gait and mobility  Rationale for Evaluation and Treatment: Rehabilitation  SUBJECTIVE:  SUBJECTIVE STATEMENT: Has been doing the exercises 3x a day, feels like the balance is getting better. Had a good visit neurologist and states that he has been healing well. Was cleared to return to gradual driving.   Pt accompanied by: [name]   PERTINENT HISTORY: Presented on 02/27/2024 with gait imbalance and dizziness. 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 PMH includes prostate CA, afib, HLD, CAD.   PAIN:  Are you having pain? No  Vitals:   03/25/24 1539 03/25/24 1556  BP: (!) 115/57 (!) 150/63  Pulse: 64 66    PRECAUTIONS: Fall  FALLS: Has patient fallen in last 6 months? Yes. Number of falls 1  LIVING ENVIRONMENT: Lives with: lives with their spouse Lives in: House/apartment Stairs: Yes: External: 3 steps; on left going up Has following equipment at  home: Single point cane, Walker - 2 wheeled, and Grab bars  PLOF: Independent and Leisure: used to do shooting sports, doing community activities like shopping, and going out to eat   Pt's spouse has been doing everything around the house, helps with bathing/dressing   PATIENT GOALS:Improve mobility, get back to the way he was    OBJECTIVE:  Note: Objective measures were completed at Evaluation unless otherwise noted.  DIAGNOSTIC FINDINGS: MRI brain 02/28/24 IMPRESSION: 1. 7 mm acute hemorrhage involving the left dorsal medulla, corresponding with abnormality on prior CT. Minimal edema within the adjacent medulla without significant regional mass effect. No visible underlying lesion or pathologic enhancement. 2. No other acute intracranial abnormality. 3. Underlying age-related cerebral atrophy with mild chronic small vessel ischemic disease.  COGNITION: Overall cognitive status: Within functional limits for tasks assessed   SENSATION:  Pt reports some tingling in his legs from the cholesterol medication and is mild, happens intermittently  Light touch: WFL  COORDINATION: Heel to shin: WNL    POSTURE: rounded shoulders and forward head   LOWER EXTREMITY MMT:    MMT Right Eval Left Eval  Hip flexion 5 5  Hip extension    Hip abduction 5 5  Hip adduction 5 5  Hip internal rotation    Hip external rotation    Knee flexion 5 5  Knee extension 4+ 4+  Ankle dorsiflexion 5 5  Ankle plantarflexion    Ankle inversion    Ankle eversion    (Blank rows = not tested)  All tested in sitting   BED MOBILITY:  Pt denies difficulties, but has RW nearby   TRANSFERS: Sit to stand: SBA  Assistive device utilized: None     Stand to sit: SBA  Assistive device utilized: None      SBA when using BUE support, with no UE support needs CGA   GAIT: Findings: Gait Characteristics: step through pattern, decreased stride length, and trunk flexed, Distance walked: Clinic distances ,  Assistive device utilized:Single point cane, Walker - 2 wheeled, and None, Level of assistance: SBA, and Comments: pt at times holding SPC instead of using it, slightly wider BOS when ambulating with no AD   FUNCTIONAL TESTS:  5 times sit to stand: 26.5 seconds with no UE support - CGA and one episode of min A due to almost losing balance posteriorly  Timed up and go (TUG): 12.1 seconds with no AD 10 meter walk test: 12.5 seconds = 2.64 ft/sec with RW, 11.4 seconds with no AD (pt holding cane) =  2.88 ft/sec  TREATMENT DATE: 03/25/24   Therapeutic Activity:  Vitals:   03/25/24 1539 03/25/24 1556  BP: (!) 115/57 (!) 150/63  Pulse: 64 66   Pt reporting he barely drank any water  today, so provided water  to pt at start of session and during session.   Pt ambulated to // bars with no AD and supervision   Near // bars: On blue foam beam: Slow side stepping down and back x2 reps with alternating SLS taps to 2 cones, performed with 5 cones total, CGA/min A for balance  Tandem gait down and back x3 reps, pt needing UE support, pt getting fatigued and reporting his L leg was feeling really crampy  When ambulating out of // bars, pt with decr stance time on LLE and needing CGA and sat pt down in chair to rest. Assessed BP and was WNL (see above). Provided pt with more water  and pt reporting he felt fine and his LLE just felt crampy. Pt still laughing and smiling   Pt had a long day at the neurologist and got here at 10 AM and had his appt at 11:30. Then pt and pt's wife reports that they sat in the waiting room for 2 hours here before his appts. Pt did not drink much water  or eat much today. Ended session early and ambulated pt out to car with RW with supervision. Discussed going home and drinking water , eating something and monitoring how pt is feeling. Discussed if pt gets  worse, then would need further medical attention. Pt and pt's spouse verbalized understanding    PATIENT EDUCATION: Education details: See above  Person educated: Patient and Spouse Education method: Medical Illustrator Education comprehension: verbalized understanding and needs further education  HOME EXERCISE PROGRAM: Access Code: [email or web address] URL: [email or web address] Date: 03/19/2024 Prepared by: Sheffield Senate  Exercises - Sit to Stand with Arms Crossed  - 1-2 x daily - 4-5 x weekly - 2 sets - 10 reps - Tandem Walking with Counter Support  - 1-2 x daily - 4-5 x weekly - 4 sets - Standing Marching  - 1-2 x daily - 4-5 x weekly - 4 sets - Wide Stance with Eyes Closed on Foam Pad  - 1-2 x daily - 4-5 x weekly - 3 sets - 30 hold - Standing with Head Rotation  - 1-2 x daily - 4-5 x weekly - 2 sets - 10 reps  GOALS: Goals reviewed with patient? Yes  SHORT TERM GOALS: ALL STGS = LTGS  LONG TERM GOALS: Target date: 04/08/2024  Pt will be independent with final HEP in order to build upon functional gains made in therapy. Baseline:  Goal status: INITIAL  2.  Pt will improve 5x sit<>stand to less than or equal to 19 sec to demonstrate improved functional strength and transfer efficiency.   Baseline: 26.5 seconds with no UE support  Goal status: INITIAL  3.  Pt will improve FGA to at least a 26/30 in order to demo decr fall risk.   Baseline: 20/30 Goal status: INITIAL  4.  Pt will ambulate at least 500' outdoors for community distances with LRAD and supervision.  Baseline:  Goal status: INITIAL   ASSESSMENT:  CLINICAL IMPRESSION: Pt arrived to clinic with no AD and had a long day with his neurologist appt earlier this morning. Pt did not have a lot of water  or did not eat much today. BP WFL. Session limited with balance tasks due to pt reporting incr LLE cramping with exercises and that his  R knee was bothersome (pt reports this is not new). Ended  session early and ambulated pt out to his car with RW for safety and due to fatigue. Pt reporting he felt fine, was just tired from a long day. Will continue per POC.     OBJECTIVE IMPAIRMENTS: Abnormal gait, decreased activity tolerance, decreased balance, decreased endurance, decreased knowledge of use of DME, decreased mobility, difficulty walking, decreased strength, and postural dysfunction.   ACTIVITY LIMITATIONS: squatting, stairs, transfers, dressing, locomotion level, and caring for others  PARTICIPATION LIMITATIONS: cleaning, laundry, driving, shopping, and community activity  PERSONAL FACTORS: Past/current experiences, Time since onset of injury/illness/exacerbation, and 3+ comorbidities: prostate CA, afib, HLD, CAD.  are also affecting patient's functional outcome.   REHAB POTENTIAL: Good  CLINICAL DECISION MAKING: Evolving/moderate complexity  EVALUATION COMPLEXITY: Moderate  PLAN:  PT FREQUENCY: 2x/week  PT DURATION: 4 weeks  PLANNED INTERVENTIONS: 97164- PT Re-evaluation, 97110-Therapeutic exercises, 97530- Therapeutic activity, 97112- Neuromuscular re-education, 97535- Self Care, [id number]- Manual therapy, [phone]- Gait training, Patient/Family education, Balance training, Stair training, and DME instructions  PLAN FOR NEXT SESSION: work on dynamic gait tasks with no AD, functional strengthening, SLS, balance on unlevel surfaces, dual tasking, moving to reach target tasks    Sheffield LOISE Senate, PT, DPT 03/25/2024, 4:16 PM

## 2024-03-26 ENCOUNTER — Emergency Department (HOSPITAL_COMMUNITY): Admitting: Anesthesiology

## 2024-03-26 ENCOUNTER — Encounter (HOSPITAL_COMMUNITY): Payer: Self-pay | Admitting: Internal Medicine

## 2024-03-26 ENCOUNTER — Ambulatory Visit

## 2024-03-26 ENCOUNTER — Inpatient Hospital Stay (HOSPITAL_COMMUNITY)

## 2024-03-26 ENCOUNTER — Ambulatory Visit: Admitting: Physical Therapy

## 2024-03-26 DIAGNOSIS — Z8679 Personal history of other diseases of the circulatory system: Secondary | ICD-10-CM | POA: Diagnosis not present

## 2024-03-26 DIAGNOSIS — I10 Essential (primary) hypertension: Secondary | ICD-10-CM | POA: Diagnosis not present

## 2024-03-26 DIAGNOSIS — I743 Embolism and thrombosis of arteries of the lower extremities: Secondary | ICD-10-CM | POA: Diagnosis not present

## 2024-03-26 DIAGNOSIS — I70222 Atherosclerosis of native arteries of extremities with rest pain, left leg: Secondary | ICD-10-CM | POA: Diagnosis present

## 2024-03-26 DIAGNOSIS — F17210 Nicotine dependence, cigarettes, uncomplicated: Secondary | ICD-10-CM

## 2024-03-26 DIAGNOSIS — I7777 Dissection of artery of lower extremity: Secondary | ICD-10-CM | POA: Diagnosis not present

## 2024-03-26 DIAGNOSIS — I4821 Permanent atrial fibrillation: Secondary | ICD-10-CM

## 2024-03-26 DIAGNOSIS — I70229 Atherosclerosis of native arteries of extremities with rest pain, unspecified extremity: Secondary | ICD-10-CM | POA: Diagnosis present

## 2024-03-26 DIAGNOSIS — I4891 Unspecified atrial fibrillation: Secondary | ICD-10-CM | POA: Diagnosis not present

## 2024-03-26 LAB — BASIC METABOLIC PANEL WITH GFR
Anion gap: 8 (ref 5–15)
BUN: 9 mg/dL (ref 8–23)
CO2: 25 mmol/L (ref 22–32)
Calcium: 8.1 mg/dL — ABNORMAL LOW (ref 8.9–10.3)
Chloride: 100 mmol/L (ref 98–111)
Creatinine, Ser: 0.69 mg/dL (ref 0.61–1.24)
GFR, Estimated: >60 mL/min (ref >60)
Glucose, Bld: 104 mg/dL — ABNORMAL HIGH (ref 70–99)
Potassium: 4.3 mmol/L (ref 3.5–5.1)
Sodium: 133 mmol/L — ABNORMAL LOW (ref 135–145)

## 2024-03-26 LAB — CBC WITH DIFFERENTIAL/PLATELET
Abs Immature Granulocytes: 0.01 K/uL (ref 0.00–0.07)
Basophils Absolute: 0 K/uL (ref 0.0–0.1)
Basophils Relative: 0 %
Eosinophils Absolute: 0 K/uL (ref 0.0–0.5)
Eosinophils Relative: 1 %
HCT: 38.4 % — ABNORMAL LOW (ref 39.0–52.0)
Hemoglobin: 13.2 g/dL (ref 13.0–17.0)
Immature Granulocytes: 0 %
Lymphocytes Relative: 19 %
Lymphs Abs: 1 K/uL (ref 0.7–4.0)
MCH: 30.9 pg (ref 26.0–34.0)
MCHC: 34.4 g/dL (ref 30.0–36.0)
MCV: 89.9 fL (ref 80.0–100.0)
Monocytes Absolute: 1 K/uL (ref 0.1–1.0)
Monocytes Relative: 19 %
Neutro Abs: 3.1 K/uL (ref 1.7–7.7)
Neutrophils Relative %: 61 %
Platelets: 124 K/uL — ABNORMAL LOW (ref 150–400)
RBC: 4.27 MIL/uL (ref 4.22–5.81)
RDW: 13.1 % (ref 11.5–15.5)
WBC: 5.1 K/uL (ref 4.0–10.5)
nRBC: 0 % (ref 0.0–0.2)

## 2024-03-26 LAB — POCT I-STAT 7, (LYTES, BLD GAS, ICA,H+H)
Acid-Base Excess: 0 mmol/L (ref 0.0–2.0)
Bicarbonate: 26 mmol/L (ref 20.0–28.0)
Calcium, Ion: 1.19 mmol/L (ref 1.15–1.40)
HCT: 37 % — ABNORMAL LOW (ref 39.0–52.0)
Hemoglobin: 12.6 g/dL — ABNORMAL LOW (ref 13.0–17.0)
O2 Saturation: 100 %
Patient temperature: 36.2
Potassium: 3.9 mmol/L (ref 3.5–5.1)
Sodium: 135 mmol/L (ref 135–145)
TCO2: 27 mmol/L (ref 22–32)
pCO2 arterial: 44.5 mmHg (ref 32–48)
pH, Arterial: 7.371 (ref 7.35–7.45)
pO2, Arterial: 467 mmHg — ABNORMAL HIGH (ref 83–108)

## 2024-03-26 LAB — HEPATIC FUNCTION PANEL
ALT: 40 U/L (ref 0–44)
AST: 33 U/L (ref 15–41)
Albumin: 2.9 g/dL — ABNORMAL LOW (ref 3.5–5.0)
Alkaline Phosphatase: 51 U/L (ref 38–126)
Bilirubin, Direct: 0.2 mg/dL (ref 0.0–0.2)
Indirect Bilirubin: 0.4 mg/dL (ref 0.3–0.9)
Total Bilirubin: 0.6 mg/dL (ref 0.0–1.2)
Total Protein: 5.6 g/dL — ABNORMAL LOW (ref 6.5–8.1)

## 2024-03-26 LAB — ECHOCARDIOGRAM COMPLETE
Height: 73 in
MV M vel: 2.7 m/s
MV Peak grad: 29.2 mmHg
Weight: 2500.9 [oz_av]

## 2024-03-26 LAB — POCT ACTIVATED CLOTTING TIME
Activated Clotting Time: 199 s
Activated Clotting Time: 245 s
Activated Clotting Time: 227 s

## 2024-03-26 LAB — HEPARIN LEVEL (UNFRACTIONATED): Heparin Unfractionated: 0.32 [IU]/mL (ref 0.30–0.70)

## 2024-03-26 LAB — PREPARE RBC (CROSSMATCH)

## 2024-03-26 MED ORDER — HEPARIN 6000 UNIT IRRIGATION SOLUTION
Status: DC | PRN
  Administered 2024-03-26: 1

## 2024-03-26 MED ORDER — ACETAMINOPHEN 325 MG PO TABS: 325.0000 mg | ORAL_TABLET | ORAL | Status: DC | PRN

## 2024-03-26 MED ORDER — CLOPIDOGREL BISULFATE 75 MG PO TABS: 75.0000 mg | ORAL_TABLET | Freq: Every day | ORAL | Status: DC

## 2024-03-26 MED ORDER — LIDOCAINE HCL (CARDIAC) PF 100 MG/5ML IV SOSY
PREFILLED_SYRINGE | INTRAVENOUS | Status: DC | PRN
  Administered 2024-03-26: 80 mg via INTRAVENOUS

## 2024-03-26 MED ORDER — BISACODYL 10 MG RE SUPP: 10.0000 mg | Freq: Every day | RECTAL | Status: DC | PRN

## 2024-03-26 MED ORDER — CEFAZOLIN SODIUM-DEXTROSE 2-4 GM/100ML-% IV SOLN
2.0000 g | Freq: Three times a day (TID) | INTRAVENOUS | Status: AC
  Administered 2024-03-26 (×2): 2 g via INTRAVENOUS
  Filled 2024-03-26 (×2): qty 100

## 2024-03-26 MED ORDER — OXYCODONE-ACETAMINOPHEN 5-325 MG PO TABS: 1.0000 | ORAL_TABLET | ORAL | Status: DC | PRN

## 2024-03-26 MED ORDER — HYDROMORPHONE HCL 1 MG/ML IJ SOLN: 0.5000 mg | INTRAMUSCULAR | Status: DC | PRN

## 2024-03-26 MED ORDER — CEFAZOLIN SODIUM-DEXTROSE 2-3 GM-%(50ML) IV SOLR
INTRAVENOUS | Status: DC | PRN
  Administered 2024-03-26: 2 g via INTRAVENOUS

## 2024-03-26 MED ORDER — ONDANSETRON HCL 4 MG/2ML IJ SOLN: 4.0000 mg | Freq: Once | INTRAMUSCULAR | Status: DC | PRN

## 2024-03-26 MED ORDER — ACETAMINOPHEN 650 MG RE SUPP: 325.0000 mg | RECTAL | Status: DC | PRN

## 2024-03-26 MED ORDER — ONDANSETRON HCL 4 MG/2ML IJ SOLN
INTRAMUSCULAR | Status: DC | PRN
  Administered 2024-03-26: 4 mg via INTRAVENOUS

## 2024-03-26 MED ORDER — ROSUVASTATIN CALCIUM 20 MG PO TABS
20.0000 mg | ORAL_TABLET | Freq: Every day | ORAL | Status: DC
  Administered 2024-03-26 – 2024-03-28 (×3): 20 mg via ORAL
  Filled 2024-03-26 (×3): qty 1

## 2024-03-26 MED ORDER — PROPOFOL 10 MG/ML IV BOLUS
INTRAVENOUS | Status: DC | PRN
  Administered 2024-03-26: 70 mg via INTRAVENOUS

## 2024-03-26 MED ORDER — PHENOL 1.4 % MT LIQD: 1.0000 | OROMUCOSAL | Status: DC | PRN

## 2024-03-26 MED ORDER — PHENYLEPHRINE 80 MCG/ML (10ML) SYRINGE FOR IV PUSH (FOR BLOOD PRESSURE SUPPORT)
PREFILLED_SYRINGE | INTRAVENOUS | Status: DC | PRN
  Administered 2024-03-26: 120 ug via INTRAVENOUS

## 2024-03-26 MED ORDER — SODIUM CHLORIDE 0.9 % IV SOLN: 500.0000 mL | Freq: Once | INTRAVENOUS | Status: DC | PRN

## 2024-03-26 MED ORDER — METOPROLOL SUCCINATE ER 25 MG PO TB24
25.0000 mg | ORAL_TABLET | Freq: Every day | ORAL | Status: DC
  Administered 2024-03-26 – 2024-03-28 (×3): 25 mg via ORAL
  Filled 2024-03-26 (×3): qty 1

## 2024-03-26 MED ORDER — HEPARIN SODIUM (PORCINE) 1000 UNIT/ML IJ SOLN
INTRAMUSCULAR | Status: DC | PRN
Start: 2024-03-26 — End: 2024-03-26
  Administered 2024-03-26: 6000 [IU] via INTRAVENOUS
  Administered 2024-03-26: 3000 [IU] via INTRAVENOUS

## 2024-03-26 MED ORDER — CLOPIDOGREL BISULFATE 75 MG PO TABS
75.0000 mg | ORAL_TABLET | Freq: Every day | ORAL | Status: DC
  Administered 2024-03-26 – 2024-03-28 (×3): 75 mg via ORAL
  Filled 2024-03-26 (×3): qty 1

## 2024-03-26 MED ORDER — POLYETHYLENE GLYCOL 3350 17 G PO PACK: 17.0000 g | PACK | Freq: Every day | ORAL | Status: DC | PRN

## 2024-03-26 MED ORDER — DOCUSATE SODIUM 100 MG PO CAPS
100.0000 mg | ORAL_CAPSULE | Freq: Every day | ORAL | Status: DC
  Administered 2024-03-27 – 2024-03-28 (×2): 100 mg via ORAL
  Filled 2024-03-26 (×3): qty 1

## 2024-03-26 MED ORDER — PHENYLEPHRINE HCL (PRESSORS) 10 MG/ML IV SOLN
INTRAVENOUS | Status: DC | PRN
  Administered 2024-03-26: 80 ug via INTRAVENOUS

## 2024-03-26 MED ORDER — DILTIAZEM HCL ER COATED BEADS 300 MG PO CP24
300.0000 mg | ORAL_CAPSULE | Freq: Every day | ORAL | Status: DC
  Administered 2024-03-26 – 2024-03-28 (×3): 300 mg via ORAL
  Filled 2024-03-26 (×3): qty 1

## 2024-03-26 MED ORDER — SUGAMMADEX SODIUM 200 MG/2ML IV SOLN
INTRAVENOUS | Status: DC | PRN
  Administered 2024-03-26: 300 mg via INTRAVENOUS

## 2024-03-26 MED ORDER — FENTANYL CITRATE (PF) 100 MCG/2ML IJ SOLN: 25.0000 ug | INTRAMUSCULAR | Status: DC | PRN

## 2024-03-26 MED ORDER — PHENYLEPHRINE HCL-NACL 20-0.9 MG/250ML-% IV SOLN
INTRAVENOUS | Status: DC | PRN
  Administered 2024-03-26: 20 ug/min via INTRAVENOUS

## 2024-03-26 MED ORDER — FENTANYL CITRATE (PF) 100 MCG/2ML IJ SOLN
INTRAMUSCULAR | Status: DC | PRN
  Administered 2024-03-26: 50 ug via INTRAVENOUS
  Administered 2024-03-26: 100 ug via INTRAVENOUS
  Administered 2024-03-26 (×2): 50 ug via INTRAVENOUS

## 2024-03-26 MED ORDER — LACTATED RINGERS IV SOLN: INTRAVENOUS | Status: DC | PRN

## 2024-03-26 MED ORDER — ROCURONIUM BROMIDE 100 MG/10ML IV SOLN
INTRAVENOUS | Status: DC | PRN
Start: 2024-03-26 — End: 2024-03-26
  Administered 2024-03-26: 60 mg via INTRAVENOUS
  Administered 2024-03-26: 40 mg via INTRAVENOUS
  Administered 2024-03-26: 20 mg via INTRAVENOUS

## 2024-03-26 MED ORDER — 0.9 % SODIUM CHLORIDE (POUR BTL) OPTIME
TOPICAL | Status: DC | PRN
  Administered 2024-03-26: 2000 mL

## 2024-03-26 MED ORDER — EPHEDRINE SULFATE (PRESSORS) 25 MG/5ML IV SOSY
PREFILLED_SYRINGE | INTRAVENOUS | Status: DC | PRN
  Administered 2024-03-26: 5 mg via INTRAVENOUS

## 2024-03-26 MED ORDER — ONDANSETRON HCL 4 MG/2ML IJ SOLN: 4.0000 mg | Freq: Four times a day (QID) | INTRAMUSCULAR | Status: DC | PRN

## 2024-03-26 MED ORDER — ROSUVASTATIN CALCIUM 5 MG PO TABS: 10.0000 mg | ORAL_TABLET | Freq: Every day | ORAL | Status: DC

## 2024-03-26 MED ORDER — SODIUM CHLORIDE 0.9% IV SOLUTION: Freq: Once | INTRAVENOUS | Status: DC

## 2024-03-26 MED ORDER — ALBUMIN HUMAN 5 % IV SOLN: INTRAVENOUS | Status: DC | PRN

## 2024-03-26 MED ORDER — IODIXANOL 320 MG/ML IV SOLN
INTRAVENOUS | Status: DC | PRN
  Administered 2024-03-26: 70 mL

## 2024-03-26 NOTE — Anesthesia Procedure Notes (Signed)
 Arterial Line Insertion Start/End10/28/2025 2:40 AM, 03/26/2024 2:47 AM Performed by: Corinne Garnette BRAVO, MD, anesthesiologist  Patient location: OR. Preanesthetic checklist: patient identified, IV checked, site marked, risks and benefits discussed, surgical consent, monitors and equipment checked, pre-op evaluation, timeout performed and anesthesia consent Right, radial was placed Catheter size: 20 G Hand hygiene performed  and maximum sterile barriers used   Attempts: 1 Following insertion, dressing applied and Biopatch. Post procedure assessment: normal and unchanged  Patient tolerated the procedure well with no immediate complications.

## 2024-03-26 NOTE — Progress Notes (Signed)
 ANTICOAGULATION CONSULT NOTE  Pharmacy Consult for Heparin  Indication: limb ischemia  Allergies  Allergen Reactions   Ace Inhibitors Anaphylaxis   Rythmol [Propafenone] Shortness Of Breath   Nitrofurantoin Rash    Patient Measurements: Height: 6' 1 (185.4 cm) Weight: 70.9 kg (156 lb 4.9 oz) IBW/kg (Calculated) : 79.9 Heparin  Dosing Weight: 70.9 kg  Vital Signs: Temp: 98.5 F (36.9 C) (10/28 1310) Temp Source: Oral (10/28 1310) BP: 115/72 (10/28 1310) Pulse Rate: 89 (10/28 1310)  Labs: Recent Labs    03/25/24 1942 03/25/24 2001 03/26/24 0337 03/26/24 0640 03/26/24 1500  HGB 15.0 15.6 12.6* 13.2  --   HCT 44.2 46.0 37.0* 38.4*  --   PLT 157  --   --  124*  --   HEPARINUNFRC  --   --   --   --  0.32  CREATININE  --  0.90  --  0.69  --     Estimated Creatinine Clearance: 78.8 mL/min (by C-G formula based on SCr of 0.69 mg/dL).   Medical History: Past Medical History:  Diagnosis Date   Aortic stenosis    Moderate AS with mean AVG 10.8, V-max 2.64m/s, SVI 28, DI 0.3   Carotid artery stenosis    1-39% bilaterally by dopplers 11/2022   Dyslipidemia    Hypertension    Inguinal hernia 2010   Small asymptomatic L inguinal Hernia   Permanent atrial fibrillation (HCC)    atrial fib    Pneumonia    hx of    Prostate cancer (HCC) 07/2012   s/p prostatectomy   PSA elevation    workup per Dr. Alm Fragmin, March 2014   Sigmoid diverticulosis    on colonoscopy in 2010   Skin cancer 02/2012   Dr Renay Homer   Tobacco dependence    rarely smokes-3 cigs per week    Assessment: 66 yom with a history of prostate cancer, AF previously on eliquis  but this had been held since recent (September) hospitalization with [facility]. Neurology outpatient notes planning for apixaban  resume 10/28. Patient is presenting with c/f limb ischemia. Heparin  per pharmacy consult placed for limb ischemia.  S/p thrombectomy. Heparin  was restarted this AM. Level came back therapeutic this PM. We will  increase slightly and recheck in AM.  Goal of Therapy:  Heparin  level 0.3-0.7 units/ml Monitor platelets by anticoagulation protocol: Yes   Plan:  Increase heparin  to 1250 units/hr Check heparin  level daily while on heparin  Continue to monitor H&H and platelets F/u with PO AC  Malayiah Mcbrayer, PharmD, BCIDP, AAHIVP, CPP Infectious Disease Pharmacist 03/26/2024 4:43 PM

## 2024-03-26 NOTE — Progress Notes (Signed)
 PHARMACIST LIPID MONITORING   Kyle Kelley is a 78 y.o. male admitted on 03/25/2024 with LLE CLI s/p thrombectomy, angiogram, stent placement.  Pharmacy has been consulted to optimize lipid-lowering therapy with the indication of secondary prevention for clinical ASCVD.  Recent Labs:  Lipid Panel (last 6 months):   Lab Results  Component Value Date   CHOL 110 02/29/2024   TRIG 37 02/29/2024   HDL 41 02/29/2024   CHOLHDL 2.7 02/29/2024   VLDL 7 02/29/2024   LDLCALC 62 02/29/2024    Hepatic function panel (last 6 months):   Lab Results  Component Value Date   AST 33 03/26/2024   ALT 40 03/26/2024   ALKPHOS 51 03/26/2024   BILITOT 0.6 03/26/2024   BILIDIR 0.2 03/26/2024   IBILI 0.4 03/26/2024    SCr (since admission):   Serum creatinine: 0.69 mg/dL 89/71/74 9359 Estimated creatinine clearance: 78.8 mL/min  Current the rapy and lipid therapy tolerance Current lipid-lowering therapy: rosuvastatin  10mg  Previous lipid-lowering therapies (if applicable): n/a Documented or reported allergies or intolerances to lipid-lowering therapies (if applicable): None  Assessment:   Patient agrees with changes to lipid-lowering therapy. Patient requested that Dr. Shlomo be notified, message sent.   Plan:    1.Statin intensity (high intensity recommended for all patients regardless of the LDL):  Add or increase statin to high intensity.  2.Add ezetimibe (if any one of the following):   Not indicated at this time.  3.Refer to lipid clinic:   No  4.Follow-up with:  Cardiology provider - Wilbert Shlomo, MD  5.Follow-up labs after discharge:  Changes in lipid therapy were made. Check a lipid panel in 8-12 weeks then annually.      Jinnie Door, PharmD, BCPS, BCCP Clinical Pharmacist  Please check AMION for all [facility] phone numbers After 10:00 PM, call Main Pharmacy [phone]

## 2024-03-26 NOTE — Op Note (Signed)
 Patient name: Kyle Kelley MRN: [id number] DOB: [birth year] Sex: male  03/26/2024 Pre-operative Diagnosis: Acute left lower extremity [place] 2A limb threatening ischemia Post-operative diagnosis:  Same Surgeon:  Penne BROCKS. Sheree, MD There were Lucie Apt, PA Procedure Performed: 1.  Left lower extremity thromboembolectomy via common femoral and below-knee popliteal artery exposure incisions using 3 and 4 Fogarty balloons 2.  Left tibioperoneal trunk endarterectomy with vein patch angioplasty 3.  Harvest left greater saphenous vein 4.  Left lower extremity angiogram 5.  Stent left SFA with 6 x 150 mm Eluvia   Indications: 78 year old male with history of atrial fibrillation off of anticoagulation for recent intracranial hemorrhage.  He was scheduled to restart Eliquis  the day after presentation to the hospital for acute left lower extremity limb-threatening ischemia initially with motor deficit after heparinization was reduced to mild sensory deficits.  CT angio demonstrates occlusion of the left common femoral artery with reconstitution throughout but what appears to be chronic disease to the left SFA and at the tibial trifurcation.  We have discussed the options including risk benefits of the procedure and he demonstrates good understanding and agrees to proceed.  Findings: The left common femoral artery was soft there was some posterior plaque.  A transverse arteriotomy was created there was some chronic appearing thrombus removed from the iliac arteries really minimal thrombus return from the SFA and profunda and there was strong backbleeding from the profunda.  Below the knee the tibioperoneal trunk was severely calcified as was the takeoff of the anterior tibial artery and the bifurcation of the tibioperoneal trunk.  There was acute appearing thrombus sitting just above the calcification into the anterior tibial artery there was also thrombus in both the posterior tibial artery and  peroneal arteries proximally.  We performed endarterectomy and harvested the great saphenous vein as vein patch angioplasty of the tibioperoneal trunk to incorporate all 3 tibial vessels.  There was strong backbleeding from the anterior tibial and posterior tibial arteries minimal backbleeding from the peroneal artery and after completion there was a palpable posterior tibial artery at the ankle.  On left lower extremity angiogram we demonstrated what appeared to be chronic disease throughout the SFA appeared to have dissection from passing the Fogarty's and this was stented to 0% residual stenosis and no dissection.  The posterior tibial artery was abdominal runoff with anterior tibial artery runoff leg and behind the posterior tibial artery did fill the left medial lateral plantar branches.  At completion there was a palpable posterior tibial artery pulse at the ankle and this was confirmed with Doppler and there was a weaker anterior tibial artery signal at the ankle as well.   Procedure:  The patient was identified in the holding area and taken to the operating room where he was close upon operative table general anesthesia was induced.  He was through the prep drape to the right, and left lower extremity in usual fashion, antibiotics were administered and a timeout was called.  We began with a vertical incision in the left groin dissected down to the left common femoral artery and encircled the common femoral artery proximally as well as the profunda and SFA and the patient was fully heparinized.  Transverse arteriotomy was then created at the right common femoral bifurcation.  We passed a 4 Fogarty proximally distally down both the SFA and profunda and only returned some chronic appearing thrombus from the iliac arteries with minimal return from the SFA and profunda and the transverse arteriotomy  was closed with running 5-0 Prolene suture.  There was notable improved pulsatility in the left common femoral  artery from the preoperative exam.  We turned our attention to the distal leg where we made a vertical incision dissected down through the skin and soft tissue into the popliteal fossa encircled the popliteal vein and retracted this medially.  The popliteal artery was identified encircled with Vesseloops proximally.  There was no pulsatility in this artery past the knee.  There was heavy calcification of the tibioperoneal trunk and we dissected down and did divide one of the paired popliteal veins for better exposure and encircled the peroneal and posterior tibial arteries with Vesseloops as well.  I elected for longitudinal arteriotomy given the patient was likely to require endarterectomy.  We then did perform endarterectomy from the anterior tibial artery takeoff all the way down to the tibioperoneal trunk bifurcation.  We passed 3 Fogarty down the anterior tibial artery as well as peroneal and posterior tibial arteries.  We did have some thrombus sitting right at the tibioperoneal trunk bifurcation there was also a tunnel thrombus extending into the anterior tibial artery.  We stated the strong backbleeding from the anterior tibial and posterior tibial arteries and these were clamped.  Through the same incision we harvested approximately 7 cm of great saphenous vein.  This was spatulated reversed and sewn in place as patch angioplasty with 6-0 Prolene suture.  Prior completion without flushing in all directions.  At completion there was a palpable pulse in the posterior tibial artery within the wound that could be also palpated at the ankle and was confirmed with Doppler.  The common femoral artery was then cannulated with a micropuncture needle followed by wire sheath and left lower extremity angiography was performed with the above findings.  I then placed a Rosen wire followed by a long 6 French sheath and the SFA was primarily stented with a 6 x 150 mm Eluvia postdilated with 5 mm balloon.  At completion  there was brisk flow through the SFA where previously the chronic disease dissection from passing the Fogarty.  Palpable posterior tibial artery pulse at the ankle confirmed with Doppler again.  Satisfied with this we irrigated the wounds obtained stasis and closed in layers with Vicryl and staples.  Patient was then awakened from anesthesia having tolerated the procedure without any complication.  All counts were correct at completion.  EBL: 200 cc  Contrast: 70 cc   Xaivier Malay C. Sheree, MD [facility] Office: [phone] Pager: [phone]

## 2024-03-26 NOTE — Transfer of Care (Signed)
 Immediate Anesthesia Transfer of Care Note  Patient: Kyle Kelley  Procedure(s) Performed: THROMBECTOMY, ARTERY, LEFT FEMORAL AND LOWER EXTREMITY (Left) ANGIOGRAM, LOWER EXTREMITY (Left) SURGICAL PROCUREMENT, LEFT LOWER SAPHENOUS VEIN (Left: Leg Lower) INSERTION, STENT, VASCULAR, LEFT PT TRUNK (Left) ENDARTERECTOMY, LEFT PT TRUNK WITH PATCH ANGIOPLASTY  Patient Location: PACU  Anesthesia Type:General  Level of Consciousness: awake, alert , and oriented  Airway & Oxygen Therapy: Patient Spontanous Breathing and Patient connected to nasal cannula oxygen  Post-op Assessment: Report given to RN, Post -op Vital signs reviewed and stable, and Patient moving all extremities  Post vital signs: Reviewed and stable  Last Vitals:  Vitals Value Taken Time  BP 137/72 03/26/24 05:45  Temp    Pulse 74 03/26/24 05:47  Resp 12 03/26/24 05:47  SpO2 100 % 03/26/24 05:47  Vitals shown include unfiled device data.  Last Pain:  Vitals:   03/25/24 1934  TempSrc: Oral         Complications: No notable events documented.

## 2024-03-26 NOTE — H&P (Signed)
 History and Physical    [name] FMW:[id number] DOB: 27-Aug-1945 DOA: 03/25/2024  Patient coming from: Home.  Chief Complaint: Left leg pain and numbness.  HPI: ROSHAWN Kelley is a 78 y.o. male with history of prostate cancer s/p resection in 2014 and radiation for recurrence on 2018, A-fib on Eliquis  was admitted on February 28, 2024 for intracerebral hemorrhage at that time patient's Eliquis  was held and had followed up with neurologist yesterday when neurologist planned to start Eliquis  from today had followed up with PT OT when patient started experiencing left lower extremity pain and numbness and was referred to the ER.  ED Course: In the ER patient had CT angiogram of the lower extremity which showed left lower extremity arterial occlusive disease with thrombosis of the common femoral artery and popliteal artery.  Dr. Sheree vascular surgeon was consulted and patient was taken to the OR and underwent left lower extremity thromboembolectomy and left tibial peroneal trunk endarterectomy with vein patch angioplasty.  Stent of left SFA.  Hospitalist was requested for admission.  At the time of my exam patient is just come out of the OR and is getting more alert and awake following commands.  Initial labs drawn in the ER did show hyponatremia of sodium 127 and potassium 5.7.  We are repeating stat labs.  Review of Systems: As per HPI, rest all negative.   Past Medical History:  Diagnosis Date   Aortic stenosis    Moderate AS with mean AVG 10.8, V-max 2.5m/s, SVI 28, DI 0.3   Carotid artery stenosis    1-39% bilaterally by dopplers 11/2022   Dyslipidemia    Hypertension    Inguinal hernia 2010   Small asymptomatic L inguinal Hernia   Permanent atrial fibrillation (HCC)    atrial fib    Pneumonia    hx of    Prostate cancer (HCC) 07/2012   s/p prostatectomy   PSA elevation    workup per Dr. Alm Fragmin, March 2014   Sigmoid diverticulosis    on colonoscopy in 2010   Skin cancer  02/2012   Dr Renay Homer   Tobacco dependence    rarely smokes-3 cigs per week    Past Surgical History:  Procedure Laterality Date   CARDIOVERSION  08/05/2005   Direct Current Cardioversion   COLONOSCOPY  2010   Direct current Cardioversion  08/05/2005   PROSTATE BIOPSY  09/06/2012   Dr. Alm Fragmin   ROBOT ASSISTED LAPAROSCOPIC RADICAL PROSTATECTOMY N/A 10/31/2012   Procedure: ROBOTIC ASSISTED LAPAROSCOPIC RADICAL PROSTATECTOMY;  Surgeon: Alm GORMAN Fragmin, MD;  Location: [facility];  Service: Urology;  Laterality: N/A;   TONSILLECTOMY AND ADENOIDECTOMY     TONSILLECTOMY AND ADENOIDECTOMY       reports that he has been smoking cigarettes. He has a 25 pack-year smoking history. He has never used smokeless tobacco. He reports current alcohol use. He reports that he does not use drugs.  Allergies  Allergen Reactions   Ace Inhibitors Anaphylaxis   Rythmol [Propafenone] Shortness Of Breath   Nitrofurantoin Rash    Family History  Problem Relation Age of Onset   Hypertension Mother    Heart disease Father    Hyperlipidemia Father    Cancer Father        prostate    Prior to Admission medications   Medication Sig Start Date End Date Taking? Authorizing Provider  Cholecalciferol (VITAMIN D3) 25 MCG (1000 UT) CAPS Take 1,000 Units by mouth every other day.  [provider]  diltiazem  (TIAZAC ) 300 MG 24 hr capsule Take 300 mg by mouth daily.    [provider]  EPINEPHrine  (EPIPEN  IJ) Inject 1 application as directed as needed. Patient not taking: Reported on 03/25/2024    [provider]  loratadine  (CLARITIN ) 10 MG tablet Take 10 mg by mouth daily as needed for allergies. Patient not taking: Reported on 03/25/2024    [provider]  metoprolol  succinate (TOPROL -XL) 25 MG 24 hr tablet Take 1 tablet (25 mg total) by mouth daily. 12/25/23   Shlomo Wilbert SAUNDERS, MD  rosuvastatin  (CRESTOR ) 10 MG tablet Take 1 tablet (10 mg total) by mouth daily. 12/25/23    Shlomo Wilbert SAUNDERS, MD    Physical Exam: Constitutional: Moderately built and nourished. Vitals:   03/25/24 2000 03/25/24 2013 03/26/24 0540 03/26/24 0555  BP: (!) 175/164 (!) 167/78 (!) 155/88 138/76  Pulse: 72  76 74  Resp: (!) 24  17 14   Temp:   (!) 97.5 F (36.4 C)   TempSrc:      SpO2: 99%  100% 100%  Weight:      Height:       Eyes: Anicteric no pallor. ENMT: No discharge from the ears eyes nose or mouth. Neck: No mass felt.  No neck rigidity. Respiratory: No rhonchi or crepitations. Cardiovascular: S1-S2 heard. Abdomen: Soft nontender bowel sound present. Musculoskeletal: Left leg is in dressing.  Pulses full. Skin: Postop changes in the left leg. Neurologic: Patient is lethargic but easily arousable is postop.  Following commands and moving all extremities.  Pupils reacting to light. Psychiatric: Patient lethargic after postop.   Labs on Admission: I have personally reviewed following labs and imaging studies  CBC: Recent Labs  Lab 03/25/24 1942 03/25/24 2001  WBC 10.8*  --   NEUTROABS 8.6*  --   HGB 15.0 15.6  HCT 44.2 46.0  MCV 90.9  --   PLT 157  --    Basic Metabolic Panel: Recent Labs  Lab 03/25/24 2001  NA 127*  K 5.7*  CL 94*  GLUCOSE 105*  BUN 28*  CREATININE 0.90   GFR: Estimated Creatinine Clearance: 70 mL/min (by C-G formula based on SCr of 0.9 mg/dL). Liver Function Tests: No results for input(s): AST, ALT, ALKPHOS, BILITOT, PROT, ALBUMIN in the last 168 hours. No results for input(s): LIPASE, AMYLASE in the last 168 hours. No results for input(s): AMMONIA in the last 168 hours. Coagulation Profile: No results for input(s): INR, PROTIME in the last 168 hours. Cardiac Enzymes: No results for input(s): CKTOTAL, CKMB, CKMBINDEX, TROPONINI in the last 168 hours. BNP (last 3 results) No results for input(s): PROBNP in the last 8760 hours. HbA1C: No results for input(s): HGBA1C in the last 72  hours. CBG: No results for input(s): GLUCAP in the last 168 hours. Lipid Profile: No results for input(s): CHOL, HDL, LDLCALC, TRIG, CHOLHDL, LDLDIRECT in the last 72 hours. Thyroid  Function Tests: No results for input(s): TSH, T4TOTAL, FREET4, T3FREE, THYROIDAB in the last 72 hours. Anemia Panel: No results for input(s): VITAMINB12, FOLATE, FERRITIN, TIBC, IRON, RETICCTPCT in the last 72 hours. Urine analysis:    Component Value Date/Time   COLORURINE YELLOW 02/27/2024 2142   APPEARANCEUR CLEAR 02/27/2024 2142   LABSPEC 1.023 02/27/2024 2142   PHURINE 5.0 02/27/2024 2142   GLUCOSEU NEGATIVE 02/27/2024 2142   HGBUR NEGATIVE 02/27/2024 2142   BILIRUBINUR NEGATIVE 02/27/2024 2142   KETONESUR 5 (A) 02/27/2024 2142   PROTEINUR NEGATIVE 02/27/2024 2142  NITRITE NEGATIVE 02/27/2024 2142   LEUKOCYTESUR NEGATIVE 02/27/2024 2142   Sepsis Labs: @LABRCNTIP (procalcitonin:4,lacticidven:4) )No results found for this or any previous visit (from the past 240 hours).   Radiological Exams on Admission: HYBRID OR IMAGING ([facility] ONLY) Result Date: 03/25/2024 There is no interpretation for this exam.  This order is for images obtained during a surgical procedure.  Please See Surgeries Tab for more information regarding the procedure.   CT ANGIO LOWER EXT BILAT W &/OR WO CONTRAST Result Date: 03/25/2024 EXAM: CTA BILATERAL LOWER EXTREMITY 03/25/2024 08:47:16 PM TECHNIQUE: Contrast-enhanced computed tomography angiography of the lower extremity was performed with multiplanar reconstructions. Maximum intensity projection images were created on a separate workstation and reviewed. Automated exposure control, iterative reconstruction, and/or weight based adjustment of the mA/kV was utilized to reduce the radiation dose to as low as reasonably achievable. COMPARISON: None available. CLINICAL HISTORY: FINDINGS: ARTERIAL: LEFT COMMON ILIAC ARTERY: The left common iliac  artery demonstrates up to 50% irregular stenosis with patchy calcific plaques. There is up to a 75% origin stenosis in the internal iliac artery, moderate patchy irregular stenosis in the remainder. LEFT EXTERNAL ILIAC ARTERY: Left external iliac artery demonstrates patchy calcifications without flow-limiting stenosis. COMMON FEMORAL ARTERY: There is vessel thrombosis occluding the left common femoral artery and the origin of the left deep femoral artery. The left deep femoral artery reconstitutes just past the occluded origin with good flow in the remainder of the vessel. The left lateral circumflex femoral artery is patent. The left medial circumflex femoral artery is occluded in the proximal 1 cm of the vessel then reconstitutes. SUPERFICIAL FEMORAL ARTERY: There is thrombosis occluding the left superficial femoral artery origin with vessel reconstitution into the proximal thigh then complete occlusion down to the Hunter's canal segment which reconstitutes with 60% stenosis. There are patchy calcific plaques along its course. POPLITEAL ARTERY: There is patchy calcification in the left popliteal artery with thrombosis occluding the vessel. TIBIOPERONEAL TRUNK: No arterial enhancement can be detected in the tibioperoneal trunk. ANTERIOR TIBIAL ARTERY: No arterial enhancement can be detected in the anterior tibial artery. No flow identified in the dorsalis pedis artery. PERONEAL ARTERY: No arterial enhancement can be detected in the peroneal artery. POSTERIOR TIBIAL ARTERY: No arterial enhancement can be detected in the posterior tibial artery. RIGHT COMMON ILIAC ARTERY: On the right, there are patchy vessel wall calcifications in the common iliac, internal and external iliac arteries but no flow limiting stenoses. RIGHT EXTERNAL ILIAC ARTERY: no flow limiting stenoses. RIGHT COMMON FEMORAL ARTERY: The right common femoral artery is patent with mild calcification posteriorly. The right deep femoral artery is patent  with mild patchy calcifications. RIGHT SUPERFICIAL FEMORAL ARTERY: The right superficial femoral artery is patent with mild patchy calcifications. RIGHT POPLITEAL ARTERY: Right popliteal artery is patent. RIGHT TIBIOPERONEAL TRUNK: There are calcifications in the tibioperoneal trunk and proximal right anterior tibial artery with unobstructed flow in the right anterior and posterior tibial arteries into the foot. RIGHT ANTERIOR TIBIAL ARTERY: Calcifications in the proximal right anterior tibial artery with unobstructed flow into the foot. RIGHT PERONEAL ARTERY: Right peroneal artery is occluded in the proximal 1 to 2 cm but reconstitutes in the upper calf with flow to the ankle. RIGHT POSTERIOR TIBIAL ARTERY: Unobstructed flow into the foot. BONES AND SOFT TISSUES: Advanced facet hypertrophy in the lower lumbar spine. Mild arthrosis of the hips. Asymmetric degenerative arthrosis of the right femorotibial joint, greatest laterally with a large loose body in the anterior right knee joint. No acute  or significant osseous findings. BOWEL: Mucosal enhancement in multiple lower abdominal and pelvic small bowel segments consistent with nonspecific enteritis. No mesenteric inflammatory changes. Advanced sigmoid diverticulosis without evidence of diverticulitis. PELVIS: The bladder and prostate are unremarkable. No pelvic fluid collections or masses. Bilateral inguinal fat hernias. IMPRESSION: 1. Left lower extremity arterial occlusive disease with thrombosis of the common femoral artery and popliteal artery, mostly occluded superficial femoral artery with segmental reconstitution and 60% stenosis in Hunters canal, and no popliteal trifurcation arterial enhancement, consistent with critical limb ischemia risk. 2. Right peroneal artery occlusion proximally with reconstitution and preserved distal tibial runoff. No other flow-limiting stenosis in the right lower extremity. 3. Advanced sigmoid diverticulosis without  diverticulitis. 4. Nonspecific enteritis involving multiple lower abdominal and pelvic small bowel segments without mesenteric inflammatory changes. 5. Asymmetric osteoarthritis in the right knee with large loose body anteriorly. Electronically signed by: Francis Quam MD 03/25/2024 09:32 PM EDT RP Workstation: [id number]    EKG: Independently reviewed.  Monitor showing A-fib.  Assessment/Plan Principal Problem:   Critical limb ischemia of left lower extremity (HCC) Active Problems:   Permanent atrial fibrillation (HCC)   Essential hypertension, benign   Malignant neoplasm of prostate (HCC)   ICH (intracerebral hemorrhage) (HCC)   Mixed hyperlipidemia   Status post surgery    Critical limb ischemia of the left lower extremity status post thromboembolectomy and left tibial peroneal endarterectomy patch angioplasty and stent placement of SFA.  Patient is to be on heparin  infusion Plavix per vascular surgery. A-fib rate controlled on Cardizem  and metoprolol .  Presently on heparin  infusion. Hypertension on Cardizem  and metoprolol . Hyperlipidemia on statins. History of recent intracranial hemorrhage was admitted on February 28, 2024.  Neurologist had cleared him for restarting anticoagulation yesterday. Prior history of prostate cancer.  Repeat labs are pending.  The initial one in the ER was showing hyponatremia and hyperkalemia.   DVT prophylaxis: Heparin  infusion. Code Status: Full code. Family Communication: Patient's wife. Disposition Plan: Progressive care. Consults called: Vascular surgery. Admission status: Inpatient.

## 2024-03-26 NOTE — Progress Notes (Signed)
 Patient seen and examined personally, I reviewed the chart, history and physical and admission note, done by admitting physician this morning and agree with the same with following addendum.  Please refer to the morning admission note for more detailed plan of care.  Briefly,  Kyle Kelley is a 78 y.o. male with PMH of prostate cancer s/p resection in 2014 and radiation for recurrence on 2018, A-fib on Eliquis  was admitted on February 28, 2024 for intracerebral hemorrhage at that time patient's Eliquis  was held and had followed up with neurologist  10/27 when neurologist planned to start Eliquis , followed up with PT OT when patient started experiencing left lower extremity pain and numbness and was referred to the ER.   In the ERL: CT angiogram of the lower extremity showed left lower extremity arterial occlusive disease with thrombosis of the common femoral artery and popliteal artery.  Dr. Sheree vascular surgeon was consulted and patient was taken to the OR and underwent left lower extremity thromboembolectomy and left tibial peroneal trunk endarterectomy with vein patch angioplasty ,stent of left SFA. initial labs drawn in the ER did show hyponatremia of sodium 127 and potassium 5.7.   Subjective: Same-day admission note Seen and examined today Resting comfortably wife at the bedside, pulses present on the left lower extremity with Doppler nursing at bedside Overnight on 3 L nasal cannula afebrile, VSS, Labs stable CMP with mild hyponatremia stable CBC with mild thrombocytopenia  Assessment and plan:  Critical limb ischemia of the  LLE: S/P thromboembolectomy and left tibial peroneal endarterectomy patch angioplasty and stent placement of SFA 10/27. Continue heparin  infusion, Plavix and further plan per vascular surgery.  Monitor distal pulses.  A-fib:' rate controlled on Cardizem  and metoprolol .  Hypertension: Stable, cont Cardizem  and metoprolol .  Hyperlipidemia on statins.  History  of recent intracranial hemorrhage was admitted on February 28, 2024. Neurologist had cleared him for restarting anticoagulation on 10/27  Prior history of prostate cancer  DVT prophylaxis: Heparin  infusion Code Status:   Code Status: Full Code Family Communication: plan of care discussed with patient/3wife at bedside. Patient status is: Remains hospitalized because of severity of illness Level of care: Progressive   Dispo: The patient is from: home            Anticipated disposition: TBD Objective: Vitals last 24 hrs: Vitals:   03/26/24 0540 03/26/24 0555 03/26/24 0610 03/26/24 0638  BP: (!) 155/88 138/76 136/75 (!) 147/73  Pulse: 76 74 71 74  Resp: 17 14 13 16   Temp: (!) 97.5 F (36.4 C)  97.9 F (36.6 C) 98 F (36.7 C)  TempSrc:    Oral  SpO2: 100% 100% 100% 100%  Weight:      Height:        Physical Examination: General exam: alert awake, oriented, older than stated age HEENT:Oral mucosa moist, Ear/Nose WNL grossly Respiratory system: Bilaterally clear BS,no use of accessory muscle Cardiovascular system: S1 & S2 +, No JVD. Gastrointestinal system: Abdomen soft,NT,ND, BS+ Nervous System: Alert, awake, moving all extremities,and following commands. Extremities: extremities warm, leg edema neg, left lower extremity warm foot pulses intact surgical site dressing C/D/I Skin: Warm, no rashes MSK: Normal muscle bulk,tone, power   Medications reviewed:  Scheduled Meds:  sodium chloride    Intravenous Once   diltiazem   300 mg Oral Daily   metoprolol  succinate  25 mg Oral Daily   Continuous Infusions:  heparin  1,200 Units/hr (03/26/24 9357)   Diet: Diet Order     None

## 2024-03-26 NOTE — Anesthesia Procedure Notes (Signed)
 Procedure Name: Intubation Date/Time: 03/26/2024 2:39 AM  Performed by: Tambria Pfannenstiel T, CRNAPre-anesthesia Checklist: Patient identified, Emergency Drugs available, Suction available and Patient being monitored Patient Re-evaluated:Patient Re-evaluated prior to induction Oxygen Delivery Method: Circle system utilized Preoxygenation: Pre-oxygenation with 100% oxygen Induction Type: IV induction Ventilation: Mask ventilation without difficulty Laryngoscope Size: Mac and 4 Grade View: Grade I Tube type: Oral Tube size: 7.5 mm Number of attempts: 1 Airway Equipment and Method: Stylet and Oral airway Placement Confirmation: ETT inserted through vocal cords under direct vision, positive ETCO2 and breath sounds checked- equal and bilateral Secured at: 23 cm Tube secured with: Tape Dental Injury: Teeth and Oropharynx as per pre-operative assessment

## 2024-03-26 NOTE — Progress Notes (Signed)
    Left posterior tibial artery pulse remains palpable.  He has mild to moderate swelling below the knee however compartments are soft and he is without pain with active or passive motion.  Left foot is sensorimotor intact and is warm.  Continue heparin  will need transition to Eliquis  prior to discharge as well as Plavix for drug-eluting stent placement.  [name], MD

## 2024-03-26 NOTE — Progress Notes (Signed)
    Patient operation was placed on hold secondary to separate emergency operation.  I reevaluated him in the preop holding area and his left lower extremity is now warm from the knee down to the level of the foot which is even somewhat warmer and his motor function appears to have completely returned with only some loss of sensation consistent with [place] 2A ischemia.  We have discussed options and will proceed with operation tonight as previously discussed with left lower extremity thrombectomy, angiography and possible bypass but likely will hold on fasciotomies given his improved level of ischemia.  All questions were answered he demonstrates good understanding.  Laron Boorman C. Sheree, MD [facility] Office: [phone] Pager: [phone]

## 2024-03-26 NOTE — Anesthesia Postprocedure Evaluation (Signed)
 Anesthesia Post Note  Patient: Kyle Kelley  Procedure(s) Performed: THROMBECTOMY, LEFT FEMORAL TO BELOW KNEE ARTERY (Left) ANGIOGRAM, LOWER EXTREMITY (Left) SURGICAL PROCUREMENT, LEFT LOWER SAPHENOUS VEIN (Left: Leg Lower) INSERTION, STENT, VASCULAR, LEFT SUPERFICAL FEMORAL ARTERY (Left) ENDARTERECTOMY, LEFT TP TRUNK WITH PATCH ANGIOPLASTY     Patient location during evaluation: PACU Anesthesia Type: General Level of consciousness: awake and alert Pain management: pain level controlled Vital Signs Assessment: post-procedure vital signs reviewed and stable Respiratory status: spontaneous breathing, nonlabored ventilation, respiratory function stable and patient connected to nasal cannula oxygen Cardiovascular status: blood pressure returned to baseline and stable Postop Assessment: no apparent nausea or vomiting Anesthetic complications: no   No notable events documented.  Last Vitals:  Vitals:   03/26/24 0610 03/26/24 0638  BP: 136/75 (!) 147/73  Pulse: 71 74  Resp: 13 16  Temp: 36.6 C 36.7 C  SpO2: 100% 100%    Last Pain:  Vitals:   03/26/24 9361  TempSrc: Oral  PainSc:                  Garnette FORBES [place]

## 2024-03-26 NOTE — Hospital Course (Addendum)
 Kyle Kelley is a 78 y.o. male with PMH of prostate cancer s/p resection in 2014 and radiation for recurrence on 2018, A-fib on Eliquis  was admitted on February 28, 2024 for intracerebral hemorrhage at that time patient's Eliquis  was held and had followed up with neurologist  10/27 when neurologist planned to start Eliquis , followed up with PT OT when patient started experiencing left lower extremity pain and numbness and was referred to the ER.   In the ERL: CT angiogram of the lower extremity showed left lower extremity arterial occlusive disease with thrombosis of the common femoral artery and popliteal artery.  Dr. Sheree vascular surgeon was consulted and patient was taken to the OR and underwent left lower extremity thromboembolectomy and left tibial peroneal trunk endarterectomy with vein patch angioplasty ,stent of left SFA. initial labs drawn in the ER did show hyponatremia of sodium 127 and potassium 5.7.  Postop doing well, past surgery has cleared the patient for discharge and transition heparin  to Eliquis  upon discharge  Subjective: Seen and examined Overnight afebrile BP stable labs reviewed stable renal function CBC with mild anemia and thrombocytopenia hemoglobin 12.1> 11.3  Discharge Diagnoses:   Critical limb ischemia of the  LLE: S/P thromboembolectomy and left tibial peroneal endarterectomy patch angioplasty and stent placement of SFA 10/27. Vascular surgery following closely, patient now on Plavix, Crestor . Monitor distal pulses. Postop doing well, past surgery has cleared the patient for discharge and transition heparin  to Eliquis  upon discharge He is also now on plavix.  A-fib: rate controlled on Cardizem  and metoprolol .  Hypertension: Controlled.  Continue Cardizem  and metoprolol .  Hyperlipidemia Continue Crestor   History of recent intracranial hemorrhage was admitted on February 28, 2024. Neurologist had cleared him for restarting anticoagulation on 10/27. Continue  Plavix  Prior history of prostate cancer  DVT prophylaxis: SCD's Start: 03/26/24 0835Heparin infusion Code Status:   Code Status: Full Code Family Communication: plan of care discussed with patient/3wife at bedside. Patient status is: Remains hospitalized because of severity of illness Level of care: Progressive   Dispo: The patient is from: home            Anticipated disposition: home  Objective: Vitals last 24 hrs: Vitals:   03/27/24 1941 03/27/24 2317 03/28/24 0327 03/28/24 0754  BP: (!) 115/54 126/69 111/60 113/62  Pulse: 75 88 81 80  Resp: 18 18 17 18   Temp: 98.8 F (37.1 C) 98 F (36.7 C) 98.2 F (36.8 C) 97.6 F (36.4 C)  TempSrc: Oral Oral Oral Oral  SpO2: 98% 100%  100%  Weight:      Height:        Physical Examination: General exam: alert awake, oriented HEENT:Oral mucosa moist, Ear/Nose WNL grossly Respiratory system: Bilaterally clear BS,no use of accessory muscle Cardiovascular system: S1 & S2 +, No JVD. Gastrointestinal system: Abdomen soft,NT,ND, BS+ Nervous System: Alert, awake, moving all extremities,and following commands. Extremities: extremities warm, leg edema neg, left lower extremity dressing has been removed, sutures in place mild oozing clean no foul smeel Skin: Warm, no rashes MSK: Normal muscle bulk,tone, power

## 2024-03-26 NOTE — Evaluation (Signed)
 Physical Therapy Evaluation Patient Details Name: Kyle Kelley MRN: [id number] DOB: 11/26/1945 Today's Date: 03/26/2024  History of Present Illness  78 y.o. male admitted 10/27 with critical limb ischemia of the left lower extremity status post thromboembolectomy and left tibial peroneal endarterectomy patch angioplasty and stent placement of SFA. Past medical history of prostate cancer s/p resection in 2014 and radiation for recurrence on 2018, A-fib on Eliquis  was admitted on February 28, 2024 for intracerebral hemorrhage.  Clinical Impression  Patient is s/p above surgery resulting in functional limitations due to the deficits listed below (see PT Problem List). Previously independent, had been working with outpatient neuro-rehab following ICH but feels he had made rapid and significant progress. During evaluation today Pt required CGA for bed mobility, transfer, and gait using RW for support. Pain in LLE. Bandages without drainage. VSS on RA (reapplied end of session.) Anticipate quick recovery. Pt eager to return home. Patient will benefit from acute skilled PT to increase their independence and safety with mobility to facilitate discharge.         If plan is discharge home, recommend the following: A little help with walking and/or transfers;A little help with bathing/dressing/bathroom;Assistance with cooking/housework;Assist for transportation;Help with stairs or ramp for entrance   Can travel by private vehicle        Equipment Recommendations None recommended by PT  Recommendations for Other Services       Functional Status Assessment Patient has had a recent decline in their functional status and demonstrates the ability to make significant improvements in function in a reasonable and predictable amount of time.     Precautions / Restrictions Precautions Precautions: Fall Recall of Precautions/Restrictions: Intact Restrictions Weight Bearing Restrictions Per Provider Order:  No      Mobility  Bed Mobility Overal bed mobility: Needs Assistance Bed Mobility: Supine to Sit     Supine to sit: Contact guard, HOB elevated     General bed mobility comments: CGA for safety, HOB elevated, extra time and minor cues for technique, no physical assist.    Transfers Overall transfer level: Needs assistance Equipment used: Rolling walker (2 wheels) Transfers: Sit to/from Stand Sit to Stand: Contact guard assist           General transfer comment: CGA for safety, slow to rise, cues for hand placement. Good control with descent. stabilizes with RW for light support.    Ambulation/Gait Ambulation/Gait assistance: Contact guard assist Gait Distance (Feet): 30 Feet Assistive device: Rolling walker (2 wheels) Gait Pattern/deviations: Step-through pattern, Decreased stance time - left, Decreased stride length, Antalgic, Trunk flexed Gait velocity: dec Gait velocity interpretation: <1.31 ft/sec, indicative of household ambulator   General Gait Details: Cues for upright posture and RW use including proximity and sequencing to improve gait symmetry and  Stairs            Wheelchair Mobility     Tilt Bed    Modified Rankin (Stroke Patients Only)       Balance Overall balance assessment: Needs assistance Sitting-balance support: No upper extremity supported, Feet supported Sitting balance-Leahy Scale: Good     Standing balance support: Bilateral upper extremity supported, During functional activity, Reliant on assistive device for balance Standing balance-Leahy Scale: Poor                               Pertinent Vitals/Pain Pain Assessment Pain Assessment: Faces Faces Pain Scale: Hurts little more Pain Location: LLE  Pain Descriptors / Indicators: Aching Pain Intervention(s): Limited activity within patient's tolerance, Monitored during session, Repositioned    Home Living Family/patient expects to be discharged to:: Private  residence Living Arrangements: Spouse/significant other Available Help at Discharge: Family;Available 24 hours/day Type of Home: House Home Access: Stairs to enter Entrance Stairs-Rails: Left Entrance Stairs-Number of Steps: 2-3   Home Layout: One level Home Equipment: Agricultural Consultant (2 wheels);Cane - single point;Grab bars - tub/shower      Prior Function Prior Level of Function : Independent/Modified Independent;Driving             Mobility Comments: Has been doing OP neuro rehab - states good progress and was planning to stop going. ADLs Comments: ind     Extremity/Trunk Assessment   Upper Extremity Assessment Upper Extremity Assessment: Defer to OT evaluation    Lower Extremity Assessment Lower Extremity Assessment: LLE deficits/detail LLE Deficits / Details: Post-op guarding as expected. Groin and medial limb without drainage       Communication   Communication Communication: Impaired Factors Affecting Communication: Hearing impaired    Cognition Arousal: Alert Behavior During Therapy: WFL for tasks assessed/performed   PT - Cognitive impairments: No apparent impairments                         Following commands: Intact       Cueing Cueing Techniques: Verbal cues, Gestural cues     General Comments General comments (skin integrity, edema, etc.): Spo2 95% on RA, HR 78, BP 115/57. Denies dizziness.    Exercises General Exercises - Lower Extremity Ankle Circles/Pumps: AROM, Both, 10 reps, Supine Quad Sets: Strengthening, Both, 10 reps, Supine Gluteal Sets: Strengthening, Both, 10 reps, Supine   Assessment/Plan    PT Assessment Patient needs continued PT services  PT Problem List Decreased strength;Decreased range of motion;Decreased activity tolerance;Decreased balance;Decreased mobility;Decreased knowledge of use of DME;Decreased knowledge of precautions;Pain       PT Treatment Interventions DME instruction;Gait training;Stair  training;Functional mobility training;Therapeutic exercise;Therapeutic activities;Balance training;Neuromuscular re-education;Patient/family education;Modalities    PT Goals (Current goals can be found in the Care Plan section)  Acute Rehab PT Goals Patient Stated Goal: Get well return home PT Goal Formulation: With patient Time For Goal Achievement: 04/10/24 Potential to Achieve Goals: Good    Frequency Min 2X/week     Co-evaluation               AM-PAC PT 6 Clicks Mobility  Outcome Measure Help needed turning from your back to your side while in a flat bed without using bedrails?: None Help needed moving from lying on your back to sitting on the side of a flat bed without using bedrails?: A Little Help needed moving to and from a bed to a chair (including a wheelchair)?: A Little Help needed standing up from a chair using your arms (e.g., wheelchair or bedside chair)?: A Little Help needed to walk in hospital room?: A Little Help needed climbing 3-5 steps with a railing? : A Little 6 Click Score: 19    End of Session Equipment Utilized During Treatment: Gait belt Activity Tolerance: Patient tolerated treatment well Patient left: in chair;with call bell/phone within reach;with chair alarm set (Lab in room) Nurse Communication: Mobility status PT Visit Diagnosis: Unsteadiness on feet (R26.81);Other abnormalities of gait and mobility (R26.89);Muscle weakness (generalized) (M62.81);Difficulty in walking, not elsewhere classified (R26.2);Pain Pain - Right/Left: Left Pain - part of body: Leg    Time: 8490-8463 PT Time Calculation (  min) (ACUTE ONLY): 27 min   Charges:   PT Evaluation $PT Eval Low Complexity: 1 Low PT Treatments $Therapeutic Activity: 8-22 mins PT General Charges $$ ACUTE PT VISIT: 1 Visit         Leontine Roads, PT, DPT [facility]  Rehabilitation Services Physical Therapist Office: [phone] Website: [email or web address]   Leontine GORMAN Roads 03/26/2024, 4:45 PM

## 2024-03-26 NOTE — Progress Notes (Signed)
    Patient evaluated in room.  He is alert and oriented.  Left extremity compartments are soft there is a palpable posterior tibial pulse at the ankle brisk capillary refill in the foot.  Will continue heparin  drip and Plavix.  Wife updated by phone.  Hershy Flenner C. Sheree, MD [facility] [place] Office: [phone] Pager: [phone]

## 2024-03-27 ENCOUNTER — Ambulatory Visit: Admitting: Physical Therapy

## 2024-03-27 DIAGNOSIS — Z95828 Presence of other vascular implants and grafts: Secondary | ICD-10-CM

## 2024-03-27 DIAGNOSIS — Z9889 Other specified postprocedural states: Secondary | ICD-10-CM

## 2024-03-27 DIAGNOSIS — I70222 Atherosclerosis of native arteries of extremities with rest pain, left leg: Secondary | ICD-10-CM | POA: Diagnosis not present

## 2024-03-27 LAB — COMPREHENSIVE METABOLIC PANEL WITH GFR
ALT: 27 U/L (ref 0–44)
AST: 26 U/L (ref 15–41)
Albumin: 2.7 g/dL — ABNORMAL LOW (ref 3.5–5.0)
Alkaline Phosphatase: 47 U/L (ref 38–126)
Anion gap: 9 (ref 5–15)
BUN: 9 mg/dL (ref 8–23)
CO2: 24 mmol/L (ref 22–32)
Calcium: 8.2 mg/dL — ABNORMAL LOW (ref 8.9–10.3)
Chloride: 98 mmol/L (ref 98–111)
Creatinine, Ser: 0.76 mg/dL (ref 0.61–1.24)
GFR, Estimated: >60 mL/min (ref >60)
Glucose, Bld: 118 mg/dL — ABNORMAL HIGH (ref 70–99)
Potassium: 4.4 mmol/L (ref 3.5–5.1)
Sodium: 131 mmol/L — ABNORMAL LOW (ref 135–145)
Total Bilirubin: 1.1 mg/dL (ref 0.0–1.2)
Total Protein: 5.4 g/dL — ABNORMAL LOW (ref 6.5–8.1)

## 2024-03-27 LAB — CBC
HCT: 35.1 % — ABNORMAL LOW (ref 39.0–52.0)
Hemoglobin: 12.1 g/dL — ABNORMAL LOW (ref 13.0–17.0)
MCH: 31.1 pg (ref 26.0–34.0)
MCHC: 34.5 g/dL (ref 30.0–36.0)
MCV: 90.2 fL (ref 80.0–100.0)
Platelets: 123 K/uL — ABNORMAL LOW (ref 150–400)
RBC: 3.89 MIL/uL — ABNORMAL LOW (ref 4.22–5.81)
RDW: 13.2 % (ref 11.5–15.5)
WBC: 9.5 K/uL (ref 4.0–10.5)
nRBC: 0 % (ref 0.0–0.2)

## 2024-03-27 LAB — HEPARIN LEVEL (UNFRACTIONATED): Heparin Unfractionated: 0.34 [IU]/mL (ref 0.30–0.70)

## 2024-03-27 LAB — LIPID PANEL
Cholesterol: 92 mg/dL (ref 0–200)
HDL: 44 mg/dL (ref >40)
LDL Cholesterol: 42 mg/dL (ref 0–99)
Total CHOL/HDL Ratio: 2.1 ratio
Triglycerides: 32 mg/dL (ref <150)
VLDL: 6 mg/dL (ref 0–40)

## 2024-03-27 NOTE — Progress Notes (Signed)
 ANTICOAGULATION CONSULT NOTE  Pharmacy Consult for Heparin  Indication: limb ischemia  Allergies  Allergen Reactions   Ace Inhibitors Anaphylaxis   Rythmol [Propafenone] Shortness Of Breath   Nitrofurantoin Rash    Patient Measurements: Height: 6' 1 (185.4 cm) Weight: 70.9 kg (156 lb 4.9 oz) IBW/kg (Calculated) : 79.9 Heparin  Dosing Weight: 70.9 kg  Vital Signs: Temp: 98.5 F (36.9 C) (10/29 0400) Temp Source: Oral (10/29 0400) BP: 102/60 (10/29 0500) Pulse Rate: 72 (10/29 0500)  Labs: Recent Labs    03/25/24 1942 03/25/24 2001 03/26/24 0337 03/26/24 0640 03/26/24 1500 03/27/24 0545  HGB 15.0 15.6 12.6* 13.2  --  12.1*  HCT 44.2 46.0 37.0* 38.4*  --  35.1*  PLT 157  --   --  124*  --  123*  HEPARINUNFRC  --   --   --   --  0.32 0.34  CREATININE  --  0.90  --  0.69  --  0.76    Estimated Creatinine Clearance: 78.8 mL/min (by C-G formula based on SCr of 0.76 mg/dL).   Medical History: Past Medical History:  Diagnosis Date   Aortic stenosis    Moderate AS with mean AVG 10.8, V-max 2.81m/s, SVI 28, DI 0.3   Carotid artery stenosis    1-39% bilaterally by dopplers 11/2022   Dyslipidemia    Hypertension    Inguinal hernia 2010   Small asymptomatic L inguinal Hernia   Permanent atrial fibrillation (HCC)    atrial fib    Pneumonia    hx of    Prostate cancer (HCC) 07/2012   s/p prostatectomy   PSA elevation    workup per Dr. Alm Fragmin, March 2014   Sigmoid diverticulosis    on colonoscopy in 2010   Skin cancer 02/2012   Dr Renay Homer   Tobacco dependence    rarely smokes-3 cigs per week    Assessment: Kyle Kelley with a history of prostate cancer, AF previously on eliquis  but this had been held since recent (September) hospitalization with ICH. Neurology outpatient notes planning for apixaban  resume 10/28. Patient is presenting with c/f limb ischemia. Heparin  per pharmacy consult placed for limb ischemia.  S/p thrombectomy. Heparin  level 0.34 is at low end  of therapeutic on 1250 units/hr. Hgb stable.   Goal of Therapy:  Heparin  level 0.3-0.7 units/ml Monitor platelets by anticoagulation protocol: Yes   Plan:  Increase heparin  to 1300 units/hr Check heparin  level daily while on heparin  Continue to monitor H&H and platelets F/u with PO AC  Nikky Duba, PharmD, BCPS, BCCP Clinical Pharmacist  Please check AMION for all [facility] Pharmacy phone numbers After 10:00 PM, call Main Pharmacy [phone]

## 2024-03-27 NOTE — Discharge Instructions (Signed)
 [facility]  Discharge instructions  Lower Extremity Bypass Surgery  Please refer to the following instruction for your post-procedure care. Your surgeon or physician assistant will discuss any changes with you.  Activity  You are encouraged to walk as much as you can. You can slowly return to normal activities during the month after your surgery. Avoid strenuous activity and heavy lifting until your doctor tells you it's OK. Avoid activities such as vacuuming or swinging a golf club. Do not drive until your doctor give the OK and you are no longer taking prescription pain medications. It is also normal to have difficulty with sleep habits, eating and bowel movement after surgery. These will go away with time.  Bathing/Showering  Shower daily after you go home. Do not soak in a bathtub, hot tub, or swim until the incision heals completely.  Incision Care  Clean your incision with mild soap and water. Shower every day. Pat the area dry with a clean towel. You do not need a bandage unless otherwise instructed. Do not apply any ointments or creams to your incision. If you have open wounds you will be instructed how to care for them or a visiting nurse may be arranged for you. If you have staples or sutures along your incision they will be removed at your post-op appointment. You may have skin glue on your incision. Do not peel it off. It will come off on its own in about one week.  Wash the groin wound with soap and water daily and pat dry. (No tub bath-only shower)  Then put a dry gauze or washcloth in the groin to keep this area dry to help prevent wound infection.  Do this daily and as needed.  Do not use Vaseline or neosporin on your incisions.  Only use soap and water on your incisions and then protect and keep dry.  Diet  Resume your normal diet. There are no special food restrictions following this procedure. A low fat/ low cholesterol diet is  recommended for all patients with vascular disease. In order to heal from your surgery, it is CRITICAL to get adequate nutrition. Your body requires vitamins, minerals, and protein. Vegetables are the best source of vitamins and minerals. Vegetables also provide the perfect balance of protein. Processed food has little nutritional value, so try to avoid this.  Medications  Resume taking all your medications unless your doctor or physician assistant tells you not to. If your incision is causing pain, you may take over-the-counter pain relievers such as acetaminophen  (Tylenol ). If you were prescribed a stronger pain medication, please aware these medication can cause nausea and constipation. Prevent nausea by taking the medication with a snack or meal. Avoid constipation by drinking plenty of fluids and eating foods with high amount of fiber, such as fruits, vegetables, and grains. Take Colace 100 mg (an over-the-counter stool softener) twice a day as needed for constipation.  Do not take Tylenol  if you are taking prescription pain medications.  Follow Up  Our office will schedule a follow up appointment 2-3 weeks following discharge.  Please call us  immediately for any of the following conditions  Severe or worsening pain in your legs or feet while at rest or while walking Increase pain, redness, warmth, or drainage (pus) from your incision site(s) Fever of 101 degree or higher The swelling in your leg with the bypass suddenly worsens and becomes more painful than when you were in the hospital If you have  been instructed to feel your graft pulse then you should do so every day. If you can no longer feel this pulse, call the office immediately. Not all patients are given this instruction.  Leg swelling is common after leg bypass surgery.  The swelling should improve over a few months following surgery. To improve the swelling, you may elevate your legs above the level of your heart while you are  sitting or resting. Your surgeon or physician assistant may ask you to apply an ACE wrap or wear compression (TED) stockings to help to reduce swelling.  Reduce your risk of vascular disease  Stop smoking. If you would like help call [facility] at 1-800-QUIT-NOW (609-485-9870) or [facility] at 417-855-7064.  Manage your cholesterol Maintain a desired weight Control your diabetes weight Control your diabetes Keep your blood pressure down  If you have any questions, please call the office at [phone]

## 2024-03-27 NOTE — Progress Notes (Addendum)
  Progress Note    03/27/2024 6:50 AM 2 Days Post-Op  Subjective:  no complaints.  Says he walked with PT yesterday a short distance.   Afebrile HR 70's-80's afib 90's-120's systolic 96% RA  Vitals:   03/27/24 0400 03/27/24 0500  BP: 104/60 102/60  Pulse: 74 72  Resp: 16 15  Temp: 98.5 F (36.9 C)   SpO2: 96% 95%    Physical Exam: General:  no distress Cardiac:  irregular Lungs:  non labored Incisions:  left groin and left below knee incisions are clean with staples in tact Extremities:  brisk doppler flow left PT and DP; left calf and anterior compartments remain soft and non tender   CBC    Component Value Date/Time   WBC 9.5 03/27/2024 0545   RBC 3.89 (L) 03/27/2024 0545   HGB 12.1 (L) 03/27/2024 0545   HCT 35.1 (L) 03/27/2024 0545   PLT 123 (L) 03/27/2024 0545   MCV 90.2 03/27/2024 0545   MCH 31.1 03/27/2024 0545   MCHC 34.5 03/27/2024 0545   RDW 13.2 03/27/2024 0545   LYMPHSABS 1.0 03/26/2024 0640   MONOABS 1.0 03/26/2024 0640   EOSABS 0.0 03/26/2024 0640   BASOSABS 0.0 03/26/2024 0640    BMET    Component Value Date/Time   NA 133 (L) 03/26/2024 0640   K 4.3 03/26/2024 0640   CL 100 03/26/2024 0640   CO2 25 03/26/2024 0640   GLUCOSE 104 (H) 03/26/2024 0640   BUN 9 03/26/2024 0640   CREATININE 0.69 03/26/2024 0640   CALCIUM  8.1 (L) 03/26/2024 0640   GFRNONAA >60 03/26/2024 0640   GFRAA >90 11/01/2012 0522    INR    Component Value Date/Time   INR 1.2 02/27/2024 2154     Intake/Output Summary (Last 24 hours) at 03/27/2024 0650 Last data filed at 03/27/2024 0600 Gross per 24 hour  Intake 470 ml  Output 1050 ml  Net -580 ml      Assessment/Plan:  78 y.o. male is s/p:   Left tibioperoneal trunk endarterectomy with vein patch angioplasty and left SFA stent on 03/26/2024 by Dr. Sheree for acute LLE CLI  2 Days Post-Op   -pt with brisk doppler flow left DP/PT.  Incisions look good.  -no evidence of compartment syndrome.  Motor and  sensory are in tact with left foot.  -no thrombus noted on echo. -DVT prophylaxis:  heparin  gtt.  Will need to convert back to Eliquis  prior to discharge.    Lucie Apt, PA-C [facility] [phone] 03/27/2024 6:50 AM  I have independently interviewed and examined patient and agree with PA assessment and plan above.   Gordan Grell C. Sheree, MD [facility] [place] Office: [phone] Pager: [phone]

## 2024-03-27 NOTE — Progress Notes (Signed)
 Physical Therapy Treatment Patient Details Name: Kyle Kelley MRN: [id number] DOB: 05-30-1946 Today's Date: 03/27/2024   History of Present Illness Pt is 78 y.o. male admitted 10/27 with critical limb ischemia of the left lower extremity s/p thromboembolectomy and left tibial peroneal endarterectomy patch angioplasty and stent placement of SFA. PMH: prostate cancer, A-fib, Recent admission 02/28/2024 for intracerebral hemorrhage.    PT Comments  Pt eager to participate w/ therapy.  Pain LLE decreased from 8/10 to 6/10 w/ activity, states only soreness.  PT had to remove Purewick which was leaking.  Pt amb w/ mesh pants and pad to BR.  Pt transfers sit to stand w/ CGA and cues for sequencing.  Pt amb to BR where continent of bladder, charted in Flowsheets, NT aware.  Pt amb x 2 of ~110' including turn to return to room for seated rest break.  Pt gait cycle improves w/ distance initially w/ ER and decreased knee flexion.  Pt returned to recliner and handed off to NT.   If plan is discharge home, recommend the following: A little help with walking and/or transfers;A little help with bathing/dressing/bathroom;Assistance with cooking/housework;Assist for transportation;Help with stairs or ramp for entrance   Can travel by private vehicle        Equipment Recommendations       Recommendations for Other Services       Precautions / Restrictions Precautions Precautions: Fall;Other (comment) (staples LLE) Restrictions Weight Bearing Restrictions Per Provider Order: No     Mobility  Bed Mobility    Pt presents sitting in recliner.                Transfers Overall transfer level: Needs assistance Equipment used: Rolling walker (2 wheels) Transfers: Sit to/from Stand Sit to Stand: Contact guard assist           General transfer comment: cues for forward sccot.    Ambulation/Gait Ambulation/Gait assistance: Contact guard assist Gait Distance (Feet): 110 Feet Assistive  device: Rolling walker (2 wheels) Gait Pattern/deviations: Step-through pattern, Decreased step length - right       General Gait Details: Cues for upright posture and RW use including proximity and sequencing to improve gait symmetry,improved gait pattern w/ distance.     Modified Rankin (Stroke Patients Only)       Balance Overall balance assessment: Needs assistance         Standing balance support: Bilateral upper extremity supported, During functional activity, Reliant on assistive device for balance Standing balance-Leahy Scale: Fair Standing balance comment: pt using RW only to push, cues for positioning.                            Communication Communication Communication: Impaired Factors Affecting Communication: Hearing impaired  Cognition Arousal: Alert     PT - Cognitive impairments: No apparent impairments                                Cueing    Exercises      General Comments General comments (skin integrity, edema, etc.): VSS on RA      Pertinent Vitals/Pain Pain Assessment Pain Assessment: 0-10 Pain Score: 6  Pain Location: LLE Pain Descriptors / Indicators: Tightness, Sore Pain Intervention(s): Monitored during session    Home Living Family/patient expects to be discharged to:: Private residence Living Arrangements: Spouse/significant other Available Help at Discharge: Family;Available 24 hours/day Type  of Home: House Home Access: Stairs to enter Entrance Stairs-Rails: Lawyer of Steps: 3   Home Layout: One level Home Equipment: Agricultural Consultant (2 wheels);Cane - single point;Grab bars - tub/shower      Prior Function            PT Goals (current goals can now be found in the care plan section) Acute Rehab PT Goals Patient Stated Goal: Get well return home Time For Goal Achievement: 04/10/24 Potential to Achieve Goals: Good Progress towards PT goals: Progressing toward goals     Frequency           PT Plan      Co-evaluation              AM-PAC PT 6 Clicks Mobility   Outcome Measure  Help needed turning from your back to your side while in a flat bed without using bedrails?: None Help needed moving from lying on your back to sitting on the side of a flat bed without using bedrails?: A Little Help needed moving to and from a bed to a chair (including a wheelchair)?: A Little Help needed standing up from a chair using your arms (e.g., wheelchair or bedside chair)?: A Little Help needed to walk in hospital room?: A Little Help needed climbing 3-5 steps with a railing? : A Little 6 Click Score: 19    End of Session Equipment Utilized During Treatment: Gait belt Activity Tolerance: Patient tolerated treatment well Patient left: in chair;with nursing/sitter in room Nurse Communication: Mobility status PT Visit Diagnosis: Unsteadiness on feet (R26.81);Other abnormalities of gait and mobility (R26.89);Muscle weakness (generalized) (M62.81);Difficulty in walking, not elsewhere classified (R26.2);Pain     Time: 0933-1010 PT Time Calculation (min) (ACUTE ONLY): 37 min  Charges:    $Gait Training: 8-22 mins $Therapeutic Activity: 8-22 mins PT General Charges $$ ACUTE PT VISIT: 1 Visit                     Reyes MYRTIS Sierra, PT   Reyes SHAUNNA Sierra 03/27/2024, 10:28 AM

## 2024-03-27 NOTE — Evaluation (Signed)
 Occupational Therapy Evaluation Patient Details Name: Kyle Kelley MRN: [id number] DOB: 1946-05-18 Today's Date: 03/27/2024   History of Present Illness   Pt is 78 y.o. male admitted 10/27 with critical limb ischemia of the left lower extremity s/p thromboembolectomy and left tibial peroneal endarterectomy patch angioplasty and stent placement of SFA. PMH: prostate cancer, A-fib, Recent admission 02/28/2024 for intracerebral hemorrhage.     Clinical Impressions Pt admitted based on above, and was seen based on problem list below. PTA pt was independent with ADLs and IADLs. Today pt is requiring set up  to min  assist for ADLs. STS was initially min assist to steady, progressed to CGA with use of RW for in room mobility. Pt limited by decreased balance, and stiffness in LLE. At this time, pt denying any need for follow up OT. Recommending shower seat at d/c to promote safety with showers and LB bathing. OT will continue to follow acutely to maximize functional independence.    If plan is discharge home, recommend the following:   A little help with walking and/or transfers;A little help with bathing/dressing/bathroom     Functional Status Assessment   Patient has had a recent decline in their functional status and demonstrates the ability to make significant improvements in function in a reasonable and predictable amount of time.     Equipment Recommendations   Tub/shower seat      Precautions/Restrictions   Precautions Precautions: Fall Recall of Precautions/Restrictions: Intact Restrictions Weight Bearing Restrictions Per Provider Order: No     Mobility Bed Mobility Overal bed mobility: Modified Independent       General bed mobility comments: HOB elevated    Transfers Overall transfer level: Needs assistance Equipment used: Rolling walker (2 wheels) Transfers: Sit to/from Stand Sit to Stand: Min assist     General transfer comment: Initally min assist  for STS d/t posterior lean. Once steadied out, progressed to CGA with RW      Balance Overall balance assessment: Needs assistance Sitting-balance support: No upper extremity supported, Feet supported Sitting balance-Leahy Scale: Good     Standing balance support: Bilateral upper extremity supported, During functional activity, Reliant on assistive device for balance Standing balance-Leahy Scale: Poor Standing balance comment: Min to CGA. Heavy reliance on RW       ADL either performed or assessed with clinical judgement   ADL Overall ADL's : Needs assistance/impaired Eating/Feeding: Set up;Sitting   Grooming: Wash/dry face;Oral care;Contact guard assist;Standing           Upper Body Dressing : Set up;Sitting   Lower Body Dressing: Minimal assistance;Sit to/from stand   Toilet Transfer: Contact guard assist;Rolling walker (2 wheels);Ambulation;Regular Toilet;Grab bars   Toileting- Clothing Manipulation and Hygiene: Supervision/safety;Sitting/lateral lean       Functional mobility during ADLs: Minimal assistance;Contact guard assist;Rolling walker (2 wheels) General ADL Comments: Limited by pain and stiffness     Vision Baseline Vision/History: 0 No visual deficits Vision Assessment?: No apparent visual deficits            Pertinent Vitals/Pain Pain Assessment Pain Assessment: Faces Faces Pain Scale: Hurts little more Pain Location: LLE Pain Descriptors / Indicators: Aching Pain Intervention(s): Monitored during session     Extremity/Trunk Assessment Upper Extremity Assessment Upper Extremity Assessment: Overall WFL for tasks assessed   Lower Extremity Assessment Lower Extremity Assessment: Defer to PT evaluation   Cervical / Trunk Assessment Cervical / Trunk Assessment: Normal   Communication Communication Communication: Impaired Factors Affecting Communication: Hearing impaired   Cognition  Arousal: Alert Behavior During Therapy: WFL for tasks  assessed/performed Cognition: No apparent impairments       Following commands: Intact       Cueing  General Comments   Cueing Techniques: Verbal cues  VSS on RA           Home Living Family/patient expects to be discharged to:: Private residence Living Arrangements: Spouse/significant other Available Help at Discharge: Family;Available 24 hours/day Type of Home: House Home Access: Stairs to enter [employer] of Steps: 3 Entrance Stairs-Rails: Left;Right Home Layout: One level     Bathroom Shower/Tub: Chief Strategy Officer: Standard Bathroom Accessibility: No   Home Equipment: Agricultural Consultant (2 wheels);Cane - single point;Grab bars - tub/shower          Prior Functioning/Environment Prior Level of Function : Independent/Modified Independent     Mobility Comments: Ind, completing OPPT, reports has graduated from [employer] and using no AD ADLs Comments: Ind    OT Problem List: Decreased strength;Decreased range of motion;Impaired balance (sitting and/or standing);Decreased activity tolerance;Cardiopulmonary status limiting activity   OT Treatment/Interventions: Therapeutic exercise;Self-care/ADL training;Energy conservation;DME and/or AE instruction;Therapeutic activities;Patient/family education;Balance training      OT Goals(Current goals can be found in the care plan section)   Acute Rehab OT Goals Patient Stated Goal: To go home OT Goal Formulation: With patient Time For Goal Achievement: 04/10/24 Potential to Achieve Goals: Good   OT Frequency:  Min 2X/week       AM-PAC OT 6 Clicks Daily Activity     Outcome Measure Help from another person eating meals?: None Help from another person taking care of personal grooming?: A Little Help from another person toileting, which includes using toliet, bedpan, or urinal?: A Little Help from another person bathing (including washing, rinsing, drying)?: A Little Help from another person  to put on and taking off regular upper body clothing?: A Little Help from another person to put on and taking off regular lower body clothing?: A Little 6 Click Score: 19   End of Session Equipment Utilized During Treatment: Gait belt;Rolling walker (2 wheels) Nurse Communication: Mobility status  Activity Tolerance: Patient tolerated treatment well Patient left: in chair;with call bell/phone within reach;with nursing/sitter in room  OT Visit Diagnosis: Other abnormalities of gait and mobility (R26.89);Unsteadiness on feet (R26.81);Muscle weakness (generalized) (M62.81)                Time: 9175-9145 OT Time Calculation (min): 30 min Charges:  OT General Charges $OT Visit: 1 Visit OT Evaluation $OT Eval Moderate Complexity: 1 Mod OT Treatments $Self Care/Home Management : 8-22 mins  Adrianne BROCKS, OT  Acute Rehabilitation Services Office [phone] Secure chat preferred   Adrianne GORMAN Savers 03/27/2024, 10:14 AM

## 2024-03-27 NOTE — Progress Notes (Signed)
 PROGRESS NOTE Kyle Kelley  FMW:[id number] DOB: [birth year] DOA: 03/25/2024 PCP: Charlott Dorn LABOR, MD  Brief Narrative/Hospital Course: Kyle Kelley is a 78 y.o. male with PMH of prostate cancer s/p resection in 2014 and radiation for recurrence on 2018, A-fib on Eliquis  was admitted on February 28, 2024 for intracerebral hemorrhage at that time patient's Eliquis  was held and had followed up with neurologist  10/27 when neurologist planned to start Eliquis , followed up with PT OT when patient started experiencing left lower extremity pain and numbness and was referred to the ER.   In the ERL: CT angiogram of the lower extremity showed left lower extremity arterial occlusive disease with thrombosis of the common femoral artery and popliteal artery.  Dr. Sheree vascular surgeon was consulted and patient was taken to the OR and underwent left lower extremity thromboembolectomy and left tibial peroneal trunk endarterectomy with vein patch angioplasty ,stent of left SFA. initial labs drawn in the ER did show hyponatremia of sodium 127 and potassium 5.7.   Subjective: Seen and examined Resting comfortably on the bedside chair reports he just saw his vascular doctor this morning Overnight afebrile BP stable BMP pending, CBC with mild anemia and thrombocytopenia  Assessment and plan:  Critical limb ischemia of the  LLE: S/P thromboembolectomy and left tibial peroneal endarterectomy patch angioplasty and stent placement of SFA 10/27. Vascular surgery following closely, patient now on Plavix, Crestor . Monitor distal pulses. Remains on heparin  infusion-continue to monitor motor, sensory and vascular status on the extremities Plan is to convert to Eliquis  prior to discharge  A-fib:' rate controlled on Cardizem  and metoprolol .  Hypertension: Controlled.  Continue Cardizem  and metoprolol .  Hyperlipidemia Continue Crestor   History of recent intracranial hemorrhage was admitted on February 28, 2024. Neurologist had cleared him for restarting anticoagulation on 10/27. Continue Plavix  Prior history of prostate cancer  DVT prophylaxis: SCD's Start: 03/26/24 0835Heparin infusion Code Status:   Code Status: Full Code Family Communication: plan of care discussed with patient/3wife at bedside. Patient status is: Remains hospitalized because of severity of illness Level of care: Progressive   Dispo: The patient is from: home            Anticipated disposition: TBD Objective: Vitals last 24 hrs: Vitals:   03/27/24 0200 03/27/24 0300 03/27/24 0400 03/27/24 0500  BP: 110/60 103/60 104/60 102/60  Pulse: 73 75 74 72  Resp: 18 16 16 15   Temp:   98.5 F (36.9 C)   TempSrc:   Oral   SpO2: 94% 94% 96% 95%  Weight:      Height:        Physical Examination: General exam: alert awake, oriented HEENT:Oral mucosa moist, Ear/Nose WNL grossly Respiratory system: Bilaterally clear BS,no use of accessory muscle Cardiovascular system: S1 & S2 +, No JVD. Gastrointestinal system: Abdomen soft,NT,ND, BS+ Nervous System: Alert, awake, moving all extremities,and following commands. Extremities: extremities warm, leg edema neg, left lower extremity dressing has been removed, sutures in place mild oozing clean no foul smeel Skin: Warm, no rashes MSK: Normal muscle bulk,tone, power   Medications reviewed:  Scheduled Meds:  sodium chloride    Intravenous Once   clopidogrel  75 mg Oral Daily   diltiazem   300 mg Oral Daily   docusate sodium   100 mg Oral Daily   metoprolol  succinate  25 mg Oral Daily   rosuvastatin   20 mg Oral Daily   Continuous Infusions:  sodium chloride      heparin  1,250 Units/hr (03/27/24 0411)  Diet: Diet Order             Diet Heart Room service appropriate? Yes; Fluid consistency: Thin  Diet effective now                   Data Reviewed: I have personally reviewed following labs and imaging studies ( see epic result tab) CBC: Recent Labs  Lab  03/25/24 1942 03/25/24 2001 03/26/24 0337 03/26/24 0640 03/27/24 0545  WBC 10.8*  --   --  5.1 9.5  NEUTROABS 8.6*  --   --  3.1  --   HGB 15.0 15.6 12.6* 13.2 12.1*  HCT 44.2 46.0 37.0* 38.4* 35.1*  MCV 90.9  --   --  89.9 90.2  PLT 157  --   --  124* 123*   CMP: Recent Labs  Lab 03/25/24 2001 03/26/24 0337 03/26/24 0640 03/27/24 0545  NA 127* 135 133* 131*  K 5.7* 3.9 4.3 4.4  CL 94*  --  100 98  CO2  --   --  25 24  GLUCOSE 105*  --  104* 118*  BUN 28*  --  9 9  CREATININE 0.90  --  0.69 0.76  CALCIUM   --   --  8.1* 8.2*   GFR: Estimated Creatinine Clearance: 78.8 mL/min (by C-G formula based on SCr of 0.76 mg/dL). Recent Labs  Lab 03/26/24 0640 03/27/24 0545  AST 33 26  ALT 40 27  ALKPHOS 51 47  BILITOT 0.6 1.1  PROT 5.6* 5.4*  ALBUMIN 2.9* 2.7*   No results for input(s): LIPASE, AMYLASE in the last 168 hours. No results for input(s): AMMONIA in the last 168 hours. Coagulation Profile: No results for input(s): INR, PROTIME in the last 168 hours. Unresulted Labs (From admission, onward)     Start     Ordered   03/27/24 0500  Heparin  level (unfractionated)  Daily,   R      03/25/24 2031   03/27/24 0500  Basic metabolic panel  Daily,   R      03/26/24 0835   03/26/24 0500  CBC  Daily,   R      03/25/24 2200           Antimicrobials/Microbiology: Anti-infectives (From admission, onward)    Start     Dose/Rate Route Frequency Ordered Stop   03/26/24 1000  ceFAZolin  (ANCEF ) IVPB 2g/100 mL premix        2 g 200 mL/hr over 30 Minutes Intravenous Every 8 hours 03/26/24 0835 03/26/24 1909      No results found for: SDES, SPECREQUEST, CULT, REPTSTATUS  Procedures: Procedure(s) (LRB): THROMBECTOMY, LEFT FEMORAL TO BELOW KNEE ARTERY (Left) ANGIOGRAM, LOWER EXTREMITY (Left) SURGICAL PROCUREMENT, LEFT LOWER SAPHENOUS VEIN (Left) INSERTION, STENT, VASCULAR, LEFT SUPERFICAL FEMORAL ARTERY (Left) ENDARTERECTOMY, LEFT TP TRUNK WITH PATCH  ANGIOPLASTY   Kyle LAMY, MD [facility] 03/27/2024, 10:58 AM

## 2024-03-28 ENCOUNTER — Other Ambulatory Visit (HOSPITAL_COMMUNITY): Payer: Self-pay

## 2024-03-28 ENCOUNTER — Ambulatory Visit

## 2024-03-28 DIAGNOSIS — I70222 Atherosclerosis of native arteries of extremities with rest pain, left leg: Secondary | ICD-10-CM | POA: Diagnosis not present

## 2024-03-28 LAB — BASIC METABOLIC PANEL WITH GFR
Anion gap: 10 (ref 5–15)
BUN: 14 mg/dL (ref 8–23)
CO2: 24 mmol/L (ref 22–32)
Calcium: 8.3 mg/dL — ABNORMAL LOW (ref 8.9–10.3)
Chloride: 97 mmol/L — ABNORMAL LOW (ref 98–111)
Creatinine, Ser: 0.77 mg/dL (ref 0.61–1.24)
GFR, Estimated: >60 mL/min (ref >60)
Glucose, Bld: 114 mg/dL — ABNORMAL HIGH (ref 70–99)
Potassium: 4.1 mmol/L (ref 3.5–5.1)
Sodium: 131 mmol/L — ABNORMAL LOW (ref 135–145)

## 2024-03-28 LAB — CBC
HCT: 32.9 % — ABNORMAL LOW (ref 39.0–52.0)
Hemoglobin: 11.3 g/dL — ABNORMAL LOW (ref 13.0–17.0)
MCH: 31 pg (ref 26.0–34.0)
MCHC: 34.3 g/dL (ref 30.0–36.0)
MCV: 90.4 fL (ref 80.0–100.0)
Platelets: 135 K/uL — ABNORMAL LOW (ref 150–400)
RBC: 3.64 MIL/uL — ABNORMAL LOW (ref 4.22–5.81)
RDW: 13.3 % (ref 11.5–15.5)
WBC: 10.5 K/uL (ref 4.0–10.5)
nRBC: 0 % (ref 0.0–0.2)

## 2024-03-28 LAB — HEPARIN LEVEL (UNFRACTIONATED): Heparin Unfractionated: 0.19 [IU]/mL — ABNORMAL LOW (ref 0.30–0.70)

## 2024-03-28 MED ORDER — CLOPIDOGREL BISULFATE 75 MG PO TABS
75.0000 mg | ORAL_TABLET | Freq: Every day | ORAL | 0 refills | Status: AC
  Filled 2024-03-28: qty 30, 30d supply, fill #0

## 2024-03-28 MED ORDER — APIXABAN 5 MG PO TABS
5.0000 mg | ORAL_TABLET | Freq: Two times a day (BID) | ORAL | Status: DC
  Administered 2024-03-28: 5 mg via ORAL
  Filled 2024-03-28: qty 2

## 2024-03-28 MED ORDER — POLYETHYLENE GLYCOL 3350 17 GM/SCOOP PO POWD
17.0000 g | Freq: Every day | ORAL | 0 refills | Status: DC | PRN
  Filled 2024-03-28: qty 238, 14d supply, fill #0

## 2024-03-28 NOTE — Progress Notes (Signed)
 ANTICOAGULATION CONSULT NOTE  Pharmacy Consult for Heparin  Indication: limb ischemia  Allergies  Allergen Reactions   Ace Inhibitors Anaphylaxis   Rythmol [Propafenone] Shortness Of Breath   Nitrofurantoin Rash    Patient Measurements: Height: 6' 1 (185.4 cm) Weight: 70.9 kg (156 lb 4.9 oz) IBW/kg (Calculated) : 79.9 Heparin  Dosing Weight: 70.9 kg  Vital Signs: Temp: 97.6 F (36.4 C) (10/30 0754) Temp Source: Oral (10/30 0754) BP: 113/62 (10/30 0754) Pulse Rate: 80 (10/30 0754)  Labs: Recent Labs    03/26/24 0640 03/26/24 1500 03/27/24 0545 03/28/24 0324  HGB 13.2  --  12.1* 11.3*  HCT 38.4*  --  35.1* 32.9*  PLT 124*  --  123* 135*  HEPARINUNFRC  --  0.32 0.34 0.19*  CREATININE 0.69  --  0.76 0.77    Estimated Creatinine Clearance: 78.8 mL/min (by C-G formula based on SCr of 0.77 mg/dL).   Medical History: Past Medical History:  Diagnosis Date   Aortic stenosis    Moderate AS with mean AVG 10.8, V-max 2.85m/s, SVI 28, DI 0.3   Carotid artery stenosis    1-39% bilaterally by dopplers 11/2022   Dyslipidemia    Hypertension    Inguinal hernia 2010   Small asymptomatic L inguinal Hernia   Permanent atrial fibrillation (HCC)    atrial fib    Pneumonia    hx of    Prostate cancer (HCC) 07/2012   s/p prostatectomy   PSA elevation    workup per Dr. Alm Fragmin, March 2014   Sigmoid diverticulosis    on colonoscopy in 2010   Skin cancer 02/2012   Dr Renay Homer   Tobacco dependence    rarely smokes-3 cigs per week    Assessment: 79 yom with a history of prostate cancer, AF previously on eliquis  but this had been held since recent (September) hospitalization with ICH. Neurology outpatient notes planning for apixaban  resume 10/28. Patient is presenting with c/f limb ischemia. Heparin  per pharmacy consult placed for limb ischemia.  S/p thrombectomy. Heparin  level is low.   Goal of Therapy:  Heparin  level 0.3-0.7 units/ml Monitor platelets by  anticoagulation protocol: Yes   Plan:  Increase heparin  to 1450 units/hr Check heparin  level daily while on heparin  Continue to monitor H&H and platelets F/u with PO AC  Thank you. Olam Monte, PharmD  Please check AMION for all [facility] phone numbers After 10:00 PM, call Main Pharmacy [phone]

## 2024-03-28 NOTE — Progress Notes (Addendum)
  Progress Note    03/28/2024 6:37 AM 3 Days Post-Op  Subjective:  says he wants the purewick off.    afebrile  Vitals:   03/27/24 2317 03/28/24 0327  BP: 126/69 111/60  Pulse: 88 81  Resp: 18 17  Temp: 98 F (36.7 C) 98.2 F (36.8 C)  SpO2: 100%     Physical Exam: General:  no distress Cardiac:  regular Lungs:  non labored Incisions:  left groin and left below knee incisions look good with staples in tact.  Extremities:  palpable left PT pulse.  Left calf non tender to palpation and soft but swollen   CBC    Component Value Date/Time   WBC 10.5 03/28/2024 0324   RBC 3.64 (L) 03/28/2024 0324   HGB 11.3 (L) 03/28/2024 0324   HCT 32.9 (L) 03/28/2024 0324   PLT 135 (L) 03/28/2024 0324   MCV 90.4 03/28/2024 0324   MCH 31.0 03/28/2024 0324   MCHC 34.3 03/28/2024 0324   RDW 13.3 03/28/2024 0324   LYMPHSABS 1.0 03/26/2024 0640   MONOABS 1.0 03/26/2024 0640   EOSABS 0.0 03/26/2024 0640   BASOSABS 0.0 03/26/2024 0640    BMET    Component Value Date/Time   NA 131 (L) 03/28/2024 0324   K 4.1 03/28/2024 0324   CL 97 (L) 03/28/2024 0324   CO2 24 03/28/2024 0324   GLUCOSE 114 (H) 03/28/2024 0324   BUN 14 03/28/2024 0324   CREATININE 0.77 03/28/2024 0324   CALCIUM  8.3 (L) 03/28/2024 0324   GFRNONAA >60 03/28/2024 0324   GFRAA >90 11/01/2012 0522    INR    Component Value Date/Time   INR 1.2 02/27/2024 2154     Intake/Output Summary (Last 24 hours) at 03/28/2024 9362 Last data filed at 03/27/2024 2050 Gross per 24 hour  Intake 120 ml  Output --  Net 120 ml      Assessment/Plan:  78 y.o. male is s/p:  Left tibioperoneal trunk endarterectomy with vein patch angioplasty and left SFA stent on 03/26/2024 by Dr. Sheree for acute LLE CLI   3 Days Post-Op   -palpable left DP pulse.  Incisions look fine.  -I asked nursing staff to remove purewick.  -left calf is non tender to palpation  -DVT prophylaxis:  heparin  gtt-convert back to Eliquis  prior to  discharge   Lucie Apt, PA-C [facility] [phone] 03/28/2024 6:37 AM  I have independently interviewed and examined patient and agree with PA assessment and plan above. His leg is edematous but does not have evidence of compartment syndrome.  Okay for discharge from vascular standpoint.  Anginette Espejo C. Sheree, MD [facility] [place] Office: [phone] Pager: [phone]

## 2024-03-28 NOTE — Discharge Summary (Signed)
 Physician Discharge Summary  Kyle Kelley FMW:[id number] DOB: [birth year] DOA: 03/25/2024  PCP: Charlott Dorn LABOR, MD  Admit date: 03/25/2024 Discharge date: 03/28/2024 Recommendations for Outpatient Follow-up:  Follow up with PCP in 1 weeks-call for appointment Please obtain BMP/CBC in one week  Discharge Dispo: Home Discharge Condition: Stable Code Status:   Code Status: Full Code Diet recommendation:  Diet Order             Diet - low sodium heart healthy           Diet Heart Room service appropriate? Yes; Fluid consistency: Thin  Diet effective now                    Brief/Interim Summary: Kyle Kelley is a 78 y.o. male with PMH of prostate cancer s/p resection in 2014 and radiation for recurrence on 2018, A-fib on Eliquis  was admitted on February 28, 2024 for intracerebral hemorrhage at that time patient's Eliquis  was held and had followed up with neurologist  10/27 when neurologist planned to start Eliquis , followed up with PT OT when patient started experiencing left lower extremity pain and numbness and was referred to the ER.   In the ERL: CT angiogram of the lower extremity showed left lower extremity arterial occlusive disease with thrombosis of the common femoral artery and popliteal artery.  Dr. Sheree vascular surgeon was consulted and patient was taken to the OR and underwent left lower extremity thromboembolectomy and left tibial peroneal trunk endarterectomy with vein patch angioplasty ,stent of left SFA. initial labs drawn in the ER did show hyponatremia of sodium 127 and potassium 5.7.  Postop doing well, past surgery has cleared the patient for discharge and transition heparin  to Eliquis  upon discharge  Subjective: Seen and examined Overnight afebrile BP stable labs reviewed stable renal function CBC with mild anemia and thrombocytopenia hemoglobin 12.1> 11.3  Discharge Diagnoses:   Critical limb ischemia of the  LLE: S/P thromboembolectomy and left  tibial peroneal endarterectomy patch angioplasty and stent placement of SFA 10/27. Vascular surgery following closely, patient now on Plavix, Crestor . Monitor distal pulses. Postop doing well, past surgery has cleared the patient for discharge and transition heparin  to Eliquis  upon discharge He is also now on plavix.  A-fib: rate controlled on Cardizem  and metoprolol .  Hypertension: Controlled.  Continue Cardizem  and metoprolol .  Hyperlipidemia Continue Crestor   History of recent intracranial hemorrhage was admitted on February 28, 2024. Neurologist had cleared him for restarting anticoagulation on 10/27. Continue Plavix  Prior history of prostate cancer  DVT prophylaxis: SCD's Start: 03/26/24 0835Heparin infusion Code Status:   Code Status: Full Code Family Communication: plan of care discussed with patient/3wife at bedside. Patient status is: Remains hospitalized because of severity of illness Level of care: Progressive   Dispo: The patient is from: home            Anticipated disposition: home  Objective: Vitals last 24 hrs: Vitals:   03/27/24 1941 03/27/24 2317 03/28/24 0327 03/28/24 0754  BP: (!) 115/54 126/69 111/60 113/62  Pulse: 75 88 81 80  Resp: 18 18 17 18   Temp: 98.8 F (37.1 C) 98 F (36.7 C) 98.2 F (36.8 C) 97.6 F (36.4 C)  TempSrc: Oral Oral Oral Oral  SpO2: 98% 100%  100%  Weight:      Height:        Physical Examination: General exam: alert awake, oriented HEENT:Oral mucosa moist, Ear/Nose WNL grossly Respiratory system: Bilaterally clear BS,no use of accessory  muscle Cardiovascular system: S1 & S2 +, No JVD. Gastrointestinal system: Abdomen soft,NT,ND, BS+ Nervous System: Alert, awake, moving all extremities,and following commands. Extremities: extremities warm, leg edema neg, left lower extremity dressing has been removed, sutures in place mild oozing clean no foul smeel Skin: Warm, no rashes MSK: Normal muscle bulk,tone, power     Procedure(s) (LRB): THROMBECTOMY, LEFT FEMORAL TO BELOW KNEE ARTERY (Left) ANGIOGRAM, LOWER EXTREMITY (Left) SURGICAL PROCUREMENT, LEFT LOWER SAPHENOUS VEIN (Left) INSERTION, STENT, VASCULAR, LEFT SUPERFICAL FEMORAL ARTERY (Left) ENDARTERECTOMY, LEFT TP TRUNK WITH PATCH ANGIOPLASTY  Consultation: See note.  Discharge Instructions  Discharge Instructions     Diet - low sodium heart healthy   Complete by: As directed    Discharge instructions   Complete by: As directed    Please call call MD or return to ER for similar or worsening recurring problem that brought you to hospital or if any fever,nausea/vomiting,abdominal pain, uncontrolled pain, chest pain,  shortness of breath or any other alarming symptoms.  Please follow-up your doctor as instructed in a week time and call the office for appointment.  Please avoid alcohol, smoking, or any other illicit substance and maintain healthy habits including taking your regular medications as prescribed.  You were cared for by a hospitalist during your hospital stay. If you have any questions about your discharge medications or the care you received while you were in the hospital after you are discharged, you can call the unit and ask to speak with the hospitalist on call if the hospitalist that took care of you is not available.  Once you are discharged, your primary care physician will handle any further medical issues. Please note that NO REFILLS for any discharge medications will be authorized once you are discharged, as it is imperative that you return to your primary care physician (or establish a relationship with a primary care physician if you do not have one) for your aftercare needs so that they can reassess your need for medications and monitor your lab values   Discharge wound care:   Complete by: As directed    Wash with soap and water , see Dr Sheree in clinic for further care   Increase activity slowly   Complete by: As directed        Allergies as of 03/28/2024       Reactions   Ace Inhibitors Anaphylaxis   Rythmol [propafenone] Shortness Of Breath   Nitrofurantoin Rash        Medication List     TAKE these medications    apixaban  5 MG Tabs tablet Commonly known as: ELIQUIS  Take 5 mg by mouth 2 (two) times daily.   clopidogrel 75 MG tablet Commonly known as: PLAVIX Take 1 tablet (75 mg total) by mouth daily. Start taking on: March 29, 2024   diltiazem  300 MG 24 hr capsule Commonly known as: TIAZAC  Take 300 mg by mouth daily.   EPIPEN  IJ Inject 1 application  as directed as needed (anaphylaxis).   metoprolol  succinate 25 MG 24 hr tablet Commonly known as: TOPROL -XL Take 1 tablet (25 mg total) by mouth daily.   polyethylene glycol powder 17 GM/SCOOP powder Commonly known as: GLYCOLAX/MIRALAX Take 17 g by mouth daily as needed for mild constipation. Dissolve 1 capful (17g) in 4-8 ounces of liquid and take by mouth daily.   rosuvastatin  10 MG tablet Commonly known as: CRESTOR  Take 1 tablet (10 mg total) by mouth daily.  Discharge Care Instructions  (From admission, onward)           Start     Ordered   03/28/24 0000  Discharge wound care:       Comments: Wash with soap and water , see Dr Sheree in clinic for further care   03/28/24 1117            Follow-up Information     [facility] Follow up in 3 week(s).   Specialty: Vascular Surgery Why: Office will call you to arrange your appt (sent). Contact information: [address], [address] [place]  [address] [phone]        Charlott Dorn LABOR, MD Follow up in 1 week(s).   Specialty: Internal Medicine Contact information: [address] [place] [address] [phone]                Allergies  Allergen Reactions   Ace Inhibitors Anaphylaxis   Rythmol [Propafenone] Shortness Of Breath    Nitrofurantoin Rash    The results of significant diagnostics from this hospitalization (including imaging, microbiology, ancillary and laboratory) are listed below for reference.    Microbiology: No results found for this or any previous visit (from the past 240 hours).  Procedures/Studies: ECHOCARDIOGRAM COMPLETE Result Date: 03/26/2024    ECHOCARDIOGRAM REPORT   Patient Name:   BRYSE BLANCHETTE Date of Exam: 03/26/2024 Medical Rec #:  [id number]      Height:       73.0 in Accession #:    [id number]     Weight:       156.3 lb Date of Birth:  08/07/1945      BSA:          1.938 m Patient Age:    78 years       BP:           99/62 mmHg Patient Gender: M              HR:           75 bpm. Exam Location:  Inpatient Procedure: 2D Echo, Cardiac Doppler and Color Doppler (Both Spectral and Color            Flow Doppler were utilized during procedure). Indications:    Critical lower limb ischemia  History:        Patient has prior history of Echocardiogram examinations, most                 recent 12/26/2023. Stroke, Arrythmias:Atrial Fibrillation,                 Signs/Symptoms:Dizziness/Lightheadedness; Risk                 Factors:Hypertension, Dyslipidemia and Current Smoker.  Sonographer:    Juliene Rucks Referring Phys: [phone] LUCIE PARAS RHYNE  Sonographer Comments: Suboptimal parasternal window. IMPRESSIONS  1. Left ventricular ejection fraction, by estimation, is 55 to 60%. The left ventricle has normal function. The left ventricle has no regional wall motion abnormalities. Left ventricular diastolic parameters are indeterminate.  2. Right ventricular systolic function is mildly reduced. The right ventricular size is mildly enlarged.  3. The mitral valve is grossly normal. No evidence of mitral valve regurgitation.  4. The aortic valve was not well visualized. Aortic valve regurgitation is not visualized. Aortic valve sclerosis is present, with no evidence of aortic valve stenosis.  5. The  inferior vena cava is  normal in size with greater than 50% respiratory variability, suggesting right atrial pressure of 3 mmHg. Comparison(s): Prior images reviewed side by side. Tricuspid regurgitation has improved. Aortic stenosis interogation not well performed during this study. FINDINGS  Left Ventricle: Left ventricular ejection fraction, by estimation, is 55 to 60%. The left ventricle has normal function. The left ventricle has no regional wall motion abnormalities. The left ventricular internal cavity size was normal in size. There is  no left ventricular hypertrophy. Left ventricular diastolic parameters are indeterminate. Right Ventricle: The right ventricular size is mildly enlarged. No increase in right ventricular wall thickness. Right ventricular systolic function is mildly reduced. Left Atrium: Left atrial size was not well visualized. Right Atrium: Right atrial size was not well visualized. Pericardium: There is no evidence of pericardial effusion. Mitral Valve: The mitral valve is grossly normal. Mild mitral annular calcification. No evidence of mitral valve regurgitation. Tricuspid Valve: The tricuspid valve is normal in structure. Tricuspid valve regurgitation is trivial. No evidence of tricuspid stenosis. Aortic Valve: The aortic valve was not well visualized. Aortic valve regurgitation is not visualized. Aortic valve sclerosis is present, with no evidence of aortic valve stenosis. Pulmonic Valve: The pulmonic valve was not well visualized. Pulmonic valve regurgitation is not visualized. Aorta: The aortic root is normal in size and structure. Venous: The inferior vena cava is normal in size with greater than 50% respiratory variability, suggesting right atrial pressure of 3 mmHg. IAS/Shunts: The atrial septum is grossly normal.  IVC IVC diam: 1.60 cm MR Peak grad: 29.2 mmHg   TRICUSPID VALVE MR Vmax:      270.00 cm/s TR Peak grad:   28.1 mmHg                           TR Vmax:        265.00 cm/s Stanly Leavens MD  Electronically signed by Stanly Leavens MD Signature Date/Time: 03/26/2024/3:14:46 PM    Final    HYBRID OR IMAGING ([facility] ONLY) Result Date: 03/25/2024 There is no interpretation for this exam.  This order is for images obtained during a surgical procedure.  Please See Surgeries Tab for more information regarding the procedure.   CT ANGIO LOWER EXT BILAT W &/OR WO CONTRAST Result Date: 03/25/2024 EXAM: CTA BILATERAL LOWER EXTREMITY 03/25/2024 08:47:16 PM TECHNIQUE: Contrast-enhanced computed tomography angiography of the lower extremity was performed with multiplanar reconstructions. Maximum intensity projection images were created on a separate workstation and reviewed. Automated exposure control, iterative reconstruction, and/or weight based adjustment of the mA/kV was utilized to reduce the radiation dose to as low as reasonably achievable. COMPARISON: None available. CLINICAL HISTORY: FINDINGS: ARTERIAL: LEFT COMMON ILIAC ARTERY: The left common iliac artery demonstrates up to 50% irregular stenosis with patchy calcific plaques. There is up to a 75% origin stenosis in the internal iliac artery, moderate patchy irregular stenosis in the remainder. LEFT EXTERNAL ILIAC ARTERY: Left external iliac artery demonstrates patchy calcifications without flow-limiting stenosis. COMMON FEMORAL ARTERY: There is vessel thrombosis occluding the left common femoral artery and the origin of the left deep femoral artery. The left deep femoral artery reconstitutes just past the occluded origin with good flow in the remainder of the vessel. The left lateral circumflex femoral artery is patent. The left medial circumflex femoral artery is occluded in the proximal 1 cm of the vessel then reconstitutes. SUPERFICIAL FEMORAL ARTERY: There is thrombosis occluding the left superficial femoral artery origin  with vessel reconstitution into the proximal thigh then complete occlusion down to the Hunter's canal segment which  reconstitutes with 60% stenosis. There are patchy calcific plaques along its course. POPLITEAL ARTERY: There is patchy calcification in the left popliteal artery with thrombosis occluding the vessel. TIBIOPERONEAL TRUNK: No arterial enhancement can be detected in the tibioperoneal trunk. ANTERIOR TIBIAL ARTERY: No arterial enhancement can be detected in the anterior tibial artery. No flow identified in the dorsalis pedis artery. PERONEAL ARTERY: No arterial enhancement can be detected in the peroneal artery. POSTERIOR TIBIAL ARTERY: No arterial enhancement can be detected in the posterior tibial artery. RIGHT COMMON ILIAC ARTERY: On the right, there are patchy vessel wall calcifications in the common iliac, internal and external iliac arteries but no flow limiting stenoses. RIGHT EXTERNAL ILIAC ARTERY: no flow limiting stenoses. RIGHT COMMON FEMORAL ARTERY: The right common femoral artery is patent with mild calcification posteriorly. The right deep femoral artery is patent with mild patchy calcifications. RIGHT SUPERFICIAL FEMORAL ARTERY: The right superficial femoral artery is patent with mild patchy calcifications. RIGHT POPLITEAL ARTERY: Right popliteal artery is patent. RIGHT TIBIOPERONEAL TRUNK: There are calcifications in the tibioperoneal trunk and proximal right anterior tibial artery with unobstructed flow in the right anterior and posterior tibial arteries into the foot. RIGHT ANTERIOR TIBIAL ARTERY: Calcifications in the proximal right anterior tibial artery with unobstructed flow into the foot. RIGHT PERONEAL ARTERY: Right peroneal artery is occluded in the proximal 1 to 2 cm but reconstitutes in the upper calf with flow to the ankle. RIGHT POSTERIOR TIBIAL ARTERY: Unobstructed flow into the foot. BONES AND SOFT TISSUES: Advanced facet hypertrophy in the lower lumbar spine. Mild arthrosis of the hips. Asymmetric degenerative arthrosis of the right femorotibial joint, greatest laterally with a large  loose body in the anterior right knee joint. No acute or significant osseous findings. BOWEL: Mucosal enhancement in multiple lower abdominal and pelvic small bowel segments consistent with nonspecific enteritis. No mesenteric inflammatory changes. Advanced sigmoid diverticulosis without evidence of diverticulitis. PELVIS: The bladder and prostate are unremarkable. No pelvic fluid collections or masses. Bilateral inguinal fat hernias. IMPRESSION: 1. Left lower extremity arterial occlusive disease with thrombosis of the common femoral artery and popliteal artery, mostly occluded superficial femoral artery with segmental reconstitution and 60% stenosis in Hunters canal, and no popliteal trifurcation arterial enhancement, consistent with critical limb ischemia risk. 2. Right peroneal artery occlusion proximally with reconstitution and preserved distal tibial runoff. No other flow-limiting stenosis in the right lower extremity. 3. Advanced sigmoid diverticulosis without diverticulitis. 4. Nonspecific enteritis involving multiple lower abdominal and pelvic small bowel segments without mesenteric inflammatory changes. 5. Asymmetric osteoarthritis in the right knee with large loose body anteriorly. Electronically signed by: Francis Quam MD 03/25/2024 09:32 PM EDT RP Workstation: [id number]   CT HEAD WO CONTRAST ( ) Result Date: 03/19/2024 EXAM: CT HEAD WITHOUT CONTRAST 03/15/2024 12:20:00 PM TECHNIQUE: CT of the head was performed without the administration of intravenous contrast. Automated exposure control, iterative reconstruction, and/or weight based adjustment of the mA/kV was utilized to reduce the radiation dose to as low as reasonably achievable. COMPARISON: Brain MRI 02/28/2024, Head CT 02/28/2024, and earlier. CLINICAL HISTORY: 78 year old male. Stroke, follow up. Intraparenchymal hematoma of brain, right, without loss of consciousness, initial encounter (HCC). FINDINGS: BRAIN AND VENTRICLES: Hyperdense  left dorsal medullary and axial hemorrhage has largely resolved since 02/28/2024, with a big hyperdense residual on coronal image 38. No regional edema or mass effect. Stable cerebral volume. No ventriculomegaly. Stable gray  white differentiation elsewhere. No extra-axial collection. No mass effect or midline shift. ORBITS: No acute abnormality. SINUSES: No acute abnormality. SOFT TISSUES AND SKULL: Bilateral carotid gland masses with dystrophic calcification better demonstrated on CTA earlier this month. Stable visible aortic and scalp soft tissues. Calcified atherosclerosis of the skull base. No suspicious intracranial vascular hyperdensity. No skull fracture. IMPRESSION: 1. Largely resolved small left dorsal medullary hemorrhage with minimal residual. No edema or mass effect. 2. No new intracranial abnormality. 3. Bilateral parotid gland tumors, see CTA earlier this month. Electronically signed by: Helayne Hurst MD 03/19/2024 03:58 AM EDT RP Workstation: [id number]   CT HEAD WO CONTRAST Result Date: 02/28/2024 CLINICAL DATA:  78 year old male with neurologic deficit, small left dorsal medullary hemorrhage on CT and MRI. Subsequent encounter. EXAM: CT HEAD WITHOUT CONTRAST TECHNIQUE: Contiguous axial images were obtained from the base of the skull through the vertex without intravenous contrast. RADIATION DOSE REDUCTION: This exam was performed according to the departmental dose-optimization program which includes automated exposure control, adjustment of the mA and/or kV according to patient size and/or use of iterative reconstruction technique. COMPARISON:  Brain MRI 0034 hours today. Head CT 2225 hours yesterday. FINDINGS: Brain: Rounded hyperdense lesion/hemorrhage at the left dorsal medulla measuring 8 mm diameter, up to 10 mm craniocaudal now appears stable and size but more circumscribed versus 2225 hours last night (coronal image 30). Estimated volume less than 1 mL. No significant regional mass effect.  No extra-axial or intraventricular extension of blood. No ventriculomegaly. No midline shift. Stable gray-white matter differentiation throughout the brain. No cortically based acute infarct identified. Vascular: Calcified atherosclerosis at the skull base. No suspicious intracranial vascular hyperdensity. Skull: Stable and intact. Sinuses/Orbits: Stable mild paranasal sinus mucosal thickening. Tympanic cavities and mastoids well aerated. Other: No acute orbit or scalp soft tissue finding. IMPRESSION: 1. Small acute left or so medullary intra-axial hemorrhage is more circumscribed but stable size and configuration since presentation, less than 1 mL. 2. No significant intracranial mass effect or other complicating features. 3. No new intracranial abnormality. Electronically Signed   By: VEAR Hurst M.D.   On: 02/28/2024 06:44   MR Brain W and Wo Contrast Result Date: 02/28/2024 CLINICAL DATA:  Initial evaluation for acute neuro deficit, stroke suspected. EXAM: MRI HEAD WITHOUT AND WITH CONTRAST TECHNIQUE: Multiplanar, multiecho pulse sequences of the brain and surrounding structures were obtained without and with intravenous contrast. CONTRAST:  7mL GADAVIST  GADOBUTROL  1 MMOL/ML IV SOLN COMPARISON:  CT from 02/27/2024 FINDINGS: Brain: Generalized age-related cerebral atrophy. Patchy T2/FLAIR hyperintensity involving the periventricular deep white matter both cerebral hemispheres, consistent with chronic small vessel ischemic disease, mild for age. 7 mm rounded focus of signal abnormality seen involving the left dorsal medulla (series 7, image 7), corresponding with abnormality on prior CT. Lesion blooms on susceptibility weighted sequence, consistent with a small acute hemorrhage. Minimal edema within the adjacent medulla without significant regional mass effect. No visible underlying lesion or pathologic enhancement. No other acute intracranial hemorrhage. No other chronic intracranial blood products. No abnormal  foci of restricted diffusion to suggest acute or subacute ischemia. No areas of chronic cortical infarction. No mass lesion, midline shift or mass effect. No hydrocephalus or extra-axial fluid collection. Pituitary gland within normal limits. No abnormal enhancement. Vascular: Major intracranial vascular flow voids are maintained. Skull and upper cervical spine: Craniocervical junction within normal limits. Bone marrow signal intensity normal. No scalp soft tissue abnormality. Sinuses/Orbits: Prior bilateral ocular lens replacement. Mild scattered mucosal thickening about the ethmoidal air  cells. Paranasal sinuses are otherwise largely clear. Trace left mastoid effusion noted, of doubtful significance. 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 These are better evaluated on prior CTA. IMPRESSION: 1. 7 mm acute hemorrhage involving the left dorsal medulla, corresponding with abnormality on prior CT. Minimal edema within the adjacent medulla without significant regional mass effect. No visible underlying lesion or pathologic enhancement. 2. No other acute intracranial abnormality. 3. Underlying age-related cerebral atrophy with mild chronic small vessel ischemic disease. Electronically Signed   By: Morene Hoard M.D.   On: 02/28/2024 01:23   CT ANGIO HEAD NECK W WO CM Result Date: 02/28/2024 CLINICAL DATA:  Initial evaluation for acute neuro deficit, stroke. EXAM: CT ANGIOGRAPHY HEAD AND NECK WITH AND WITHOUT CONTRAST TECHNIQUE: Multidetector CT imaging of the head and neck was performed using the standard protocol during bolus administration of intravenous contrast. Multiplanar CT image reconstructions and MIPs were obtained to evaluate the vascular anatomy. Carotid stenosis measurements (when applicable) are obtained utilizing NASCET criteria, using the distal internal carotid diameter as the denominator.  RADIATION DOSE REDUCTION: This exam was performed according to the departmental dose-optimization program which includes automated exposure control, adjustment of the mA and/or kV according to patient size and/or use of iterative reconstruction technique. CONTRAST:  75mL OMNIPAQUE IOHEXOL 350 MG/ML SOLN COMPARISON:  CT from earlier the same day. FINDINGS: CTA NECK FINDINGS Aortic arch: Visualized aortic arch within normal limits for caliber with standard 3 vessel morphology. Mild atheromatous change about the arch itself. Right carotid system: Right common and internal carotid arteries are patent without dissection. Atheromatous change about the right carotid bulb with associated stenosis of up to 60% by NASCET criteria. Left carotid system: Left common and internal carotid arteries are patent without dissection. Atheromatous change about the left carotid bulb without hemodynamically significant stenosis. Vertebral arteries: Both vertebral arteries arise from subclavian arteries. No proximal subclavian artery stenosis vertebral arteries patent without stenosis or dissection. Skeleton: No worrisome osseous lesions. Mild for age cervical spondylosis. Other neck: No other acute finding hyperdense nodular mass lesions are seen within the parotid glands bilaterally, largest of which on the right measures 2.4 cm, with the largest on the left measuring 1.3 cm. Few scattered associated calcifications. 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 Upper chest: Emphysema.  No other acute finding. Review of the MIP images confirms the above findings CTA HEAD FINDINGS Anterior circulation: The mild atheromatous change about the carotid siphons without hemodynamically significant stenosis. A1 segments patent bilaterally. Normal anterior communicating artery complex. Anterior cerebral arteries patent without stenosis. No M1 stenosis or occlusion. Distal MCA branches  perfused and symmetric. Posterior circulation: Both V4 segments widely patent without stenosis. Left PICA patent. Right PICA not seen. Basilar patent without stenosis. Superior cerebellar arteries patent bilaterally. Left PCA supplied via the basilar. Fetal type origin of the right PCA. Both PCAs patent without significant stenosis. Venous sinuses: Patent allowing for timing the contrast bolus. Anatomic variants: As above.  No aneurysm. Review of the MIP images confirms the above findings IMPRESSION: 1. Negative CTA for large vessel occlusion or other emergent finding. 2. Atheromatous change about the right carotid bulb with associated stenosis of up to 60% by NASCET criteria. 3. Additional mild atheromatous change about the left carotid bulb and carotid siphons without hemodynamically significant stenosis. 4. Multiple hyperdense nodular lesions within the parotid glands bilaterally, largest of which on the right  measures 2.4 cm. 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 Aortic Atherosclerosis (ICD10-I70.0) and Emphysema (ICD10-J43.9). Electronically Signed   By: Morene Hoard M.D.   On: 02/28/2024 01:00   CT HEAD WO CONTRAST Result Date: 02/27/2024 CLINICAL DATA:  Syncope EXAM: CT HEAD WITHOUT CONTRAST TECHNIQUE: Contiguous axial images were obtained from the base of the skull through the vertex without intravenous contrast. RADIATION DOSE REDUCTION: This exam was performed according to the departmental dose-optimization program which includes automated exposure control, adjustment of the mA and/or kV according to patient size and/or use of iterative reconstruction technique. COMPARISON:  MRI 09/02/2020, CT brain 09/02/2020 FINDINGS: Brain: The ventricles are nonenlarged. Mild atrophy. Minimal chronic small vessel ischemic changes of the white matter. Smudgy hyperdense focus measuring 8 mm in the lateral left medulla oblongata, series  4, image 12. No surrounding mass effect. Fourth ventricle patent. Vascular: Carotid vascular calcification. Skull: No fracture Sinuses/Orbits: Patchy mucosal thickening Other: None IMPRESSION: 1. 8 mm hyperdense focus within the left lateral medulla oblongata, this could represent a small hyperdense or hemorrhagic mass vs small focus of hemorrhagic infarct vs aneurysm felt less likely. Suggest further evaluation with MRI with and without contrast, in addition to MRA. 2. Atrophy and mild chronic small vessel ischemic changes of the white matter Critical Value/emergent results were called by telephone at the time of interpretation on 02/27/2024 at 11:07 pm to provider Dr. Neysa, who verbally acknowledged these results. Electronically Signed   By: Luke Bun M.D.   On: 02/27/2024 23:08    Labs: BNP (last 3 results) No results for input(s): BNP in the last 8760 hours. Basic Metabolic Panel: Recent Labs  Lab 03/25/24 2001 03/26/24 0337 03/26/24 0640 03/27/24 0545 03/28/24 0324  NA 127* 135 133* 131* 131*  K 5.7* 3.9 4.3 4.4 4.1  CL 94*  --  100 98 97*  CO2  --   --  25 24 24   GLUCOSE 105*  --  104* 118* 114*  BUN 28*  --  9 9 14   CREATININE 0.90  --  0.69 0.76 0.77  CALCIUM   --   --  8.1* 8.2* 8.3*   Liver Function Tests: Recent Labs  Lab 03/26/24 0640 03/27/24 0545  AST 33 26  ALT 40 27  ALKPHOS 51 47  BILITOT 0.6 1.1  PROT 5.6* 5.4*  ALBUMIN 2.9* 2.7*   No results for input(s): LIPASE, AMYLASE in the last 168 hours. No results for input(s): AMMONIA in the last 168 hours. CBC: Recent Labs  Lab 03/25/24 1942 03/25/24 2001 03/26/24 0337 03/26/24 0640 03/27/24 0545 03/28/24 0324  WBC 10.8*  --   --  5.1 9.5 10.5  NEUTROABS 8.6*  --   --  3.1  --   --   HGB 15.0 15.6 12.6* 13.2 12.1* 11.3*  HCT 44.2 46.0 37.0* 38.4* 35.1* 32.9*  MCV 90.9  --   --  89.9 90.2 90.4  PLT 157  --   --  124* 123* 135*   CBG: No results for input(s): GLUCAP in the last 168  hours. Hgb A1c No results for input(s): HGBA1C in the last 72 hours. Anemia work up No results for input(s): VITAMINB12, FOLATE, FERRITIN, TIBC, IRON, RETICCTPCT in the last 72 hours. Cardiac Enzymes: No results for input(s): CKTOTAL, CKMB, CKMBINDEX, TROPONINI in the last 168 hours. BNP: Invalid input(s): POCBNP D-Dimer No results for input(s): DDIMER in the last 72 hours. Lipid Profile Recent Labs    03/27/24  0545  CHOL 92  HDL 44  LDLCALC 42  TRIG 32  CHOLHDL 2.1   Thyroid  function studies No results for input(s): TSH, T4TOTAL, T3FREE, THYROIDAB in the last 72 hours.  Invalid input(s): FREET3 Urinalysis    Component Value Date/Time   COLORURINE YELLOW 02/27/2024 2142   APPEARANCEUR CLEAR 02/27/2024 2142   LABSPEC 1.023 02/27/2024 2142   PHURINE 5.0 02/27/2024 2142   GLUCOSEU NEGATIVE 02/27/2024 2142   HGBUR NEGATIVE 02/27/2024 2142   BILIRUBINUR NEGATIVE 02/27/2024 2142   KETONESUR 5 (A) 02/27/2024 2142   PROTEINUR NEGATIVE 02/27/2024 2142   NITRITE NEGATIVE 02/27/2024 2142   LEUKOCYTESUR NEGATIVE 02/27/2024 2142   Sepsis Labs Recent Labs  Lab 03/25/24 1942 03/26/24 0640 03/27/24 0545 03/28/24 0324  WBC 10.8* 5.1 9.5 10.5   Microbiology No results found for this or any previous visit (from the past 240 hours).   Time coordinating discharge: 35 minutes  SIGNED: Mennie LAMY, MD  [facility] 03/28/2024, 1:12 PM  If 7PM-7AM, please contact night-coverage [email or web address]

## 2024-03-28 NOTE — Progress Notes (Signed)
 DISCHARGE NOTE HOME Kyle Kelley to be discharged Home per MD order. Discussed prescriptions and follow up appointments with the patient. Prescriptions given to patient; medication list explained in detail. Patient verbalized understanding.  Skin clean, dry and intact without evidence of skin break down, no evidence of skin tears noted. IV catheter discontinued intact. Site without signs and symptoms of complications. Dressing and pressure applied. Pt denies pain at the site currently. No complaints noted.  See LDA for surgical incision right leg and groin access Patient free of other lines, drains, and wounds.   An After Visit Summary (AVS) was printed and given to the patient. Patient escorted via wheelchair, and discharged home via private auto.  Peyton [name], RN

## 2024-03-30 LAB — BPAM RBC
Blood Product Expiration Date: 202511232359
Blood Product Expiration Date: 202511232359
Unit Type and Rh: 5100
Unit Type and Rh: 5100

## 2024-03-30 LAB — TYPE AND SCREEN
ABO/RH(D): O POS
Antibody Screen: NEGATIVE
Unit division: 0
Unit division: 0

## 2024-04-01 ENCOUNTER — Ambulatory Visit

## 2024-04-01 ENCOUNTER — Ambulatory Visit: Admitting: Physical Therapy

## 2024-04-03 ENCOUNTER — Ambulatory Visit

## 2024-04-04 ENCOUNTER — Telehealth: Payer: Self-pay

## 2024-04-04 NOTE — Telephone Encounter (Signed)
 Patient called reporting swelling of his left leg s/p LLE thrombectomy on 03/25/24.  Advised that swelling is to be expected after surgery. Advised to keep incisions clean and dry. Advised to elevate leg above heart level when not walking. Advised to walk as much as tolerated. Advised patient to wrap leg with ACE wrap from toes to below knee using moderate compression.  Pt knows to go ED if signs of infection develop (fever, purulent drainage, redness, odor), the wound is bleeding or the incisions dehisce.

## 2024-04-09 ENCOUNTER — Ambulatory Visit

## 2024-04-09 ENCOUNTER — Ambulatory Visit: Admitting: Physical Therapy

## 2024-04-10 ENCOUNTER — Telehealth: Payer: Self-pay | Admitting: Cardiology

## 2024-04-10 NOTE — Telephone Encounter (Signed)
 Spoke to  wife and husband  sent up to 18 minutes discussing  about what labs are needed   In July, patient received a phone call - informing patient he needs to come in 4 months to have Lipid ,ALT.   RN informed patient Lipid is not need because he had lipid testing in Oct 2025. Only  need ALT  Patient and wife states they may or may not  see if Dr Charlott can do the labs at his office when they see him in Dec,  Rn informed both that would fine, or they come to [facility]  at their own schedule  Both voiced understanding,

## 2024-04-10 NOTE — Telephone Encounter (Signed)
 Pts wife would like to know what the pt is being tested for tomorrow at the lab

## 2024-04-12 ENCOUNTER — Ambulatory Visit: Admitting: Physical Therapy

## 2024-04-15 ENCOUNTER — Ambulatory Visit: Attending: Surgery | Admitting: Surgery

## 2024-04-15 ENCOUNTER — Telehealth: Payer: Self-pay

## 2024-04-15 ENCOUNTER — Other Ambulatory Visit (HOSPITAL_COMMUNITY): Payer: Self-pay

## 2024-04-15 ENCOUNTER — Encounter: Payer: Self-pay | Admitting: Surgery

## 2024-04-15 ENCOUNTER — Other Ambulatory Visit: Payer: Self-pay

## 2024-04-15 VITALS — BP 129/70 | HR 59 | Temp 98.1°F

## 2024-04-15 DIAGNOSIS — T8149XA Infection following a procedure, other surgical site, initial encounter: Secondary | ICD-10-CM

## 2024-04-15 MED ORDER — SULFAMETHOXAZOLE-TRIMETHOPRIM 800-160 MG PO TABS
1.0000 | ORAL_TABLET | Freq: Two times a day (BID) | ORAL | 0 refills | Status: DC
  Filled 2024-04-15: qty 28, 14d supply, fill #0

## 2024-04-15 NOTE — Progress Notes (Signed)
   Patient name: Kyle Kelley MRN: [id number] DOB: 02/04/1946 Sex: male  REASON FOR VISIT:    Add on  HISTORY OF PRESENT ILLNESS:   Kyle Kelley is a 78 y.o. male who presented to the emergency department on 03/25/2024 with acute left leg ischemia.  He had a history of atrial fibrillation but had been off of his Eliquis  secondary to intracerebral hemorrhage.  On 03/26/2024 he underwent left leg thrombectomy, left tibioperoneal trunk endarterectomy with vein patch angioplasty and stenting of the left SFA.  He had a palpable DP pulse afterwards.  Over the weekend he called complaining of drainage from the vein harvest site.  He is brought back in for a wound check  CURRENT MEDICATIONS:    Current Outpatient Medications  Medication Sig Dispense Refill   apixaban  (ELIQUIS ) 5 MG TABS tablet Take 5 mg by mouth 2 (two) times daily.     clopidogrel (PLAVIX) 75 MG tablet Take 1 tablet (75 mg total) by mouth daily. 30 tablet 0   diltiazem  (TIAZAC ) 300 MG 24 hr capsule Take 300 mg by mouth daily.     EPINEPHrine  (EPIPEN  IJ) Inject 1 application  as directed as needed (anaphylaxis).     metoprolol  succinate (TOPROL -XL) 25 MG 24 hr tablet Take 1 tablet (25 mg total) by mouth daily. 90 tablet 3   polyethylene glycol powder (GLYCOLAX/MIRALAX) 17 GM/SCOOP powder Take 17 g by mouth daily as needed for mild constipation. Dissolve 1 capful (17g) in 4-8 ounces of liquid and take by mouth daily. 238 g 0   rosuvastatin  (CRESTOR ) 10 MG tablet Take 1 tablet (10 mg total) by mouth daily. 90 tablet 3   sulfamethoxazole-trimethoprim (BACTRIM DS) 800-160 MG tablet Take 1 tablet by mouth 2 (two) times daily for 14 days. 28 tablet 0   No current facility-administered medications for this visit.    REVIEW OF SYSTEMS:   [X]  denotes positive finding, [ ]  denotes negative finding Cardiac  Comments:  Chest pain or chest pressure:    Shortness of breath upon exertion:    Short of  breath when lying flat:    Irregular heart rhythm:    Constitutional    Fever or chills:      PHYSICAL EXAM:   Vitals:   04/15/24 1456  BP: 129/70  Pulse: (!) 59  Temp: 98.1 F (36.7 C)  SpO2: 97%    GENERAL: The patient is a well-nourished male, in no acute distress. The vital signs are documented above. CARDIOVASCULAR: There is a regular rate and rhythm. PULMONARY: Non-labored respirations Clear drainage from the distal incision with erythema around the entire incision  STUDIES:      MEDICAL ISSUES:   The patient has a incisional infection.  I took out the distal staples and open the wound with a Q-tip.  There was old hematoma present.  I was able to pass a Q-tip all the way to its hub however this was more superficial and not going towards the graft.  I evacuated old hematoma.  The wound was packed with gauze.  I am starting him on antibiotics.  Home health will come out and do dressing changes.  He has a follow-up with us  in 1 week  Malvina Serene CLORE, MD, FACS [facility] [phone] Pager [phone]

## 2024-04-15 NOTE — Telephone Encounter (Signed)
 Pt's wife called reporting hole at bottom of ankle incision with bloody drainage.   Appt made with Dr. Serene for incision check.

## 2024-04-16 ENCOUNTER — Ambulatory Visit

## 2024-04-22 ENCOUNTER — Telehealth: Payer: Self-pay

## 2024-04-22 NOTE — Telephone Encounter (Signed)
 Mandy, RN from [facility] called reporting patient's wound continues to put our purulent and bloody drainage.  The wound is also macerated.  There is strong odor and redness,  No fever.  Lorn reports the wound is showing no improvement but it not worse.   Lorn will continue the wound care as ordered by Dr. Serene, 04/15/24.  Patient reports compliance with antibiotic, no missed doses.

## 2024-04-23 ENCOUNTER — Ambulatory Visit: Attending: Vascular Surgery | Admitting: Physician Assistant

## 2024-04-23 ENCOUNTER — Ambulatory Visit

## 2024-04-23 VITALS — BP 135/76 | HR 76 | Temp 98.0°F | Wt 168.0 lb

## 2024-04-23 DIAGNOSIS — I739 Peripheral vascular disease, unspecified: Secondary | ICD-10-CM

## 2024-04-23 DIAGNOSIS — T8149XA Infection following a procedure, other surgical site, initial encounter: Secondary | ICD-10-CM

## 2024-04-23 NOTE — Progress Notes (Signed)
 POST OPERATIVE OFFICE NOTE    CC:  F/u for surgery  HPI:  This is a 78 y.o. male who is s/p  1.  Left lower extremity thromboembolectomy via common femoral and below-knee popliteal artery exposure incisions using 3 and 4 Fogarty balloons 2.  Left tibioperoneal trunk endarterectomy with vein patch angioplasty 3.  Harvest left greater saphenous vein 4.  Left lower extremity angiogram 5.  Stent left SFA with 6 x 150 mm Eluvia  on 03/26/2024 by Dr. Sheree.    Pt was seen last week by Dr. Serene for concerns of non healing wound.  He was found to have an incisional infection and the distal staples were removed and wound opened with a cotton tip applicator, which he was able to insert to its hub but this was more superficial and not going towards the graft.  Old hematoma was evacuated.  His wound was packed and he was started on Bactrim .  HH was ordered for dressing changes.   The [facility] nurse called yesterday with concerns on infection.  He was scheduled for a wound check.   Pt returns today for follow up and here with his wife.  Pt states he doesn't really have pain in the foot.  He requests staples be removed from groin.  His wife states they are on day 8 of the abx.  He has not had fever.  He is compliant with plavix  and Eliquis .    Allergies  Allergen Reactions   Ace Inhibitors Anaphylaxis   Rythmol [Propafenone] Shortness Of Breath   Nitrofurantoin Rash    Current Outpatient Medications  Medication Sig Dispense Refill   apixaban  (ELIQUIS ) 5 MG TABS tablet Take 5 mg by mouth 2 (two) times daily.     clopidogrel  (PLAVIX ) 75 MG tablet Take 1 tablet (75 mg total) by mouth daily. 30 tablet 0   diltiazem  (TIAZAC ) 300 MG 24 hr capsule Take 300 mg by mouth daily.     EPINEPHrine  (EPIPEN  IJ) Inject 1 application  as directed as needed (anaphylaxis).     metoprolol  succinate (TOPROL -XL) 25 MG 24 hr tablet Take 1 tablet (25 mg total) by mouth daily. 90 tablet 3   polyethylene glycol powder  (GLYCOLAX /MIRALAX ) 17 GM/SCOOP powder Take 17 g by mouth daily as needed for mild constipation. Dissolve 1 capful (17g) in 4-8 ounces of liquid and take by mouth daily. 238 g 0   rosuvastatin  (CRESTOR ) 10 MG tablet Take 1 tablet (10 mg total) by mouth daily. 90 tablet 3   sulfamethoxazole -trimethoprim  (BACTRIM  DS) 800-160 MG tablet Take 1 tablet by mouth 2 (two) times daily for 14 days. 28 tablet 0   No current facility-administered medications for this visit.     ROS:  See HPI  Physical Exam:  Today's Vitals   04/23/24 1015  BP: 135/76  Pulse: 76  Temp: 98 F (36.7 C)  SpO2: 98%  Weight: 168 lb (76.2 kg)  PainSc: 5    Body mass index is 22.16 kg/m.   Incision:  left groin incision has healed.  Left lower leg packing removed and is clean.  RN who saw the wound last week reports it looks much better.     Extremities:  + left peroneal and AT doppler signal     Assessment/Plan:  This is a 78 y.o. male who is s/p: 1.  Left lower extremity thromboembolectomy via common femoral and below-knee popliteal artery exposure incisions using 3 and 4 Fogarty balloons 2.  Left tibioperoneal trunk endarterectomy with  vein patch angioplasty 3.  Harvest left greater saphenous vein 4.  Left lower extremity angiogram 5.  Stent left SFA with 6 x 150 mm Eluvia  on 03/26/2024 by Dr. Sheree.  -pt with left peroneal and AT doppler flow -packing removed from wound and it appears clean.  The wound tracks proximal but not deep.  I did repack the wound with 1 gauze.  I instructed them to remove the packing about 30 minutes prior to [facility] nurse coming and shower with soap and water  then put dry gauze over wound until nurse arrives to repack dressing.  They will do this M/W/F before her visits.  -he will finish the Bactrim .   -f/u one week with PA or Dr. [place] clinic.  Discussed possibly needing washout in OR but wound is clean today.  Discussed possibility of wound vac vs continued packing.  All questions  were answered. -discussed protecting his feet and not going barefooted.   -continue plavix /eliquis chrystie   Lucie Apt, [facility] [phone]   Clinic MD:  Gretta

## 2024-04-24 ENCOUNTER — Ambulatory Visit

## 2024-04-29 ENCOUNTER — Ambulatory Visit: Attending: Surgery | Admitting: Physician Assistant

## 2024-04-29 ENCOUNTER — Encounter: Payer: Self-pay | Admitting: Physician Assistant

## 2024-04-29 ENCOUNTER — Other Ambulatory Visit: Payer: Self-pay

## 2024-04-29 ENCOUNTER — Encounter: Payer: Self-pay | Admitting: Surgery

## 2024-04-29 ENCOUNTER — Telehealth: Payer: Self-pay

## 2024-04-29 VITALS — BP 107/64 | HR 76 | Temp 97.7°F | Wt 171.1 lb

## 2024-04-29 DIAGNOSIS — I739 Peripheral vascular disease, unspecified: Secondary | ICD-10-CM

## 2024-04-29 DIAGNOSIS — T8149XA Infection following a procedure, other surgical site, initial encounter: Secondary | ICD-10-CM

## 2024-04-29 NOTE — Telephone Encounter (Signed)
 Mandy, RN from [employer] called reporting patient's wound is infected - purulent drainage and increased deshiscence on the proximal end of the incision.    Triage appt scheduled

## 2024-04-29 NOTE — H&P (View-Only) (Signed)
 POST OPERATIVE OFFICE NOTE    CC:  F/u for surgery  HPI:  This is a 78 y.o. male who presents for wound check follow up. He recently underwent Left lower extremity thromboembolectomy via common femoral and below-knee popliteal artery exposure; Left tibioperoneal trunk endarterectomy with vein patch angioplasty; Harvest left greater saphenous vein and Left lower extremity angiogram with placement of left SFA stent on 03/26/2024 by Dr. Sheree.    He has been seen weekly over past two weeks due to infected hematoma in the lower aspect of his distal incision. This was evacuated in the office and wound was packed. He has been packing the wound with assistance of [facility] RN 3x/week.   The [facility] RN called again today with concerns of the wound appearing worse.Patient reports overall doing okay. No increased drainage from wound but they do mention malodor and that the wound has now opened at the top of the incision as well. He took about 10 days of Bactrim  but felt that it began to make him fatigued/ short of breath so he stopped it. Does now recall prior issue with Sulfa  antibiotics in the past. He denies fever or chills.   Allergies  Allergen Reactions   Ace Inhibitors Anaphylaxis   Rythmol [Propafenone] Shortness Of Breath   Nitrofurantoin Rash    Current Outpatient Medications  Medication Sig Dispense Refill   apixaban  (ELIQUIS ) 5 MG TABS tablet Take 5 mg by mouth 2 (two) times daily.     diltiazem  (TIAZAC ) 300 MG 24 hr capsule Take 300 mg by mouth daily.     EPINEPHrine  (EPIPEN  IJ) Inject 1 application  as directed as needed (anaphylaxis).     metoprolol  succinate (TOPROL -XL) 25 MG 24 hr tablet Take 1 tablet (25 mg total) by mouth daily. 90 tablet 3   polyethylene glycol powder (GLYCOLAX /MIRALAX ) 17 GM/SCOOP powder Take 17 g by mouth daily as needed for mild constipation. Dissolve 1 capful (17g) in 4-8 ounces of liquid and take by mouth daily. 238 g 0   rosuvastatin  (CRESTOR ) 10 MG tablet Take 1  tablet (10 mg total) by mouth daily. 90 tablet 3   sulfamethoxazole -trimethoprim  (BACTRIM  DS) 800-160 MG tablet Take 1 tablet by mouth 2 (two) times daily for 14 days. 28 tablet 0   No current facility-administered medications for this visit.     ROS:  See HPI  Physical Exam: Vitals:   04/29/24 1255  BP: 107/64  Pulse: 76  Temp: 97.7 F (36.5 C)    Incision: left BK popliteal incision as shown below. Staples removed. Wound probed and there is a lot of devitalized tissue in wound bed with malodor    Assessment/Plan:  This is a 78 y.o. male who is s/p: Left lower extremity thromboembolectomy via common femoral and below-knee popliteal artery exposure; Left tibioperoneal trunk endarterectomy with vein patch angioplasty; Harvest left greater saphenous vein and Left lower extremity angiogram with placement of left SFA stent on 03/26/2024 by Dr. Sheree.  Left below knee incision continues to dehisce further. Has been having HH RN change packing 3x/week. RN noted opening now in proximal aspect of incision as well as malodor. With increased wound dehiscence and devitalized tissue deeper in wound bed I recommend washout in the OR with likely VAC placement. Discussed this with Dr. Serene and he also saw patient and agrees with I&D in the OR.  - He will continue washing incisions with mild soap and water  and packing changes with help of [facility] RN - Patient is on  Eliquis  which will need to be held  - We will try to get home [facility] approval so that this can be done as an outpatient - Plan will be for I&D and washout with Vac application of left leg incision on Friday 12/5 with Dr. Sheree Curry Damme, [facility] [phone]   Clinic MD:  Serene

## 2024-04-29 NOTE — Progress Notes (Signed)
 POST OPERATIVE OFFICE NOTE    CC:  F/u for surgery  HPI:  This is a 78 y.o. male who presents for wound check follow up. He recently underwent Left lower extremity thromboembolectomy via common femoral and below-knee popliteal artery exposure; Left tibioperoneal trunk endarterectomy with vein patch angioplasty; Harvest left greater saphenous vein and Left lower extremity angiogram with placement of left SFA stent on 03/26/2024 by Dr. Sheree.    He has been seen weekly over past two weeks due to infected hematoma in the lower aspect of his distal incision. This was evacuated in the office and wound was packed. He has been packing the wound with assistance of [facility] RN 3x/week.   The [facility] RN called again today with concerns of the wound appearing worse.Patient reports overall doing okay. No increased drainage from wound but they do mention malodor and that the wound has now opened at the top of the incision as well. He took about 10 days of Bactrim  but felt that it began to make him fatigued/ short of breath so he stopped it. Does now recall prior issue with Sulfa  antibiotics in the past. He denies fever or chills.   Allergies  Allergen Reactions   Ace Inhibitors Anaphylaxis   Rythmol [Propafenone] Shortness Of Breath   Nitrofurantoin Rash    Current Outpatient Medications  Medication Sig Dispense Refill   apixaban  (ELIQUIS ) 5 MG TABS tablet Take 5 mg by mouth 2 (two) times daily.     diltiazem  (TIAZAC ) 300 MG 24 hr capsule Take 300 mg by mouth daily.     EPINEPHrine  (EPIPEN  IJ) Inject 1 application  as directed as needed (anaphylaxis).     metoprolol  succinate (TOPROL -XL) 25 MG 24 hr tablet Take 1 tablet (25 mg total) by mouth daily. 90 tablet 3   polyethylene glycol powder (GLYCOLAX /MIRALAX ) 17 GM/SCOOP powder Take 17 g by mouth daily as needed for mild constipation. Dissolve 1 capful (17g) in 4-8 ounces of liquid and take by mouth daily. 238 g 0   rosuvastatin  (CRESTOR ) 10 MG tablet Take 1  tablet (10 mg total) by mouth daily. 90 tablet 3   sulfamethoxazole -trimethoprim  (BACTRIM  DS) 800-160 MG tablet Take 1 tablet by mouth 2 (two) times daily for 14 days. 28 tablet 0   No current facility-administered medications for this visit.     ROS:  See HPI  Physical Exam: Vitals:   04/29/24 1255  BP: 107/64  Pulse: 76  Temp: 97.7 F (36.5 C)    Incision: left BK popliteal incision as shown below. Staples removed. Wound probed and there is a lot of devitalized tissue in wound bed with malodor    Assessment/Plan:  This is a 78 y.o. male who is s/p: Left lower extremity thromboembolectomy via common femoral and below-knee popliteal artery exposure; Left tibioperoneal trunk endarterectomy with vein patch angioplasty; Harvest left greater saphenous vein and Left lower extremity angiogram with placement of left SFA stent on 03/26/2024 by Dr. Sheree.  Left below knee incision continues to dehisce further. Has been having HH RN change packing 3x/week. RN noted opening now in proximal aspect of incision as well as malodor. With increased wound dehiscence and devitalized tissue deeper in wound bed I recommend washout in the OR with likely VAC placement. Discussed this with Dr. Serene and he also saw patient and agrees with I&D in the OR.  - He will continue washing incisions with mild soap and water  and packing changes with help of [facility] RN - Patient is on  Eliquis  which will need to be held  - We will try to get home [facility] approval so that this can be done as an outpatient - Plan will be for I&D and washout with Vac application of left leg incision on Friday 12/5 with Dr. Sheree Curry Damme, [facility] [phone]   Clinic MD:  Serene

## 2024-04-30 ENCOUNTER — Ambulatory Visit

## 2024-04-30 ENCOUNTER — Other Ambulatory Visit: Payer: Self-pay | Admitting: Physician Assistant

## 2024-04-30 DIAGNOSIS — T8141XS Infection following a procedure, superficial incisional surgical site, sequela: Secondary | ICD-10-CM

## 2024-05-01 ENCOUNTER — Other Ambulatory Visit: Payer: Self-pay

## 2024-05-01 ENCOUNTER — Encounter (HOSPITAL_COMMUNITY): Payer: Self-pay | Admitting: Vascular Surgery

## 2024-05-01 ENCOUNTER — Ambulatory Visit

## 2024-05-01 NOTE — Progress Notes (Addendum)
 SDW CALL  Patient was given pre-op instructions over the phone. The opportunity was given for the patient to ask questions. No further questions asked. Patient verbalized understanding of instructions given.   PCP - Charlott Dorn LABOR, MD Cardiologist - Shlomo Wilbert SAUNDERS, MD   PPM/ICD - denies Device Orders - n/a Rep Notified - n/a  Chest x-ray -  EKG - 03-25-24 Stress Test - 2006 ECHO - 03-26-24 Cardiac Cath - 2006  Sleep Study - denies CPAP - n/a  DM -denies  Blood Thinner Instructions:apixaban  (ELIQUIS ) last dose 04-29-24 Aspirin  Instructions:denies  ERAS Protcol -NPO  COVID TEST- n/a  Anesthesia review: Yes, HX of Htn Afib, stroke   -------------  SDW INSTRUCTIONS given:  Your procedure is scheduled on May 03, 2024.  Report to [facility] Main Entrance A at 8:30 A.M., and check in at the Admitting office.  Call this number if you have problems the morning of surgery:  [phone]   Remember:  Do not eat or drink after midnight the night before your surgery   Take these medicines the morning of surgery with A SIP OF WATER   diltiazem  (TIAZAC )  metoprolol  succinate (TOPROL -XL)  rosuvastatin  (CRESTOR )   As of today, STOP taking any Aspirin  (unless otherwise instructed by your surgeon) Aleve, Naproxen, Ibuprofen, Motrin, Advil, Goody's, BC's, all herbal medications, fish oil, and all vitamins.                     Questions were answered. Patient verbalized understanding of instructions.     Patient denies shortness of breath, fever, cough and chest pain over the phone call  All instructions explained to the patient, with a verbal understanding of the material. Patient agrees to go over the instructions while at home for a better understanding.

## 2024-05-02 NOTE — Anesthesia Preprocedure Evaluation (Signed)
 Anesthesia Evaluation  Patient identified by MRN, date of birth, ID band Patient awake    Reviewed: Allergy & Precautions, NPO status , Patient's Chart, lab work & pertinent test results, reviewed documented beta blocker date and time   Airway Mallampati: II  TM Distance: >3 FB Neck ROM: Full    Dental  (+) Teeth Intact, Dental Advisory Given   Pulmonary former smoker   Pulmonary exam normal breath sounds clear to auscultation       Cardiovascular hypertension, Pt. on medications and Pt. on home beta blockers + Peripheral Vascular Disease  + dysrhythmias Atrial Fibrillation + Valvular Problems/Murmurs AS  Rhythm:Irregular Rate:Abnormal + Systolic murmurs    Neuro/Psych CVA    GI/Hepatic negative GI ROS, Neg liver ROS,,,  Endo/Other  negative endocrine ROS    Renal/GU negative Renal ROS   S/p prostatectomy     Musculoskeletal negative musculoskeletal ROS (+)    Abdominal   Peds  Hematology  (+) Blood dyscrasia (Eliquis , Plavix )   Anesthesia Other Findings Day of surgery medications reviewed with the patient.  Reproductive/Obstetrics                              Anesthesia Physical Anesthesia Plan  ASA: 4  Anesthesia Plan: General   Post-op Pain Management: Tylenol  PO (pre-op)*   Induction: Intravenous  PONV Risk Score and Plan: 2 and Dexamethasone , Ondansetron  and Treatment may vary due to age or medical condition  Airway Management Planned: Oral ETT  Additional Equipment:   Intra-op Plan:   Post-operative Plan: Extubation in OR  Informed Consent: I have reviewed the patients History and Physical, chart, labs and discussed the procedure including the risks, benefits and alternatives for the proposed anesthesia with the patient or authorized representative who has indicated his/her understanding and acceptance.     Dental advisory given  Plan Discussed with:  CRNA  Anesthesia Plan Comments: (PAT note by Lynwood Hope, PA-C: 78 year old male with pertinent history including former smoker, HTN, HLD, CVA, prostate cancer s/p prostatectomy 2014, permanent atrial fibrillation on Eliquis , mild aortic stenosis by gradient (MG 10.8 mmHg by echo 12/26/2023; however, echo 03/18/2024 with no evidence of aortic valve stenosis).  Patient presented to the ED on 03/25/2024 with acute limb ischemia.  Vascular surgery was consulted and he underwent left lower extremity thromboembolectomy via common femoral and below-knee popliteal artery exposure; Left tibioperoneal trunk endarterectomy with vein patch angioplasty; Harvest left greater saphenous vein and Left lower extremity angiogram with placement of left SFA stent on 03/26/2024 by Dr. Sheree.  He has been followed closely for an infected hematoma.  When last seen in follow-up by vascular surgery on 04/29/2024 he was noted to have worsening wound dehiscence.  He was recommended to return to the OR for washout and likely VAC placement.  Patient reports last dose of Eliquis  04/29/2024.  Patient will need day of surgery labs and evaluation.  EKG 03/25/2024: Atrial fibrillation.  Rate 82.  TTE 03/18/2024: 1. Left ventricular ejection fraction, by estimation, is 55 to 60%. The  left ventricle has normal function. The left ventricle has no regional  wall motion abnormalities. Left ventricular diastolic parameters are  indeterminate.  2. Right ventricular systolic function is mildly reduced. The right  ventricular size is mildly enlarged.  3. The mitral valve is grossly normal. No evidence of mitral valve  regurgitation.  4. The aortic valve was not well visualized. Aortic valve regurgitation  is not visualized. Aortic  valve sclerosis is present, with no evidence of  aortic valve stenosis.  5. The inferior vena cava is normal in size with greater than 50%  respiratory variability, suggesting right atrial pressure of 3  mmHg.   Comparison(s): Prior images reviewed side by side. Tricuspid regurgitation  has improved. Aortic stenosis interogation not well performed during this  study.   TTE 12/26/2023: 1. Left ventricular ejection fraction, by estimation, is 55 to 60%. The  left ventricle has normal function. The left ventricle has no regional  wall motion abnormalities. Left ventricular diastolic parameters are  indeterminate.  2. Right ventricular systolic function is normal. The right ventricular  size is mildly enlarged.  3. Left atrial size was mildly dilated.  4. Right atrial size was severely dilated.  5. The mitral valve is normal in structure. No evidence of mitral valve  regurgitation. No evidence of mitral stenosis. Moderate mitral annular  calcification.  6. Tricuspid valve regurgitation is moderate to severe.  7. The aortic valve is calcified. Aortic valve regurgitation is not  visualized. Mild to moderate Aortic stenosis vs paradoxical low flow low  gradient moderate aortic stenosis. Aortic valve area, by VTI measures 1.02  cm. Aortic valve mean gradient  measures 10.8 mmHg. Aortic valve Vmax measures 2.20 m/s, SVI 28, DI 0.3  8. There is mild dilatation of the ascending aorta, measuring 38 mm.  9. The inferior vena cava is normal in size with greater than 50%  respiratory variability, suggesting right atrial pressure of 3 mmHg.   Carotid duplex 12/14/2023: Summary:  Right Carotid: The extracranial vessels were near-normal with only minimal wall  thickening or plaque. Minimal heterogeneous plaque note in the CCA bifurcation.  Left Carotid: The extracranial vessels were near-normal with only minimal wall thickening or plaque. Minimal heterogeneous plaque note in the CCAbifurcation.  Vertebrals: Bilateral vertebral arteries demonstrate antegrade flow.  Subclavians: Normal flow hemodynamics were seen in bilateral subclavian arteries.     )         Anesthesia Quick  Evaluation

## 2024-05-02 NOTE — Progress Notes (Signed)
 Anesthesia Chart Review: Same day workup  78 year old male with pertinent history including former smoker, HTN, HLD, CVA, prostate cancer s/p prostatectomy 2014, permanent atrial fibrillation on Eliquis , mild aortic stenosis by gradient (MG 10.8 mmHg by echo 12/26/2023; however, echo 03/18/2024 with no evidence of aortic valve stenosis).  Patient presented to the ED on 03/25/2024 with acute limb ischemia.  Vascular surgery was consulted and he underwent left  lower extremity thromboembolectomy via common femoral and below-knee popliteal artery exposure; Left tibioperoneal trunk endarterectomy with vein patch angioplasty; Harvest left greater saphenous vein and Left lower extremity angiogram with placement of left SFA stent on 03/26/2024 by Dr. Sheree.  He has been followed closely for an infected hematoma.  When last seen in follow-up by vascular surgery on 04/29/2024 he was noted to have worsening wound dehiscence.  He was recommended to return to the OR for washout and likely VAC placement.  Patient reports last dose of Eliquis  04/29/2024.  Patient will need day of surgery labs and evaluation.  EKG 03/25/2024: Atrial fibrillation.  Rate 82.  TTE 03/18/2024:  1. Left ventricular ejection fraction, by estimation, is 55 to 60%. The  left ventricle has normal function. The left ventricle has no regional  wall motion abnormalities. Left ventricular diastolic parameters are  indeterminate.   2. Right ventricular systolic function is mildly reduced. The right  ventricular size is mildly enlarged.   3. The mitral valve is grossly normal. No evidence of mitral valve  regurgitation.   4. The aortic valve was not well visualized. Aortic valve regurgitation  is not visualized. Aortic valve sclerosis is present, with no evidence of  aortic valve stenosis.   5. The inferior vena cava is normal in size with greater than 50%  respiratory variability, suggesting right atrial pressure of 3 mmHg.    Comparison(s): Prior images reviewed side by side. Tricuspid regurgitation  has improved. Aortic stenosis interogation not well performed during this  study.   TTE 12/26/2023:  1. Left ventricular ejection fraction, by estimation, is 55 to 60%. The  left ventricle has normal function. The left ventricle has no regional  wall motion abnormalities. Left ventricular diastolic parameters are  indeterminate.   2. Right ventricular systolic function is normal. The right ventricular  size is mildly enlarged.   3. Left atrial size was mildly dilated.   4. Right atrial size was severely dilated.   5. The mitral valve is normal in structure. No evidence of mitral valve  regurgitation. No evidence of mitral stenosis. Moderate mitral annular  calcification.   6. Tricuspid valve regurgitation is moderate to severe.   7. The aortic valve is calcified. Aortic valve regurgitation is not  visualized. Mild to moderate Aortic stenosis vs paradoxical low flow low  gradient moderate aortic stenosis. Aortic valve area, by VTI measures 1.02  cm. Aortic valve mean gradient  measures 10.8 mmHg. Aortic valve Vmax measures 2.20 m/s, SVI 28, DI 0.3   8. There is mild dilatation of the ascending aorta, measuring 38 mm.   9. The inferior vena cava is normal in size with greater than 50%  respiratory variability, suggesting right atrial pressure of 3 mmHg.   Carotid duplex 12/14/2023: Summary:  Right Carotid: The extracranial vessels were near-normal with only minimal wall  thickening or plaque. Minimal heterogeneous plaque note in the CCA bifurcation.  Left Carotid: The extracranial vessels were near-normal with only minimal wall thickening or plaque. Minimal heterogeneous plaque note in the CCA bifurcation.  Vertebrals:  Bilateral vertebral  arteries demonstrate antegrade flow.  Subclavians: Normal flow hemodynamics were seen in bilateral subclavian arteries.      Kyle Kelley, Kyle Kelley [facility] Short Stay  Center/Anesthesiology Phone [phone] 05/02/2024 11:40 AM

## 2024-05-03 ENCOUNTER — Inpatient Hospital Stay (HOSPITAL_COMMUNITY)
Admission: RE | Admit: 2024-05-03 | Discharge: 2024-05-20 | DRG: 326 | Disposition: A | Discharge status: Skilled Nursing Facility | Attending: Internal Medicine | Admitting: Internal Medicine

## 2024-05-03 ENCOUNTER — Encounter (HOSPITAL_COMMUNITY): Payer: Self-pay | Admitting: Vascular Surgery

## 2024-05-03 ENCOUNTER — Encounter
Admission: RE | Disposition: A | Discharge status: Skilled Nursing Facility | Payer: Self-pay | Attending: Internal Medicine

## 2024-05-03 ENCOUNTER — Inpatient Hospital Stay (HOSPITAL_COMMUNITY): Payer: Self-pay | Admitting: Physician Assistant

## 2024-05-03 ENCOUNTER — Encounter (HOSPITAL_COMMUNITY): Payer: Self-pay | Admitting: Physician Assistant

## 2024-05-03 DIAGNOSIS — L89152 Pressure ulcer of sacral region, stage 2: Secondary | ICD-10-CM | POA: Diagnosis present

## 2024-05-03 DIAGNOSIS — K668 Other specified disorders of peritoneum: Secondary | ICD-10-CM | POA: Diagnosis not present

## 2024-05-03 DIAGNOSIS — Z83438 Family history of other disorder of lipoprotein metabolism and other lipidemia: Secondary | ICD-10-CM

## 2024-05-03 DIAGNOSIS — J9811 Atelectasis: Secondary | ICD-10-CM | POA: Diagnosis not present

## 2024-05-03 DIAGNOSIS — Z7902 Long term (current) use of antithrombotics/antiplatelets: Secondary | ICD-10-CM

## 2024-05-03 DIAGNOSIS — E43 Unspecified severe protein-calorie malnutrition: Secondary | ICD-10-CM | POA: Diagnosis present

## 2024-05-03 DIAGNOSIS — R0989 Other specified symptoms and signs involving the circulatory and respiratory systems: Secondary | ICD-10-CM | POA: Diagnosis not present

## 2024-05-03 DIAGNOSIS — K449 Diaphragmatic hernia without obstruction or gangrene: Secondary | ICD-10-CM | POA: Diagnosis present

## 2024-05-03 DIAGNOSIS — Z6823 Body mass index (BMI) 23.0-23.9, adult: Secondary | ICD-10-CM

## 2024-05-03 DIAGNOSIS — E872 Acidosis, unspecified: Secondary | ICD-10-CM | POA: Diagnosis not present

## 2024-05-03 DIAGNOSIS — Z9079 Acquired absence of other genital organ(s): Secondary | ICD-10-CM

## 2024-05-03 DIAGNOSIS — Z7901 Long term (current) use of anticoagulants: Secondary | ICD-10-CM | POA: Diagnosis not present

## 2024-05-03 DIAGNOSIS — Z8249 Family history of ischemic heart disease and other diseases of the circulatory system: Secondary | ICD-10-CM | POA: Diagnosis not present

## 2024-05-03 DIAGNOSIS — I350 Nonrheumatic aortic (valve) stenosis: Secondary | ICD-10-CM | POA: Diagnosis present

## 2024-05-03 DIAGNOSIS — E876 Hypokalemia: Secondary | ICD-10-CM | POA: Diagnosis not present

## 2024-05-03 DIAGNOSIS — I10 Essential (primary) hypertension: Secondary | ICD-10-CM | POA: Diagnosis present

## 2024-05-03 DIAGNOSIS — D62 Acute posthemorrhagic anemia: Secondary | ICD-10-CM | POA: Diagnosis not present

## 2024-05-03 DIAGNOSIS — J432 Centrilobular emphysema: Secondary | ICD-10-CM | POA: Diagnosis not present

## 2024-05-03 DIAGNOSIS — F17210 Nicotine dependence, cigarettes, uncomplicated: Secondary | ICD-10-CM | POA: Diagnosis present

## 2024-05-03 DIAGNOSIS — K269 Duodenal ulcer, unspecified as acute or chronic, without hemorrhage or perforation: Secondary | ICD-10-CM | POA: Diagnosis not present

## 2024-05-03 DIAGNOSIS — K261 Acute duodenal ulcer with perforation: Secondary | ICD-10-CM | POA: Diagnosis not present

## 2024-05-03 DIAGNOSIS — T8789 Other complications of amputation stump: Secondary | ICD-10-CM | POA: Diagnosis not present

## 2024-05-03 DIAGNOSIS — K2971 Gastritis, unspecified, with bleeding: Secondary | ICD-10-CM | POA: Diagnosis present

## 2024-05-03 DIAGNOSIS — T148XXA Other injury of unspecified body region, initial encounter: Secondary | ICD-10-CM

## 2024-05-03 DIAGNOSIS — I613 Nontraumatic intracerebral hemorrhage in brain stem: Secondary | ICD-10-CM | POA: Diagnosis not present

## 2024-05-03 DIAGNOSIS — K2970 Gastritis, unspecified, without bleeding: Secondary | ICD-10-CM | POA: Diagnosis not present

## 2024-05-03 DIAGNOSIS — Z4682 Encounter for fitting and adjustment of non-vascular catheter: Secondary | ICD-10-CM | POA: Diagnosis not present

## 2024-05-03 DIAGNOSIS — E869 Volume depletion, unspecified: Secondary | ICD-10-CM | POA: Diagnosis present

## 2024-05-03 DIAGNOSIS — I4821 Permanent atrial fibrillation: Secondary | ICD-10-CM | POA: Diagnosis present

## 2024-05-03 DIAGNOSIS — I4891 Unspecified atrial fibrillation: Secondary | ICD-10-CM

## 2024-05-03 DIAGNOSIS — I110 Hypertensive heart disease with heart failure: Secondary | ICD-10-CM | POA: Diagnosis present

## 2024-05-03 DIAGNOSIS — Z8546 Personal history of malignant neoplasm of prostate: Secondary | ICD-10-CM

## 2024-05-03 DIAGNOSIS — E8809 Other disorders of plasma-protein metabolism, not elsewhere classified: Secondary | ICD-10-CM | POA: Diagnosis present

## 2024-05-03 DIAGNOSIS — R001 Bradycardia, unspecified: Secondary | ICD-10-CM | POA: Diagnosis not present

## 2024-05-03 DIAGNOSIS — Z85828 Personal history of other malignant neoplasm of skin: Secondary | ICD-10-CM | POA: Diagnosis not present

## 2024-05-03 DIAGNOSIS — K659 Peritonitis, unspecified: Secondary | ICD-10-CM | POA: Diagnosis not present

## 2024-05-03 DIAGNOSIS — E871 Hypo-osmolality and hyponatremia: Secondary | ICD-10-CM | POA: Diagnosis present

## 2024-05-03 DIAGNOSIS — Z8673 Personal history of transient ischemic attack (TIA), and cerebral infarction without residual deficits: Secondary | ICD-10-CM

## 2024-05-03 DIAGNOSIS — Z452 Encounter for adjustment and management of vascular access device: Secondary | ICD-10-CM | POA: Diagnosis not present

## 2024-05-03 DIAGNOSIS — I619 Nontraumatic intracerebral hemorrhage, unspecified: Secondary | ICD-10-CM | POA: Diagnosis present

## 2024-05-03 DIAGNOSIS — E782 Mixed hyperlipidemia: Secondary | ICD-10-CM | POA: Diagnosis present

## 2024-05-03 DIAGNOSIS — J90 Pleural effusion, not elsewhere classified: Secondary | ICD-10-CM | POA: Diagnosis not present

## 2024-05-03 DIAGNOSIS — K266 Chronic or unspecified duodenal ulcer with both hemorrhage and perforation: Principal | ICD-10-CM | POA: Diagnosis present

## 2024-05-03 DIAGNOSIS — K2960 Other gastritis without bleeding: Secondary | ICD-10-CM | POA: Diagnosis not present

## 2024-05-03 DIAGNOSIS — I5033 Acute on chronic diastolic (congestive) heart failure: Secondary | ICD-10-CM | POA: Diagnosis present

## 2024-05-03 DIAGNOSIS — J811 Chronic pulmonary edema: Secondary | ICD-10-CM | POA: Diagnosis not present

## 2024-05-03 DIAGNOSIS — R140 Abdominal distension (gaseous): Secondary | ICD-10-CM | POA: Diagnosis not present

## 2024-05-03 DIAGNOSIS — I70222 Atherosclerosis of native arteries of extremities with rest pain, left leg: Secondary | ICD-10-CM | POA: Diagnosis present

## 2024-05-03 DIAGNOSIS — R42 Dizziness and giddiness: Secondary | ICD-10-CM | POA: Diagnosis not present

## 2024-05-03 DIAGNOSIS — Z87892 Personal history of anaphylaxis: Secondary | ICD-10-CM

## 2024-05-03 DIAGNOSIS — K263 Acute duodenal ulcer without hemorrhage or perforation: Secondary | ICD-10-CM | POA: Diagnosis not present

## 2024-05-03 DIAGNOSIS — Y838 Other surgical procedures as the cause of abnormal reaction of the patient, or of later complication, without mention of misadventure at the time of the procedure: Secondary | ICD-10-CM | POA: Diagnosis present

## 2024-05-03 DIAGNOSIS — Z87891 Personal history of nicotine dependence: Secondary | ICD-10-CM

## 2024-05-03 DIAGNOSIS — Z888 Allergy status to other drugs, medicaments and biological substances status: Secondary | ICD-10-CM

## 2024-05-03 DIAGNOSIS — D72829 Elevated white blood cell count, unspecified: Secondary | ICD-10-CM | POA: Diagnosis not present

## 2024-05-03 DIAGNOSIS — K7689 Other specified diseases of liver: Secondary | ICD-10-CM | POA: Diagnosis not present

## 2024-05-03 DIAGNOSIS — Z79899 Other long term (current) drug therapy: Secondary | ICD-10-CM

## 2024-05-03 DIAGNOSIS — T8130XA Disruption of wound, unspecified, initial encounter: Secondary | ICD-10-CM | POA: Diagnosis present

## 2024-05-03 DIAGNOSIS — Z883 Allergy status to other anti-infective agents status: Secondary | ICD-10-CM

## 2024-05-03 DIAGNOSIS — K6389 Other specified diseases of intestine: Secondary | ICD-10-CM | POA: Diagnosis not present

## 2024-05-03 DIAGNOSIS — R609 Edema, unspecified: Secondary | ICD-10-CM | POA: Diagnosis not present

## 2024-05-03 DIAGNOSIS — K921 Melena: Secondary | ICD-10-CM | POA: Diagnosis not present

## 2024-05-03 DIAGNOSIS — J9601 Acute respiratory failure with hypoxia: Secondary | ICD-10-CM | POA: Diagnosis not present

## 2024-05-03 DIAGNOSIS — R918 Other nonspecific abnormal finding of lung field: Secondary | ICD-10-CM | POA: Diagnosis not present

## 2024-05-03 DIAGNOSIS — T8149XA Infection following a procedure, other surgical site, initial encounter: Secondary | ICD-10-CM | POA: Diagnosis not present

## 2024-05-03 DIAGNOSIS — K251 Acute gastric ulcer with perforation: Secondary | ICD-10-CM | POA: Diagnosis not present

## 2024-05-03 DIAGNOSIS — Z86718 Personal history of other venous thrombosis and embolism: Secondary | ICD-10-CM

## 2024-05-03 DIAGNOSIS — Z89512 Acquired absence of left leg below knee: Secondary | ICD-10-CM | POA: Diagnosis not present

## 2024-05-03 DIAGNOSIS — D649 Anemia, unspecified: Secondary | ICD-10-CM | POA: Diagnosis not present

## 2024-05-03 DIAGNOSIS — K922 Gastrointestinal hemorrhage, unspecified: Secondary | ICD-10-CM | POA: Diagnosis not present

## 2024-05-03 DIAGNOSIS — N433 Hydrocele, unspecified: Secondary | ICD-10-CM | POA: Diagnosis not present

## 2024-05-03 DIAGNOSIS — K5730 Diverticulosis of large intestine without perforation or abscess without bleeding: Secondary | ICD-10-CM | POA: Diagnosis present

## 2024-05-03 HISTORY — PX: INCISION AND DRAINAGE OF WOUND: SHX1803

## 2024-05-03 HISTORY — DX: Cerebral infarction, unspecified: I63.9

## 2024-05-03 HISTORY — PX: APPLICATION, SKIN SUBSTITUTE: SHX7530

## 2024-05-03 HISTORY — PX: APPLICATION OF WOUND VAC: SHX5189

## 2024-05-03 LAB — BASIC METABOLIC PANEL WITH GFR
Anion gap: 10 (ref 5–15)
BUN: 23 mg/dL (ref 8–23)
CO2: 23 mmol/L (ref 22–32)
Calcium: 7.8 mg/dL — ABNORMAL LOW (ref 8.9–10.3)
Chloride: 98 mmol/L (ref 98–111)
Creatinine, Ser: 0.71 mg/dL (ref 0.61–1.24)
GFR, Estimated: >60 mL/min (ref >60)
Glucose, Bld: 96 mg/dL (ref 70–99)
Potassium: 3.8 mmol/L (ref 3.5–5.1)
Sodium: 131 mmol/L — ABNORMAL LOW (ref 135–145)

## 2024-05-03 LAB — CBC
HCT: 20.8 % — ABNORMAL LOW (ref 39.0–52.0)
Hemoglobin: 6.2 g/dL — CL (ref 13.0–17.0)
MCH: 29.1 pg (ref 26.0–34.0)
MCHC: 29.8 g/dL — ABNORMAL LOW (ref 30.0–36.0)
MCV: 97.7 fL (ref 80.0–100.0)
Platelets: 359 K/uL (ref 150–400)
RBC: 2.13 MIL/uL — ABNORMAL LOW (ref 4.22–5.81)
RDW: 15.6 % — ABNORMAL HIGH (ref 11.5–15.5)
WBC: 6.7 K/uL (ref 4.0–10.5)
nRBC: 0.9 % — ABNORMAL HIGH (ref 0.0–0.2)

## 2024-05-03 LAB — HEMOGLOBIN AND HEMATOCRIT, BLOOD
HCT: 28.7 % — ABNORMAL LOW (ref 39.0–52.0)
Hemoglobin: 9.1 g/dL — ABNORMAL LOW (ref 13.0–17.0)

## 2024-05-03 LAB — PROTIME-INR
INR: 1.2 (ref 0.8–1.2)
Prothrombin Time: 16.2 s — ABNORMAL HIGH (ref 11.4–15.2)

## 2024-05-03 LAB — IRON AND TIBC
Iron: 21 ug/dL — ABNORMAL LOW (ref 45–182)
Saturation Ratios: 5 % — ABNORMAL LOW (ref 17.9–39.5)
TIBC: 403 ug/dL (ref 250–450)
UIBC: 382 ug/dL

## 2024-05-03 LAB — FOLATE: Folate: 18 ng/mL (ref >5.9)

## 2024-05-03 LAB — PREPARE RBC (CROSSMATCH)

## 2024-05-03 LAB — PHOSPHORUS: Phosphorus: 3.8 mg/dL (ref 2.5–4.6)

## 2024-05-03 LAB — CREATININE, SERUM
Creatinine, Ser: 0.71 mg/dL (ref 0.61–1.24)
GFR, Estimated: >60 mL/min (ref >60)

## 2024-05-03 LAB — VITAMIN B12: Vitamin B-12: 907 pg/mL (ref 180–914)

## 2024-05-03 LAB — MAGNESIUM: Magnesium: 2.2 mg/dL (ref 1.7–2.4)

## 2024-05-03 MED ORDER — FENTANYL CITRATE (PF) 100 MCG/2ML IJ SOLN
INTRAMUSCULAR | Status: AC
  Filled 2024-05-03: qty 2

## 2024-05-03 MED ORDER — ZINC OXIDE 40 % EX OINT
TOPICAL_OINTMENT | Freq: Two times a day (BID) | CUTANEOUS | Status: DC
  Administered 2024-05-09 – 2024-05-17 (×2): 1 via TOPICAL
  Filled 2024-05-03 (×2): qty 57

## 2024-05-03 MED ORDER — OXYCODONE HCL 5 MG PO TABS
5.0000 mg | ORAL_TABLET | ORAL | Status: DC | PRN
  Administered 2024-05-04 – 2024-05-05 (×3): 5 mg via ORAL
  Filled 2024-05-03 (×3): qty 1

## 2024-05-03 MED ORDER — SODIUM CHLORIDE 0.9% FLUSH
3.0000 mL | Freq: Two times a day (BID) | INTRAVENOUS | Status: DC
  Administered 2024-05-04 – 2024-05-18 (×18): 3 mL via INTRAVENOUS

## 2024-05-03 MED ORDER — HYDRALAZINE HCL 20 MG/ML IJ SOLN: 10.0000 mg | INTRAMUSCULAR | Status: DC | PRN

## 2024-05-03 MED ORDER — PHENYLEPHRINE 80 MCG/ML (10ML) SYRINGE FOR IV PUSH (FOR BLOOD PRESSURE SUPPORT)
PREFILLED_SYRINGE | INTRAVENOUS | Status: AC
  Filled 2024-05-03: qty 10

## 2024-05-03 MED ORDER — VASHE WOUND IRRIGATION OPTIME
TOPICAL | Status: DC | PRN
  Administered 2024-05-03: 34 [oz_av]

## 2024-05-03 MED ORDER — ROSUVASTATIN CALCIUM 5 MG PO TABS
10.0000 mg | ORAL_TABLET | Freq: Every day | ORAL | Status: DC
  Administered 2024-05-03 – 2024-05-06 (×4): 10 mg via ORAL
  Filled 2024-05-03 (×4): qty 2

## 2024-05-03 MED ORDER — PANTOPRAZOLE SODIUM 40 MG IV SOLR
40.0000 mg | Freq: Two times a day (BID) | INTRAVENOUS | Status: DC
  Administered 2024-05-03 – 2024-05-05 (×5): 40 mg via INTRAVENOUS
  Filled 2024-05-03 (×5): qty 10

## 2024-05-03 MED ORDER — HEPARIN SODIUM (PORCINE) 5000 UNIT/ML IJ SOLN: 5000.0000 [IU] | Freq: Three times a day (TID) | INTRAMUSCULAR | Status: DC

## 2024-05-03 MED ORDER — FENTANYL CITRATE (PF) 250 MCG/5ML IJ SOLN
INTRAMUSCULAR | Status: DC | PRN
  Administered 2024-05-03 (×2): 50 ug via INTRAVENOUS

## 2024-05-03 MED ORDER — PROPOFOL 10 MG/ML IV BOLUS
INTRAVENOUS | Status: DC | PRN
  Administered 2024-05-03: 110 mg via INTRAVENOUS

## 2024-05-03 MED ORDER — SODIUM CHLORIDE 0.9 % IV SOLN: INTRAVENOUS | Status: DC

## 2024-05-03 MED ORDER — SUCCINYLCHOLINE CHLORIDE 200 MG/10ML IV SOSY
PREFILLED_SYRINGE | INTRAVENOUS | Status: DC | PRN
  Administered 2024-05-03: 120 mg via INTRAVENOUS

## 2024-05-03 MED ORDER — DILTIAZEM HCL ER BEADS 300 MG PO CP24: 300.0000 mg | ORAL_CAPSULE | Freq: Every day | ORAL | Status: DC

## 2024-05-03 MED ORDER — LIDOCAINE 2% (20 MG/ML) 5 ML SYRINGE
INTRAMUSCULAR | Status: AC
  Filled 2024-05-03: qty 5

## 2024-05-03 MED ORDER — ONDANSETRON HCL 4 MG/2ML IJ SOLN
INTRAMUSCULAR | Status: AC
  Filled 2024-05-03: qty 2

## 2024-05-03 MED ORDER — ORAL CARE MOUTH RINSE: 15.0000 mL | Freq: Once | OROMUCOSAL | Status: AC

## 2024-05-03 MED ORDER — TRAZODONE HCL 50 MG PO TABS
25.0000 mg | ORAL_TABLET | Freq: Every evening | ORAL | Status: DC | PRN
  Administered 2024-05-04: 25 mg via ORAL
  Filled 2024-05-03: qty 1

## 2024-05-03 MED ORDER — ACETAMINOPHEN 650 MG RE SUPP: 650.0000 mg | Freq: Four times a day (QID) | RECTAL | Status: DC | PRN

## 2024-05-03 MED ORDER — FENTANYL CITRATE (PF) 100 MCG/2ML IJ SOLN
25.0000 ug | INTRAMUSCULAR | Status: DC | PRN
  Administered 2024-05-03: 25 ug via INTRAVENOUS

## 2024-05-03 MED ORDER — CHLORHEXIDINE GLUCONATE 4 % EX SOLN
60.0000 mL | Freq: Once | CUTANEOUS | Status: AC
  Administered 2024-05-04: 4 via TOPICAL
  Filled 2024-05-03: qty 45

## 2024-05-03 MED ORDER — METOPROLOL SUCCINATE ER 25 MG PO TB24
25.0000 mg | ORAL_TABLET | Freq: Every day | ORAL | Status: DC
  Administered 2024-05-04 – 2024-05-06 (×3): 25 mg via ORAL
  Filled 2024-05-03 (×3): qty 1

## 2024-05-03 MED ORDER — LIDOCAINE 2% (20 MG/ML) 5 ML SYRINGE
INTRAMUSCULAR | Status: DC | PRN
  Administered 2024-05-03: 80 mg via INTRAVENOUS

## 2024-05-03 MED ORDER — ONDANSETRON HCL 4 MG PO TABS: 4.0000 mg | ORAL_TABLET | Freq: Four times a day (QID) | ORAL | Status: DC | PRN

## 2024-05-03 MED ORDER — SODIUM CHLORIDE 0.9% FLUSH
3.0000 mL | Freq: Two times a day (BID) | INTRAVENOUS | Status: DC
  Administered 2024-05-03 – 2024-05-18 (×18): 3 mL via INTRAVENOUS

## 2024-05-03 MED ORDER — CHLORHEXIDINE GLUCONATE 0.12 % MT SOLN
15.0000 mL | Freq: Once | OROMUCOSAL | Status: AC
  Administered 2024-05-03: 15 mL via OROMUCOSAL
  Filled 2024-05-03: qty 15

## 2024-05-03 MED ORDER — ONDANSETRON HCL 4 MG/2ML IJ SOLN
INTRAMUSCULAR | Status: DC | PRN
  Administered 2024-05-03: 4 mg via INTRAVENOUS

## 2024-05-03 MED ORDER — PHENYLEPHRINE 80 MCG/ML (10ML) SYRINGE FOR IV PUSH (FOR BLOOD PRESSURE SUPPORT)
PREFILLED_SYRINGE | INTRAVENOUS | Status: DC | PRN
  Administered 2024-05-03 (×2): 160 ug via INTRAVENOUS
  Administered 2024-05-03: 80 ug via INTRAVENOUS

## 2024-05-03 MED ORDER — PROPOFOL 10 MG/ML IV BOLUS
INTRAVENOUS | Status: AC
  Filled 2024-05-03: qty 20

## 2024-05-03 MED ORDER — DEXAMETHASONE SOD PHOSPHATE PF 10 MG/ML IJ SOLN
INTRAMUSCULAR | Status: DC | PRN
  Administered 2024-05-03: 10 mg via INTRAVENOUS

## 2024-05-03 MED ORDER — SENNOSIDES-DOCUSATE SODIUM 8.6-50 MG PO TABS: 1.0000 | ORAL_TABLET | Freq: Every evening | ORAL | Status: DC | PRN

## 2024-05-03 MED ORDER — FLEET ENEMA RE ENEM: 1.0000 | ENEMA | Freq: Once | RECTAL | Status: DC | PRN

## 2024-05-03 MED ORDER — PHENYLEPHRINE HCL-NACL 20-0.9 MG/250ML-% IV SOLN
INTRAVENOUS | Status: DC | PRN
  Administered 2024-05-03: 50 ug/min via INTRAVENOUS

## 2024-05-03 MED ORDER — SODIUM CHLORIDE 0.9 % IR SOLN
Status: DC | PRN
  Administered 2024-05-03: 3000 mL

## 2024-05-03 MED ORDER — ACETAMINOPHEN 500 MG PO TABS
1000.0000 mg | ORAL_TABLET | Freq: Once | ORAL | Status: AC
  Administered 2024-05-03: 1000 mg via ORAL
  Filled 2024-05-03: qty 2

## 2024-05-03 MED ORDER — SODIUM CHLORIDE 0.9 % IV SOLN: INTRAVENOUS | Status: AC

## 2024-05-03 MED ORDER — ONDANSETRON HCL 4 MG/2ML IJ SOLN: 4.0000 mg | Freq: Four times a day (QID) | INTRAMUSCULAR | Status: DC | PRN

## 2024-05-03 MED ORDER — LACTATED RINGERS IV SOLN: INTRAVENOUS | Status: AC

## 2024-05-03 MED ORDER — CEFAZOLIN SODIUM-DEXTROSE 2-4 GM/100ML-% IV SOLN
2.0000 g | INTRAVENOUS | Status: AC
  Administered 2024-05-03: 2 g via INTRAVENOUS
  Filled 2024-05-03: qty 100

## 2024-05-03 MED ORDER — ACETAMINOPHEN 325 MG PO TABS
650.0000 mg | ORAL_TABLET | Freq: Four times a day (QID) | ORAL | Status: DC | PRN
  Administered 2024-05-06: 650 mg via ORAL
  Filled 2024-05-03: qty 2

## 2024-05-03 MED ORDER — CHLORHEXIDINE GLUCONATE 4 % EX SOLN
60.0000 mL | Freq: Once | CUTANEOUS | Status: AC
  Administered 2024-05-04: 4 via TOPICAL
  Filled 2024-05-03: qty 15

## 2024-05-03 MED ORDER — SODIUM CHLORIDE 0.9% IV SOLUTION: Freq: Once | INTRAVENOUS | Status: DC

## 2024-05-03 MED ORDER — HYDROMORPHONE HCL 1 MG/ML IJ SOLN: 0.5000 mg | INTRAMUSCULAR | Status: DC | PRN

## 2024-05-03 MED ORDER — 0.9 % SODIUM CHLORIDE (POUR BTL) OPTIME
TOPICAL | Status: DC | PRN
  Administered 2024-05-03: 1000 mL

## 2024-05-03 MED ORDER — BISACODYL 5 MG PO TBEC
5.0000 mg | DELAYED_RELEASE_TABLET | Freq: Every day | ORAL | Status: DC | PRN
  Administered 2024-05-06: 5 mg via ORAL
  Filled 2024-05-03: qty 1

## 2024-05-03 MED ORDER — IPRATROPIUM BROMIDE 0.02 % IN SOLN: 0.5000 mg | Freq: Four times a day (QID) | RESPIRATORY_TRACT | Status: DC | PRN

## 2024-05-03 MED ORDER — ONDANSETRON HCL 4 MG/2ML IJ SOLN: 4.0000 mg | Freq: Once | INTRAMUSCULAR | Status: DC | PRN

## 2024-05-03 NOTE — Assessment & Plan Note (Addendum)
-   Rate controlled, 78 currently, - Continue rate control with home medication of diltiazem  and metoprolol  -Eliquis  has been held since 04/29/2024, vascular would like to initiate Plavix  -Continue to hold Eliquis  due to anemia-pending till further evaluation by cardiology and GI

## 2024-05-03 NOTE — H&P (View-Only) (Signed)
 [facility] Consult  Referring Provider: [facility] Primary Care Physician:  Charlott Dorn LABOR, MD Primary Gastroenterologist: Dr.Buccini  Reason for Consultation: Anemia, black stools  HPI: Kyle Kelley is a 78 y.o. male with history of atrial fibrillation, on Eliquis , history of prostate cancer status post prostatectomy and radiation therapy, hypertension, dyslipidemia, aortic stenosis, CVA underwent vascular procedure today(debridement of left below knee postsurgical wound, washout, application of dressing) and was found to have severe anemia with a hemoglobin of 6.2.  Patient states that for the past 3 months he has noticed that stools have been unusually dark, black, usually 1 to 2/day. For the last 3 weeks he has developed worsening exertional shortness of breath although he denies chest pain, dizziness or fatigue. He denies noticing fresh blood or bright red blood in stool. He had lost 15 pounds but states he gained 19 pounds back, denies loss of appetite. Denies nausea, vomiting, acid reflux, heartburn, difficulty swallowing, pain on swallowing. No previous EGD. Denies use of NSAIDs or Goody powders.  Previous GI workup: Cologuard negative 06/2022 Colonoscopy 201, screening, Dr.Johnson: hyperplastic polyps removed from rectum and sigmoid, sigmoid diverticulosis Past Medical History:  Diagnosis Date   Aortic stenosis    Moderate AS with mean AVG 10.8, V-max 2.23m/s, SVI 28, DI 0.3   Carotid artery stenosis    1-39% bilaterally by dopplers 11/2022   Dyslipidemia    Hypertension    Inguinal hernia 2010   Small asymptomatic L inguinal Hernia   Permanent atrial fibrillation (HCC)    atrial fib    Pneumonia    hx of    Prostate cancer (HCC) 07/2012   s/p prostatectomy   PSA elevation    workup per Dr. Alm Fragmin, March 2014   Sigmoid diverticulosis    on colonoscopy in 2010   Skin cancer 02/2012   Dr Renay Homer   Stroke [facility])    Tobacco dependence     rarely smokes-3 cigs per week    Past Surgical History:  Procedure Laterality Date   CARDIOVERSION  08/05/2005   Direct Current Cardioversion   COLONOSCOPY  2010   Direct current Cardioversion  08/05/2005   ENDARTERECTOMY POPLITEAL  03/25/2024   Procedure: ENDARTERECTOMY, LEFT TP TRUNK WITH PATCH ANGIOPLASTY;  Surgeon: Sheree Penne Bruckner, MD;  Location: [facility] OR;  Service: Vascular;;   LOWER EXTREMITY ANGIOGRAM Left 03/25/2024   Procedure: GERALYN, LOWER EXTREMITY;  Surgeon: Sheree Penne Bruckner, MD;  Location: [facility] OR;  Service: Vascular;  Laterality: Left;   PROSTATE BIOPSY  09/06/2012   Dr. Alm Fragmin   ROBOT ASSISTED LAPAROSCOPIC RADICAL PROSTATECTOMY N/A 10/31/2012   Procedure: ROBOTIC ASSISTED LAPAROSCOPIC RADICAL PROSTATECTOMY;  Surgeon: Alm GORMAN Fragmin, MD;  Location: [facility] ORS;  Service: Urology;  Laterality: N/A;   THROMBECTOMY FEMORAL ARTERY Left 03/25/2024   Procedure: THROMBECTOMY, LEFT FEMORAL TO BELOW KNEE ARTERY;  Surgeon: Sheree Penne Bruckner, MD;  Location: [facility] OR;  Service: Vascular;  Laterality: Left;   TONSILLECTOMY AND ADENOIDECTOMY     TONSILLECTOMY AND ADENOIDECTOMY     VEIN HARVEST Left 03/25/2024   Procedure: SURGICAL PROCUREMENT, LEFT LOWER SAPHENOUS VEIN;  Surgeon: Sheree Penne Bruckner, MD;  Location: [facility] OR;  Service: Vascular;  Laterality: Left;    Prior to Admission medications   Medication Sig Start Date End Date Taking? Authorizing Provider  diltiazem  (TIAZAC ) 300 MG 24 hr capsule Take 300 mg by mouth daily.   Yes [provider]  docusate sodium  (COLACE) 100 MG capsule Take 100 mg by mouth  daily as needed for mild constipation.   Yes [provider]  EPINEPHrine  (EPIPEN  IJ) Inject 1 application  as directed as needed (anaphylaxis).   Yes [provider]  metoprolol  succinate (TOPROL -XL) 25 MG 24 hr tablet Take 1 tablet (25 mg total) by mouth daily. 12/25/23  Yes Turner, Wilbert SAUNDERS, MD  rosuvastatin  (CRESTOR ) 10 MG  tablet Take 1 tablet (10 mg total) by mouth daily. 12/25/23  Yes Turner, Wilbert SAUNDERS, MD  apixaban  (ELIQUIS ) 5 MG TABS tablet Take 5 mg by mouth 2 (two) times daily. Patient not taking: Reported on 04/29/2024    [provider]  polyethylene glycol powder (GLYCOLAX /MIRALAX ) 17 GM/SCOOP powder Take 17 g by mouth daily as needed for mild constipation. Dissolve 1 capful (17g) in 4-8 ounces of liquid and take by mouth daily. Patient not taking: Reported on 04/29/2024 03/28/24   Christobal Guadalajara, MD  sulfamethoxazole -trimethoprim  (BACTRIM  DS) 800-160 MG tablet Take 1 tablet by mouth 2 (two) times daily for 14 days. Patient not taking: Reported on 04/29/2024 04/15/24   Serene Gaile ORN, MD    Current Facility-Administered Medications  Medication Dose Route Frequency Provider Last Rate Last Admin   0.9 %  sodium chloride  infusion (Manually program via Guardrails IV Fluids)   Intravenous Once Corinne Garnette BRAVO, MD       0.9 %  sodium chloride  infusion   Intravenous Continuous Sheree Penne Bruckner, MD   New Bag at 05/03/24 1134   0.9 %  sodium chloride  infusion   Intravenous Continuous Shahmehdi, Seyed A, MD       acetaminophen  (TYLENOL ) tablet 650 mg  650 mg Oral Q6H PRN Shahmehdi, Adriana LABOR, MD       Or   acetaminophen  (TYLENOL ) suppository 650 mg  650 mg Rectal Q6H PRN Shahmehdi, Seyed A, MD       bisacodyl  (DULCOLAX) EC tablet 5 mg  5 mg Oral Daily PRN Shahmehdi, Seyed A, MD       chlorhexidine  (HIBICLENS ) 4 % liquid 4 Application  60 mL Topical Once Sheree Penne Bruckner, MD       And   [START ON 05/04/2024] chlorhexidine  (HIBICLENS ) 4 % liquid 4 Application  60 mL Topical Once Sheree Penne Bruckner, MD       [START ON 05/04/2024] diltiazem  (TIAZAC ) 24 hr capsule 300 mg  300 mg Oral Daily Shahmehdi, Seyed A, MD       fentaNYL  (SUBLIMAZE ) injection 25-50 mcg  25-50 mcg Intravenous Q5 min PRN Corinne Garnette BRAVO, MD   25 mcg at 05/03/24 1351   HYDROmorphone  (DILAUDID ) injection 0.5-1 mg  0.5-1 mg  Intravenous Q2H PRN Shahmehdi, Seyed A, MD       ipratropium (ATROVENT ) nebulizer solution 0.5 mg  0.5 mg Nebulization Q6H PRN Shahmehdi, Seyed A, MD       lactated ringers  infusion   Intravenous Continuous Corinne Garnette BRAVO, MD 10 mL/hr at 05/03/24 1134 Restarted at 05/03/24 1301   [START ON 05/04/2024] metoprolol  succinate (TOPROL -XL) 24 hr tablet 25 mg  25 mg Oral Daily Shahmehdi, Seyed A, MD       ondansetron  (ZOFRAN ) injection 4 mg  4 mg Intravenous Once PRN Corinne Garnette BRAVO, MD       ondansetron  (ZOFRAN ) tablet 4 mg  4 mg Oral Q6H PRN Shahmehdi, Seyed A, MD       Or   ondansetron  (ZOFRAN ) injection 4 mg  4 mg Intravenous Q6H PRN Shahmehdi, Adriana LABOR, MD       oxyCODONE  (Oxy IR/ROXICODONE ) immediate  release tablet 5 mg  5 mg Oral Q4H PRN Shahmehdi, Seyed A, MD       rosuvastatin  (CRESTOR ) tablet 10 mg  10 mg Oral Daily Shahmehdi, Seyed A, MD       senna-docusate (Senokot-S) tablet 1 tablet  1 tablet Oral QHS PRN Shahmehdi, Seyed A, MD       sodium chloride  flush (NS) 0.9 % injection 3 mL  3 mL Intravenous Q12H Shahmehdi, Seyed A, MD       sodium chloride  flush (NS) 0.9 % injection 3 mL  3 mL Intravenous Q12H Shahmehdi, Seyed A, MD       sodium phosphate (FLEET) enema 1 enema  1 enema Rectal Once PRN Shahmehdi, Seyed A, MD       traZODone  (DESYREL ) tablet 25 mg  25 mg Oral QHS PRN Willette Adriana LABOR, MD        Allergies as of 04/29/2024 - Review Complete 04/29/2024  Allergen Reaction Noted   Ace inhibitors Anaphylaxis 05/08/2013   Bactrim  ds [sulfamethoxazole -trimethoprim ] Shortness Of Breath 04/29/2024   Rythmol [propafenone] Shortness Of Breath 05/08/2013   Nitrofurantoin Rash 05/08/2013    Family History  Problem Relation Age of Onset   Hypertension Mother    Heart disease Father    Hyperlipidemia Father    Cancer Father        prostate    Social History   Socioeconomic History   Marital status: Married    Spouse name: Not on file   Number of children: Not on file   Years of  education: Not on file   Highest education level: Not on file  Occupational History   Not on file  Tobacco Use   Smoking status: Former    Current packs/day: 0.50    Average packs/day: 0.5 packs/day for 50.0 years (25.0 ttl pk-yrs)    Types: Cigarettes   Smokeless tobacco: Never  Vaping Use   Vaping status: Never Used  Substance and Sexual Activity   Alcohol use: Yes    Comment: rare, heavier in the past   Drug use: No   Sexual activity: Never  Other Topics Concern   Not on file  Social History Narrative   Tobacco Use cigarettes: Current Smoker   Smoking: Yes, less than 1/2 pk per month   Alcohol: Yes - No Alcohol since June 2005   Caffeine: yes   No recreational drug use   Occupation: Employed   Marital Status: Married 38 years, wife has MS   Children: none   Social Drivers of Corporate Investment Banker Strain: Not on file  Food Insecurity: No Food Insecurity (03/26/2024)   Hunger Vital Sign    Worried About Running Out of Food in the Last Year: Never true    Ran Out of Food in the Last Year: Never true  Transportation Needs: No Transportation Needs (03/26/2024)   PRAPARE - Administrator, Civil Service (Medical): No    Lack of Transportation (Non-Medical): No  Physical Activity: Not on file  Stress: Not on file  Social Connections: Moderately Integrated (03/26/2024)   Social Connection and Isolation Panel    Frequency of Communication with Friends and Family: Twice a week    Frequency of Social Gatherings with Friends and Family: Once a week    Attends Religious Services: 1 to 4 times per year    Active Member of [employer] or Organizations: No    Attends Banker Meetings: Never    Marital Status: Married  Intimate Partner Violence: Not At Risk (03/26/2024)   Humiliation, Afraid, Rape, and Kick questionnaire    Fear of Current or Ex-Partner: No    Emotionally Abused: No    Physically Abused: No    Sexually Abused: No    Review of  Systems: As per HPI left.  Physical Exam: Vital signs in last 24 hours: Temp:  [97.7 F (36.5 C)-98 F (36.7 C)] 98 F (36.7 C) (12/05 1252) Pulse Rate:  [54-78] 58 (12/05 1345) Resp:  [11-19] 12 (12/05 1345) BP: (97-126)/(53-75) 97/55 (12/05 1345) SpO2:  [100 %] 100 % (12/05 1345) Weight:  [77.6 kg] 77.6 kg (12/05 0850)    General:   Alert,  Well-developed, well-nourished, pleasant and cooperative in NAD Head:  Normocephalic and atraumatic. Eyes:  Sclera clear, no icterus.  Prominent pallor  Ears:  Normal auditory acuity. Nose:  No deformity, discharge,  or lesions. Mouth:  No deformity or lesions.  Oropharynx pink & moist. Neck:  Supple; no masses or thyromegaly. Lungs:  Clear throughout to auscultation.   No wheezes, crackles, or rhonchi. No acute distress. Heart:  Regular rate and rhythm; no murmurs, clicks, rubs,  or gallops. Extremities: Left lower extremity dressing and wound VAC in place Neurologic:  Alert and  oriented x4;  grossly normal neurologically. Skin:  Intact without significant lesions or rashes. Psych:  Alert and cooperative. Normal mood and affect. Abdomen:  Soft, nontender and nondistended. No masses, hepatosplenomegaly or hernias noted. Normal bowel sounds, without guarding, and without rebound.         Lab Results: Recent Labs    05/03/24 0933  WBC 6.7  HGB 6.2*  HCT 20.8*  PLT 359   BMET Recent Labs    05/03/24 0933  NA 131*  K 3.8  CL 98  CO2 23  GLUCOSE 96  BUN 23  CREATININE 0.71  CALCIUM  7.8*   LFT No results for input(s): PROT, ALBUMIN , AST, ALT, ALKPHOS, BILITOT, BILIDIR, IBILI in the last 72 hours. PT/INR No results for input(s): LABPROT, INR in the last 72 hours.  Studies/Results: No results found.  Impression: Significant symptomatic anemia,Hemoglobin 6.2, baseline hemoglobin around 11 History of black stools for 3 months Mild hypotension, 105/59 mmHg  Was on Eliquis , as per patient last dose of  Eliquis  was yesterday, as per documentation from admitting hospitalist, last dose of Eliquis  was 04/29/2024  Comorbidities: Ischemia of left lower extremity, status post left lower extremity thrombectomy and angioplasty on 05/26/2024, left SFA stent placement, wound dehiscence, debridement and placement of wound VAC on 05/03/2024  History of stroke, atrial fibrillation, aortic stenosis, EF 55 to 60%, hypertension  Plan: Diagnostic EGD in a.m. with Dr. Elicia. Will start patient on clear liquid diet and keep n.p.o. postmidnight. I have started patient on pantoprazole  40 mg IV twice daily. 2 unit PRBC transfusion has been given.   LOS: 0 days   Estelita Manas, MD  05/03/2024, 2:39 PM

## 2024-05-03 NOTE — TOC Progression Note (Signed)
 Transition of Care (TOC) - Progression Note  Rayfield Gobble RN, BSN Inpatient Care Management Unit 4E- RN Case Manager See Treatment Team for direct phone #   Patient Details  Name: Kyle Kelley MRN: [id number] Date of Birth: 11/04/1945  Transition of Care [facility]) CM/SW Contact  Gobble, Rayfield Hurst, RN Phone Number: 05/03/2024, 1:31 PM  Clinical Narrative:    Note this pt will be admitted and not returning home post procedure as planned.   CM following for transition needs- home wound VAC has been approved per [facility]- CM will deliver to bedside when pt medically stable for transition home (POD has been sent to this CM).   Pt is active with [facility] for [facility]- liaison has been updated that pt will remain here over weekend- visit planned for Monday will not be needed- will tentatively plan for [facility] visit on Wednesday for [facility] needs pending timing of pt's discharge. - will need new order for [facility]  IP CM will follow for HH/home VAC needs   Expected Discharge Plan: Home w Home Health Services                 Expected Discharge Plan and Services   Discharge Planning Services: CM Consult Post Acute Care Choice: Home Health, Resumption of Svcs/PTA Provider, Durable Medical Equipment Living arrangements for the past 2 months: Single Family Home                 DME Arranged: Vac DME Agency: [facility] Date DME Agency Contacted: 04/29/24   Representative spoke with at DME Agency: [facility] HH Arranged: RN HH Agency: [facility] Date HH Agency Contacted: 04/30/24 Time HH Agency Contacted: 1350 Representative spoke with at [facility] Agency: Research Scientist (physical Sciences)   Social Drivers of Health (SDOH) Interventions SDOH Screenings   Food Insecurity: No Food Insecurity (03/26/2024)  Housing: Low Risk  (03/26/2024)  Transportation Needs: No Transportation Needs (03/26/2024)  Utilities: Not At Risk (03/26/2024)  Social Connections: Moderately Integrated (03/26/2024)  Tobacco Use: Medium Risk  (05/03/2024)    Readmission Risk Interventions     No data to display

## 2024-05-03 NOTE — Assessment & Plan Note (Signed)
 Remote history of intracranial hemorrhage -Due to A-fib patient has been anticoagulated on Eliquis , which has been held since 04/29/2024

## 2024-05-03 NOTE — Hospital Course (Addendum)
 Kyle Kelley is a 78 year old male with extensive history of atrial fibrillation (on Eliquis /recently held), vasculopath, with acute on chronic ischemic left lower extremity nonhealing wound, HTN, HLD, Aortic stenosis, CVA, prostate cancer s/p prostatectomy 2014,.. Presented today to the vascular team for follow-up left lower extremity wound.    It appears the patient presented to ED on 03/25/2024 for acute left limb ischemia, vascular was consulted underwent a left lower extremity thrombectomy, and angioplasty.,  Left SFA stent placement on 05/26/2024 by Dr. Sheree.[name]  He has been followed closely by vascular team on outpatient for infection, hematoma of left lower extremity.  Per vascular surgery on 04/29/2024 follow-up visit was noted worsening wound dehiscence-patient was brought back today 05/03/2024 for debridement in OR and placement of wound VAC.  Patient Eliquis  has been held since 04/29/2024  Today patient was hemodynamically stable: With blood pressure 126/75, pulse of 78 satting 100% .  But labs revealed: hemoglobin of 6.2, last hemoglobin in  medical record in October was 11.3.  Vascular team requested patient to be admitted for further evaluation -blood transfusion, consultation from cardiology regarding withholding anticoagulation and GI.

## 2024-05-03 NOTE — Assessment & Plan Note (Signed)
-   No new focal neurological findings -Holding Eliquis , continue statins (Vascular like to add Plavix -once hemoglobin stabilized)

## 2024-05-03 NOTE — Assessment & Plan Note (Addendum)
-   Last echo 03/18/2024-reviewed EF 55-60%, normal LV function, mildly enlarged right ventricle, mildly reduced RV function, aortic valve was not well visualized no significant stenosis was reported -Currently asymptomatic, stable

## 2024-05-03 NOTE — Consult Note (Signed)
 Cardiology Consultation   Patient ID: Kyle Kelley MRN: [id number]; DOB: 07-17-1945  Admit date: 05/03/2024 Date of Consult: 05/03/2024  PCP:  Charlott Dorn LABOR, MD   [facility] Providers Cardiologist:  Wilbert Bihari, MD        Patient Profile: Kyle Kelley is a 78 y.o. male with a history of permanent atrial fibrillation on Eliquis , aortic stenosis, PAD with an acute ischemic leg in 02/2024 (s/p thromboembolectomy, left tibioperoneal trunk endarterectomy with vein patch angioplasty, and stenting of left SFA), ICH in 02/2024, hypertension, hyperlipidemia, cancer s/p prostatectomy, and tobacco abuse who is being seen 05/03/2024 for assistance with anticoagulation recommendations at the request of Dr. Sheree.  History of Present Illness: Kyle Kelley is a 78 year old male with the above history who is followed by Dr. Bihari.  He is primarily followed by Cardiology for atrial fibrillation which has been considered chronic/ permanent since at least 2014. He was admitted from 02/27/2024 to 02/29/2024 of ICH after presenting with imbalance and dizziness. Neurology was consulted and Eliquis  was held. He did not require surgery. He was seen by Neurology as an outpatient on 03/25/2024 and given the okay to restart Eliquis .  On his way home from this appointment, he developed severe left leg pain and presented to the ED where he was found to have an acute ischemic leg.  He was taken to the OR by Dr. Gari and underwent left lower extremity thromboembolectomy via common femoral and below-knee popliteal artery exposure, left tibioperoneal trunk endarterectomy with vein patch angioplasty, and stenting of left SFA.  Echo during admission showed LVEF of 55-60% with normal wall motion, mildly reduced RV function, and no significant valvular disease. He was discharged on Plavix  and Eliquis .  He unfortunately developed an infected hematoma on the lower aspect of his distal incision. He was seen by Vascular  Surgery on 04/29/2024 and it was recommended that he go back to the OR for washout and likely VAC placement.  Eliquis  was held in anticipation for this.  Scented to [facility] today and underwent debridement of the wound and wound VAC was placed.  Morning labs showed hemoglobin of 6.2; therefore, he underwent blood transfusion prior to procedure.  He was admitted following the procedure and Cardiology asked to see to help provide recommendations for anticoagulation.  Patient states he has been doing well from a cardiac standpoint. He reports he has chronic dyspnea when he over-exerts himself. He has noticed this has been worse and he has been getting more easily winded over the last 2 weeks which is likely due to his acute anemia. No orthopnea, PND, or significant edema. He denies any chest pain, palpitations, recent lightheadedness/ dizziness, or syncope. He reports noticing dark stools since starting Plavix  in addition to Eliquis  after recent vascular procedure. He denies any other abnormal bleeding.   Past Medical History:  Diagnosis Date   Aortic stenosis    Moderate AS with mean AVG 10.8, V-max 2.59m/s, SVI 28, DI 0.3   Carotid artery stenosis    1-39% bilaterally by dopplers 11/2022   Dyslipidemia    Hypertension    Inguinal hernia 2010   Small asymptomatic L inguinal Hernia   Permanent atrial fibrillation (HCC)    atrial fib    Pneumonia    hx of    Prostate cancer (HCC) 07/2012   s/p prostatectomy   PSA elevation    workup per Dr. Alm Fragmin, March 2014   Sigmoid diverticulosis    on colonoscopy in 2010  Skin cancer 02/2012   Dr Renay Homer   [facility])    Tobacco dependence    rarely smokes-3 cigs per week    Past Surgical History:  Procedure Laterality Date   CARDIOVERSION  08/05/2005   Direct Current Cardioversion   COLONOSCOPY  2010   Direct current Cardioversion  08/05/2005   ENDARTERECTOMY POPLITEAL  03/25/2024   Procedure: ENDARTERECTOMY, LEFT TP TRUNK WITH PATCH  ANGIOPLASTY;  Surgeon: Sheree Penne Bruckner, MD;  Location: [facility] OR;  Service: Vascular;;   LOWER EXTREMITY ANGIOGRAM Left 03/25/2024   Procedure: GERALYN, LOWER EXTREMITY;  Surgeon: Sheree Penne Bruckner, MD;  Location: [facility] OR;  Service: Vascular;  Laterality: Left;   PROSTATE BIOPSY  09/06/2012   Dr. Alm Fragmin   ROBOT ASSISTED LAPAROSCOPIC RADICAL PROSTATECTOMY N/A 10/31/2012   Procedure: ROBOTIC ASSISTED LAPAROSCOPIC RADICAL PROSTATECTOMY;  Surgeon: Alm GORMAN Fragmin, MD;  Location: [facility] ORS;  Service: Urology;  Laterality: N/A;   THROMBECTOMY FEMORAL ARTERY Left 03/25/2024   Procedure: THROMBECTOMY, LEFT FEMORAL TO BELOW KNEE ARTERY;  Surgeon: Sheree Penne Bruckner, MD;  Location: [facility] OR;  Service: Vascular;  Laterality: Left;   TONSILLECTOMY AND ADENOIDECTOMY     TONSILLECTOMY AND ADENOIDECTOMY     VEIN HARVEST Left 03/25/2024   Procedure: SURGICAL PROCUREMENT, LEFT LOWER SAPHENOUS VEIN;  Surgeon: Sheree Penne Bruckner, MD;  Location: [facility] OR;  Service: Vascular;  Laterality: Left;       Scheduled Meds:  sodium chloride    Intravenous Once   chlorhexidine   60 mL Topical Once   And   [START ON 05/04/2024] chlorhexidine   60 mL Topical Once   [START ON 05/04/2024] diltiazem   300 mg Oral Daily   [START ON 05/04/2024] metoprolol  succinate  25 mg Oral Daily   rosuvastatin   10 mg Oral Daily   sodium chloride  flush  3 mL Intravenous Q12H   sodium chloride  flush  3 mL Intravenous Q12H   Continuous Infusions:  sodium chloride      sodium chloride      lactated ringers  10 mL/hr at 05/03/24 1134   PRN Meds: acetaminophen  **OR** acetaminophen , bisacodyl , fentaNYL  (SUBLIMAZE ) injection, HYDROmorphone  (DILAUDID ) injection, ipratropium, ondansetron  (ZOFRAN ) IV, ondansetron  **OR** ondansetron  (ZOFRAN ) IV, oxyCODONE , senna-docusate, sodium phosphate, traZODone   Allergies:    Allergies  Allergen Reactions   Ace Inhibitors Anaphylaxis   Bactrim  Ds [Sulfamethoxazole -Trimethoprim ] Shortness  Of Breath   Rythmol [Propafenone] Shortness Of Breath   Nitrofurantoin Rash    Social History:   Social History   Socioeconomic History   Marital status: Married    Spouse name: Not on file   Number of children: Not on file   Years of education: Not on file   Highest education level: Not on file  Occupational History   Not on file  Tobacco Use   Smoking status: Former    Current packs/day: 0.50    Average packs/day: 0.5 packs/day for 50.0 years (25.0 ttl pk-yrs)    Types: Cigarettes   Smokeless tobacco: Never  Vaping Use   Vaping status: Never Used  Substance and Sexual Activity   Alcohol use: Yes    Comment: rare, heavier in the past   Drug use: No   Sexual activity: Never  Other Topics Concern   Not on file  Social History Narrative   Tobacco Use cigarettes: Current Smoker   Smoking: Yes, less than 1/2 pk per month   Alcohol: Yes - No Alcohol since June 2005   Caffeine: yes   No recreational drug use   Occupation: Employed  Marital Status: Married 38 years, wife has MS   Children: none   Social Drivers of Corporate Investment Banker Strain: Not on file  Food Insecurity: No Food Insecurity (03/26/2024)   Hunger Vital Sign    Worried About Running Out of Food in the Last Year: Never true    Ran Out of Food in the Last Year: Never true  Transportation Needs: No Transportation Needs (03/26/2024)   PRAPARE - Administrator, Civil Service (Medical): No    Lack of Transportation (Non-Medical): No  Physical Activity: Not on file  Stress: Not on file  Social Connections: Moderately Integrated (03/26/2024)   Social Connection and Isolation Panel    Frequency of Communication with Friends and Family: Twice a week    Frequency of Social Gatherings with Friends and Family: Once a week    Attends Religious Services: 1 to 4 times per year    Active Member of [employer] or Organizations: No    Attends Banker Meetings: Never    Marital Status:  Married  Catering Manager Violence: Not At Risk (03/26/2024)   Humiliation, Afraid, Rape, and Kick questionnaire    Fear of Current or Ex-Partner: No    Emotionally Abused: No    Physically Abused: No    Sexually Abused: No    Family History:   Family History  Problem Relation Age of Onset   Hypertension Mother    Heart disease Father    Hyperlipidemia Father    Cancer Father        prostate     ROS:  Please see the history of present illness.   Physical Exam/Data: Vitals:   05/03/24 1315 05/03/24 1330 05/03/24 1345 05/03/24 1500  BP: (!) 107/55 (!) 97/57 (!) 97/55 (!) 112/57  Pulse: (!) 58 (!) 54 (!) 58 (!) 59  Resp: 11 11 12 12   Temp:    97.9 F (36.6 C)  TempSrc:    Oral  SpO2: 100% 100% 100% 96%  Weight:      Height:        Intake/Output Summary (Last 24 hours) at 05/03/2024 1506 Last data filed at 05/03/2024 1301 Gross per 24 hour  Intake 1430 ml  Output 25 ml  Net 1405 ml      05/03/2024    8:50 AM 05/01/2024    5:13 PM 04/29/2024   12:55 PM  Last 3 Weights  Weight (lbs) 171 lb 171 lb 1.2 oz 171 lb 1.6 oz  Weight (kg) 77.565 kg 77.6 kg 77.61 kg     Body mass index is 22.56 kg/m.  General: 78 y.o. thin Caucasian male resting comfortably in no acute distress. HEENT: Normocephalic and atraumatic. Sclera clear. EOMs intact. Neck: Supple. No JVD. Heart: Mild bradycardia with irregularly irregular rhythm. No murmurs, gallops, or rubs.  Lungs: No increased work of breathing. Clear to ausculation anteriorly.  Extremities: Mild 1+ pitting edema of right lower extremity edema. Left leg wrapped with wound VAC in place.  Skin: Warm and dry. Neuro: Alert and oriented x3. No focal deficits. Psych: Normal affect. Responds appropriately.  EKG:  The EKG was personally reviewed and demonstrates:  No new ECG tracing this admission. Telemetry:  Telemetry was personally reviewed and demonstrates:  Atrial fibrillation with rates in the 50s.  Relevant CV  Studies:  Echocardiogram 03/26/2024: Impressions: 1. Left ventricular ejection fraction, by estimation, is 55 to 60%. The  left ventricle has normal function. The left ventricle has no regional  wall  motion abnormalities. Left ventricular diastolic parameters are  indeterminate.   2. Right ventricular systolic function is mildly reduced. The right  ventricular size is mildly enlarged.   3. The mitral valve is grossly normal. No evidence of mitral valve  regurgitation.   4. The aortic valve was not well visualized. Aortic valve regurgitation  is not visualized. Aortic valve sclerosis is present, with no evidence of  aortic valve stenosis.   5. The inferior vena cava is normal in size with greater than 50%  respiratory variability, suggesting right atrial pressure of 3 mmHg.    Laboratory Data: High Sensitivity Troponin:  No results for input(s): TROPONINIHS in the last 720 hours.   Chemistry Recent Labs  Lab 05/03/24 0933  NA 131*  K 3.8  CL 98  CO2 23  GLUCOSE 96  BUN 23  CREATININE 0.71  CALCIUM  7.8*  GFRNONAA >60  ANIONGAP 10    No results for input(s): PROT, ALBUMIN , AST, ALT, ALKPHOS, BILITOT in the last 168 hours. Lipids No results for input(s): CHOL, TRIG, HDL, LABVLDL, LDLCALC, CHOLHDL in the last 168 hours.  Hematology Recent Labs  Lab 05/03/24 0933  WBC 6.7  RBC 2.13*  HGB 6.2*  HCT 20.8*  MCV 97.7  MCH 29.1  MCHC 29.8*  RDW 15.6*  PLT 359   Thyroid  No results for input(s): TSH, FREET4 in the last 168 hours.  BNPNo results for input(s): BNP, PROBNP in the last 168 hours.  DDimer No results for input(s): DDIMER in the last 168 hours.  Radiology/Studies:  No results found.   Assessment and Plan:  Permament Atrial Fibrillation Patient has a long history of atrial fibrillation dating back to at least 2014.  - Rates currently in the 50s. Normally in the 80s to 90s at office visits.  - On Diltiazem  300mg  daily and  Toprol -XL 25mg  daily. Will continue home Toprol -XL but will hold Diltiazem  for now given bradycardia. May be able to restart tomorrow. - On chronic anticoagulation with Eliquis  at home but this is currently on hold in setting of acute anemia with GI bleed. This is a complicated situated. He had a ICH in 02/2024 and then an ischemic leg later that month after Eliquis  was held. He now has an GI bleed after being restarted on Eliquis  as well as Plavix . For now, will continue to hold Eliquis  and can restart anticoagulation (may want to start with IV Heparin ) when okay with GI. Long-term, he may be a good Watchman candidate. CHA2DS-VASc score = 6 indicating a 15.5% annual risk of stroke. HAS-BLED score = 4 indicating a 8.9%  annual risk of a major bleed.  Aortic Stenosis Echo in 11/2023 showed mild to moderate aortic stenosis vs paradoxical low flow low gradient moderate aortic stenosis (mean gradient 10.8 mmHg and DI 0.3). However, repeat Echo in 02/2024 showed no evidence of aortic stenosis.   PAD Admitted with acute ischemic leg in 02/2024 after Eliquis  was held for ICH. He underwent  thromboembolectomy, left tibioperoneal trunk endarterectomy with vein patch angioplasty, and stenting of left SFA. He presented back to the OR today for debridement and wound VAC placement.  - Patient was previously on Plavix . This is currently on hold in setting of acute GI bleed. Will defer this to Vascular Surgery. - Continue Crestor  10mg  daily. - Management per Vascular Surgery.  Acute Anemia Upper GI Bleed Hemoglobin 6.2 today. He reports several weeks of dark stools after starting Plavix  in addition to Eliquis .  - Currently receiving 2 units  of PRBCs.  - GI has been consulted.     Risk Assessment/Risk Scores:   CHA2DS2-VASc Score = 6  CHF History: 0 HTN History: 1 Diabetes History: 0 Stroke History: 2 Vascular Disease History: 1 Age Score: 2 Gender Score: 0     For questions or updates, please  contact [facility] Please consult [email or web address] for contact info under      Signed, Shatonya Passon E Quana Chamberlain, PA-C  05/03/2024 3:06 PM

## 2024-05-03 NOTE — Assessment & Plan Note (Signed)
 Continue statins

## 2024-05-03 NOTE — Consult Note (Signed)
 WOC Nurse Consult Note: Reason for Consult: sacral wound  Wound type: Stage 2 Pressure Injury sacrum erythema with small scattered areas of red dry tissue  Pressure Injury POA: Yes Measurement: see nursing flowsheet  Wound bed: as above  Drainage (amount, consistency, odor) appears dry  Periwound: erythema  Dressing procedure/placement/frequency: Cleanse sacrum/buttocks with soap and water , dry and apply a thin layer of Desitin 2 times a day and prn soiling. Cover sacrum with silicone foam, change q3 days and prn soiling.   WOC team will not follow. Re-consult if further needs arise.   Thank you,    Powell Bar MSN, RN-BC, [employer]

## 2024-05-03 NOTE — TOC Initial Note (Signed)
 Transition of Care (TOC) - Initial/Assessment Note  Rayfield Gobble RN, BSN Inpatient Care Management Unit 4E- RN Case Manager See Treatment Team for direct phone #   Patient Details  Name: Kyle Kelley MRN: [id number] Date of Birth: Mar 01, 1946  Transition of Care [facility]) CM/SW Contact:    Gobble Rayfield Hurst, RN Phone Number: 05/03/2024, 10:58 AM  Clinical Narrative:                 Contacted by VVS office regarding pt's upcoming procedure this Friday Dec. 5- plan is for I&D and wound VAC placement. VVS office would like to have home VAC arranged for pt to return home after procedure.   Contacted [employer] liaison for home VAC needs- e-script to be sent to C. Baglia at [facility]VS office for signature and to start auth.   12/2- call made to pt and wife also on speaker- to discuss upcoming procedure and home needs following procedure. Pt and wife voiced that they are aware pt will have wound VAC and understand the CM is working on insurance auth to have [facility] ready on Friday. Pt voiced he is already active with [facility] for [facility]- visits are 3x week.  Contacted [facility] liaison to confirm that they can continue to staff pt for home [facility] needs with nursing- [facility] will continue to see pt post procedure Friday and will plan to see pt for first home VAC drsg change on Monday- Dec 8.   12/4- CM has been notified from [employer] liaison that home [facility] has been approved- liaison will send POD for [facility] delivery in the am.   CM will follow up Friday when pt arrives for procedure and deliver home [facility] prior to release from PACU  Expected Discharge Plan: Home w Home Health Services     Patient Goals and CMS Choice Patient states their goals for this hospitalization and ongoing recovery are:: return home   Choice offered to / list presented to : Patient, Spouse      Expected Discharge Plan and Services   Discharge Planning Services: CM Consult Post Acute Care Choice: Home Health, Resumption of Svcs/PTA  Provider, Durable Medical Equipment Living arrangements for the past 2 months: Single Family Home                 DME Arranged: Vac DME Agency: [facility] Date DME Agency Contacted: 04/29/24   Representative spoke with at DME Agency: Randine HH Arranged: RN HH Agency: [facility] Date HH Agency Contacted: 04/30/24 Time HH Agency Contacted: 1350 Representative spoke with at [facility] Agency: Powell  Prior Living Arrangements/Services Living arrangements for the past 2 months: Single Family Home Lives with:: Spouse, Self Patient language and need for interpreter reviewed:: Yes Do you feel safe going back to the place where you live?: Yes      Need for Family Participation in Patient Care: Yes (Comment) Care giver support system in place?: Yes (comment) Current home services: DME Criminal Activity/Legal Involvement Pertinent to Current Situation/Hospitalization: No - Comment as needed  Activities of Daily Living   ADL Screening (condition at time of admission) Independently performs ADLs?: Yes (appropriate for developmental age) Is the patient deaf or have difficulty hearing?: Yes Does the patient have difficulty seeing, even when wearing glasses/contacts?: No Does the patient have difficulty concentrating, remembering, or making decisions?: No  Permission Sought/Granted Permission sought to share information with : Facility Industrial/product Designer granted to share information with : Yes, Verbal Permission Granted     Permission  granted to share info w AGENCY: DME/HH        Emotional Assessment   Attitude/Demeanor/Rapport: Engaged Affect (typically observed): Accepting Orientation: : Oriented to Self, Oriented to Place, Oriented to  Time, Oriented to Situation Alcohol / Substance Use: Not Applicable Psych Involvement: No (comment)  Admission diagnosis:  Surgical wound infection [T81.49XA] Patient Active Problem List   Diagnosis Date Noted   Critical  limb ischemia of left lower extremity (HCC) 03/26/2024   Critical lower limb ischemia (HCC) 03/26/2024   Allergic to bees 03/25/2024   History of stroke without residual deficits 03/25/2024   Mixed hyperlipidemia 03/25/2024   Status post surgery 03/25/2024   ICH (intracerebral hemorrhage) (HCC) 02/28/2024   Aortic stenosis    Carotid artery stenosis    Dizziness 09/02/2020   Tobacco abuse 09/02/2020   Parotid mass 09/02/2020   Atrial fibrillation (HCC)    Malignant neoplasm of prostate (HCC) 09/05/2016   Essential hypertension, benign 05/08/2013   Pure hypercholesterolemia 05/08/2013   Permanent atrial fibrillation (HCC) 03/05/2013   PCP:  Charlott Dorn LABOR, MD Pharmacy:   [facility] [phone] - [place], [place] - [address] AT [facility] [address] [place] [address] Phone: [phone] Fax: [phone]  [name] [address] [place] [address] Phone: [phone] Fax: [phone]  [place] [facility] [address] [place] [address] Phone: [phone] Fax: [phone]     Social Drivers of Health (SDOH) Social History: SDOH Screenings   Food Insecurity: No Food Insecurity (03/26/2024)  Housing: Low Risk  (03/26/2024)  Transportation Needs: No Transportation Needs (03/26/2024)  Utilities: Not At Risk (03/26/2024)  Social Connections: Moderately Integrated (03/26/2024)  Tobacco Use: Medium Risk (05/03/2024)   SDOH Interventions:     Readmission Risk Interventions     No data to display

## 2024-05-03 NOTE — Progress Notes (Signed)
 CRITICAL RESULT PROVIDER NOTIFICATION  Test performed and critical result:  Hgb 6.2   Date and time result received:  05/03/24 1016  Provider name/title: Dr. Corinne notified  Date and time provider notified: 05/03/24 1016  Date and time provider responded: 05/03/25 1016  Provider response:See new orders

## 2024-05-03 NOTE — Assessment & Plan Note (Addendum)
-   Vascular team following closely  - Presented to ED on 03/25/24 for acute left limb ischemia, - 12/28 underwent a left lower extremity thrombectomy, and angioplasty. Left SFA stent placement on by Dr. Sheree.  - 12/1  f/up for infection and hematoma, noted worsening wound dehiscence - 12/5 patient was brought back today for debridement in OR and and placement of wound VAC.  -Further management per vascular surgery - Patient Eliquis  has been held since 04/29/2024

## 2024-05-03 NOTE — Progress Notes (Signed)
 Pt arrived to floor from PACU. Pt resting comfortably in bed, wound vac on set to suction, pain 3/10, vital signs stable on room air. GI MD bedside to discussed EGD for tomorrow. Pt on clear liquid diet and NPO at midnight.

## 2024-05-03 NOTE — Consult Note (Signed)
 [facility] Consult  Referring Provider: [facility] Primary Care Physician:  Charlott Dorn LABOR, MD Primary Gastroenterologist: Dr.Buccini  Reason for Consultation: Anemia, black stools  HPI: Kyle Kelley is a 78 y.o. male with history of atrial fibrillation, on Eliquis , history of prostate cancer status post prostatectomy and radiation therapy, hypertension, dyslipidemia, aortic stenosis, CVA underwent vascular procedure today(debridement of left below knee postsurgical wound, washout, application of dressing) and was found to have severe anemia with a hemoglobin of 6.2.  Patient states that for the past 3 months he has noticed that stools have been unusually dark, black, usually 1 to 2/day. For the last 3 weeks he has developed worsening exertional shortness of breath although he denies chest pain, dizziness or fatigue. He denies noticing fresh blood or bright red blood in stool. He had lost 15 pounds but states he gained 19 pounds back, denies loss of appetite. Denies nausea, vomiting, acid reflux, heartburn, difficulty swallowing, pain on swallowing. No previous EGD. Denies use of NSAIDs or Goody powders.  Previous GI workup: Cologuard negative 06/2022 Colonoscopy 201, screening, Dr.Johnson: hyperplastic polyps removed from rectum and sigmoid, sigmoid diverticulosis Past Medical History:  Diagnosis Date   Aortic stenosis    Moderate AS with mean AVG 10.8, V-max 2.23m/s, SVI 28, DI 0.3   Carotid artery stenosis    1-39% bilaterally by dopplers 11/2022   Dyslipidemia    Hypertension    Inguinal hernia 2010   Small asymptomatic L inguinal Hernia   Permanent atrial fibrillation (HCC)    atrial fib    Pneumonia    hx of    Prostate cancer (HCC) 07/2012   s/p prostatectomy   PSA elevation    workup per Dr. Alm Fragmin, March 2014   Sigmoid diverticulosis    on colonoscopy in 2010   Skin cancer 02/2012   Dr Renay Homer   Stroke [facility])    Tobacco dependence     rarely smokes-3 cigs per week    Past Surgical History:  Procedure Laterality Date   CARDIOVERSION  08/05/2005   Direct Current Cardioversion   COLONOSCOPY  2010   Direct current Cardioversion  08/05/2005   ENDARTERECTOMY POPLITEAL  03/25/2024   Procedure: ENDARTERECTOMY, LEFT TP TRUNK WITH PATCH ANGIOPLASTY;  Surgeon: Sheree Penne Bruckner, MD;  Location: [facility] OR;  Service: Vascular;;   LOWER EXTREMITY ANGIOGRAM Left 03/25/2024   Procedure: GERALYN, LOWER EXTREMITY;  Surgeon: Sheree Penne Bruckner, MD;  Location: [facility] OR;  Service: Vascular;  Laterality: Left;   PROSTATE BIOPSY  09/06/2012   Dr. Alm Fragmin   ROBOT ASSISTED LAPAROSCOPIC RADICAL PROSTATECTOMY N/A 10/31/2012   Procedure: ROBOTIC ASSISTED LAPAROSCOPIC RADICAL PROSTATECTOMY;  Surgeon: Alm GORMAN Fragmin, MD;  Location: [facility] ORS;  Service: Urology;  Laterality: N/A;   THROMBECTOMY FEMORAL ARTERY Left 03/25/2024   Procedure: THROMBECTOMY, LEFT FEMORAL TO BELOW KNEE ARTERY;  Surgeon: Sheree Penne Bruckner, MD;  Location: [facility] OR;  Service: Vascular;  Laterality: Left;   TONSILLECTOMY AND ADENOIDECTOMY     TONSILLECTOMY AND ADENOIDECTOMY     VEIN HARVEST Left 03/25/2024   Procedure: SURGICAL PROCUREMENT, LEFT LOWER SAPHENOUS VEIN;  Surgeon: Sheree Penne Bruckner, MD;  Location: [facility] OR;  Service: Vascular;  Laterality: Left;    Prior to Admission medications   Medication Sig Start Date End Date Taking? Authorizing Provider  diltiazem  (TIAZAC ) 300 MG 24 hr capsule Take 300 mg by mouth daily.   Yes [provider]  docusate sodium  (COLACE) 100 MG capsule Take 100 mg by mouth  daily as needed for mild constipation.   Yes [provider]  EPINEPHrine  (EPIPEN  IJ) Inject 1 application  as directed as needed (anaphylaxis).   Yes [provider]  metoprolol  succinate (TOPROL -XL) 25 MG 24 hr tablet Take 1 tablet (25 mg total) by mouth daily. 12/25/23  Yes Turner, Wilbert SAUNDERS, MD  rosuvastatin  (CRESTOR ) 10 MG  tablet Take 1 tablet (10 mg total) by mouth daily. 12/25/23  Yes Turner, Wilbert SAUNDERS, MD  apixaban  (ELIQUIS ) 5 MG TABS tablet Take 5 mg by mouth 2 (two) times daily. Patient not taking: Reported on 04/29/2024    [provider]  polyethylene glycol powder (GLYCOLAX /MIRALAX ) 17 GM/SCOOP powder Take 17 g by mouth daily as needed for mild constipation. Dissolve 1 capful (17g) in 4-8 ounces of liquid and take by mouth daily. Patient not taking: Reported on 04/29/2024 03/28/24   Christobal Guadalajara, MD  sulfamethoxazole -trimethoprim  (BACTRIM  DS) 800-160 MG tablet Take 1 tablet by mouth 2 (two) times daily for 14 days. Patient not taking: Reported on 04/29/2024 04/15/24   Serene Gaile ORN, MD    Current Facility-Administered Medications  Medication Dose Route Frequency Provider Last Rate Last Admin   0.9 %  sodium chloride  infusion (Manually program via Guardrails IV Fluids)   Intravenous Once Corinne Garnette BRAVO, MD       0.9 %  sodium chloride  infusion   Intravenous Continuous Sheree Penne Bruckner, MD   New Bag at 05/03/24 1134   0.9 %  sodium chloride  infusion   Intravenous Continuous Shahmehdi, Seyed A, MD       acetaminophen  (TYLENOL ) tablet 650 mg  650 mg Oral Q6H PRN Shahmehdi, Adriana LABOR, MD       Or   acetaminophen  (TYLENOL ) suppository 650 mg  650 mg Rectal Q6H PRN Shahmehdi, Seyed A, MD       bisacodyl  (DULCOLAX) EC tablet 5 mg  5 mg Oral Daily PRN Shahmehdi, Seyed A, MD       chlorhexidine  (HIBICLENS ) 4 % liquid 4 Application  60 mL Topical Once Sheree Penne Bruckner, MD       And   [START ON 05/04/2024] chlorhexidine  (HIBICLENS ) 4 % liquid 4 Application  60 mL Topical Once Sheree Penne Bruckner, MD       [START ON 05/04/2024] diltiazem  (TIAZAC ) 24 hr capsule 300 mg  300 mg Oral Daily Shahmehdi, Seyed A, MD       fentaNYL  (SUBLIMAZE ) injection 25-50 mcg  25-50 mcg Intravenous Q5 min PRN Corinne Garnette BRAVO, MD   25 mcg at 05/03/24 1351   HYDROmorphone  (DILAUDID ) injection 0.5-1 mg  0.5-1 mg  Intravenous Q2H PRN Shahmehdi, Seyed A, MD       ipratropium (ATROVENT ) nebulizer solution 0.5 mg  0.5 mg Nebulization Q6H PRN Shahmehdi, Seyed A, MD       lactated ringers  infusion   Intravenous Continuous Corinne Garnette BRAVO, MD 10 mL/hr at 05/03/24 1134 Restarted at 05/03/24 1301   [START ON 05/04/2024] metoprolol  succinate (TOPROL -XL) 24 hr tablet 25 mg  25 mg Oral Daily Shahmehdi, Seyed A, MD       ondansetron  (ZOFRAN ) injection 4 mg  4 mg Intravenous Once PRN Corinne Garnette BRAVO, MD       ondansetron  (ZOFRAN ) tablet 4 mg  4 mg Oral Q6H PRN Shahmehdi, Seyed A, MD       Or   ondansetron  (ZOFRAN ) injection 4 mg  4 mg Intravenous Q6H PRN Shahmehdi, Adriana LABOR, MD       oxyCODONE  (Oxy IR/ROXICODONE ) immediate  release tablet 5 mg  5 mg Oral Q4H PRN Shahmehdi, Seyed A, MD       rosuvastatin  (CRESTOR ) tablet 10 mg  10 mg Oral Daily Shahmehdi, Seyed A, MD       senna-docusate (Senokot-S) tablet 1 tablet  1 tablet Oral QHS PRN Shahmehdi, Seyed A, MD       sodium chloride  flush (NS) 0.9 % injection 3 mL  3 mL Intravenous Q12H Shahmehdi, Seyed A, MD       sodium chloride  flush (NS) 0.9 % injection 3 mL  3 mL Intravenous Q12H Shahmehdi, Seyed A, MD       sodium phosphate (FLEET) enema 1 enema  1 enema Rectal Once PRN Shahmehdi, Seyed A, MD       traZODone  (DESYREL ) tablet 25 mg  25 mg Oral QHS PRN Willette Adriana LABOR, MD        Allergies as of 04/29/2024 - Review Complete 04/29/2024  Allergen Reaction Noted   Ace inhibitors Anaphylaxis 05/08/2013   Bactrim  ds [sulfamethoxazole -trimethoprim ] Shortness Of Breath 04/29/2024   Rythmol [propafenone] Shortness Of Breath 05/08/2013   Nitrofurantoin Rash 05/08/2013    Family History  Problem Relation Age of Onset   Hypertension Mother    Heart disease Father    Hyperlipidemia Father    Cancer Father        prostate    Social History   Socioeconomic History   Marital status: Married    Spouse name: Not on file   Number of children: Not on file   Years of  education: Not on file   Highest education level: Not on file  Occupational History   Not on file  Tobacco Use   Smoking status: Former    Current packs/day: 0.50    Average packs/day: 0.5 packs/day for 50.0 years (25.0 ttl pk-yrs)    Types: Cigarettes   Smokeless tobacco: Never  Vaping Use   Vaping status: Never Used  Substance and Sexual Activity   Alcohol use: Yes    Comment: rare, heavier in the past   Drug use: No   Sexual activity: Never  Other Topics Concern   Not on file  Social History Narrative   Tobacco Use cigarettes: Current Smoker   Smoking: Yes, less than 1/2 pk per month   Alcohol: Yes - No Alcohol since June 2005   Caffeine: yes   No recreational drug use   Occupation: Employed   Marital Status: Married 38 years, wife has MS   Children: none   Social Drivers of Corporate Investment Banker Strain: Not on file  Food Insecurity: No Food Insecurity (03/26/2024)   Hunger Vital Sign    Worried About Running Out of Food in the Last Year: Never true    Ran Out of Food in the Last Year: Never true  Transportation Needs: No Transportation Needs (03/26/2024)   PRAPARE - Administrator, Civil Service (Medical): No    Lack of Transportation (Non-Medical): No  Physical Activity: Not on file  Stress: Not on file  Social Connections: Moderately Integrated (03/26/2024)   Social Connection and Isolation Panel    Frequency of Communication with Friends and Family: Twice a week    Frequency of Social Gatherings with Friends and Family: Once a week    Attends Religious Services: 1 to 4 times per year    Active Member of [employer] or Organizations: No    Attends Banker Meetings: Never    Marital Status: Married  Intimate Partner Violence: Not At Risk (03/26/2024)   Humiliation, Afraid, Rape, and Kick questionnaire    Fear of Current or Ex-Partner: No    Emotionally Abused: No    Physically Abused: No    Sexually Abused: No    Review of  Systems: As per HPI left.  Physical Exam: Vital signs in last 24 hours: Temp:  [97.7 F (36.5 C)-98 F (36.7 C)] 98 F (36.7 C) (12/05 1252) Pulse Rate:  [54-78] 58 (12/05 1345) Resp:  [11-19] 12 (12/05 1345) BP: (97-126)/(53-75) 97/55 (12/05 1345) SpO2:  [100 %] 100 % (12/05 1345) Weight:  [77.6 kg] 77.6 kg (12/05 0850)    General:   Alert,  Well-developed, well-nourished, pleasant and cooperative in NAD Head:  Normocephalic and atraumatic. Eyes:  Sclera clear, no icterus.  Prominent pallor  Ears:  Normal auditory acuity. Nose:  No deformity, discharge,  or lesions. Mouth:  No deformity or lesions.  Oropharynx pink & moist. Neck:  Supple; no masses or thyromegaly. Lungs:  Clear throughout to auscultation.   No wheezes, crackles, or rhonchi. No acute distress. Heart:  Regular rate and rhythm; no murmurs, clicks, rubs,  or gallops. Extremities: Left lower extremity dressing and wound VAC in place Neurologic:  Alert and  oriented x4;  grossly normal neurologically. Skin:  Intact without significant lesions or rashes. Psych:  Alert and cooperative. Normal mood and affect. Abdomen:  Soft, nontender and nondistended. No masses, hepatosplenomegaly or hernias noted. Normal bowel sounds, without guarding, and without rebound.         Lab Results: Recent Labs    05/03/24 0933  WBC 6.7  HGB 6.2*  HCT 20.8*  PLT 359   BMET Recent Labs    05/03/24 0933  NA 131*  K 3.8  CL 98  CO2 23  GLUCOSE 96  BUN 23  CREATININE 0.71  CALCIUM  7.8*   LFT No results for input(s): PROT, ALBUMIN , AST, ALT, ALKPHOS, BILITOT, BILIDIR, IBILI in the last 72 hours. PT/INR No results for input(s): LABPROT, INR in the last 72 hours.  Studies/Results: No results found.  Impression: Significant symptomatic anemia,Hemoglobin 6.2, baseline hemoglobin around 11 History of black stools for 3 months Mild hypotension, 105/59 mmHg  Was on Eliquis , as per patient last dose of  Eliquis  was yesterday, as per documentation from admitting hospitalist, last dose of Eliquis  was 04/29/2024  Comorbidities: Ischemia of left lower extremity, status post left lower extremity thrombectomy and angioplasty on 05/26/2024, left SFA stent placement, wound dehiscence, debridement and placement of wound VAC on 05/03/2024  History of stroke, atrial fibrillation, aortic stenosis, EF 55 to 60%, hypertension  Plan: Diagnostic EGD in a.m. with Dr. Elicia. Will start patient on clear liquid diet and keep n.p.o. postmidnight. I have started patient on pantoprazole  40 mg IV twice daily. 2 unit PRBC transfusion has been given.   LOS: 0 days   Estelita Manas, MD  05/03/2024, 2:39 PM

## 2024-05-03 NOTE — Transfer of Care (Signed)
 Immediate Anesthesia Transfer of Care Note  Patient: Kyle Kelley  Procedure(s) Performed: IRRIGATION AND DEBRIDEMENT WOUND, LEFT LOWER EXTREMITY (Left: Leg Lower) APPLICATION, LEFT LEG WOUND VAC (Left: Leg Lower) APPLICATION, SKIN SUBSTITUTE KERECIS 38 SQ CM (Left: Leg Lower)  Patient Location: PACU  Anesthesia Type:General  Level of Consciousness: awake and drowsy  Airway & Oxygen Therapy: Patient Spontanous Breathing  Post-op Assessment: Report given to RN, Post -op Vital signs reviewed and stable, and Patient moving all extremities  Post vital signs: Reviewed and stable  Last Vitals:  Vitals Value Taken Time  BP 105/53 05/03/24 13:00  Temp    Pulse 59 05/03/24 13:02  Resp 12 05/03/24 13:02  SpO2 100 % 05/03/24 13:02  Vitals shown include unfiled device data.  Last Pain:  Vitals:   05/03/24 0938  TempSrc:   PainSc: 0-No pain         Complications: No notable events documented.

## 2024-05-03 NOTE — Anesthesia Postprocedure Evaluation (Signed)
 Anesthesia Post Note  Patient: Kyle Kelley  Procedure(s) Performed: IRRIGATION AND DEBRIDEMENT WOUND, LEFT LOWER EXTREMITY (Left: Leg Lower) APPLICATION, LEFT LEG WOUND VAC (Left: Leg Lower) APPLICATION, SKIN SUBSTITUTE KERECIS 38 SQ CM (Left: Leg Lower)     Patient location during evaluation: PACU Anesthesia Type: General Level of consciousness: awake and alert Pain management: pain level controlled Vital Signs Assessment: post-procedure vital signs reviewed and stable Respiratory status: spontaneous breathing, nonlabored ventilation, respiratory function stable and patient connected to nasal cannula oxygen Cardiovascular status: blood pressure returned to baseline and stable Postop Assessment: no apparent nausea or vomiting Anesthetic complications: no   No notable events documented.  Last Vitals:  Vitals:   05/03/24 1530 05/03/24 1603  BP: (!) 110/59 (!) 105/59  Pulse: (!) 59 64  Resp: 11   Temp:  36.7 C  SpO2: 98% 100%    Last Pain:  Vitals:   05/03/24 1603  TempSrc: Oral  PainSc: 3                  Garnette FORBES Skillern

## 2024-05-03 NOTE — Plan of Care (Signed)

## 2024-05-03 NOTE — Anesthesia Procedure Notes (Signed)
 Procedure Name: Intubation Date/Time: 05/03/2024 11:51 AM  Performed by: Jerl Donald LABOR, CRNAPre-anesthesia Checklist: Patient identified, Emergency Drugs available, Suction available and Patient being monitored Patient Re-evaluated:Patient Re-evaluated prior to induction Oxygen Delivery Method: Circle System Utilized Preoxygenation: Pre-oxygenation with 100% oxygen Induction Type: IV induction Ventilation: Mask ventilation without difficulty Laryngoscope Size: Mac and 3 Grade View: Grade I Tube type: Oral Tube size: 7.5 mm Number of attempts: 1 Airway Equipment and Method: Stylet Placement Confirmation: ETT inserted through vocal cords under direct vision, positive ETCO2 and breath sounds checked- equal and bilateral Secured at: 23 cm Tube secured with: Tape Dental Injury: Teeth and Oropharynx as per pre-operative assessment

## 2024-05-03 NOTE — Op Note (Signed)
    Patient name: CRANFORD BLESSINGER MRN: [id number] DOB: 06/02/1945 Sex: male  05/03/2024 Pre-operative Diagnosis: Left lower extremity post surgical wound Post-operative diagnosis:  Same Surgeon:  Penne C. Sheree, MD Assistant: Ahmed Holster, PA Procedure Performed: 1.  Sharp excisional debridement left below-knee postsurgical wound including skin, subcutaneous tissue and fascia using 10 blade, cautery and medium curette to a total of 15 x 4 x 4 cm 2.  Washout left lower extremity postsurgical wound with 3 L of saline and 1 L of Vashe using pulse evacuation 3.  Application 38 cm Kerecis fish skin 4.  Application negative pressure dressing  Indications: 78 year old male with history of acute on chronic left lower extremity limb threatening ischemia secondary to atrial fibrillation embolus.  He required below-knee endarterectomy with vein patch angioplasty as well as stenting of his left SFA.  He returned on Eliquis  and has been maintained on Plavix .  He is now having occult GI bleeding with symptomatic anemia and hematoma in the left below-knee incision that required drainage in the office and now has breakdown of the tissue requiring surgical intervention.  An experienced assistant was necessary to facilitate debridement of the wound as well as fashioning wound VAC to place overlying the wound.  Findings: There was significant fibrinous exudate and heaped granulation tissue.  The artery was not visualized.  The tissue was debrided back using cautery, knife and curette to healthy appearing bleeding tissue and hemostasis was obtained.  I elected to place fish skin deep in the wound and fashioned a wound VAC.   Procedure:  The patient was identified in the holding area and taken to the operating room where he was placed upon operative table and general anesthesia was induced.  He was sterilely prepped and draped in the left lower extremity usual fashion, antibiotics were administered and a timeout was  called.  I began by sharply opening the wound and removing all of the unhealthy skin using using 10 blade.  I then used cautery to remove unhealthy appearing fascia.  A curette was used to remove the unhealthy granulation tissue.  We then washed out the wound using 3 L of saline with a Pulsavac.  Hemostasis was then obtained in the wound with cautery.  I also washed the wound out with 1 L of Vashe.  We applied 38 cm of fish skin and then fashioned a wound VAC and this was placed to -125 mmHg suction.  He was then awakened from anesthesia having tolerated the procedure without Ameeth complication.  All counts were correct at completion.  EBL: 50cc   Marrian Bells C. Sheree, MD [facility] Office: [phone] Pager: [phone]

## 2024-05-03 NOTE — H&P (Signed)
 History and Physical   Patient: Kyle Kelley                            PCP: Charlott Dorn LABOR, MD                    DOB: Nov 03, 1945            DOA: 05/03/2024 [id number]             DOS: 05/03/2024, 2:06 PM  Charlott Dorn LABOR, MD  Patient coming from:   HOME  I have personally reviewed patient's medical records, in electronic medical records, including:  [place] link, and care everywhere.    Chief Complaint:   Acute on chronic left lower extremity wound, symptomatic anemia   History of present illness:    BREVAN LUBERTO is a 78 year old male with extensive history of atrial fibrillation (on Eliquis /recently held), vasculopath, with acute on chronic ischemic left lower extremity nonhealing wound, HTN, HLD, Aortic stenosis, CVA, prostate cancer s/p prostatectomy 2014,.. Presented today to the vascular team for follow-up left lower extremity wound.    It appears the patient presented to ED on 03/25/2024 for acute left limb ischemia, vascular was consulted underwent a left lower extremity thrombectomy, and angioplasty.,  Left SFA stent placement on 05/26/2024 by Dr. Sheree.[name]  He has been followed closely by vascular team on outpatient for infection, hematoma of left lower extremity.  Per vascular surgery on 04/29/2024 follow-up visit was noted worsening wound dehiscence-patient was brought back today 05/03/2024 for debridement in OR and placement of wound VAC.  Patient Eliquis  has been held since 04/29/2024  Today patient was hemodynamically stable: With blood pressure 126/75, pulse of 78 satting 100% .  But labs revealed: hemoglobin of 6.2, last hemoglobin in  medical record in October was 11.3.  Vascular team requested patient to be admitted for further evaluation -blood transfusion, consultation from cardiology regarding withholding anticoagulation and GI.   Patient Denies having: Fever, Chills, Cough, SOB, Chest Pain, Abd pain, N/V/D, headache Dysuria, Joint pain, rash,  open wounds    Review of Systems: As per HPI, otherwise 10 point review of systems were negative.   ----------------------------------------------------------------------------------------------------------------------  Allergies  Allergen Reactions   Ace Inhibitors Anaphylaxis   Bactrim  Ds [Sulfamethoxazole -Trimethoprim ] Shortness Of Breath   Rythmol [Propafenone] Shortness Of Breath   Nitrofurantoin Rash    Home MEDs:  Prior to Admission medications   Medication Sig Start Date End Date Taking? Authorizing Provider  diltiazem  (TIAZAC ) 300 MG 24 hr capsule Take 300 mg by mouth daily.   Yes [provider]  docusate sodium  (COLACE) 100 MG capsule Take 100 mg by mouth daily as needed for mild constipation.   Yes [provider]  EPINEPHrine  (EPIPEN  IJ) Inject 1 application  as directed as needed (anaphylaxis).   Yes [provider]  metoprolol  succinate (TOPROL -XL) 25 MG 24 hr tablet Take 1 tablet (25 mg total) by mouth daily. 12/25/23  Yes Turner, Wilbert SAUNDERS, MD  rosuvastatin  (CRESTOR ) 10 MG tablet Take 1 tablet (10 mg total) by mouth daily. 12/25/23  Yes Turner, Wilbert SAUNDERS, MD  apixaban  (ELIQUIS ) 5 MG TABS tablet Take 5 mg by mouth 2 (two) times daily. Patient not taking: Reported on 04/29/2024    [provider]  polyethylene glycol powder (GLYCOLAX /MIRALAX ) 17 GM/SCOOP powder Take 17 g by mouth daily as needed for mild constipation. Dissolve 1 capful (17g) in 4-8 ounces of  liquid and take by mouth daily. Patient not taking: Reported on 04/29/2024 03/28/24   Christobal Guadalajara, MD  sulfamethoxazole -trimethoprim  (BACTRIM  DS) 800-160 MG tablet Take 1 tablet by mouth 2 (two) times daily for 14 days. Patient not taking: Reported on 04/29/2024 04/15/24   Serene Gaile ORN, MD    PRN MEDs: acetaminophen  **OR** acetaminophen , bisacodyl , fentaNYL  (SUBLIMAZE ) injection, hydrALAZINE , HYDROmorphone  (DILAUDID ) injection, ipratropium, ondansetron  (ZOFRAN ) IV, ondansetron  **OR**  ondansetron  (ZOFRAN ) IV, oxyCODONE , senna-docusate, sodium phosphate, traZODone   Past Medical History:  Diagnosis Date   Aortic stenosis    Moderate AS with mean AVG 10.8, V-max 2.58m/s, SVI 28, DI 0.3   Carotid artery stenosis    1-39% bilaterally by dopplers 11/2022   Dyslipidemia    Hypertension    Inguinal hernia 2010   Small asymptomatic L inguinal Hernia   Permanent atrial fibrillation (HCC)    atrial fib    Pneumonia    hx of    Prostate cancer (HCC) 07/2012   s/p prostatectomy   PSA elevation    workup per Dr. Alm Fragmin, March 2014   Sigmoid diverticulosis    on colonoscopy in 2010   Skin cancer 02/2012   Dr Renay Homer   Stroke [facility])    Tobacco dependence    rarely smokes-3 cigs per week    Past Surgical History:  Procedure Laterality Date   CARDIOVERSION  08/05/2005   Direct Current Cardioversion   COLONOSCOPY  2010   Direct current Cardioversion  08/05/2005   ENDARTERECTOMY POPLITEAL  03/25/2024   Procedure: ENDARTERECTOMY, LEFT TP TRUNK WITH PATCH ANGIOPLASTY;  Surgeon: Sheree Penne Bruckner, MD;  Location: [facility] OR;  Service: Vascular;;   LOWER EXTREMITY ANGIOGRAM Left 03/25/2024   Procedure: GERALYN, LOWER EXTREMITY;  Surgeon: Sheree Penne Bruckner, MD;  Location: [facility] OR;  Service: Vascular;  Laterality: Left;   PROSTATE BIOPSY  09/06/2012   Dr. Alm Fragmin   ROBOT ASSISTED LAPAROSCOPIC RADICAL PROSTATECTOMY N/A 10/31/2012   Procedure: ROBOTIC ASSISTED LAPAROSCOPIC RADICAL PROSTATECTOMY;  Surgeon: Alm GORMAN Fragmin, MD;  Location: [facility] ORS;  Service: Urology;  Laterality: N/A;   THROMBECTOMY FEMORAL ARTERY Left 03/25/2024   Procedure: THROMBECTOMY, LEFT FEMORAL TO BELOW KNEE ARTERY;  Surgeon: Sheree Penne Bruckner, MD;  Location: [facility] OR;  Service: Vascular;  Laterality: Left;   TONSILLECTOMY AND ADENOIDECTOMY     TONSILLECTOMY AND ADENOIDECTOMY     VEIN HARVEST Left 03/25/2024   Procedure: SURGICAL PROCUREMENT, LEFT LOWER SAPHENOUS VEIN;  Surgeon: Sheree Penne Bruckner, MD;  Location: [facility] OR;  Service: Vascular;  Laterality: Left;     reports that he has quit smoking. His smoking use included cigarettes. He has a 25 pack-year smoking history. He has never used smokeless tobacco. He reports current alcohol use. He reports that he does not use drugs.   Family History  Problem Relation Age of Onset   Hypertension Mother    Heart disease Father    Hyperlipidemia Father    Cancer Father        prostate    Physical Exam:   Vitals:   05/03/24 1300 05/03/24 1315 05/03/24 1330 05/03/24 1345  BP: (!) 105/53 (!) 107/55 (!) 97/57 (!) 97/55  Pulse: (!) 59 (!) 58 (!) 54 (!) 58  Resp: 11 11 11 12   Temp:      TempSrc:      SpO2: 100% 100% 100% 100%  Weight:      Height:       Constitutional: NAD, calm, comfortable Eyes: PERRL, lids and conjunctivae  normal ENMT: Mucous membranes are moist. Posterior pharynx clear of any exudate or lesions.Normal dentition.  Neck: normal, supple, no masses, no thyromegaly Respiratory: clear to auscultation bilaterally, no wheezing, no crackles. Normal respiratory effort. No accessory muscle use.  Cardiovascular: Regular rate and rhythm, no murmurs / rubs / gallops. No extremity edema. 2+ pedal pulses. No carotid bruits.  Abdomen: no tenderness, no masses palpated. No hepatosplenomegaly. Bowel sounds positive.  Musculoskeletal: no clubbing / cyanosis. No joint deformity upper and lower extremities. Good ROM, no contractures. Normal muscle tone.  Neurologic: CN II-XII grossly intact. Sensation intact, DTR normal. Strength 5/5 in all 4.  Psychiatric: Normal judgment and insight. Alert and oriented x 3. Normal mood.  Skin: no rashes, lesions, ulcers.  Chronic left lower extremity wound, s/p debridement dressing in place Decubitus/ulcers: None visible Wounds: Left lower extremity        Labs on admission:    I have personally reviewed following labs and imaging studies  CBC: Recent Labs  Lab  05/03/24 0933  WBC 6.7  HGB 6.2*  HCT 20.8*  MCV 97.7  PLT 359   Basic Metabolic Panel: Recent Labs  Lab 05/03/24 0933  NA 131*  K 3.8  CL 98  CO2 23  GLUCOSE 96  BUN 23  CREATININE 0.71  CALCIUM  7.8*    Urine analysis:    Component Value Date/Time   COLORURINE YELLOW 02/27/2024 2142   APPEARANCEUR CLEAR 02/27/2024 2142   LABSPEC 1.023 02/27/2024 2142   PHURINE 5.0 02/27/2024 2142   GLUCOSEU NEGATIVE 02/27/2024 2142   HGBUR NEGATIVE 02/27/2024 2142   BILIRUBINUR NEGATIVE 02/27/2024 2142   KETONESUR 5 (A) 02/27/2024 2142   PROTEINUR NEGATIVE 02/27/2024 2142   NITRITE NEGATIVE 02/27/2024 2142   LEUKOCYTESUR NEGATIVE 02/27/2024 2142    Last A1C:  Lab Results  Component Value Date   HGBA1C 5.7 (H) 02/29/2024     Radiologic Exams on Admission:   No results found.  EKG:   Independently reviewed.  Orders placed or performed during the hospital encounter of 05/03/24   EKG 12-Lead   ---------------------------------------------------------------------------------------------------------------------------------------    Assessment / Plan:   Principal Problem:   Symptomatic anemia Active Problems:   Dizziness   Atrial fibrillation (HCC)   ICH (intracerebral hemorrhage) (HCC)   Critical limb ischemia of left lower extremity (HCC)   Essential hypertension, benign   Aortic stenosis   Mixed hyperlipidemia   History of stroke without residual deficits   Assessment and Plan: * Symptomatic anemia Mildly symptomatic with generalized weakness, sporadic dizziness Does report melena-last BM 05/02/2024  -Denies any chest pain or shortness of breath -Hemoglobin noted to be 6.2 last hemoglobin in October 2025 was 11.3 -Currently being transfused with 2U PRBC -Withholding Eliquis  -Vascular would like to add Plavix   Will pursue anemia workup -Will add iron studies, Hemoccult, B12 and folate -Pending IV Protonix  40 mg twice daily -GI consulted-appreciate  further evaluation recommendations  Critical limb ischemia of left lower extremity (HCC) - Vascular team following closely  - Presented to ED on 03/25/24 for acute left limb ischemia, - 12/28 underwent a left lower extremity thrombectomy, and angioplasty. Left SFA stent placement on by Dr. Sheree.  - 12/1  f/up for infection and hematoma, noted worsening wound dehiscence - 12/5 patient was brought back today for debridement in OR and and placement of wound VAC.  -Further management per vascular surgery - Patient Eliquis  has been held since 04/29/2024    ICH (intracerebral hemorrhage) (HCC) Remote history of intracranial hemorrhage -  Due to A-fib patient has been anticoagulated on Eliquis , which has been held since 04/29/2024  Atrial fibrillation (HCC) - Rate controlled, 78 currently, - Continue rate control with home medication of diltiazem  and metoprolol  -Eliquis  has been held since 04/29/2024, vascular would like to initiate Plavix  -Continue to hold Eliquis  due to anemia-pending till further evaluation by cardiology and GI   Dizziness - Secondary to anemia, BP stable -Continue with 2U PRBC blood transfusion, gentle IV fluid hydration as needed -Will monitor closely  Mixed hyperlipidemia Continue statins  Aortic stenosis - Last echo 03/18/2024-reviewed EF 55-60%, normal LV function, mildly enlarged right ventricle, mildly reduced RV function, aortic valve was not well visualized no significant stenosis was reported -Currently asymptomatic, stable  Essential hypertension, benign - Currently blood pressure stable - Home medications reviewed will need to continue metoprolol  and diltiazem  for rate control, monitoring BP closely  History of stroke without residual deficits - No new focal neurological findings -Holding Eliquis , continue statins (Vascular like to add Plavix -once hemoglobin stabilized)        Consults called: Vascular  team/cardiology/gastroenterologist -------------------------------------------------------------------------------------------------------------------------------------------- DVT prophylaxis:  SCDs Start: 05/03/24 1250   Code Status:   Code Status: Full Code   Admission status: Patient will be admitted as Inpatient, with a greater than 2 midnight length of stay. Level of care: Telemetry   Family Communication:  none at bedside  (The above findings and plan of care has been discussed with patient in detail, the patient expressed understanding and agreement of above plan)  --------------------------------------------------------------------------------------------------------------------------------------------------  Disposition Plan:  Anticipated 1-2 days Status is: Inpatient Remains inpatient appropriate because: Needing blood transfusion, GI evaluation, cardiology evaluation, and continues vascular evaluation for possible further debridement and wound VAC placement     ----------------------------------------------------------------------------------------------------------------------------------------------------  Time spent:  40  Min.  Was spent seeing and evaluating the patient, reviewing all medical records, drawn plan of care.  SIGNED: Adriana DELENA Grams, MD, FHM. FAAFP. [facility], Pager  (Please use [email or web address] to page/ or secure chat through epic) If 7PM-7AM, please contact night-coverage [email or web address],  05/03/2024, 2:06 PM

## 2024-05-03 NOTE — Assessment & Plan Note (Signed)
-   Secondary to anemia, BP stable -Continue with 2U PRBC blood transfusion, gentle IV fluid hydration as needed -Will monitor closely

## 2024-05-03 NOTE — Interval H&P Note (Signed)
 History and Physical Interval Note:  05/03/2024 11:29 AM  Kyle Kelley  has presented today for surgery, with the diagnosis of SURGICAL WOUND INFECTION.  The various methods of treatment have been discussed with the patient and family. After consideration of risks, benefits and other options for treatment, the patient has consented to  Procedure(s): IRRIGATION AND DEBRIDEMENT WOUND (Left) APPLICATION, WOUND VAC (Left) as a surgical intervention.  The patient's history has been reviewed, patient examined, no change in status, stable for surgery.  I have reviewed the patient's chart and labs.  Questions were answered to the patient's satisfaction.     [name]

## 2024-05-03 NOTE — Assessment & Plan Note (Addendum)
 Mildly symptomatic with generalized weakness, sporadic dizziness Does report melena-last BM 05/02/2024  -Denies any chest pain or shortness of breath -Hemoglobin noted to be 6.2 last hemoglobin in October 2025 was 11.3 -Currently being transfused with 2U PRBC -Withholding Eliquis  -Vascular would like to add Plavix   Will pursue anemia workup -Will add iron studies, Hemoccult, B12 and folate -Pending IV Protonix  40 mg twice daily -GI consulted-appreciate further evaluation recommendations

## 2024-05-03 NOTE — Assessment & Plan Note (Addendum)
-   Currently blood pressure stable - Home medications reviewed will need to continue metoprolol  and diltiazem  for rate control, monitoring BP closely

## 2024-05-04 ENCOUNTER — Encounter (HOSPITAL_COMMUNITY)
Admission: RE | Disposition: A | Discharge status: Skilled Nursing Facility | Payer: Self-pay | Source: Home / Self Care | Attending: Internal Medicine

## 2024-05-04 ENCOUNTER — Encounter (HOSPITAL_COMMUNITY): Payer: Self-pay | Admitting: Family Medicine

## 2024-05-04 ENCOUNTER — Inpatient Hospital Stay (HOSPITAL_COMMUNITY): Admitting: Anesthesiology

## 2024-05-04 ENCOUNTER — Other Ambulatory Visit: Payer: Self-pay

## 2024-05-04 DIAGNOSIS — D649 Anemia, unspecified: Secondary | ICD-10-CM | POA: Diagnosis not present

## 2024-05-04 HISTORY — PX: ESOPHAGOGASTRODUODENOSCOPY: SHX5428

## 2024-05-04 LAB — BPAM RBC
Blood Product Expiration Date: 202512312359
Blood Product Expiration Date: 202601012359
ISSUE DATE / TIME: 202512051147
ISSUE DATE / TIME: 202512051147
Unit Type and Rh: 5100
Unit Type and Rh: 5100

## 2024-05-04 LAB — CBC
HCT: 28.3 % — ABNORMAL LOW (ref 39.0–52.0)
Hemoglobin: 9 g/dL — ABNORMAL LOW (ref 13.0–17.0)
MCH: 29.1 pg (ref 26.0–34.0)
MCHC: 31.8 g/dL (ref 30.0–36.0)
MCV: 91.6 fL (ref 80.0–100.0)
Platelets: 306 K/uL (ref 150–400)
RBC: 3.09 MIL/uL — ABNORMAL LOW (ref 4.22–5.81)
RDW: 15.9 % — ABNORMAL HIGH (ref 11.5–15.5)
WBC: 3.3 K/uL — ABNORMAL LOW (ref 4.0–10.5)
nRBC: 3 % — ABNORMAL HIGH (ref 0.0–0.2)

## 2024-05-04 LAB — TYPE AND SCREEN
ABO/RH(D): O POS
Antibody Screen: NEGATIVE
Unit division: 0
Unit division: 0

## 2024-05-04 LAB — PROTIME-INR
INR: 1.2 (ref 0.8–1.2)
Prothrombin Time: 15.6 s — ABNORMAL HIGH (ref 11.4–15.2)

## 2024-05-04 LAB — APTT: aPTT: 29 s (ref 24–36)

## 2024-05-04 MED ORDER — PROPOFOL 500 MG/50ML IV EMUL
INTRAVENOUS | Status: DC | PRN
  Administered 2024-05-04: 80 ug/kg/min via INTRAVENOUS

## 2024-05-04 MED ORDER — PHENYLEPHRINE 80 MCG/ML (10ML) SYRINGE FOR IV PUSH (FOR BLOOD PRESSURE SUPPORT)
PREFILLED_SYRINGE | INTRAVENOUS | Status: DC | PRN
  Administered 2024-05-04 (×2): 80 ug via INTRAVENOUS

## 2024-05-04 MED ORDER — PROPOFOL 10 MG/ML IV BOLUS
INTRAVENOUS | Status: DC | PRN
  Administered 2024-05-04: 50 mg via INTRAVENOUS

## 2024-05-04 NOTE — Interval H&P Note (Signed)
 History and Physical Interval Note:  05/04/2024 9:48 AM  Kyle Kelley  has presented today for surgery, with the diagnosis of Melena, anemia, last dose of Eliquis  05/02/24.  The various methods of treatment have been discussed with the patient and family. After consideration of risks, benefits and other options for treatment, the patient has consented to  Procedure(s): EGD (ESOPHAGOGASTRODUODENOSCOPY) (N/A) as a surgical intervention.  The patient's history has been reviewed, patient examined, no change in status, stable for surgery.  I have reviewed the patient's chart and labs.  Questions were answered to the patient's satisfaction.     Denaisha Swango

## 2024-05-04 NOTE — Progress Notes (Signed)
 PT Cancellation Note  Patient Details Name: Kyle Kelley MRN: [id number] DOB: [birth year]   Cancelled Treatment:    Reason Eval/Treat Not Completed: Pain limiting ability to participate (Per RN, pt is having a lot of abdominal pain following EGD. Requests PT and OT to hold off until tomorrow.)   [name] 05/04/2024, 1:37 PM

## 2024-05-04 NOTE — Transfer of Care (Signed)
 Immediate Anesthesia Transfer of Care Note  Patient: MILIK GILREATH  Procedure(s) Performed: EGD (ESOPHAGOGASTRODUODENOSCOPY)  Patient Location: PACU  Anesthesia Type:MAC  Level of Consciousness: awake and alert   Airway & Oxygen Therapy: Patient Spontanous Breathing and Patient connected to nasal cannula oxygen  Post-op Assessment: Report given to RN and Post -op Vital signs reviewed and stable  Post vital signs: Reviewed and stable  Last Vitals:  Vitals Value Taken Time  BP 113/103 05/04/24 10:15  Temp    Pulse 74 05/04/24 10:18  Resp 22 05/04/24 10:18  SpO2 99 % 05/04/24 10:18  Vitals shown include unfiled device data.  Last Pain:  Vitals:   05/04/24 0912  TempSrc: Temporal  PainSc: 0-No pain         Complications: No notable events documented.

## 2024-05-04 NOTE — Progress Notes (Signed)
 Pt arrived from PACU with 8/10 abdominal pain, vital signs stable on 2L [place]. Pt transferred from stretcher to bed. Telemetry placed on patient and wound vac in place. Pt pain is improving with time and pt does not want any pain medicine at this time. RN will continue to monitor pt.

## 2024-05-04 NOTE — Anesthesia Postprocedure Evaluation (Signed)
 Anesthesia Post Note  Patient: Kyle Kelley  Procedure(s) Performed: EGD (ESOPHAGOGASTRODUODENOSCOPY)     Patient location during evaluation: PACU Anesthesia Type: MAC Level of consciousness: awake Pain management: pain level controlled Vital Signs Assessment: post-procedure vital signs reviewed and stable Respiratory status: spontaneous breathing, nonlabored ventilation and respiratory function stable Cardiovascular status: blood pressure returned to baseline and stable Postop Assessment: no apparent nausea or vomiting Anesthetic complications: no   No notable events documented.  Last Vitals:  Vitals:   05/04/24 1623 05/04/24 2040  BP: 132/85 132/71  Pulse: 91 97  Resp: 18 16  Temp: (!) 36.3 C 36.7 C  SpO2: 96% 94%    Last Pain:  Vitals:   05/04/24 2051  TempSrc:   PainSc: 9                  Rahmir Beever P Christella App

## 2024-05-04 NOTE — Op Note (Signed)
 [facility] Patient Name: Kyle Kelley Procedure Date : 05/04/2024 MRN: [id number] Attending MD: Layla Lah , MD, [id number] Date of Birth: [birth year] CSN: [id number] Age: 78 Admit Type: Inpatient Procedure:                Upper GI endoscopy Indications:              Melena Providers:                Layla Lah, MD, Hoy Penner, RN, [facility]                            [place], Technician, Gerlene Gentry Referring MD:              Medicines:                Sedation Administered by an Anesthesia Professional Complications:            No immediate complications. Estimated Blood Loss:     Estimated blood loss was minimal. Procedure:                Pre-Anesthesia Assessment:                           - Prior to the procedure, a History and Physical                            was performed, and patient medications and                            allergies were reviewed. The patient's tolerance of                            previous anesthesia was also reviewed. The risks                            and benefits of the procedure and the sedation                            options and risks were discussed with the patient.                            All questions were answered, and informed consent                            was obtained. Prior Anticoagulants: The patient has                            taken Eliquis  (apixaban ), last dose was 2 days                            prior to procedure. ASA Grade Assessment: III - A                            patient with severe systemic disease. After  reviewing the risks and benefits, the patient was                            deemed in satisfactory condition to undergo the                            procedure.                           After obtaining informed consent, the endoscope was                            passed under direct vision. Throughout the                            procedure, the  patient's blood pressure, pulse, and                            oxygen saturations were monitored continuously. The                            [id number]-[id number] ([id number]) Olympus endoscope was introduced                            through the mouth, and advanced to the second part                            of duodenum. The upper GI endoscopy was                            accomplished without difficulty. The patient                            tolerated the procedure well. Scope In: Scope Out: Findings:      The Z-line was regular and was found 35 cm from the incisors.      A medium-sized hiatal hernia was present.      Scattered mild inflammation characterized by congestion (edema),       erosions and erythema was found in the entire examined stomach. Biopsies       were taken with a cold forceps for histology.      The cardia and gastric fundus were normal on retroflexion.      One non-bleeding cratered duodenal ulcer with no stigmata of bleeding       was found in the duodenal bulb. The lesion was 15 mm in largest       dimension.      The first portion of the duodenum and second portion of the duodenum       were normal. Impression:               - Z-line regular, 35 cm from the incisors.                           - Medium-sized hiatal hernia.                           -  Gastritis. Biopsied.                           - Non-bleeding duodenal ulcer with no stigmata of                            bleeding.                           - Normal first portion of the duodenum and second                            portion of the duodenum. Recommendation:           - Return patient to hospital ward for ongoing care.                           - Soft diet.                           - Continue present medications.                           - Await pathology results. Procedure Code(s):        --- Professional ---                           [phone], Esophagogastroduodenoscopy, flexible,                             transoral; with biopsy, single or multiple Diagnosis Code(s):        --- Professional ---                           K44.9, Diaphragmatic hernia without obstruction or                            gangrene                           K29.70, Gastritis, unspecified, without bleeding                           K26.9, Duodenal ulcer, unspecified as acute or                            chronic, without hemorrhage or perforation                           K92.1, Melena (includes Hematochezia) CPT copyright 2022 American [employer] Association. All rights reserved. The codes documented in this report are preliminary and upon coder review may  be revised to meet current compliance requirements. Layla Lah, MD Layla Lah, MD 05/04/2024 10:15:08 AM Number of Addenda: 0

## 2024-05-04 NOTE — Progress Notes (Signed)
 OT Cancellation Note  Patient Details Name: Kyle Kelley MRN: [id number] DOB: [birth year]   Cancelled Treatment:    Reason Eval/Treat Not Completed: Pain limiting ability to participate (Per RN, pt with singificant abdominal pain follow EGD and requesting OT hold until tomorrow. OT to reattempt to see pt at a later time as appropriate/available.)  Margarie Rockey HERO., OTR/L, MA Acute Rehab [phone]   Margarie FORBES Horns 05/04/2024, 1:49 PM

## 2024-05-04 NOTE — Progress Notes (Signed)
 Progress Note   Patient: Kyle Kelley FMW:[id number] DOB: 1946-01-08 DOA: 05/03/2024     1 DOS: the patient was seen and examined on 05/04/2024    Brief hospital course: Kyle Kelley is a 78 year old male with extensive history of atrial fibrillation (on Eliquis /recently held), vasculopath, with acute on chronic ischemic left lower extremity nonhealing wound, HTN, HLD, Aortic stenosis, CVA, prostate cancer s/p prostatectomy 2014 who presented on 12/5 to the vascular team for follow-up left lower extremity wound.   It appears the patient presented to ED on 03/25/2024 for acute left limb ischemia, vascular was consulted underwent a left lower extremity thrombectomy, and angioplasty.,  Left SFA stent placement on 05/26/2024 by Dr. Sheree. Per vascular surgery on 04/29/2024 follow-up visit was noted worsening wound dehiscence-patient was brought back 05/03/2024 for debridement in OR and placement of wound VAC. Patient Eliquis  has been held since 04/29/2024.  Labs on 12/5 revealed hemoglobin of 6.2.  Patient was admitted for evaluation of the anemia and was transfused.  Gastroenterology was consulted.  Patient underwent EGD on 12/6.   Assessment and Plan:  Symptomatic anemia Mildly symptomatic with generalized weakness, sporadic dizziness Does report melena-last BM 05/02/2024  -Denies any chest pain or shortness of breath -Hemoglobin noted to be 6.2 last hemoglobin in October 2025 was 11.3 -Transfused with 2U PRBC -Withholding Eliquis  -Vascular would like to add Plavix  -Anemia workup shows low iron of 21 with saturation of 5.  Ferritin not checked. - B12 907, folate 18. -Gastroenterology was consulted.  Concern for GI bleed Patient presented with anemia as noted above. Gastroenterology consulted. Patient underwent EGD on 12/6 which revealed a large duodenal ulcer and no active bleeding. - Per GI recs, will continue to hold Eliquis . - Plan to start on heparin  gtt. challenge on 12/8.  If  patient tolerates this, we will transition to Eliquis  on 12/9  Critical limb ischemia of left lower extremity [facility]) - Vascular team following closely  - Presented to ED on 03/25/24 for acute left limb ischemia, - 12/28 underwent a left lower extremity thrombectomy, and angioplasty. Left SFA stent placement on by Dr. Sheree.  - 12/1  f/up for infection and hematoma, noted worsening wound dehiscence - 12/5 patient was brought back today for debridement in OR and and placement of wound VAC.  -Further management per vascular surgery - Patient Eliquis  has been held since 04/29/2024.  Plan for heparin  GTT to start on 12/8 as indicated above.  ICH (intracerebral hemorrhage) (HCC) Remote history of intracranial hemorrhage -Due to A-fib patient has been anticoagulated on Eliquis , which has been held since 04/29/2024  Atrial fibrillation (HCC) - Rate controlled - Patient currently on home metoprolol , holding diltiazem  on hold. -Patient seen by cardiology.  Per cardiology, patient can cautiously be challenged with heparin  when safe from a GI perspective to ensure no bleeding. - Per cardiology, patient warrants consideration for Watchman device. - Plan to resume home diltiazem  in a.m. if blood pressure holds.  Mixed hyperlipidemia Continue statins - Check LFTs in AM.  Aortic stenosis - Last echo 03/18/2024-reviewed EF 55-60%, normal LV function, mildly enlarged right ventricle, mildly reduced RV function, aortic valve was not well visualized no significant stenosis was reported -Currently asymptomatic, stable - Per cardiology, recommend repeat echocardiogram in the next 1 to 2 months as outpatient for high-quality imaging to reassess severity of aortic valve stenosis.  Essential hypertension, benign - Currently blood pressure stable - Continue home metoprolol . - Plan to resume home diltiazem  in a.m. if blood pressure  allows.  History of stroke without residual deficits - No new focal  neurological findings -Holding Eliquis , continue statins (Vascular like to add Plavix -once hemoglobin stabilized)       Subjective: Patient complains of abdominal pain after his procedure (EGD).  Physical Exam: BP 127/69 (BP Location: Right Arm)   Pulse 97   Temp 98.3 F (36.8 C)   Resp 18   Ht 6' 1 (1.854 m)   Wt 77.6 kg   SpO2 100%   BMI 22.57 kg/m    General: Alert, oriented X3  Eyes: Pupils equal, reactive  Oral cavity: moist mucous membranes  Head: Atraumatic, normocephalic  Neck: supple  Chest: clear to auscultation. No crackles, no wheezes  CVS: S1,S2 RRR. No murmurs  Abd: No distention, soft, non-tender. No masses palpable  Extr: No edema   MSK: Left lower extremity in dressing, wound VAC + Neurological: Grossly intact.    Data Reviewed:    Latest Ref Rng & Units 05/04/2024    5:50 AM 05/03/2024    6:04 PM 05/03/2024    9:33 AM  CBC  WBC 4.0 - 10.5 K/uL 3.3   6.7   Hemoglobin 13.0 - 17.0 g/dL 9.0  9.1  6.2   Hematocrit 39.0 - 52.0 % 28.3  28.7  20.8   Platelets 150 - 400 K/uL 306   359       Latest Ref Rng & Units 05/03/2024    6:04 PM 05/03/2024    9:33 AM 03/28/2024    3:24 AM  BMP  Glucose 70 - 99 mg/dL  96  885   BUN 8 - 23 mg/dL  23  14   Creatinine 9.38 - 1.24 mg/dL 9.28  9.28  9.22   Sodium 135 - 145 mmol/L  131  131   Potassium 3.5 - 5.1 mmol/L  3.8  4.1   Chloride 98 - 111 mmol/L  98  97   CO2 22 - 32 mmol/L  23  24   Calcium  8.9 - 10.3 mg/dL  7.8  8.3      Family Communication: Spoke with patient's wife at bedside  Disposition: Status is: Inpatient Remains inpatient appropriate because: Requires ongoing monitoring of hemoglobin and then will need to have anticoagulation resumed.     Author: MDALA-GAUSI, Keandra Medero AGATHA, MD 05/04/2024 2:56 PM  For on call review [email or web address].

## 2024-05-04 NOTE — Plan of Care (Signed)

## 2024-05-04 NOTE — Progress Notes (Addendum)
 [facility] [place]  Subjective  -no complaints   Objective 134/75 88 98.3 F (36.8 C) (Temporal) 14 96%  Intake/Output Summary (Last 24 hours) at 05/04/2024 1011 Last data filed at 05/04/2024 1005 Gross per 24 hour  Intake 2696.69 ml  Output 600 ml  Net 2096.69 ml    Left calf VAC with good seal  Laboratory Lab Results: Recent Labs    05/03/24 0933 05/03/24 1804 05/04/24 0550  WBC 6.7  --  3.3*  HGB 6.2* 9.1* 9.0*  HCT 20.8* 28.7* 28.3*  PLT 359  --  306   BMET Recent Labs    05/03/24 0933 05/03/24 1804  NA 131*  --   K 3.8  --   CL 98  --   CO2 23  --   GLUCOSE 96  --   BUN 23  --   CREATININE 0.71 0.71  CALCIUM  7.8*  --     COAG Lab Results  Component Value Date   INR 1.2 05/04/2024   INR 1.2 05/03/2024   INR 1.2 02/27/2024   No results found for: PTT  Assessment/Planning:  Postop day 1 status post I&D of left lower extremity surgical wound.  VAC with good seal.  Hemoglobin today 9 after transfusion.  Plan EGD today with GI given hemoglobin 6.2 at presentation with 3 months of black stools.  Next VAC change Monday at bedside.  Kyle Kelley 05/04/2024 10:11 AM --

## 2024-05-04 NOTE — Progress Notes (Signed)
 PT Cancellation Note  Patient Details Name: ARVELL PULSIFER MRN: [id number] DOB: Oct 18, 1945   Cancelled Treatment:    Reason Eval/Treat Not Completed: Patient at procedure or test/unavailable (Pt off the floor at EGD. Will follow up later if time allows.)   Tanekia Ryans 05/04/2024, 9:51 AM

## 2024-05-04 NOTE — Progress Notes (Signed)
 OT Cancellation Note  Patient Details Name: Kyle Kelley MRN: [id number] DOB: [birth year]   Cancelled Treatment:    Reason Eval/Treat Not Completed: Patient at procedure or test/ unavailable (Pt current off unit for EGD. OT will reattempt to see pt at a later time as appropriate/available.)  Margarie Rockey HERO., OTR/L, MA Acute Rehab [phone]   Margarie FORBES Horns 05/04/2024, 9:45 AM

## 2024-05-04 NOTE — Plan of Care (Addendum)
 06:41 Secure EPIC chat with ENDO pre-procedure. Pre op checklist completed, hibiclens  done x1 per order. Informed endo that pt has wound vac to LLE that was placed s/p wound debridement 05/03/2024. OR dressing to LLE still in place. Otherwise pt will be sent in nothing but gown.   Problem: Education: Goal: Knowledge of General Education information will improve Description: Including pain rating scale, medication(s)/side effects and non-pharmacologic comfort measures Outcome: Progressing   Problem: Health Behavior/Discharge Planning: Goal: Ability to manage health-related needs will improve Outcome: Progressing   Problem: Clinical Measurements: Goal: Ability to maintain clinical measurements within normal limits will improve Outcome: Progressing Goal: Will remain free from infection Outcome: Progressing Goal: Diagnostic test results will improve Outcome: Progressing Goal: Respiratory complications will improve Outcome: Progressing Goal: Cardiovascular complication will be avoided Outcome: Progressing   Problem: Activity: Goal: Risk for activity intolerance will decrease Outcome: Progressing   Problem: Nutrition: Goal: Adequate nutrition will be maintained Outcome: Progressing   Problem: Coping: Goal: Level of anxiety will decrease Outcome: Progressing   Problem: Elimination: Goal: Will not experience complications related to bowel motility Outcome: Progressing Goal: Will not experience complications related to urinary retention Outcome: Progressing   Problem: Pain Managment: Goal: General experience of comfort will improve and/or be controlled Outcome: Progressing   Problem: Safety: Goal: Ability to remain free from injury will improve Outcome: Progressing   Problem: Skin Integrity: Goal: Risk for impaired skin integrity will decrease Outcome: Progressing

## 2024-05-04 NOTE — Brief Op Note (Signed)
 05/04/2024  10:15 AM  PATIENT:  Kyle Kelley  78 y.o. male  PRE-OPERATIVE DIAGNOSIS:  Melena, anemia, last dose of Eliquis  05/02/24  POST-OPERATIVE DIAGNOSIS:  gastritis, duodenal ulcers clean base nonbleeding, random gastric biopsies r/o hpylori  PROCEDURE:  Procedure(s): EGD (ESOPHAGOGASTRODUODENOSCOPY) (N/A)  SURGEON:  Surgeons and Role:    * Tashanti Dalporto, MD - Primary  Findings ------------- - EGD showed 15 mm large cratered duodenal ulcer.  No evidence of active bleeding.  Gastritis.  Gastric biopsies taken.  Recommendations --------------------------- - Start soft diet and advance as tolerated - Recommend Protonix  40 mg twice a day for 8 weeks - Okay to resume Eliquis  after 48 hours - Monitor H&H. - No further inpatient GI workup planned.  GI will sign off.  Follow-up in GI clinic in 2 months after discharge.  Layla Lah MD, FACP 05/04/2024, 10:16 AM  Contact #  [phone]

## 2024-05-04 NOTE — Anesthesia Preprocedure Evaluation (Addendum)
 Anesthesia Evaluation  Patient identified by MRN, date of birth, ID band Patient awake    Reviewed: Allergy & Precautions, NPO status , Patient's Chart, lab work & pertinent test results  Airway Mallampati: III  TM Distance: >3 FB Neck ROM: Full    Dental no notable dental hx.    Pulmonary former smoker   Pulmonary exam normal breath sounds clear to auscultation       Cardiovascular hypertension, Pt. on home beta blockers + Peripheral Vascular Disease  Normal cardiovascular exam+ dysrhythmias Atrial Fibrillation  Rhythm:Regular Rate:Normal  ECHO: 1. Left ventricular ejection fraction, by estimation, is 55 to 60%. The  left ventricle has normal function. The left ventricle has no regional  wall motion abnormalities. Left ventricular diastolic parameters are  indeterminate.   2. Right ventricular systolic function is mildly reduced. The right  ventricular size is mildly enlarged.   3. The mitral valve is grossly normal. No evidence of mitral valve  regurgitation.   4. The aortic valve was not well visualized. Aortic valve regurgitation  is not visualized. Aortic valve sclerosis is present, with no evidence of  aortic valve stenosis.   5. The inferior vena cava is normal in size with greater than 50%  respiratory variability, suggesting right atrial pressure of 3 mmHg.     Neuro/Psych  PSYCHIATRIC DISORDERS      CVA    GI/Hepatic negative GI ROS, Neg liver ROS,,,  Endo/Other  negative endocrine ROS    Renal/GU negative Renal ROS     Musculoskeletal negative musculoskeletal ROS (+)    Abdominal   Peds  Hematology  (+) Blood dyscrasia (Eliquis ), anemia   Anesthesia Other Findings Melena Anemia  Reproductive/Obstetrics                              Anesthesia Physical Anesthesia Plan  ASA: 3  Anesthesia Plan: MAC   Post-op Pain Management:    Induction:   PONV Risk Score and  Plan: 1 and Propofol  infusion and Treatment may vary due to age or medical condition  Airway Management Planned: Nasal Cannula  Additional Equipment:   Intra-op Plan:   Post-operative Plan:   Informed Consent: I have reviewed the patients History and Physical, chart, labs and discussed the procedure including the risks, benefits and alternatives for the proposed anesthesia with the patient or authorized representative who has indicated his/her understanding and acceptance.     Dental advisory given  Plan Discussed with: CRNA  Anesthesia Plan Comments:          Anesthesia Quick Evaluation

## 2024-05-05 DIAGNOSIS — D649 Anemia, unspecified: Secondary | ICD-10-CM | POA: Diagnosis not present

## 2024-05-05 LAB — COMPREHENSIVE METABOLIC PANEL WITH GFR
ALT: 21 U/L (ref 0–44)
AST: 29 U/L (ref 15–41)
Albumin: 2.1 g/dL — ABNORMAL LOW (ref 3.5–5.0)
Alkaline Phosphatase: 49 U/L (ref 38–126)
Anion gap: 9 (ref 5–15)
BUN: 28 mg/dL — ABNORMAL HIGH (ref 8–23)
CO2: 19 mmol/L — ABNORMAL LOW (ref 22–32)
Calcium: 7.8 mg/dL — ABNORMAL LOW (ref 8.9–10.3)
Chloride: 103 mmol/L (ref 98–111)
Creatinine, Ser: 0.8 mg/dL (ref 0.61–1.24)
GFR, Estimated: >60 mL/min (ref >60)
Glucose, Bld: 102 mg/dL — ABNORMAL HIGH (ref 70–99)
Potassium: 5.2 mmol/L — ABNORMAL HIGH (ref 3.5–5.1)
Sodium: 131 mmol/L — ABNORMAL LOW (ref 135–145)
Total Bilirubin: 0.6 mg/dL (ref 0.0–1.2)
Total Protein: 5.3 g/dL — ABNORMAL LOW (ref 6.5–8.1)

## 2024-05-05 LAB — CBC
HCT: 41.1 % (ref 39.0–52.0)
HCT: 39.9 % (ref 39.0–52.0)
HCT: 38.1 % — ABNORMAL LOW (ref 39.0–52.0)
Hemoglobin: 13 g/dL (ref 13.0–17.0)
Hemoglobin: 12.6 g/dL — ABNORMAL LOW (ref 13.0–17.0)
Hemoglobin: 12.1 g/dL — ABNORMAL LOW (ref 13.0–17.0)
MCH: 28.9 pg (ref 26.0–34.0)
MCH: 28.6 pg (ref 26.0–34.0)
MCH: 28.3 pg (ref 26.0–34.0)
MCHC: 31.6 g/dL (ref 30.0–36.0)
MCHC: 31.6 g/dL (ref 30.0–36.0)
MCHC: 31.8 g/dL (ref 30.0–36.0)
MCV: 91.3 fL (ref 80.0–100.0)
MCV: 90.5 fL (ref 80.0–100.0)
MCV: 89.2 fL (ref 80.0–100.0)
Platelets: 326 K/uL (ref 150–400)
Platelets: 332 K/uL (ref 150–400)
Platelets: 242 K/uL (ref 150–400)
RBC: 4.5 MIL/uL (ref 4.22–5.81)
RBC: 4.41 MIL/uL (ref 4.22–5.81)
RBC: 4.27 MIL/uL (ref 4.22–5.81)
RDW: 15.8 % — ABNORMAL HIGH (ref 11.5–15.5)
RDW: 15.8 % — ABNORMAL HIGH (ref 11.5–15.5)
RDW: 15.8 % — ABNORMAL HIGH (ref 11.5–15.5)
WBC: 23.6 K/uL — ABNORMAL HIGH (ref 4.0–10.5)
WBC: 26.1 K/uL — ABNORMAL HIGH (ref 4.0–10.5)
WBC: 28.5 K/uL — ABNORMAL HIGH (ref 4.0–10.5)
nRBC: 1.1 % — ABNORMAL HIGH (ref 0.0–0.2)
nRBC: 1.1 % — ABNORMAL HIGH (ref 0.0–0.2)
nRBC: 0.3 % — ABNORMAL HIGH (ref 0.0–0.2)

## 2024-05-05 LAB — TSH: TSH: 2.963 u[IU]/mL (ref 0.350–4.500)

## 2024-05-05 LAB — BASIC METABOLIC PANEL WITH GFR
Anion gap: 11 (ref 5–15)
BUN: 29 mg/dL — ABNORMAL HIGH (ref 8–23)
CO2: 20 mmol/L — ABNORMAL LOW (ref 22–32)
Calcium: 7.9 mg/dL — ABNORMAL LOW (ref 8.9–10.3)
Chloride: 102 mmol/L (ref 98–111)
Creatinine, Ser: 0.97 mg/dL (ref 0.61–1.24)
GFR, Estimated: >60 mL/min (ref >60)
Glucose, Bld: 115 mg/dL — ABNORMAL HIGH (ref 70–99)
Potassium: 5 mmol/L (ref 3.5–5.1)
Sodium: 133 mmol/L — ABNORMAL LOW (ref 135–145)

## 2024-05-05 LAB — LACTIC ACID, PLASMA
Lactic Acid, Venous: 2.7 mmol/L (ref 0.5–1.9)
Lactic Acid, Venous: 3.7 mmol/L (ref 0.5–1.9)
Lactic Acid, Venous: 3.4 mmol/L (ref 0.5–1.9)
Lactic Acid, Venous: 2.6 mmol/L (ref 0.5–1.9)

## 2024-05-05 LAB — GLUCOSE, CAPILLARY: Glucose-Capillary: 105 mg/dL — ABNORMAL HIGH (ref 70–99)

## 2024-05-05 MED ORDER — METOPROLOL TARTRATE 25 MG PO TABS
25.0000 mg | ORAL_TABLET | Freq: Once | ORAL | Status: AC
  Administered 2024-05-05: 25 mg via ORAL
  Filled 2024-05-05: qty 1

## 2024-05-05 MED ORDER — LACTATED RINGERS IV BOLUS
500.0000 mL | Freq: Once | INTRAVENOUS | Status: AC
  Administered 2024-05-05: 500 mL via INTRAVENOUS

## 2024-05-05 MED ORDER — LACTATED RINGERS IV SOLN: INTRAVENOUS | Status: AC

## 2024-05-05 NOTE — Evaluation (Signed)
 Physical Therapy Evaluation Patient Details Name: Kyle Kelley MRN: [id number] DOB: 07-29-1945 Today's Date: 05/05/2024  History of Present Illness  Kyle Kelley is a 78 y.o. male admitted 05/03/24 with symptomatic anemia. He has been followed closely by vascular team outpatient and at visit 12/1 noted worsening wound dehiscence brought in 12/5 for debridement in OR and placement of wound VAC. Pt underwent EGD 12/6, which showed gastritis and non-bleeding duodenal ulcers . PMHx: atrial fibrillation (on Eliquis /recently held), vasculopath, acute on chronic ischemic LLE nonhealing wound, HTN, HLD, aortic stenosis, CVA, prostate cancer s/p prostatectomy 2014, and PVD.   Clinical Impression  Pt admitted with above diagnosis. PTA, pt was independent with functional mobility without AD and modI for ADLs/IADLs. He lives with his wife in a one story house with 3 STE. Pt currently with functional limitations due to the deficits listed below (see PT Problem List). He required CGA for sit<>stand using RW and CGA for gait using RW. Pt ambulated a household distance with a step-through gait pattern. Pt is currently limited by soreness, wound vac, decreased strength, and decreased balance. Pt will benefit from acute skilled PT to increase his independence and safety with mobility to allow discharge. Recommend HHPT to increase strength, improve balance, decrease fall risk, and optimize safety within the home environment.      If plan is discharge home, recommend the following: A little help with walking and/or transfers;A little help with bathing/dressing/bathroom;Assistance with cooking/housework;Assist for transportation;Help with stairs or ramp for entrance   Can travel by private vehicle        Equipment Recommendations None recommended by PT  Recommendations for Other Services       Functional Status Assessment Patient has had a recent decline in their functional status and demonstrates the ability to  make significant improvements in function in a reasonable and predictable amount of time.     Precautions / Restrictions Precautions Precautions: Fall Recall of Precautions/Restrictions: Intact Precaution/Restrictions Comments: Wound Vac LLE Restrictions Weight Bearing Restrictions Per Provider Order: No      Mobility  Bed Mobility               General bed mobility comments: Not assessed. Pt greeted seated in recliner chair and returned there at end of session.    Transfers Overall transfer level: Needs assistance Equipment used: Rolling walker (2 wheels) Transfers: Sit to/from Stand Sit to Stand: Contact guard assist           General transfer comment: Pt stood from recliner chair. Cued proper hand placement using RW. Powered up with CGA. Good eccentric control.    Ambulation/Gait Ambulation/Gait assistance: Contact guard assist, +2 safety/equipment (chair follow (not needed)) Gait Distance (Feet): 80 Feet Assistive device: Rolling walker (2 wheels) Gait Pattern/deviations: Step-through pattern, Decreased stride length, Trunk flexed   Gait velocity interpretation: 1.31 - 2.62 ft/sec, indicative of limited community ambulator   General Gait Details: Pt ambulated with a reciprocal gait pattern, even weight shift, and good foot clearence. He demonstrated fwd lean with BUE straight infront of him. Cued closer proximity to RW, bent elbows, and upright posture. Pt navigated room/hallway well. PT managed wound vac.  Stairs            Wheelchair Mobility     Tilt Bed    Modified Rankin (Stroke Patients Only)       Balance Overall balance assessment: Needs assistance Sitting-balance support: Bilateral upper extremity supported, Feet supported Sitting balance-Leahy Scale: Fair     Standing  balance support: Bilateral upper extremity supported, During functional activity, Reliant on assistive device for balance Standing balance-Leahy Scale: Poor Standing  balance comment: Pt dependent on RW                             Pertinent Vitals/Pain Pain Assessment Pain Assessment: Faces Faces Pain Scale: Hurts little more Pain Location: Abdomen Pain Descriptors / Indicators: Sore Pain Intervention(s): Monitored during session, Repositioned    Home Living Family/patient expects to be discharged to:: Private residence Living Arrangements: Spouse/significant other Available Help at Discharge: Family;Available 24 hours/day (Wife 24/7) Type of Home: House Home Access: Stairs to enter Entrance Stairs-Rails: Left Entrance Stairs-Number of Steps: 3 (into the kitchen from the back door)   Home Layout: One level Home Equipment: Agricultural Consultant (2 wheels);Cane - single point;Grab bars - tub/shower;Hand held shower head;Grab bars - toilet      Prior Function Prior Level of Function : Independent/Modified Independent             Mobility Comments: Indep, progressed from using RW to no AD. Denies fall hx in the past 13mo. ADLs Comments: Pt reports sponge baths secondary to LLE wound. Previously able to get into the shower, holidng onto the bars to step in, and bath himself. Gets dressed sitting/standing. Wears briefs at home d/t worry of incontinence. Manages his own medicaitons. Hasn't driven since the stroke.     Extremity/Trunk Assessment   Upper Extremity Assessment Upper Extremity Assessment: Defer to OT evaluation    Lower Extremity Assessment Lower Extremity Assessment: RLE deficits/detail;LLE deficits/detail RLE Deficits / Details: Hip strength 3+/5; Knee and ankle strength 4/5. Pt with 2+ pitting edema. His entire leg feels cold to touch. RLE Sensation: WNL RLE Coordination: decreased gross motor LLE Deficits / Details: Hip strength 3+/5; Knee and ankle strength 4/5. Pt with 1+ pitting edema. His entire leg feels cold to touch. LLE Sensation: WNL LLE Coordination: decreased gross motor    Cervical / Trunk  Assessment Cervical / Trunk Assessment: Normal  Communication   Communication Communication: Impaired Factors Affecting Communication: Hearing impaired    Cognition Arousal: Alert Behavior During Therapy: WFL for tasks assessed/performed   PT - Cognitive impairments: Problem solving, Safety/Judgement                       PT - Cognition Comments: Pt A,Ox4. He appears to have delayed processing. Required moderate cues to improve safety awareness during mobility. Following commands: Impaired Following commands impaired: Follows one step commands with increased time     Cueing Cueing Techniques: Verbal cues, Gestural cues     General Comments General comments (skin integrity, edema, etc.): VSS on RA    Exercises     Assessment/Plan    PT Assessment Patient needs continued PT services  PT Problem List Decreased strength;Decreased activity tolerance;Decreased balance;Decreased mobility;Decreased safety awareness;Decreased skin integrity       PT Treatment Interventions DME instruction;Gait training;Stair training;Functional mobility training;Therapeutic activities;Therapeutic exercise;Balance training;Patient/family education    PT Goals (Current goals can be found in the Care Plan section)  Acute Rehab PT Goals Patient Stated Goal: Return Home PT Goal Formulation: With patient/family Time For Goal Achievement: 05/19/24 Potential to Achieve Goals: Good    Frequency Min 2X/week     Co-evaluation               AM-PAC PT 6 Clicks Mobility  Outcome Measure Help needed turning from your back  to your side while in a flat bed without using bedrails?: A Little Help needed moving from lying on your back to sitting on the side of a flat bed without using bedrails?: A Little Help needed moving to and from a bed to a chair (including a wheelchair)?: A Little Help needed standing up from a chair using your arms (e.g., wheelchair or bedside chair)?: A Little Help  needed to walk in hospital room?: A Little Help needed climbing 3-5 steps with a railing? : A Lot 6 Click Score: 17    End of Session Equipment Utilized During Treatment: Gait belt Activity Tolerance: Patient tolerated treatment well Patient left: in chair;with call bell/phone within reach;with family/visitor present Nurse Communication: Mobility status PT Visit Diagnosis: Difficulty in walking, not elsewhere classified (R26.2);Other abnormalities of gait and mobility (R26.89)    Time: 8842-8772 PT Time Calculation (min) (ACUTE ONLY): 30 min   Charges:   PT Evaluation $PT Eval Moderate Complexity: 1 Mod PT Treatments $Gait Training: 8-22 mins PT General Charges $$ ACUTE PT VISIT: 1 Visit         Randall SAUNDERS, PT, DPT Acute Rehabilitation Services Office: [phone] Secure Chat Preferred  Delon CHRISTELLA Callander 05/05/2024, 1:41 PM

## 2024-05-05 NOTE — Progress Notes (Signed)
 [facility] [place]  Subjective  -no complaints   Objective 109/78 (!) 108 (!) 97.5 F (36.4 C) 19 96%  Intake/Output Summary (Last 24 hours) at 05/05/2024 0942 Last data filed at 05/04/2024 1739 Gross per 24 hour  Intake 336 ml  Output 400 ml  Net -64 ml    Left calf VAC with good seal  Laboratory Lab Results: Recent Labs    05/04/24 0550 05/05/24 0606  WBC 3.3* 23.6*  HGB 9.0* 13.0  HCT 28.3* 41.1  PLT 306 326   BMET Recent Labs    05/03/24 0933 05/03/24 1804 05/05/24 0606  NA 131*  --  131*  K 3.8  --  5.2*  CL 98  --  103  CO2 23  --  19*  GLUCOSE 96  --  102*  BUN 23  --  28*  CREATININE 0.71 0.71 0.80  CALCIUM  7.8*  --  7.8*    COAG Lab Results  Component Value Date   INR 1.2 05/04/2024   INR 1.2 05/03/2024   INR 1.2 02/27/2024   No results found for: PTT  Assessment/Planning:  Postop day 2 status post I&D of left lower extremity surgical wound.  VAC with good seal.  Noted EGD yesterday with 15 mm duodenal ulcer and okay to restart Eliquis  after 48 hours.  Plan vac change at bedside tomorrow.  Kyle Kelley 05/05/2024 9:42 AM --

## 2024-05-05 NOTE — Progress Notes (Signed)
 Progress Note   Patient: Kyle Kelley FMW:[id number] DOB: July 09, 1945 DOA: 05/03/2024     2 DOS: the patient was seen and examined on 05/05/2024    Brief hospital course: Kyle Kelley is a 78 year old male with extensive history of atrial fibrillation (on Eliquis /recently held), vasculopath, with acute on chronic ischemic left lower extremity nonhealing wound, HTN, HLD, Aortic stenosis, CVA, prostate cancer s/p prostatectomy 2014 who presented on 12/5 to the vascular team for follow-up left lower extremity wound.   It appears the patient presented to ED on 03/25/2024 for acute left limb ischemia, vascular was consulted underwent a left lower extremity thrombectomy, and angioplasty.,  Left SFA stent placement on 05/26/2024 by Dr. Sheree. Per vascular surgery on 04/29/2024 follow-up visit was noted worsening wound dehiscence-patient was brought back 05/03/2024 for debridement in OR and placement of wound VAC. Patient Eliquis  has been held since 04/29/2024.  Labs on 12/5 revealed hemoglobin of 6.2.  Patient was admitted for evaluation of the anemia and was transfused.  Gastroenterology was consulted.  Patient underwent EGD on 12/6.   Assessment and Plan:  Lactic acidosis LA of 2.7 on 12/17. Likely due to volume depletion. - IV fluids.  Leukocytosis Patient has had a bump in his white cell count from 3.3. A.m. labs showed white cell count of 23.6.  This was repeated and was 26.1. Patient denies any new symptoms apart from the abdominal pain he has had since his procedure in which he attributes to the biopsies. He denies headache, fever, cough. No dysuria. - In the absence of clear focus of infection, will hold off antibiotics and will continue to monitor.  Duodenal ulcer GI bleed Patient presented with anemia and melena. Gastroenterology consulted. Patient underwent EGD on 12/6 which revealed a large duodenal ulcer and no active bleeding. - Per GI recs, will continue to hold  anticoagulation until 12/8. - Plan to start on heparin  gtt. challenge on 12/8.  If patient tolerates this, we will transition to Eliquis  on 12/9   Symptomatic anemia -Mildly symptomatic with generalized weakness, sporadic dizziness -Due to GI bleed as noted above.  Gastroenterology was consulted and patient underwent EGD. -Denies any chest pain or shortness of breath -Hemoglobin noted to be 6.2 last hemoglobin in October 2025 was 11.3 -Transfused with 2U PRBC -Withholding Eliquis  -Vascular would like to add Plavix  -Anemia workup shows low iron of 21 with saturation of 5.  Ferritin not checked. - B12 907, folate 18.  Critical limb ischemia of left lower extremity (HCC) - Vascular team following closely  - Presented to ED on 03/25/24 for acute left limb ischemia, - 12/28 underwent a left lower extremity thrombectomy, and angioplasty. Left SFA stent placement on by Dr. Sheree.  - 12/1  f/up for infection and hematoma, noted worsening wound dehiscence - 12/5 patient was brought back today for debridement in OR and and placement of wound VAC.  -Further management per vascular surgery - Patient Eliquis  has been held since 04/29/2024.  Plan for heparin  GTT to start on 12/8 as indicated above.  ICH (intracerebral hemorrhage) (HCC) Remote history of intracranial hemorrhage -Due to A-fib patient has been anticoagulated on Eliquis , which has been held since 04/29/2024  Atrial fibrillation (HCC) - Rate controlled - Patient currently on home metoprolol , holding diltiazem  on hold. -Patient seen by cardiology.  Per cardiology, patient can cautiously be challenged with heparin  when safe from a GI perspective to ensure no bleeding.  Will start heparin  drip on 12/8. - Per cardiology, patient warrants consideration  for Watchman device. - Plan to resume home diltiazem  in a.m. if blood pressure holds.  Mixed hyperlipidemia LFTs within normal limits. -Continue home statin.  Aortic stenosis - Last echo  03/18/2024-reviewed EF 55-60%, normal LV function, mildly enlarged right ventricle, mildly reduced RV function, aortic valve was not well visualized no significant stenosis was reported -Currently asymptomatic, stable - Per cardiology, recommend repeat echocardiogram in the next 1 to 2 months as outpatient for high-quality imaging to reassess severity of aortic valve stenosis.  Essential hypertension, benign - Currently blood pressure stable - Continue home metoprolol . - Plan to resume home diltiazem  when blood pressure allows.  History of stroke without residual deficits - No new focal neurological findings -Holding Eliquis , continue statins (Vascular like to add Plavix -once hemoglobin stabilized)       Subjective: Patient complains of abdominal soreness.  Says he makes a distinction between pain and soreness, pain being sharp and soreness being more dull.  He attributes the soreness to the procedure he had yesterday, stating its only started after the procedure (EGD with duodenal ulcer that was biopsied).   Physical Exam: BP 98/69 (BP Location: Right Arm)   Pulse (!) 106   Temp 97.8 F (36.6 C)   Resp 20   Ht 6' 1 (1.854 m)   Wt 77.6 kg   SpO2 94%   BMI 22.57 kg/m    General: Alert, oriented X3  Eyes: Pupils equal, reactive  Oral cavity: dry mucous membranes  Head: Atraumatic, normocephalic  Neck: supple  Chest: clear to auscultation. No crackles, no wheezes  CVS: S1,S2 RRR. No murmurs  Abd: No distention, soft, non-tender. No masses palpable  Extr: No edema   MSK: Left lower extremity with wound VAC + Neurological: Grossly intact.    Data Reviewed:    Latest Ref Rng & Units 05/05/2024   10:51 AM 05/05/2024    6:06 AM 05/04/2024    5:50 AM  CBC  WBC 4.0 - 10.5 K/uL 26.1  23.6  3.3   Hemoglobin 13.0 - 17.0 g/dL 87.3  86.9  9.0   Hematocrit 39.0 - 52.0 % 39.9  41.1  28.3   Platelets 150 - 400 K/uL 332  326  306       Latest Ref Rng & Units 05/05/2024    6:06 AM  05/03/2024    6:04 PM 05/03/2024    9:33 AM  BMP  Glucose 70 - 99 mg/dL 897   96   BUN 8 - 23 mg/dL 28   23   Creatinine 9.38 - 1.24 mg/dL 9.19  9.28  9.28   Sodium 135 - 145 mmol/L 131   131   Potassium 3.5 - 5.1 mmol/L 5.2   3.8   Chloride 98 - 111 mmol/L 103   98   CO2 22 - 32 mmol/L 19   23   Calcium  8.9 - 10.3 mg/dL 7.8   7.8      Family Communication: Spoke with patient's wife at bedside  Disposition: Status is: Inpatient Remains inpatient appropriate because: Requires ongoing monitoring of hemoglobin and then will need to have anticoagulation resumed.     Author: MDALA-GAUSI, Keshonda Monsour AGATHA, MD 05/05/2024 1:18 PM  For on call review [email or web address].

## 2024-05-05 NOTE — Evaluation (Signed)
 Occupational Therapy Evaluation Patient Details Name: Kyle Kelley MRN: [id number] DOB: [birth year] Today's Date: 05/05/2024   History of Present Illness   Kyle Kelley is a 78 y.o. male admitted 05/03/24 with symptomatic anemia. He has been followed closely by vascular team outpatient and at visit 12/1 noted worsening wound dehiscence brought in 12/5 for debridement in OR and placement of wound VAC. Pt underwent EGD 12/6, which showed  gastritis, duodenal ulcers clean base nonbleeding, random gastric biopsies r/o h-pylori. PMHx: atrial fibrillation (on Eliquis /recently held), vasculopath, acute on chronic ischemic LLE nonhealing wound, HTN, HLD, aortic stenosis, CVA, prostate cancer s/p prostatectomy 2014, and PVD.     Clinical Impressions Pt seen for OT eval this PM. PTA, pt was indep with ADLs and  mobility with RW (recently due to LLE wound/pain). Today, he presents with generalized weakness and reduced flexibility in LLE. Per PT and patient, pt and wife with several questions re: wound vac mgmt relative to LB ADLs/toileting. Wife not present at time of OT visit today. Extensive time spent educating pt on compensatory techniques, pt requesting another visit for further reinforcement and practice with wife present. Overall, he was CGA for functional transfers and mobility, min A to stand from toilet 2/2 lower surface height. Max A for brief mgmt through wound vac and CGA for standing UB ADLs.  Pt is currently functioning below baseline and would benefit from ongoing acute OT services to progress towards safe discharge and to facilitate return to prior level of function. Current recommendation is home with home health OT.     If plan is discharge home, recommend the following:   A little help with walking and/or transfers;A little help with bathing/dressing/bathroom;Assistance with cooking/housework;Assist for transportation;Help with stairs or ramp for entrance     Functional Status  Assessment   Patient has had a recent decline in their functional status and demonstrates the ability to make significant improvements in function in a reasonable and predictable amount of time.     Equipment Recommendations   None recommended by OT     Recommendations for Other Services   PT consult     Precautions/Restrictions   Precautions Precautions: Fall Recall of Precautions/Restrictions: Intact Precaution/Restrictions Comments: wound vac LLE Restrictions Weight Bearing Restrictions Per Provider Order: No     Mobility Bed Mobility               General bed mobility comments: not assessed - pt OOB throughout session    Transfers Overall transfer level: Needs assistance Equipment used: Rolling walker (2 wheels) Transfers: Sit to/from Stand Sit to Stand: Contact guard assist           General transfer comment: incr time to come to stance, demo'd good hand placement      Balance Overall balance assessment: Needs assistance Sitting-balance support: Bilateral upper extremity supported, Feet supported Sitting balance-Leahy Scale: Fair Sitting balance - Comments: seated unsupported from back rest in chair   Standing balance support: Bilateral upper extremity supported, During functional activity, Reliant on assistive device for balance Standing balance-Leahy Scale: Poor Standing balance comment: reliant on RW                           ADL either performed or assessed with clinical judgement   ADL Overall ADL's : Needs assistance/impaired Eating/Feeding: Independent   Grooming: Contact guard assist;Standing           Upper Body Dressing : Set up  Lower Body Dressing: Maximal assistance;Sitting/lateral leans Lower Body Dressing Details (indicate cue type and reason): donned brief, assist to thread wound vac through pants hole, cues for compensatory technique Toilet Transfer: Minimal assistance;Ambulation;Regular Toilet;Rolling  walker (2 wheels)   Toileting- Clothing Manipulation and Hygiene: Supervision/safety;Sitting/lateral lean;Sit to/from stand       Functional mobility during ADLs: Contact guard assist;Rolling walker (2 wheels)       Vision Baseline Vision/History: 0 No visual deficits Ability to See in Adequate Light: 0 Adequate Patient Visual Report: No change from baseline       Perception         Praxis         Pertinent Vitals/Pain Pain Assessment Pain Assessment: 0-10 Faces Pain Scale: Hurts a little bit Pain Location: abd Pain Descriptors / Indicators: Sore Pain Intervention(s): Limited activity within patient's tolerance, Monitored during session     Extremity/Trunk Assessment Upper Extremity Assessment Upper Extremity Assessment: Generalized weakness   Lower Extremity Assessment Lower Extremity Assessment: Defer to PT evaluation   Cervical / Trunk Assessment Cervical / Trunk Assessment: Normal   Communication Communication Communication: Impaired Factors Affecting Communication: Hearing impaired   Cognition Arousal: Alert Behavior During Therapy: WFL for tasks assessed/performed Cognition: No apparent impairments             OT - Cognition Comments: highly participatory and pleasant                 Following commands: Impaired Following commands impaired: Follows one step commands with increased time     Cueing  General Comments   Cueing Techniques: Verbal cues;Gestural cues  VSS on RA   Exercises     Shoulder Instructions      Home Living Family/patient expects to be discharged to:: Private residence Living Arrangements: Spouse/significant other Available Help at Discharge: Family;Available 24 hours/day (wife - 24/7 assist) Type of Home: House Home Access: Stairs to enter [employer] of Steps: 3 (into kitchen from back door) Entrance Stairs-Rails: Left Home Layout: One level     Bathroom Shower/Tub: Scientist, [employer]): Standard Bathroom Accessibility: Yes (RW, not a w/c)   Home Equipment: Rolling Walker (2 wheels);Cane - single point;Grab bars - tub/shower;Hand held shower head;Grab bars - toilet          Prior Functioning/Environment Prior Level of Function : Independent/Modified Independent             Mobility Comments: indep, recently progressed from using RW to no AD, denies falls hx in past 6 months ADLs Comments: Pt reports sponge baths secondary to LLE wound. Previously able to get into the shower, holidng onto the bars to step in, and bath himself. Gets dressed sitting/standing. Wears briefs at home d/t worry of incontinence. Manages his own medicaitons. Hasn't driven since the stroke.    OT Problem List: Decreased strength;Decreased activity tolerance;Impaired balance (sitting and/or standing)   OT Treatment/Interventions: Self-care/ADL training;Energy conservation;DME and/or AE instruction;Therapeutic activities;Patient/family education;Balance training      OT Goals(Current goals can be found in the care plan section)   Acute Rehab OT Goals Patient Stated Goal: get better OT Goal Formulation: With patient Time For Goal Achievement: 05/19/24 Potential to Achieve Goals: Good   OT Frequency:  Min 2X/week    Co-evaluation              AM-PAC OT 6 Clicks Daily Activity     Outcome Measure Help from another person eating meals?: None Help from another person taking care  of personal grooming?: A Little Help from another person toileting, which includes using toliet, bedpan, or urinal?: A Lot Help from another person bathing (including washing, rinsing, drying)?: A Lot Help from another person to put on and taking off regular upper body clothing?: A Little Help from another person to put on and taking off regular lower body clothing?: A Lot 6 Click Score: 16   End of Session Equipment Utilized During Treatment: Gait belt;Rolling walker (2 wheels) Nurse  Communication: Mobility status;Other (comment) (pt status)  Activity Tolerance: Patient tolerated treatment well Patient left: in chair;with call bell/phone within reach  OT Visit Diagnosis: Unsteadiness on feet (R26.81);Other abnormalities of gait and mobility (R26.89);Muscle weakness (generalized) (M62.81)                Time: 8545-8465 OT Time Calculation (min): 40 min Charges:  OT General Charges $OT Visit: 1 Visit OT Evaluation $OT Eval Moderate Complexity: 1 Mod OT Treatments $Self Care/Home Management : 23-37 mins  Venecia Mehl M. [place], OTR/L [facility] Acute Rehabilitation Services [phone] Secure Chat Preferred  Fontaine Hehl 05/05/2024, 5:32 PM

## 2024-05-05 NOTE — Progress Notes (Signed)
 EGD yesterday demonstrated a 15 mm large cratered duodenal ulcer with no evidence of active bleeding as well as gastritis.  Now on Protonix  40 mg every 8 weeks.  Per GI okay to restart Eliquis  in 48 hours.  No further recommendations from cardiology.  Will get in with EP to evaluate for possible Watchman device.  Cardiology will sign off

## 2024-05-06 ENCOUNTER — Encounter (HOSPITAL_COMMUNITY): Payer: Self-pay | Admitting: Gastroenterology

## 2024-05-06 ENCOUNTER — Inpatient Hospital Stay (HOSPITAL_COMMUNITY)

## 2024-05-06 LAB — BASIC METABOLIC PANEL WITH GFR
Anion gap: 6 (ref 5–15)
BUN: 34 mg/dL — ABNORMAL HIGH (ref 8–23)
CO2: 23 mmol/L (ref 22–32)
Calcium: 7.5 mg/dL — ABNORMAL LOW (ref 8.9–10.3)
Chloride: 99 mmol/L (ref 98–111)
Creatinine, Ser: 0.88 mg/dL (ref 0.61–1.24)
GFR, Estimated: >60 mL/min (ref >60)
Glucose, Bld: 146 mg/dL — ABNORMAL HIGH (ref 70–99)
Potassium: 5 mmol/L (ref 3.5–5.1)
Sodium: 128 mmol/L — ABNORMAL LOW (ref 135–145)

## 2024-05-06 LAB — CBC
HCT: 36.3 % — ABNORMAL LOW (ref 39.0–52.0)
HCT: 34.8 % — ABNORMAL LOW (ref 39.0–52.0)
Hemoglobin: 11.7 g/dL — ABNORMAL LOW (ref 13.0–17.0)
Hemoglobin: 11.1 g/dL — ABNORMAL LOW (ref 13.0–17.0)
MCH: 28.7 pg (ref 26.0–34.0)
MCH: 28.8 pg (ref 26.0–34.0)
MCHC: 32.2 g/dL (ref 30.0–36.0)
MCHC: 31.9 g/dL (ref 30.0–36.0)
MCV: 89.2 fL (ref 80.0–100.0)
MCV: 90.2 fL (ref 80.0–100.0)
Platelets: 254 K/uL (ref 150–400)
Platelets: 232 K/uL (ref 150–400)
RBC: 4.07 MIL/uL — ABNORMAL LOW (ref 4.22–5.81)
RBC: 3.86 MIL/uL — ABNORMAL LOW (ref 4.22–5.81)
RDW: 15.9 % — ABNORMAL HIGH (ref 11.5–15.5)
RDW: 15.9 % — ABNORMAL HIGH (ref 11.5–15.5)
WBC: 24.9 K/uL — ABNORMAL HIGH (ref 4.0–10.5)
WBC: 27.2 K/uL — ABNORMAL HIGH (ref 4.0–10.5)
nRBC: 0.2 % (ref 0.0–0.2)
nRBC: 0.2 % (ref 0.0–0.2)

## 2024-05-06 LAB — HEPARIN LEVEL (UNFRACTIONATED): Heparin Unfractionated: 0.2 [IU]/mL — ABNORMAL LOW (ref 0.30–0.70)

## 2024-05-06 LAB — COMPREHENSIVE METABOLIC PANEL WITH GFR
ALT: 19 U/L (ref 0–44)
AST: 21 U/L (ref 15–41)
Albumin: 1.8 g/dL — ABNORMAL LOW (ref 3.5–5.0)
Alkaline Phosphatase: 47 U/L (ref 38–126)
Anion gap: 8 (ref 5–15)
BUN: 33 mg/dL — ABNORMAL HIGH (ref 8–23)
CO2: 24 mmol/L (ref 22–32)
Calcium: 7.9 mg/dL — ABNORMAL LOW (ref 8.9–10.3)
Chloride: 100 mmol/L (ref 98–111)
Creatinine, Ser: 0.91 mg/dL (ref 0.61–1.24)
GFR, Estimated: >60 mL/min (ref >60)
Glucose, Bld: 100 mg/dL — ABNORMAL HIGH (ref 70–99)
Potassium: 5.1 mmol/L (ref 3.5–5.1)
Sodium: 132 mmol/L — ABNORMAL LOW (ref 135–145)
Total Bilirubin: 0.6 mg/dL (ref 0.0–1.2)
Total Protein: 4.9 g/dL — ABNORMAL LOW (ref 6.5–8.1)

## 2024-05-06 LAB — GLUCOSE, CAPILLARY
Glucose-Capillary: 78 mg/dL (ref 70–99)
Glucose-Capillary: 127 mg/dL — ABNORMAL HIGH (ref 70–99)

## 2024-05-06 LAB — MAGNESIUM: Magnesium: 2.2 mg/dL (ref 1.7–2.4)

## 2024-05-06 LAB — LACTIC ACID, PLASMA: Lactic Acid, Venous: 1.5 mmol/L (ref 0.5–1.9)

## 2024-05-06 MED ORDER — METOPROLOL TARTRATE 5 MG/5ML IV SOLN
5.0000 mg | Freq: Four times a day (QID) | INTRAVENOUS | Status: DC
  Administered 2024-05-07 – 2024-05-13 (×25): 5 mg via INTRAVENOUS
  Filled 2024-05-06 (×26): qty 5

## 2024-05-06 MED ORDER — SODIUM CHLORIDE 0.9 % IV SOLN: INTRAVENOUS | Status: DC

## 2024-05-06 MED ORDER — PANTOPRAZOLE SODIUM 40 MG PO TBEC
40.0000 mg | DELAYED_RELEASE_TABLET | Freq: Two times a day (BID) | ORAL | Status: DC
  Administered 2024-05-06 (×2): 40 mg via ORAL
  Filled 2024-05-06 (×2): qty 1

## 2024-05-06 MED ORDER — DILTIAZEM HCL 60 MG PO TABS
60.0000 mg | ORAL_TABLET | Freq: Four times a day (QID) | ORAL | Status: DC
  Administered 2024-05-06 (×3): 60 mg via ORAL
  Filled 2024-05-06 (×5): qty 1

## 2024-05-06 MED ORDER — PANTOPRAZOLE SODIUM 40 MG IV SOLR
40.0000 mg | Freq: Two times a day (BID) | INTRAVENOUS | Status: DC
  Administered 2024-05-07 – 2024-05-13 (×13): 40 mg via INTRAVENOUS
  Filled 2024-05-06 (×13): qty 10

## 2024-05-06 MED ORDER — HEPARIN (PORCINE) 25000 UT/250ML-% IV SOLN
950.0000 [IU]/h | INTRAVENOUS | Status: DC
  Administered 2024-05-06: 800 [IU]/h via INTRAVENOUS
  Filled 2024-05-06: qty 250

## 2024-05-06 MED ORDER — PIPERACILLIN-TAZOBACTAM 3.375 G IVPB
3.3750 g | Freq: Three times a day (TID) | INTRAVENOUS | Status: DC
  Administered 2024-05-07 – 2024-05-13 (×21): 3.375 g via INTRAVENOUS
  Filled 2024-05-06 (×21): qty 50

## 2024-05-06 MED ORDER — PANTOPRAZOLE SODIUM 40 MG IV SOLR: 40.0000 mg | Freq: Two times a day (BID) | INTRAVENOUS | Status: DC

## 2024-05-06 NOTE — Plan of Care (Signed)

## 2024-05-06 NOTE — Progress Notes (Signed)
 Care d/w primary attending.  Given small volume pneumoperitoneum and clinical stability after recent EGD, recommend non-operative management currently. NPO, NGT decompression, and hold heparin  gtt. Full consult to follow.  Dreama GEANNIE Hanger, MD General and Trauma Surgery [facility] Surgery

## 2024-05-06 NOTE — Progress Notes (Addendum)
 Progress Note   Patient: Kyle Kelley FMW:[id number] DOB: [birth year] DOA: 05/03/2024     3 DOS: the patient was seen and examined on 05/06/2024    Brief hospital course: Kyle Kelley is a 77 year old male with extensive history of atrial fibrillation (on Eliquis /recently held), vasculopath, with acute on chronic ischemic left lower extremity nonhealing wound, HTN, HLD, Aortic stenosis, CVA, prostate cancer s/p prostatectomy 2014 who presented on 12/5 to the vascular team for follow-up left lower extremity wound.   It appears the patient presented to ED on 03/25/2024 for acute left limb ischemia, vascular was consulted underwent a left lower extremity thrombectomy, and angioplasty.,  Left SFA stent placement on 05/26/2024 by Dr. Sheree. Per vascular surgery on 04/29/2024 follow-up visit was noted worsening wound dehiscence-patient was brought back 05/03/2024 for debridement in OR and placement of wound VAC. Patient Eliquis  has been held since 04/29/2024.  Labs on 12/5 revealed hemoglobin of 6.2.  Patient was admitted for evaluation of the anemia and was transfused.  Gastroenterology was consulted.  Patient underwent EGD on 12/6.   Assessment and Plan:  Lactic acidosis, resolved LA of 2.7 on 12/17. Likely due to volume depletion. Patient has received IV fluids. Lactic acidosis has resolved. - Will continue to monitor volume status.  Leukocytosis Patient has had a bump in his white cell count from 3.3. White cell count of >20 for past 2 days. Patient admits to cough since admission, but states it is due to upper airway irritation.  He also continues to deny severe abdominal pain. -Given ongoing leukocytosis, new hypoxia, persistent abdominal pain with tenderness, lactic acidosis, a full evaluation of his leukocytosis is warranted.  CT C/A/P ordered. - Holding off antibiotics for now pending workup.  Hypoxia No baseline oxygen use. Patient became hypoxic to 81% last night Patient currently  requiring 2 L/min supplemental oxygen. May be due to volume overload due to aggressive volume repletion yesterday, but need to rule out infection. - Follow-up CT.  Atrial fibrillation (HCC) Patient in RVR 12/7, 12/8. Home diltiazem  had been on hold, patient receiving home metoprolol . -Resume home diltiazem  with short acting diltiazem  at lower dose to ensure stable BP prior to resuming home dose. -Anticoagulation as above - Per cardiology, patient warrants consideration for Watchman device.  Duodenal ulcer GI bleed Patient presented with anemia and melena. Gastroenterology consulted. Patient underwent EGD on 12/6 which revealed a large duodenal ulcer and no active bleeding. Biopsies were taken. Per GI recs, anticoagulation was held. - Will begin anticoagulation challenge based heparin  gtt. today, 12/8. -If hemoglobin remained stable on heparin  gtt., plan to transition to Eliquis  on 12/9. - Protonix  40 mg twice a day for 8 weeks   Symptomatic anemia Acute on chronic anemia due to blood loss -Mildly symptomatic with generalized weakness, sporadic dizziness -Due to GI bleed as noted above.  Gastroenterology was consulted and patient underwent EGD. -Denies any chest pain or shortness of breath -Hemoglobin noted to be 6.2 last hemoglobin in October 2025 was 11.3 -Transfused with 2U PRBC -Anemia workup shows low iron of 21 with saturation of 5.  Ferritin not checked. - B12 907, folate 18.  -Begin heparin  GTT -Vascular would like to add Plavix  eventually.  Critical limb ischemia of left lower extremity (HCC) - Vascular team following closely  - Presented to ED on 03/25/24 for acute left limb ischemia, - 12/28 underwent a left lower extremity thrombectomy, and angioplasty. Left SFA stent placement on by Dr. Sheree.  - 12/1  f/up for infection  and hematoma, noted worsening wound dehiscence - 12/5 patient was brought back today for debridement in OR and and placement of wound  VAC.  -Further management per vascular surgery - Patient Eliquis  has been held since 04/29/2024.  Plan for heparin  GTT to start today, 12/8 as indicated above.  ICH (intracerebral hemorrhage) (HCC) Remote history of intracranial hemorrhage -Due to A-fib patient has been anticoagulated on Eliquis , which has been held since 04/29/2024  Mixed hyperlipidemia LFTs within normal limits. -Continue home statin.  Aortic stenosis - Last echo 03/18/2024-reviewed EF 55-60%, normal LV function, mildly enlarged right ventricle, mildly reduced RV function, aortic valve was not well visualized no significant stenosis was reported -Currently asymptomatic, stable - Per cardiology, recommend repeat echocardiogram in the next 1 to 2 months as outpatient for high-quality imaging to reassess severity of aortic valve stenosis.  Essential hypertension, benign - Currently blood pressure stable - Continue home metoprolol . -Diltiazem  resumed as indicated above.  History of stroke without residual deficits - No new focal neurological findings -Continue medications as indicated above.  Stage II sacral decubitus ulcer Present on admission. - Pressure ulcer care was given.      Subjective: Patient states he still has some abdominal soreness.  He admits to a cough since admission but attributes it to upper airway irritation.  I explained to the patient and his wife that I am concerned about his leukocytosis, new hypoxia, ongoing abdominal pain/tenderness, chest findings and would like to evaluate this further with a CT of his chest, abdomen, pelvis.  We also discussed the plan for the anticoagulation.  Physical Exam: BP 106/81 (BP Location: Right Arm)   Pulse (!) 53   Temp (!) 97.5 F (36.4 C) (Oral)   Resp 18   Ht 6' 1 (1.854 m)   Wt 77.6 kg   SpO2 92%   BMI 22.57 kg/m    General: Alert, oriented X3  Eyes: Pupils equal, reactive  Oral cavity: Moist mucous membranes  Head: Atraumatic,  normocephalic  Neck: supple  Chest: Bilateral basal crackles CVS: Irregularly irregular, tachycardia Abd: No distention, soft, mildly tender, rebound tenderness +/- Extr: Pedal edema MSK: Left lower extremity with wound VAC + Neurological: Grossly intact.    Data Reviewed:    Latest Ref Rng & Units 05/06/2024    4:19 AM 05/05/2024   10:19 PM 05/05/2024   10:51 AM  CBC  WBC 4.0 - 10.5 K/uL 24.9  28.5  26.1   Hemoglobin 13.0 - 17.0 g/dL 88.2  87.8  87.3   Hematocrit 39.0 - 52.0 % 36.3  38.1  39.9   Platelets 150 - 400 K/uL 254  242  332       Latest Ref Rng & Units 05/06/2024    4:19 AM 05/05/2024   10:51 AM 05/05/2024    6:06 AM  BMP  Glucose 70 - 99 mg/dL 899  884  897   BUN 8 - 23 mg/dL 33  29  28   Creatinine 0.61 - 1.24 mg/dL 9.08  9.02  9.19   Sodium 135 - 145 mmol/L 132  133  131   Potassium 3.5 - 5.1 mmol/L 5.1  5.0  5.2   Chloride 98 - 111 mmol/L 100  102  103   CO2 22 - 32 mmol/L 24  20  19    Calcium  8.9 - 10.3 mg/dL 7.9  7.9  7.8      Family Communication: Spoke with patient's wife at bedside  Disposition: Status is: Inpatient Remains inpatient  appropriate because: Now on anticoagulation challenged with heparin  GTT, and also being evaluated for infection.       Author: MDALA-GAUSI, Sundiata Ferrick AGATHA, MD 05/06/2024 11:16 AM  For on call review [email or web address].

## 2024-05-06 NOTE — Progress Notes (Signed)
 Physical Therapy Treatment Patient Details Name: Kyle Kelley MRN: [id number] DOB: 03/19/1946 Today's Date: 05/06/2024   History of Present Illness Kyle Kelley is a 78 y.o. male admitted 05/03/24 with symptomatic anemia. He has been followed closely by vascular team outpatient and at visit 12/1 noted worsening wound dehiscence brought in 12/5 for debridement in OR and placement of wound VAC. Pt underwent EGD 12/6, which showed  gastritis, duodenal ulcers clean base nonbleeding, random gastric biopsies r/o h-pylori. PMHx: atrial fibrillation (on Eliquis /recently held), vasculopath, acute on chronic ischemic LLE nonhealing wound, HTN, HLD, aortic stenosis, CVA, prostate cancer s/p prostatectomy 2014, and PVD.    PT Comments  Pt is currently presenting at supervision to Min A for bed mobility, CGA for sit to stand and CGA for gait/stairs per home set up with RW and rails. Spouse present and supportive; states she is able to provide needed level of assist. Due to pt current functional status, home set up and available assistance at home recommending skilled physical therapy services 3x/week in order to address strength, balance and functional mobility to decrease risk for falls, injury and re-hospitalization.       If plan is discharge home, recommend the following: A little help with walking and/or transfers;A little help with bathing/dressing/bathroom;Assistance with cooking/housework;Assist for transportation;Help with stairs or ramp for entrance     Equipment Recommendations  None recommended by PT       Precautions / Restrictions Precautions Precautions: Fall Recall of Precautions/Restrictions: Intact Precaution/Restrictions Comments: wound vac LLE Restrictions Weight Bearing Restrictions Per Provider Order: No LLE Weight Bearing Per Provider Order: Non weight bearing     Mobility  Bed Mobility Overal bed mobility: Needs Assistance Bed Mobility: Supine to Sit, Sit to Supine      Supine to sit: Supervision Sit to supine: Min assist   General bed mobility comments: Min A for LE to get back to supine.    Transfers Overall transfer level: Needs assistance Equipment used: Rolling walker (2 wheels) Transfers: Sit to/from Stand Sit to Stand: Contact guard assist           General transfer comment: incr time to come to stance, demo'd good hand placement. CGA for safety    Ambulation/Gait Ambulation/Gait assistance: Contact guard assist, +2 safety/equipment Gait Distance (Feet): 200 Feet Assistive device: Rolling walker (2 wheels) Gait Pattern/deviations: Step-through pattern, Decreased stride length, Trunk flexed Gait velocity: decreased Gait velocity interpretation: 1.31 - 2.62 ft/sec, indicative of limited community ambulator   General Gait Details: Pt ambulated with a reciprocal gait pattern, even weight shift, and good foot clearence.Good distance from RW throughout. CGA for safety due to lines/balance   Stairs Stairs: Yes Stairs assistance: Contact guard assist Stair Management: Two rails, Forwards, Step to pattern Number of Stairs: 3 General stair comments: verbal cues for sequencing, Spouse has gait belt.       Balance Overall balance assessment: Needs assistance Sitting-balance support: Bilateral upper extremity supported, Feet supported Sitting balance-Leahy Scale: Fair     Standing balance support: Bilateral upper extremity supported, During functional activity, Reliant on assistive device for balance Standing balance-Leahy Scale: Fair Standing balance comment: reliant on RW, no overt LOB      Communication Communication Communication: Impaired Factors Affecting Communication: Hearing impaired  Cognition Arousal: Alert Behavior During Therapy: WFL for tasks assessed/performed   PT - Cognitive impairments: Problem solving, Safety/Judgement     Following commands: Impaired Following commands impaired: Follows one step commands  with increased time    Cueing  Cueing Techniques: Verbal cues, Gestural cues     General Comments General comments (skin integrity, edema, etc.): vital signs stable on room air.      Pertinent Vitals/Pain Pain Assessment Pain Assessment: No/denies pain     PT Goals (current goals can now be found in the care plan section) Acute Rehab PT Goals Patient Stated Goal: Return Home PT Goal Formulation: With patient/family Time For Goal Achievement: 05/19/24 Potential to Achieve Goals: Good Progress towards PT goals: Progressing toward goals    Frequency    Min 2X/week      PT Plan  Continue with current POC        AM-PAC PT 6 Clicks Mobility   Outcome Measure  Help needed turning from your back to your side while in a flat bed without using bedrails?: A Little Help needed moving from lying on your back to sitting on the side of a flat bed without using bedrails?: A Little Help needed moving to and from a bed to a chair (including a wheelchair)?: A Little Help needed standing up from a chair using your arms (e.g., wheelchair or bedside chair)?: A Little Help needed to walk in hospital room?: A Little Help needed climbing 3-5 steps with a railing? : A Little 6 Click Score: 18    End of Session Equipment Utilized During Treatment: Gait belt Activity Tolerance: Patient tolerated treatment well Patient left: with call bell/phone within reach;with family/visitor present;in bed;with bed alarm set Nurse Communication: Mobility status PT Visit Diagnosis: Difficulty in walking, not elsewhere classified (R26.2);Other abnormalities of gait and mobility (R26.89)     Time: 1206-1232 PT Time Calculation (min) (ACUTE ONLY): 26 min  Charges:    $Gait Training: 8-22 mins $Therapeutic Activity: 8-22 mins PT General Charges $$ ACUTE PT VISIT: 1 Visit                    Dorothyann Maier, DPT, CLT  Acute Rehabilitation Services Office: [phone] (Secure chat  preferred)    Dorothyann VEAR Maier 05/06/2024, 12:39 PM

## 2024-05-06 NOTE — Consult Note (Addendum)
 WOC team consulted for leaking vac.  Vascular surgeons placed NPWT 12/5 and have been following daily.  Secure chat to primary nurse to notify vascular if NPWT not functioning properly.   WOC team will not follow. Reconsult if further needs arise.   Thank you,    Powell Bar MSN, RN-BC, [employer]

## 2024-05-06 NOTE — Consult Note (Incomplete)
 Reason for Consult/Chief Complaint: pneumoperitoneum after EGD Consultant: Donato, MD  Kyle Kelley is an 78 y.o. male.   HPI: 26M s/p EGD on 12/6 with persistent abdominal pain post-procedure prompting CT scan. EGD was notable for medium HH, scattered congestion, and a non-bleeding cratered ulcer in the duodenal bulb. CT with small volume pneumoperitoneum tracking near the duodenum. Patient has been started on BID PPI and zosyn . He has been tolerating a diet.   Past Medical History:  Diagnosis Date   Aortic stenosis    Moderate AS with mean AVG 10.8, V-max 2.14m/s, SVI 28, DI 0.3   Carotid artery stenosis    1-39% bilaterally by dopplers 11/2022   Dyslipidemia    Hypertension    Inguinal hernia 2010   Small asymptomatic L inguinal Hernia   Permanent atrial fibrillation (HCC)    atrial fib    Pneumonia    hx of    Prostate cancer (HCC) 07/2012   s/p prostatectomy   PSA elevation    workup per Dr. Alm Fragmin, March 2014   Sigmoid diverticulosis    on colonoscopy in 2010   Skin cancer 02/2012   Dr Renay Homer   Stroke [facility])    Tobacco dependence    rarely smokes-3 cigs per week    Past Surgical History:  Procedure Laterality Date   APPLICATION OF WOUND VAC Left 05/03/2024   Procedure: APPLICATION, LEFT LEG WOUND VAC;  Surgeon: Sheree Penne Bruckner, MD;  Location: [facility] OR;  Service: Vascular;  Laterality: Left;   APPLICATION, SKIN SUBSTITUTE Left 05/03/2024   Procedure: APPLICATION, SKIN SUBSTITUTE KERECIS 38 SQ CM;  Surgeon: Sheree Penne Bruckner, MD;  Location: [facility] OR;  Service: Vascular;  Laterality: Left;   CARDIOVERSION  08/05/2005   Direct Current Cardioversion   COLONOSCOPY  2010   Direct current Cardioversion  08/05/2005   ENDARTERECTOMY POPLITEAL  03/25/2024   Procedure: ENDARTERECTOMY, LEFT TP TRUNK WITH PATCH ANGIOPLASTY;  Surgeon: Sheree Penne Bruckner, MD;  Location: [facility] OR;  Service: Vascular;;   ESOPHAGOGASTRODUODENOSCOPY N/A 05/04/2024   Procedure:  EGD (ESOPHAGOGASTRODUODENOSCOPY);  Surgeon: Elicia Claw, MD;  Location: [facility] ENDOSCOPY;  Service: Gastroenterology;  Laterality: N/A;   INCISION AND DRAINAGE OF WOUND Left 05/03/2024   Procedure: IRRIGATION AND DEBRIDEMENT WOUND, LEFT LOWER EXTREMITY;  Surgeon: Sheree Penne Bruckner, MD;  Location: [facility] OR;  Service: Vascular;  Laterality: Left;   LOWER EXTREMITY ANGIOGRAM Left 03/25/2024   Procedure: GERALYN, LOWER EXTREMITY;  Surgeon: Sheree Penne Bruckner, MD;  Location: [facility] OR;  Service: Vascular;  Laterality: Left;   PROSTATE BIOPSY  09/06/2012   Dr. Alm Fragmin   ROBOT ASSISTED LAPAROSCOPIC RADICAL PROSTATECTOMY N/A 10/31/2012   Procedure: ROBOTIC ASSISTED LAPAROSCOPIC RADICAL PROSTATECTOMY;  Surgeon: Alm GORMAN Fragmin, MD;  Location: [facility] ORS;  Service: Urology;  Laterality: N/A;   THROMBECTOMY FEMORAL ARTERY Left 03/25/2024   Procedure: THROMBECTOMY, LEFT FEMORAL TO BELOW KNEE ARTERY;  Surgeon: Sheree Penne Bruckner, MD;  Location: [facility] OR;  Service: Vascular;  Laterality: Left;   TONSILLECTOMY AND ADENOIDECTOMY     TONSILLECTOMY AND ADENOIDECTOMY     VEIN HARVEST Left 03/25/2024   Procedure: SURGICAL PROCUREMENT, LEFT LOWER SAPHENOUS VEIN;  Surgeon: Sheree Penne Bruckner, MD;  Location: [facility] OR;  Service: Vascular;  Laterality: Left;    Family History  Problem Relation Age of Onset   Hypertension Mother    Heart disease Father    Hyperlipidemia Father    Cancer Father        prostate  Social History:  reports that he has quit smoking. His smoking use included cigarettes. He has a 25 pack-year smoking history. He has never used smokeless tobacco. He reports current alcohol use. He reports that he does not use drugs.  Allergies:  Allergies  Allergen Reactions   Ace Inhibitors Anaphylaxis   Bactrim  Ds [Sulfamethoxazole -Trimethoprim ] Shortness Of Breath   Rythmol [Propafenone] Shortness Of Breath   Nitrofurantoin Rash    Medications: I have reviewed the patient's  current medications.  Results for orders placed or performed during the hospital encounter of 05/03/24 (from the past 48 hours)  CBC     Status: Abnormal   Collection Time: 05/05/24  6:06 AM  Result Value Ref Range   WBC 23.6 (H) 4.0 - 10.5 K/uL   RBC 4.50 4.22 - 5.81 MIL/uL   Hemoglobin 13.0 13.0 - 17.0 g/dL    Comment: REPEATED TO VERIFY POST TRANSFUSION SPECIMEN    HCT 41.1 39.0 - 52.0 %   MCV 91.3 80.0 - 100.0 fL   MCH 28.9 26.0 - 34.0 pg   MCHC 31.6 30.0 - 36.0 g/dL   RDW 84.1 (H) 88.4 - 84.4 %   Platelets 326 150 - 400 K/uL   nRBC 1.1 (H) 0.0 - 0.2 %    Comment: Performed at [facility], [address], [place], [place] [address]  Comprehensive metabolic panel     Status: Abnormal   Collection Time: 05/05/24  6:06 AM  Result Value Ref Range   Sodium 131 (L) 135 - 145 mmol/L   Potassium 5.2 (H) 3.5 - 5.1 mmol/L   Chloride 103 98 - 111 mmol/L   CO2 19 (L) 22 - 32 mmol/L   Glucose, Bld 102 (H) 70 - 99 mg/dL    Comment: Glucose reference range applies only to samples taken after fasting for at least 8 hours.   BUN 28 (H) 8 - 23 mg/dL   Creatinine, Ser 9.19 0.61 - 1.24 mg/dL   Calcium  7.8 (L) 8.9 - 10.3 mg/dL   Total Protein 5.3 (L) 6.5 - 8.1 g/dL   Albumin  2.1 (L) 3.5 - 5.0 g/dL   AST 29 15 - 41 U/L   ALT 21 0 - 44 U/L   Alkaline Phosphatase 49 38 - 126 U/L   Total Bilirubin 0.6 0.0 - 1.2 mg/dL   GFR, Estimated >39 >39 mL/min    Comment: (NOTE) Calculated using the CKD-EPI Creatinine Equation (2021)    Anion gap 9 5 - 15    Comment: Performed at [facility], [address], [place], [place] [address]  Glucose, capillary     Status: Abnormal   Collection Time: 05/05/24  7:21 AM  Result Value Ref Range   Glucose-Capillary 105 (H) 70 - 99 mg/dL    Comment: Glucose reference range applies only to samples taken after fasting for at least 8 hours.  Basic metabolic panel with GFR     Status: Abnormal   Collection Time: 05/05/24 10:51 AM  Result Value Ref  Range   Sodium 133 (L) 135 - 145 mmol/L   Potassium 5.0 3.5 - 5.1 mmol/L   Chloride 102 98 - 111 mmol/L   CO2 20 (L) 22 - 32 mmol/L   Glucose, Bld 115 (H) 70 - 99 mg/dL    Comment: Glucose reference range applies only to samples taken after fasting for at least 8 hours.   BUN 29 (H) 8 - 23 mg/dL   Creatinine, Ser 9.02 0.61 - 1.24  mg/dL   Calcium  7.9 (L) 8.9 - 10.3 mg/dL   GFR, Estimated >39 >39 mL/min    Comment: (NOTE) Calculated using the CKD-EPI Creatinine Equation (2021)    Anion gap 11 5 - 15    Comment: Performed at [facility], [address] [place], [place] [address]  CBC     Status: Abnormal   Collection Time: 05/05/24 10:51 AM  Result Value Ref Range   WBC 26.1 (H) 4.0 - 10.5 K/uL   RBC 4.41 4.22 - 5.81 MIL/uL   Hemoglobin 12.6 (L) 13.0 - 17.0 g/dL   HCT 60.0 60.9 - 47.9 %   MCV 90.5 80.0 - 100.0 fL   MCH 28.6 26.0 - 34.0 pg   MCHC 31.6 30.0 - 36.0 g/dL   RDW 84.1 (H) 88.4 - 84.4 %   Platelets 332 150 - 400 K/uL   nRBC 1.1 (H) 0.0 - 0.2 %    Comment: Performed at [facility], [address] [place], [place] [address]  Lactic acid, plasma     Status: Abnormal   Collection Time: 05/05/24 11:28 AM  Result Value Ref Range   Lactic Acid, Venous 2.7 (HH) 0.5 - 1.9 mmol/L    Comment: CRITICAL RESULT CALLED TO, READ BACK BY AND VERIFIED WITH WHITLEY.A RN @1309  05/05/2024 [email or web address] Performed at [facility], [address], [place], [place] [address]   Lactic acid, plasma     Status: Abnormal   Collection Time: 05/05/24  2:41 PM  Result Value Ref Range   Lactic Acid, Venous 3.7 (HH) 0.5 - 1.9 mmol/L    Comment: CRITICAL VALUE NOTED. VALUE IS CONSISTENT WITH PREVIOUSLY REPORTED/CALLED VALUE Performed at [facility], [address] [place], [place] [address]   Lactic acid, plasma     Status: Abnormal   Collection Time: 05/05/24  6:15 PM  Result Value Ref Range   Lactic Acid, Venous 3.4 (HH) 0.5 - 1.9 mmol/L    Comment: CRITICAL VALUE NOTED.  VALUE IS CONSISTENT WITH PREVIOUSLY REPORTED/CALLED VALUE Performed at [facility], [address], [place], [place] [address]   Lactic acid, plasma     Status: Abnormal   Collection Time: 05/05/24  8:41 PM  Result Value Ref Range   Lactic Acid, Venous 2.6 (HH) 0.5 - 1.9 mmol/L    Comment: CRITICAL VALUE NOTED. VALUE IS CONSISTENT WITH PREVIOUSLY REPORTED/CALLED VALUE Performed at [facility], [address], [place], [place] [address]   CBC     Status: Abnormal   Collection Time: 05/05/24 10:19 PM  Result Value Ref Range   WBC 28.5 (H) 4.0 - 10.5 K/uL   RBC 4.27 4.22 - 5.81 MIL/uL   Hemoglobin 12.1 (L) 13.0 - 17.0 g/dL   HCT 61.8 (L) 60.9 - 47.9 %   MCV 89.2 80.0 - 100.0 fL   MCH 28.3 26.0 - 34.0 pg   MCHC 31.8 30.0 - 36.0 g/dL   RDW 84.1 (H) 88.4 - 84.4 %   Platelets 242 150 - 400 K/uL   nRBC 0.3 (H) 0.0 - 0.2 %    Comment: Performed at [facility] Lab, [address], [place], [place] [address]  TSH     Status: None   Collection Time: 05/05/24 10:19 PM  Result Value Ref Range   TSH 2.963 0.350 - 4.500 uIU/mL    Comment: Performed by a 3rd Generation assay with a functional sensitivity of <=0.01 uIU/mL. Performed at [facility], [address], [place], [place] [address]   CBC     Status: Abnormal   Collection Time: 05/06/24  4:19 AM  Result Value Ref Range   WBC 24.9 (H) 4.0 - 10.5 K/uL   RBC 4.07 (L) 4.22 - 5.81 MIL/uL   Hemoglobin 11.7 (L) 13.0 - 17.0 g/dL   HCT 63.6 (L) 60.9 - 47.9 %   MCV 89.2 80.0 - 100.0 fL   MCH 28.7 26.0 - 34.0 pg   MCHC 32.2 30.0 - 36.0 g/dL   RDW 84.0 (H) 88.4 - 84.4 %   Platelets 254 150 - 400 K/uL   nRBC 0.2 0.0 - 0.2 %    Comment: Performed at [facility], [address] [place], [place] [address]  Comprehensive metabolic panel     Status: Abnormal   Collection Time: 05/06/24  4:19 AM  Result Value Ref Range   Sodium 132 (L) 135 - 145 mmol/L   Potassium 5.1 3.5 - 5.1 mmol/L   Chloride 100 98 - 111  mmol/L   CO2 24 22 - 32 mmol/L   Glucose, Bld 100 (H) 70 - 99 mg/dL    Comment: Glucose reference range applies only to samples taken after fasting for at least 8 hours.   BUN 33 (H) 8 - 23 mg/dL   Creatinine, Ser 9.08 0.61 - 1.24 mg/dL   Calcium  7.9 (L) 8.9 - 10.3 mg/dL   Total Protein 4.9 (L) 6.5 - 8.1 g/dL   Albumin  1.8 (L) 3.5 - 5.0 g/dL   AST 21 15 - 41 U/L   ALT 19 0 - 44 U/L   Alkaline Phosphatase 47 38 - 126 U/L   Total Bilirubin 0.6 0.0 - 1.2 mg/dL   GFR, Estimated >39 >39 mL/min    Comment: (NOTE) Calculated using the CKD-EPI Creatinine Equation (2021)    Anion gap 8 5 - 15    Comment: Performed at [facility], [address], [place], [place] [address]  Glucose, capillary     Status: None   Collection Time: 05/06/24  8:23 AM  Result Value Ref Range   Glucose-Capillary 78 70 - 99 mg/dL    Comment: Glucose reference range applies only to samples taken after fasting for at least 8 hours.  Lactic acid, plasma     Status: None   Collection Time: 05/06/24  8:39 AM  Result Value Ref Range   Lactic Acid, Venous 1.5 0.5 - 1.9 mmol/L    Comment: Performed at [facility], [address], [place], [place] [address]  Glucose, capillary     Status: Abnormal   Collection Time: 05/06/24 11:53 AM  Result Value Ref Range   Glucose-Capillary 127 (H) 70 - 99 mg/dL    Comment: Glucose reference range applies only to samples taken after fasting for at least 8 hours.  Heparin  level (unfractionated)     Status: Abnormal   Collection Time: 05/06/24  3:34 PM  Result Value Ref Range   Heparin  Unfractionated 0.20 (L) 0.30 - 0.70 IU/mL    Comment: (NOTE) The clinical reportable range upper limit is being lowered to >1.10 to align with the FDA approved guidance for the current laboratory assay.  If heparin  results are below expected values, and patient dosage has  been confirmed, suggest follow up testing of antithrombin III levels. Performed at [facility], [address] [place], [place] [address]   CBC     Status: Abnormal  Collection Time: 05/06/24  7:48 PM  Result Value Ref Range   WBC 27.2 (H) 4.0 - 10.5 K/uL   RBC 3.86 (L) 4.22 - 5.81 MIL/uL   Hemoglobin 11.1 (L) 13.0 - 17.0 g/dL   HCT 65.1 (L) 60.9 - 47.9 %   MCV 90.2 80.0 - 100.0 fL   MCH 28.8 26.0 - 34.0 pg   MCHC 31.9 30.0 - 36.0 g/dL   RDW 84.0 (H) 88.4 - 84.4 %   Platelets 232 150 - 400 K/uL   nRBC 0.2 0.0 - 0.2 %    Comment: Performed at [facility], [address] [place], [place] [address]  Basic metabolic panel with GFR     Status: Abnormal   Collection Time: 05/06/24  9:22 PM  Result Value Ref Range   Sodium 128 (L) 135 - 145 mmol/L   Potassium 5.0 3.5 - 5.1 mmol/L   Chloride 99 98 - 111 mmol/L   CO2 23 22 - 32 mmol/L   Glucose, Bld 146 (H) 70 - 99 mg/dL    Comment: Glucose reference range applies only to samples taken after fasting for at least 8 hours.   BUN 34 (H) 8 - 23 mg/dL   Creatinine, Ser 9.11 0.61 - 1.24 mg/dL   Calcium  7.5 (L) 8.9 - 10.3 mg/dL   GFR, Estimated >39 >39 mL/min    Comment: (NOTE) Calculated using the CKD-EPI Creatinine Equation (2021)    Anion gap 6 5 - 15    Comment: ELECTROLYTES REPEATED TO VERIFY Performed at [facility], [address], [place], [place] [address]   Magnesium     Status: None   Collection Time: 05/06/24  9:22 PM  Result Value Ref Range   Magnesium 2.2 1.7 - 2.4 mg/dL    Comment: Performed at [facility], [address], [place], [place] [address]    CT CHEST ABDOMEN PELVIS WO CONTRAST Result Date: 05/06/2024 CLINICAL DATA:  , Hypoxia, and abdominal pain EXAM: CT CHEST, ABDOMEN AND PELVIS WITHOUT CONTRAST TECHNIQUE: Multidetector CT imaging of the chest, abdomen and pelvis was performed following the standard protocol without IV contrast. RADIATION DOSE REDUCTION: This exam was performed according to the departmental dose-optimization program which includes automated exposure control, adjustment of  the mA and/or kV according to patient size and/or use of iterative reconstruction technique. COMPARISON:  CT abdomen and pelvis dated 12/24/2020 FINDINGS: CT CHEST FINDINGS Cardiovascular: Normal heart size. No significant pericardial fluid/thickening. Great vessels are normal in course and caliber. Coronary artery calcifications and aortic atherosclerosis. Mediastinum/Nodes: Imaged thyroid  gland without nodules meeting criteria for imaging follow-up by size. Small hiatal hernia. No pathologically enlarged axillary, supraclavicular, mediastinal, or hilar lymph nodes. Lungs/Pleura: The central airways are patent. Trace secretions within the trachea. Mild diffuse bronchial wall thickening. Moderate centrilobular and paraseptal emphysema. Small foci of irregular consolidation within the posterior right upper lobe and basilar right middle lobe. Bilateral lower lobe subsegmental atelectasis. No pneumothorax. Moderate bilateral pleural effusions. Musculoskeletal: No acute or abnormal lytic or blastic osseous lesions. Flowing anterior osteophytes over at least 4 contiguous thoracic vertebrae which can be seen in the setting of diffuse idiopathic skeletal hyperostosis (DISH). CT ABDOMEN PELVIS FINDINGS Hepatobiliary: Mildly nodular hepatic contour. No focal hepatic lesions. No intra or extrahepatic biliary ductal dilation. Normal gallbladder. Pancreas: No focal lesions or main ductal dilation. Spleen: Normal in size without focal abnormality. Adrenals/Urinary Tract: No adrenal nodules. No suspicious renal mass, calculi, or hydronephrosis. No focal bladder wall thickening.  Stomach/Bowel: Normal appearance of the stomach. The wall of the 2/3 portion of the duodenum appears thickened/indistinct (3:81). Presumed cecum is displaced superiorly and anteriorly. A dilated loop of small bowel in the right upper quadrant (3:86) is inseparable from the thickened duodenum. Colonic diverticulosis without acute diverticulitis. Appendix is  not discretely seen. Vascular/Lymphatic: Aortic atherosclerosis. No enlarged abdominal or pelvic lymph nodes. Reproductive: Prostatectomy.  Large bilateral hydroceles. Other: Small volume free air, predominantly within the right upper quadrant. A focus of free air appears to track to the proximal duodenum (3:70). Small volume free fluid with layering hyperattenuation in the lower abdomen/pelvis. Musculoskeletal: No acute or abnormal lytic or blastic osseous findings. Small bilateral inguinal hernias containing fat on the left and fat and free fluid on the right. Diffuse body wall edema. IMPRESSION: 1. Small volume pneumoperitoneum, predominantly within the right upper quadrant, with a focus of free air appearing to track to the proximal duodenum, suspicious for perforated duodenal ulcer. 2. Small volume free fluid with layering hyperattenuation in the lower abdomen/pelvis, which may represent hemoperitoneum. 3. Small foci of irregular consolidation within the posterior right upper lobe and basilar right middle lobe, which may represent infection or aspiration. 4. Moderate bilateral pleural effusions, diffuse body wall edema, and large bilateral hydroceles. 5. Mildly nodular hepatic contour, which can be seen in the setting of cirrhosis. Recommend correlation with liver function tests. 6. Aortic Atherosclerosis (ICD10-I70.0) and Emphysema (ICD10-J43.9). Coronary artery calcifications. Assessment for potential risk factor modification, dietary therapy or pharmacologic therapy may be warranted, if clinically indicated. Critical Value/emergent results were called by telephone at the time of interpretation on 05/06/2024 at 5:45 pm to provider [facility] MDALA-GAUSI , who verbally acknowledged these results. Electronically Signed   By: Limin  Xu M.D.   On: 05/06/2024 17:56    ROS 10 point review of systems is negative except as listed above in HPI.   Physical Exam Blood pressure (!) 110/58, pulse 85, temperature 97.7 F  (36.5 C), resp. rate 20, height 6' 1 (1.854 m), weight 77.6 kg, SpO2 97%. Constitutional: well-developed, well-nourished HEENT: pupils equal, round, reactive to light, 2mm b/l, moist conjunctiva, external inspection of ears and nose normal, hearing intact Oropharynx: normal oropharyngeal mucosa, poor dentition Neck: no thyromegaly, trachea midline, no midline cervical tenderness to palpation Chest: breath sounds equal bilaterally, normal respiratory effort, no midline or lateral chest wall tenderness to palpation/deformity Abdomen: soft, NT, no bruising, no hepatosplenomegaly Skin: warm, dry, no rashes Psych: normal memory, normal mood/affect     Assessment/Plan: Pneumoperitoneum 2/2 DU s/p EGD - small volume and patient asymptomatic, so hoping this can be managed nonoperatively. Suspecting thin walled duodenum in the region of the ulcer that allowed for passage of small volume air. Already on zosyn  and BID PPI. Bowel rest with NGT for now. Albumin  1.7, so would consider supplemental nutrition with TPN while NPO. Appreciate GI recs regarding addition of sucralfate when okay for PO.  FEN - NPO except sips/chips Dispo - 2W    Dreama GEANNIE Hanger, MD General and Trauma Surgery [facility]

## 2024-05-06 NOTE — Progress Notes (Signed)
 PHARMACY - ANTICOAGULATION CONSULT NOTE  Pharmacy Consult for: Heparin  Indication: atrial fibrillation  Allergies  Allergen Reactions   Ace Inhibitors Anaphylaxis   Bactrim  Ds [Sulfamethoxazole -Trimethoprim ] Shortness Of Breath   Rythmol [Propafenone] Shortness Of Breath   Nitrofurantoin Rash    Patient Measurements: Height: 6' 1 (185.4 cm) Weight: 77.6 kg (171 lb 1.2 oz) IBW/kg (Calculated) : 79.9 HEPARIN  DW (KG): 77.6  Vital Signs: Temp: 97.5 F (36.4 C) (12/08 0826) Temp Source: Oral (12/08 0826) BP: 106/81 (12/08 0826) Pulse Rate: 53 (12/08 0826)  Labs: Recent Labs    05/03/24 1804 05/04/24 0550 05/05/24 0606 05/05/24 1051 05/05/24 2219 05/06/24 0419  HGB 9.1* 9.0* 13.0 12.6* 12.1* 11.7*  HCT 28.7* 28.3* 41.1 39.9 38.1* 36.3*  PLT  --  306 326 332 242 254  APTT  --  29  --   --   --   --   LABPROT 16.2* 15.6*  --   --   --   --   INR 1.2 1.2  --   --   --   --   CREATININE 0.71  --  0.80 0.97  --  0.91    Estimated Creatinine Clearance: 75.8 mL/min (by C-G formula based on SCr of 0.91 mg/dL).   Medical History: Past Medical History:  Diagnosis Date   Aortic stenosis    Moderate AS with mean AVG 10.8, V-max 2.78m/s, SVI 28, DI 0.3   Carotid artery stenosis    1-39% bilaterally by dopplers 11/2022   Dyslipidemia    Hypertension    Inguinal hernia 2010   Small asymptomatic L inguinal Hernia   Permanent atrial fibrillation (HCC)    atrial fib    Pneumonia    hx of    Prostate cancer (HCC) 07/2012   s/p prostatectomy   PSA elevation    workup per Dr. Alm Fragmin, March 2014   Sigmoid diverticulosis    on colonoscopy in 2010   Skin cancer 02/2012   Dr Renay Homer   Stroke [facility])    Tobacco dependence    rarely smokes-3 cigs per week      Assessment: 78yom with Hx Afib on apixaban  PTA.  Admitted with 6.2> improved 12 with 2uts PRBC.  S/p EGD with gastritis and duodenal ulcer Apixaban  on hold - start heparin  drip - keep low end goal and monitor  bleeding prior to restart apixaban   Goal of Therapy:  Heparin  level 0.3-0.5 units/ml Monitor platelets by anticoagulation protocol: Yes   Plan:  Heparin  drip 800 uts/hr  Draw heparin  level 6hr after start Daily heparin  level and CBC  Monitor s/s bleeding     Olam Chalk Pharm.D. CPP, BCPS Clinical Pharmacist [phone] 05/06/2024 9:06 AM

## 2024-05-06 NOTE — Significant Event (Signed)
 EVENT NOTE  CT C/A/P ordered today. I was called by radiology for the following critical findings:  1. Small volume pneumoperitoneum, predominantly within the right upper quadrant, with a focus of free air appearing to track to the proximal duodenum, suspicious for perforated duodenal ulcer. 2. Small volume free fluid with layering hyperattenuation in the lower abdomen/pelvis, which may represent hemoperitoneum.  -I have informed the patient and his wife about this findings. - General Surgery consulted.  Recs appreciated. - NPO. - NG tube. - Hold heparin . - Total parenteral nutrition in the AM.  MDALA-GAUSI, GOLDEN PILLOW, MD 05/06/2024 6:30 PM

## 2024-05-06 NOTE — Progress Notes (Addendum)
  Progress Note    05/06/2024 9:39 AM 2 Days Post-Op  Subjective:  no overnight issues  Vitals:   05/06/24 0422 05/06/24 0826  BP: 103/73 106/81  Pulse: (!) 118 (!) 53  Resp:  18  Temp: (!) 97.5 F (36.4 C) (!) 97.5 F (36.4 C)  SpO2: 100% 92%    Physical Exam: Awake alert and oriented Left lower extremity well-perfused and wound VAC to suction medial left leg  CBC    Component Value Date/Time   WBC 24.9 (H) 05/06/2024 0419   RBC 4.07 (L) 05/06/2024 0419   HGB 11.7 (L) 05/06/2024 0419   HCT 36.3 (L) 05/06/2024 0419   PLT 254 05/06/2024 0419   MCV 89.2 05/06/2024 0419   MCH 28.7 05/06/2024 0419   MCHC 32.2 05/06/2024 0419   RDW 15.9 (H) 05/06/2024 0419   LYMPHSABS 1.0 03/26/2024 0640   MONOABS 1.0 03/26/2024 0640   EOSABS 0.0 03/26/2024 0640   BASOSABS 0.0 03/26/2024 0640    BMET    Component Value Date/Time   NA 132 (L) 05/06/2024 0419   K 5.1 05/06/2024 0419   CL 100 05/06/2024 0419   CO2 24 05/06/2024 0419   GLUCOSE 100 (H) 05/06/2024 0419   BUN 33 (H) 05/06/2024 0419   CREATININE 0.91 05/06/2024 0419   CALCIUM  7.9 (L) 05/06/2024 0419   GFRNONAA >60 05/06/2024 0419   GFRAA >90 11/01/2012 0522    INR    Component Value Date/Time   INR 1.2 05/04/2024 0550     Intake/Output Summary (Last 24 hours) at 05/06/2024 0939 Last data filed at 05/05/2024 1707 Gross per 24 hour  Intake 959.06 ml  Output 400 ml  Net 559.06 ml     Assessment:  78 y.o. male is s/p debridement left below knee postsurgical wound  Plan: Wound VAC change today Restart Eliquis  when okay per GI  Kyle Kelley C. Sheree, MD [facility] [place] Office: [phone] Pager: [phone]  05/06/2024 9:39 AM  Addendum: Wound VAC change at bedside as pictured above.  He will need home health wound care for home but is otherwise okay for discharge from vascular standpoint.  Kyle Sheree, MD

## 2024-05-06 NOTE — Progress Notes (Signed)
 Transition of Care [facility]) - Inpatient Brief Assessment   Patient Details  Name: Kyle Kelley MRN: [id number] Date of Birth: Feb 23, 1946  Transition of Care [facility]) CM/SW Contact:    Rosaline JONELLE Joe, RN Phone Number: 05/06/2024, 12:43 PM   Clinical Narrative: CM met with the patient and wife and the bedside and patient will likely discharge to home on Tuesday/Wednesday per Dr. Jearlean.  I spoke with the patient and wife and they are aware that patient will need wound vac delivered to the home prior to discharge.  Patient is active with [facility] for RN - RN and PT home health orders placed - to be co-signed by MD.  I delivered [facility] home wound vac to the room today  - it will remain in the room with the patient until patient goes home.  Wife plans to provide transportation to home by car when stable.  Patient's discharging address is [address] [place], [place] [address].   Transition of Care Asessment: Insurance and Status: (P) Insurance coverage has been reviewed Patient has primary care physician: (P) Yes Home environment has been reviewed: (P) from home with spouse Prior level of function:: (P) self Prior/Current Home Services: (P) Current home services (Active with [facility] for RN) Social Drivers of Health Review: (P) SDOH reviewed interventions complete Readmission risk has been reviewed: (P) Yes Transition of care needs: (P) transition of care needs identified, TOC will continue to follow

## 2024-05-06 NOTE — Progress Notes (Signed)
 PHARMACY - ANTICOAGULATION CONSULT NOTE  Pharmacy Consult for: Heparin  Indication: atrial fibrillation  Allergies  Allergen Reactions   Ace Inhibitors Anaphylaxis   Bactrim  Ds [Sulfamethoxazole -Trimethoprim ] Shortness Of Breath   Rythmol [Propafenone] Shortness Of Breath   Nitrofurantoin Rash    Patient Measurements: Height: 6' 1 (185.4 cm) Weight: 77.6 kg (171 lb 1.2 oz) IBW/kg (Calculated) : 79.9 HEPARIN  DW (KG): 77.6  Vital Signs: Temp: 98.2 F (36.8 C) (12/08 1303) Temp Source: Oral (12/08 1152) BP: 107/65 (12/08 1303) Pulse Rate: 94 (12/08 1303)  Labs: Recent Labs    05/03/24 1804 05/03/24 1804 05/04/24 0550 05/05/24 0606 05/05/24 1051 05/05/24 2219 05/06/24 0419 05/06/24 1534  HGB 9.1*  --  9.0* 13.0 12.6* 12.1* 11.7*  --   HCT 28.7*  --  28.3* 41.1 39.9 38.1* 36.3*  --   PLT  --    < > 306 326 332 242 254  --   APTT  --   --  29  --   --   --   --   --   LABPROT 16.2*  --  15.6*  --   --   --   --   --   INR 1.2  --  1.2  --   --   --   --   --   HEPARINUNFRC  --   --   --   --   --   --   --  0.20*  CREATININE 0.71  --   --  0.80 0.97  --  0.91  --    < > = values in this interval not displayed.    Estimated Creatinine Clearance: 75.8 mL/min (by C-G formula based on SCr of 0.91 mg/dL).   Medical History: Past Medical History:  Diagnosis Date   Aortic stenosis    Moderate AS with mean AVG 10.8, V-max 2.58m/s, SVI 28, DI 0.3   Carotid artery stenosis    1-39% bilaterally by dopplers 11/2022   Dyslipidemia    Hypertension    Inguinal hernia 2010   Small asymptomatic L inguinal Hernia   Permanent atrial fibrillation (HCC)    atrial fib    Pneumonia    hx of    Prostate cancer (HCC) 07/2012   s/p prostatectomy   PSA elevation    workup per Dr. Alm Fragmin, March 2014   Sigmoid diverticulosis    on colonoscopy in 2010   Skin cancer 02/2012   Dr Renay Homer   [facility]roke [facility])    Tobacco dependence    rarely smokes-3 cigs per week       Assessment: 78yom with Hx Afib on apixaban  PTA.  Admitted with 6.2> improved 12 with 2uts PRBC.  S/p EGD with gastritis and duodenal ulcer Apixaban  on hold - start heparin  drip - keep low end goal and monitor bleeding prior to restart apixaban   Heparin  level came back low at 0.2. We will increase rate and check another level.   Goal of Therapy:  Heparin  level 0.3-0.5 units/ml Monitor platelets by anticoagulation protocol: Yes   Plan:  Increase heparin  drip to 950 uts/hr  Draw heparin  level 8hr Daily heparin  level and CBC  Monitor s/s bleeding   Sergio Batch, PharmD, BCIDP, AAHIVP, CPP Infectious Disease Pharmacist 05/06/2024 4:09 PM

## 2024-05-07 ENCOUNTER — Inpatient Hospital Stay (HOSPITAL_COMMUNITY): Admitting: Anesthesiology

## 2024-05-07 ENCOUNTER — Encounter (HOSPITAL_COMMUNITY): Payer: Self-pay | Admitting: Family Medicine

## 2024-05-07 ENCOUNTER — Inpatient Hospital Stay (HOSPITAL_COMMUNITY)

## 2024-05-07 ENCOUNTER — Ambulatory Visit

## 2024-05-07 ENCOUNTER — Other Ambulatory Visit: Payer: Self-pay

## 2024-05-07 ENCOUNTER — Encounter (HOSPITAL_COMMUNITY)
Admission: RE | Disposition: A | Discharge status: Skilled Nursing Facility | Payer: Self-pay | Source: Home / Self Care | Attending: Internal Medicine

## 2024-05-07 DIAGNOSIS — Z4682 Encounter for fitting and adjustment of non-vascular catheter: Secondary | ICD-10-CM | POA: Diagnosis not present

## 2024-05-07 DIAGNOSIS — Z452 Encounter for adjustment and management of vascular access device: Secondary | ICD-10-CM | POA: Diagnosis not present

## 2024-05-07 DIAGNOSIS — R918 Other nonspecific abnormal finding of lung field: Secondary | ICD-10-CM | POA: Diagnosis not present

## 2024-05-07 DIAGNOSIS — J90 Pleural effusion, not elsewhere classified: Secondary | ICD-10-CM | POA: Diagnosis not present

## 2024-05-07 HISTORY — PX: LAPAROTOMY: SHX154

## 2024-05-07 LAB — COMPREHENSIVE METABOLIC PANEL WITH GFR
ALT: 20 U/L (ref 0–44)
AST: 23 U/L (ref 15–41)
Albumin: 1.7 g/dL — ABNORMAL LOW (ref 3.5–5.0)
Alkaline Phosphatase: 49 U/L (ref 38–126)
Anion gap: 14 (ref 5–15)
BUN: 30 mg/dL — ABNORMAL HIGH (ref 8–23)
CO2: 16 mmol/L — ABNORMAL LOW (ref 22–32)
Calcium: 7.8 mg/dL — ABNORMAL LOW (ref 8.9–10.3)
Chloride: 100 mmol/L (ref 98–111)
Creatinine, Ser: 0.85 mg/dL (ref 0.61–1.24)
GFR, Estimated: >60 mL/min (ref >60)
Glucose, Bld: 110 mg/dL — ABNORMAL HIGH (ref 70–99)
Potassium: 4.7 mmol/L (ref 3.5–5.1)
Sodium: 130 mmol/L — ABNORMAL LOW (ref 135–145)
Total Bilirubin: 0.8 mg/dL (ref 0.0–1.2)
Total Protein: 5.2 g/dL — ABNORMAL LOW (ref 6.5–8.1)

## 2024-05-07 LAB — GLUCOSE, CAPILLARY
Glucose-Capillary: 120 mg/dL — ABNORMAL HIGH (ref 70–99)
Glucose-Capillary: 144 mg/dL — ABNORMAL HIGH (ref 70–99)

## 2024-05-07 LAB — CBC
HCT: 31.6 % — ABNORMAL LOW (ref 39.0–52.0)
Hemoglobin: 10.2 g/dL — ABNORMAL LOW (ref 13.0–17.0)
MCH: 28.7 pg (ref 26.0–34.0)
MCHC: 32.3 g/dL (ref 30.0–36.0)
MCV: 88.8 fL (ref 80.0–100.0)
Platelets: 225 K/uL (ref 150–400)
RBC: 3.56 MIL/uL — ABNORMAL LOW (ref 4.22–5.81)
RDW: 16 % — ABNORMAL HIGH (ref 11.5–15.5)
WBC: 29.8 K/uL — ABNORMAL HIGH (ref 4.0–10.5)
nRBC: 0.1 % (ref 0.0–0.2)

## 2024-05-07 LAB — PHOSPHORUS: Phosphorus: 3.2 mg/dL (ref 2.5–4.6)

## 2024-05-07 LAB — TYPE AND SCREEN
ABO/RH(D): O POS
Antibody Screen: NEGATIVE

## 2024-05-07 LAB — MAGNESIUM: Magnesium: 2.2 mg/dL (ref 1.7–2.4)

## 2024-05-07 LAB — SURGICAL PATHOLOGY

## 2024-05-07 MED ORDER — LACTATED RINGERS IV SOLN: INTRAVENOUS | Status: DC

## 2024-05-07 MED ORDER — BUPIVACAINE-EPINEPHRINE (PF) 0.5% -1:200000 IJ SOLN
INTRAMUSCULAR | Status: AC
  Filled 2024-05-07: qty 30

## 2024-05-07 MED ORDER — INSULIN ASPART 100 UNIT/ML IJ SOLN
0.0000 [IU] | Freq: Four times a day (QID) | INTRAMUSCULAR | Status: AC
  Administered 2024-05-07 – 2024-05-09 (×7): 1 [IU] via SUBCUTANEOUS
  Administered 2024-05-09: 2 [IU] via SUBCUTANEOUS
  Administered 2024-05-09 – 2024-05-10 (×3): 1 [IU] via SUBCUTANEOUS
  Administered 2024-05-10: 2 [IU] via SUBCUTANEOUS
  Administered 2024-05-11 – 2024-05-12 (×8): 1 [IU] via SUBCUTANEOUS
  Filled 2024-05-07 (×2): qty 1
  Filled 2024-05-07: qty 2
  Filled 2024-05-07 (×2): qty 1
  Filled 2024-05-07: qty 2
  Filled 2024-05-07 (×2): qty 1
  Filled 2024-05-07 (×2): qty 2
  Filled 2024-05-07 (×9): qty 1

## 2024-05-07 MED ORDER — METHOCARBAMOL 1000 MG/10ML IJ SOLN
500.0000 mg | Freq: Three times a day (TID) | INTRAMUSCULAR | Status: DC
  Administered 2024-05-07 – 2024-05-12 (×13): 500 mg via INTRAVENOUS
  Filled 2024-05-07 (×24): qty 5

## 2024-05-07 MED ORDER — METOPROLOL TARTRATE 5 MG/5ML IV SOLN
2.5000 mg | INTRAVENOUS | Status: AC | PRN
  Administered 2024-05-07 (×2): 2.5 mg via INTRAVENOUS

## 2024-05-07 MED ORDER — SODIUM CHLORIDE 0.9 % IV SOLN: INTRAVENOUS | Status: DC

## 2024-05-07 MED ORDER — AMISULPRIDE (ANTIEMETIC) 5 MG/2ML IV SOLN: 10.0000 mg | Freq: Once | INTRAVENOUS | Status: DC | PRN

## 2024-05-07 MED ORDER — FENTANYL CITRATE (PF) 100 MCG/2ML IJ SOLN
INTRAMUSCULAR | Status: AC
  Filled 2024-05-07: qty 2

## 2024-05-07 MED ORDER — TRACE MINERALS CU-MN-SE-ZN 300-55-60-3000 MCG/ML IV SOLN
INTRAVENOUS | Status: AC
  Filled 2024-05-07: qty 390.4

## 2024-05-07 MED ORDER — FENTANYL CITRATE (PF) 100 MCG/2ML IJ SOLN
25.0000 ug | INTRAMUSCULAR | Status: DC | PRN
  Administered 2024-05-07: 25 ug via INTRAVENOUS

## 2024-05-07 MED ORDER — FENTANYL CITRATE (PF) 100 MCG/2ML IJ SOLN
INTRAMUSCULAR | Status: DC | PRN
  Administered 2024-05-07: 100 ug via INTRAVENOUS

## 2024-05-07 MED ORDER — ESMOLOL HCL 100 MG/10ML IV SOLN
INTRAVENOUS | Status: AC
  Filled 2024-05-07: qty 10

## 2024-05-07 MED ORDER — BUPIVACAINE HCL (PF) 0.5 % IJ SOLN
INTRAMUSCULAR | Status: AC
  Filled 2024-05-07: qty 30

## 2024-05-07 MED ORDER — PHENYLEPHRINE 80 MCG/ML (10ML) SYRINGE FOR IV PUSH (FOR BLOOD PRESSURE SUPPORT)
PREFILLED_SYRINGE | INTRAVENOUS | Status: DC | PRN
  Administered 2024-05-07 (×3): 160 ug via INTRAVENOUS

## 2024-05-07 MED ORDER — LACTATED RINGERS IV SOLN: INTRAVENOUS | Status: DC | PRN

## 2024-05-07 MED ORDER — PHENYLEPHRINE HCL-NACL 20-0.9 MG/250ML-% IV SOLN
INTRAVENOUS | Status: DC | PRN
  Administered 2024-05-07: 50 ug/min via INTRAVENOUS

## 2024-05-07 MED ORDER — CHLORHEXIDINE GLUCONATE CLOTH 2 % EX PADS
6.0000 | MEDICATED_PAD | Freq: Every day | CUTANEOUS | Status: DC
  Administered 2024-05-07 – 2024-05-20 (×14): 6 via TOPICAL

## 2024-05-07 MED ORDER — ROCURONIUM BROMIDE 10 MG/ML (PF) SYRINGE
PREFILLED_SYRINGE | INTRAVENOUS | Status: DC | PRN
  Administered 2024-05-07: 50 mg via INTRAVENOUS

## 2024-05-07 MED ORDER — PROPOFOL 10 MG/ML IV BOLUS
INTRAVENOUS | Status: AC
  Filled 2024-05-07: qty 20

## 2024-05-07 MED ORDER — LIDOCAINE 2% (20 MG/ML) 5 ML SYRINGE
INTRAMUSCULAR | Status: AC
  Filled 2024-05-07: qty 5

## 2024-05-07 MED ORDER — LIDOCAINE 2% (20 MG/ML) 5 ML SYRINGE
INTRAMUSCULAR | Status: DC | PRN
  Administered 2024-05-07: 60 mg via INTRAVENOUS

## 2024-05-07 MED ORDER — SUGAMMADEX SODIUM 200 MG/2ML IV SOLN
INTRAVENOUS | Status: DC | PRN
  Administered 2024-05-07: 200 mg via INTRAVENOUS

## 2024-05-07 MED ORDER — SODIUM CHLORIDE 0.9 % IV SOLN
100.0000 mg | INTRAVENOUS | Status: AC
  Administered 2024-05-07 – 2024-05-11 (×5): 100 mg via INTRAVENOUS
  Filled 2024-05-07 (×5): qty 5

## 2024-05-07 MED ORDER — SODIUM CHLORIDE 0.9 % IV SOLN: INTRAVENOUS | Status: AC

## 2024-05-07 MED ORDER — ACETAMINOPHEN 10 MG/ML IV SOLN: 1000.0000 mg | Freq: Once | INTRAVENOUS | Status: DC | PRN

## 2024-05-07 MED ORDER — ESMOLOL HCL 100 MG/10ML IV SOLN
INTRAVENOUS | Status: DC | PRN
  Administered 2024-05-07 (×3): 20 mg via INTRAVENOUS

## 2024-05-07 MED ORDER — ONDANSETRON HCL 4 MG/2ML IJ SOLN: 4.0000 mg | Freq: Once | INTRAMUSCULAR | Status: DC | PRN

## 2024-05-07 MED ORDER — SODIUM CHLORIDE 0.9% FLUSH
10.0000 mL | INTRAVENOUS | Status: DC | PRN
  Administered 2024-05-08: 20 mL

## 2024-05-07 MED ORDER — ACETAMINOPHEN 10 MG/ML IV SOLN
1000.0000 mg | Freq: Four times a day (QID) | INTRAVENOUS | Status: AC
  Administered 2024-05-07 – 2024-05-08 (×3): 1000 mg via INTRAVENOUS
  Filled 2024-05-07 (×5): qty 100

## 2024-05-07 MED ORDER — ROCURONIUM BROMIDE 10 MG/ML (PF) SYRINGE
PREFILLED_SYRINGE | INTRAVENOUS | Status: AC
  Filled 2024-05-07: qty 10

## 2024-05-07 MED ORDER — SODIUM CHLORIDE (PF) 0.9 % IJ SOLN
INTRAMUSCULAR | Status: AC
  Administered 2024-05-07: 10 mL
  Filled 2024-05-07: qty 10

## 2024-05-07 MED ORDER — 0.9 % SODIUM CHLORIDE (POUR BTL) OPTIME
TOPICAL | Status: DC | PRN
  Administered 2024-05-07 (×2): 1000 mL

## 2024-05-07 MED ORDER — CHLORHEXIDINE GLUCONATE 0.12 % MT SOLN
OROMUCOSAL | Status: AC
  Filled 2024-05-07: qty 15

## 2024-05-07 MED ORDER — BUPIVACAINE-EPINEPHRINE 0.5% -1:200000 IJ SOLN
INTRAMUSCULAR | Status: DC | PRN
  Administered 2024-05-07: 30 mL

## 2024-05-07 MED ORDER — ALBUMIN HUMAN 5 % IV SOLN: INTRAVENOUS | Status: DC | PRN

## 2024-05-07 MED ORDER — CHLORHEXIDINE GLUCONATE 0.12 % MT SOLN
15.0000 mL | Freq: Once | OROMUCOSAL | Status: AC
  Administered 2024-05-07: 15 mL via OROMUCOSAL

## 2024-05-07 MED ORDER — ONDANSETRON HCL 4 MG/2ML IJ SOLN
INTRAMUSCULAR | Status: DC | PRN
  Administered 2024-05-07: 4 mg via INTRAVENOUS

## 2024-05-07 MED ORDER — DEXAMETHASONE SOD PHOSPHATE PF 10 MG/ML IJ SOLN
INTRAMUSCULAR | Status: DC | PRN
  Administered 2024-05-07: 5 mg via INTRAVENOUS

## 2024-05-07 MED ORDER — PROPOFOL 10 MG/ML IV BOLUS
INTRAVENOUS | Status: DC | PRN
  Administered 2024-05-07: 50 mg via INTRAVENOUS

## 2024-05-07 MED ORDER — ONDANSETRON HCL 4 MG/2ML IJ SOLN
INTRAMUSCULAR | Status: AC
  Filled 2024-05-07: qty 2

## 2024-05-07 MED ORDER — SUCCINYLCHOLINE CHLORIDE 200 MG/10ML IV SOSY
PREFILLED_SYRINGE | INTRAVENOUS | Status: DC | PRN
  Administered 2024-05-07: 100 mg via INTRAVENOUS

## 2024-05-07 MED ORDER — SODIUM CHLORIDE 0.9% FLUSH
10.0000 mL | Freq: Two times a day (BID) | INTRAVENOUS | Status: DC
  Administered 2024-05-07: 20 mL
  Administered 2024-05-08 – 2024-05-12 (×6): 10 mL
  Administered 2024-05-13 – 2024-05-14 (×2): 20 mL
  Administered 2024-05-14 – 2024-05-18 (×9): 10 mL
  Administered 2024-05-19: 20 mL
  Administered 2024-05-19: 10 mL

## 2024-05-07 MED ORDER — GLYCERIN (LAXATIVE) 2 G RE SUPP
1.0000 | Freq: Once | RECTAL | Status: DC
  Filled 2024-05-07: qty 1

## 2024-05-07 MED ORDER — DILTIAZEM HCL-DEXTROSE 125-5 MG/125ML-% IV SOLN (PREMIX)
5.0000 mg/h | INTRAVENOUS | Status: DC
  Administered 2024-05-07: 5 mg/h via INTRAVENOUS
  Filled 2024-05-07 (×2): qty 125

## 2024-05-07 MED ORDER — METOPROLOL TARTRATE 5 MG/5ML IV SOLN
INTRAVENOUS | Status: AC
  Filled 2024-05-07: qty 5

## 2024-05-07 MED ORDER — ORAL CARE MOUTH RINSE: 15.0000 mL | Freq: Once | OROMUCOSAL | Status: AC

## 2024-05-07 NOTE — Anesthesia Postprocedure Evaluation (Signed)
 Anesthesia Post Note  Patient: Kyle Kelley  Procedure(s) Performed: LAPAROTOMY, EXPLORATORY     Patient location during evaluation: PACU Anesthesia Type: General Level of consciousness: awake Pain management: pain level controlled Vital Signs Assessment: post-procedure vital signs reviewed and stable Respiratory status: spontaneous breathing, nonlabored ventilation and respiratory function stable Cardiovascular status: blood pressure returned to baseline and stable Postop Assessment: no apparent nausea or vomiting Anesthetic complications: no Comments: CXR obtained in PACU. Results reviewed.   No notable events documented.  Last Vitals:  Vitals:   05/07/24 1630 05/07/24 1656  BP: 109/71 106/71  Pulse: 94 (!) 104  Resp: 15 16  Temp: 36.4 C 36.6 C  SpO2: 99% 100%    Last Pain:  Vitals:   05/07/24 1656  TempSrc: Oral  PainSc:                  Lynasia Meloche P Tera Pellicane

## 2024-05-07 NOTE — Plan of Care (Signed)
  Problem: Education: Goal: Knowledge of General Education information will improve Description: Including pain rating scale, medication(s)/side effects and non-pharmacologic comfort measures Outcome: Progressing   Problem: Clinical Measurements: Goal: Ability to maintain clinical measurements within normal limits will improve Outcome: Progressing Goal: Will remain free from infection Outcome: Progressing Goal: Diagnostic test results will improve Outcome: Progressing Goal: Respiratory complications will improve Outcome: Progressing Goal: Cardiovascular complication will be avoided Outcome: Progressing   Problem: Nutrition: Goal: Adequate nutrition will be maintained Outcome: Progressing   Problem: Coping: Goal: Level of anxiety will decrease Outcome: Progressing   Problem: Elimination: Goal: Will not experience complications related to urinary retention Outcome: Progressing   Problem: Pain Managment: Goal: General experience of comfort will improve and/or be controlled Outcome: Progressing   Problem: Safety: Goal: Ability to remain free from injury will improve Outcome: Progressing   Problem: Skin Integrity: Goal: Risk for impaired skin integrity will decrease Outcome: Progressing

## 2024-05-07 NOTE — Progress Notes (Signed)
 Called PCU 2C and gave report to [facility]. Transported and handed off pt to receiving RN.

## 2024-05-07 NOTE — Anesthesia Preprocedure Evaluation (Addendum)
 Anesthesia Evaluation  Patient identified by MRN, date of birth, ID band Patient awake    Reviewed: Allergy & Precautions, NPO status , Patient's Chart, lab work & pertinent test results  Airway Mallampati: III  TM Distance: >3 FB Neck ROM: Full    Dental no notable dental hx.    Pulmonary former smoker   Pulmonary exam normal        Cardiovascular hypertension, Pt. on home beta blockers + Peripheral Vascular Disease  + dysrhythmias Atrial Fibrillation + Valvular Problems/Murmurs AS  Rhythm:Irregular Rate:Tachycardia  ECHO: 1. Left ventricular ejection fraction, by estimation, is 55 to 60%. The  left ventricle has normal function. The left ventricle has no regional  wall motion abnormalities. Left ventricular diastolic parameters are  indeterminate.   2. Right ventricular systolic function is mildly reduced. The right  ventricular size is mildly enlarged.   3. The mitral valve is grossly normal. No evidence of mitral valve  regurgitation.   4. The aortic valve was not well visualized. Aortic valve regurgitation  is not visualized. Aortic valve sclerosis is present, with no evidence of  aortic valve stenosis.   5. The inferior vena cava is normal in size with greater than 50%  respiratory variability, suggesting right atrial pressure of 3 mmHg.     Neuro/Psych  PSYCHIATRIC DISORDERS      CVA    GI/Hepatic Neg liver ROS,,,  Endo/Other  negative endocrine ROS  hyponatremia  Renal/GU negative Renal ROS     Musculoskeletal negative musculoskeletal ROS (+)    Abdominal   Peds  Hematology  (+) Blood dyscrasia (Eliquis ), anemia   Anesthesia Other Findings Possible Perforated Ulcer  Reproductive/Obstetrics                              Anesthesia Physical Anesthesia Plan  ASA: 4  Anesthesia Plan: General   Post-op Pain Management:    Induction: Intravenous  PONV Risk Score and Plan:  2 and Treatment may vary due to age or medical condition, Ondansetron  and Dexamethasone   Airway Management Planned: Oral ETT  Additional Equipment:   Intra-op Plan:   Post-operative Plan: Possible Post-op intubation/ventilation  Informed Consent: I have reviewed the patients History and Physical, chart, labs and discussed the procedure including the risks, benefits and alternatives for the proposed anesthesia with the patient or authorized representative who has indicated his/her understanding and acceptance.     Dental advisory given  Plan Discussed with: CRNA  Anesthesia Plan Comments: (Potential arterial line placement discussed Potential central line placement discussed)         Anesthesia Quick Evaluation

## 2024-05-07 NOTE — Care Management Important Message (Signed)
 Important Message  Patient Details  Name: Kyle Kelley MRN: [id number] Date of Birth: November 14, 1945   Important Message Given:        Claretta Deed 05/07/2024, 11:13 AM

## 2024-05-07 NOTE — Transfer of Care (Signed)
 Immediate Anesthesia Transfer of Care Note  Patient: Kyle Kelley  Procedure(s) Performed: LAPAROTOMY, EXPLORATORY  Patient Location: PACU  Anesthesia Type:General  Level of Consciousness: awake, alert , and oriented  Airway & Oxygen Therapy: Patient Spontanous Breathing  Post-op Assessment: Report given to RN and Post -op Vital signs reviewed and stable  Post vital signs: Reviewed and stable  Last Vitals:  Vitals Value Taken Time  BP 96/81 05/07/24 15:17  Temp    Pulse 108 05/07/24 15:20  Resp 23 05/07/24 15:21  SpO2 88 % 05/07/24 15:20  Vitals shown include unfiled device data.  Last Pain:  Vitals:   05/07/24 1246  TempSrc: Oral  PainSc:          Complications: No notable events documented.

## 2024-05-07 NOTE — Progress Notes (Signed)
  Progress Note    05/07/2024 8:25 AM 3 Days Post-Op  Subjective:  no complaints  Vitals:   05/07/24 0531 05/07/24 0627  BP: 119/76 107/66  Pulse: 71 (!) 105  Resp: 20   Temp: 98 F (36.7 C)   SpO2: 96%     Physical Exam: Aaox3 Non labored respirations Left leg vac to suction Left foot well perfused  CBC    Component Value Date/Time   WBC 29.8 (H) 05/07/2024 0437   RBC 3.56 (L) 05/07/2024 0437   HGB 10.2 (L) 05/07/2024 0437   HCT 31.6 (L) 05/07/2024 0437   PLT 225 05/07/2024 0437   MCV 88.8 05/07/2024 0437   MCH 28.7 05/07/2024 0437   MCHC 32.3 05/07/2024 0437   RDW 16.0 (H) 05/07/2024 0437   LYMPHSABS 1.0 03/26/2024 0640   MONOABS 1.0 03/26/2024 0640   EOSABS 0.0 03/26/2024 0640   BASOSABS 0.0 03/26/2024 0640    BMET    Component Value Date/Time   NA 130 (L) 05/07/2024 0437   K 4.7 05/07/2024 0437   CL 100 05/07/2024 0437   CO2 16 (L) 05/07/2024 0437   GLUCOSE 110 (H) 05/07/2024 0437   BUN 30 (H) 05/07/2024 0437   CREATININE 0.85 05/07/2024 0437   CALCIUM  7.8 (L) 05/07/2024 0437   GFRNONAA >60 05/07/2024 0437   GFRAA >90 11/01/2012 0522    INR    Component Value Date/Time   INR 1.2 05/04/2024 0550     Intake/Output Summary (Last 24 hours) at 05/07/2024 0825 Last data filed at 05/07/2024 0656 Gross per 24 hour  Intake 661.26 ml  Output 300 ml  Net 361.26 ml     Assessment:  78 y.o. male is s/p debridement left below knee postsurgical wound   Plan: Wound VAC change yesterday, will plan to change later this week.  Restart Eliquis  when okay per GI and general surgery   Kyriana Yankee C. Sheree, MD [facility] [place] Office: [phone] Pager: [phone]  05/07/2024 8:25 AM

## 2024-05-07 NOTE — Op Note (Signed)
 Kyle Kelley 05/03/2024 - 05/07/2024   Pre-op Diagnosis: POSSIBLE PERFORATED ULCER     Post-op Diagnosis: PERFORATED DUODENAL ULCER  Procedure(s): LAPAROTOMY, EXPLORATORY GRAHAM PATCH REPAIR OF PERFORATED DUODENAL ULCER  Surgeon(s): Vernetta Berg, MD Lannette Rue, MD [facility] Resident  Anesthesia: General  Staff:  Circulator: Primus Leita HERO, RN Physician Assistant: Augustus Almarie RAMAN, PA-C Scrub Person: Storm Maryelizabeth CROME, RN Circulator Assistant: Welford Stevenson NOVAK, RN  Estimated Blood Loss: Minimal               Indications: This is a 78 year old gentleman with multiple chronic medical problems he developed possible GI bleed over the weekend.  He underwent an upper endoscopy and was found to have a duodenal ulcer.  The endoscopy was performed on 12/6.  Yesterday the patient developed worsening abdominal pain.  A CT scan of the chest, abdomen, and pelvis showed a small amount of free air and free fluid.  He was evaluated by general surgery and initially it was felt that this had walled itself off as he had minimal tenderness on exam.  Today, however, his white blood count has increased and he has developed increasing tachycardia and increasing abdominal pain so decision was made to proceed to the operating room for exploratory laparotomy  Findings: The patient was found to have a perforated duodenal ulcer just past the pylorus.  There is a large amount of fibrinous exudate in the right upper quadrant and into the retroperitoneum.  It appears that the ulcer a temporarily sealed itself off.  A Graham patch repair was performed on the ulcer  Procedure: The patient was brought to the operating and identified the correct patient.  He was placed upon the operating table and general anesthesia was induced.  His abdomen was then prepped and draped in usual sterile fashion.  We created a midline incision in the upper abdomen with scalpel.  We then took this down through the fascia with  electrocautery and then open the peritoneum the entire length of the incision.  I evaluated the stomach which was normal in appearance.  I then started following this past the pylorus and found a large amount of fibrinous exudate with small bowel and transverse colon stuck to the top of this area.  When I freed this off there was an abscess with turbid fluid and fibrinous exudate going into the right retroperitoneum.  I could then identify the small opening in the duodenum.  We removed a lot of the fibrinous exudate bluntly.  We were then able to close the perforation with interrupted 2-0 silk sutures and then using the long ends of the suture tied omentum down over the perforation as a Arlyss patch.  We then copes irrigated the right upper quadrant with normal saline.  We made a separate skin incision and placed a 19 French Blake drain through this and laid it into the retroperitoneum and then up to the area of the perforation.  This was sutured in place with a nylon suture.  We then copiously irrigated the rest of the abdomen with normal saline.  There was some fibrinous exudate in the pelvis.  No other abnormalities were identified.  We then closed the patient's midline fascia with a running #1 looped PDS suture and reinforced that in a few areas with interrupted #1 PDS sutures.  We then irrigated the skin with saline.  We anesthetized the fascia circumferentially with Marcaine .  We then packed the wound open with wet-to-dry saline soaked gauze.  The patient  tolerated the procedure well.  All the counts were correct at the end of the procedure.  He was then extubated in the operating room and taken in stable condition to the recovery room.          Vicenta Poli   Date: 05/07/2024  Time: 3:01 PM

## 2024-05-07 NOTE — Anesthesia Procedure Notes (Signed)
 Central Venous Catheter Insertion Performed by: Patrisha Bernardino SQUIBB, MD, anesthesiologist Start/End12/01/2024 1:45 PM, 05/07/2024 2:00 PM Patient location: OR. Preanesthetic checklist: patient identified, IV checked, site marked, risks and benefits discussed, surgical consent, monitors and equipment checked, pre-op evaluation, timeout performed and anesthesia consent Position: supine Lidocaine  1% used for infiltration and patient sedated Hand hygiene performed  and maximum sterile barriers used  Catheter size: 8 Fr Total catheter length 16. Central line was placed.Double lumen Procedure performed using ultrasound to evaluate access site. Ultrasound Notes:image(s) printed for medical record. Attempts: 1 Following insertion, dressing applied and line sutured. Post procedure assessment: blood return through all ports  Patient tolerated the procedure well with no immediate complications.

## 2024-05-07 NOTE — Progress Notes (Signed)
 PHARMACY - TOTAL PARENTERAL NUTRITION CONSULT NOTE   Indication: perforated viscus   Patient Measurements: Height: 6' 1 (185.4 cm) Weight: (S) 88 kg (194 lb 0.1 oz) (bed scale) IBW/kg (Calculated) : 79.9 TPN AdjBW (KG): 77.6 Body mass index is 25.6 kg/m. Usual Weight: ~78 kg (previous weights between 159 - 171 lbs over past 6 months)  Assessment:  78 y.o. male with history of atrial fibrillation and history of prostate cancer s/p prostatectomy and radiation therapy who underwent debridement of left below knee postsurgical wound 12/06 and was found to have severe anemia with a hemoglobin of 6.2 post-op. Patient states that for the past 3 months he has noticed that stools have been unusually dark, black, usually 1 to 2/day.   GI was consulted 12/06 due to persistent abdominal pain post-procedure prompting CT scan. EGD was notable for non-bleeding ulcer in the duodenal bulb. CT with small volume pneumoperitoneum tracking near the duodenum. Patient has been started on BID PPI and empiric Zosyn . Surgery recommends bowel rest with NGTS, TPN for nutrtion while NPO. Pharmacy consulted to initiate TPN.  Glucose / Insulin : 02/2024 A1c 5.7%; CBGs controlled, no insulin  PTA Electrolytes: K 4.7, Mg 2.2, Phos 3.2, Na 130, CoCa 9.6 Renal: Scr 0.85, BUN 30 Hepatic: LFTs wnl, Tbili 0.8, Alb 1.7, bsl TG 02/2024 32 Intake / Output; MIVF: NS @ 69mL/hr; UOP not entirely charted, LBM 12/4  GI Imaging: 12/08 CT: small volume pneumoperitoneum suspicious for perforated duodenal ulcer, small volume free fluid with layering hyperattenuation in the lower abdomen/pelvis, which may represent hemoperitoneum GI Surgeries / Procedures:   Central access: (05/07/24) TPN start date: 05/07/24  Nutritional Goals: Goal TPN rate is 80 mL/hr (provides 117 g of protein and ~1960 kcals per day)  RD Assessment: pending    Current Nutrition:  NPO 12/09 >> NGTS 12/09 >>  Plan:  Start TPN at 40 mL/hr at  1800 Electrolytes in TPN: Na 50 mEq/L, K 25 mEq/L, Ca 5 mEq/L, Mg 5 mEq/L, and Phos 10 mmol/L. Cl:Ac 1:1 Add standard MVI and trace elements to TPN Initiate Sensitive q6h SSI and adjust as needed  Reduce MIVF to 40 mL/hr at 1800 Monitor TPN labs on Mon/Thurs, daily PRN  Thank you for allowing pharmacy to be a part of this patient's care.  Shelba Collier, PharmD, BCPS Clinical Pharmacist

## 2024-05-07 NOTE — Plan of Care (Signed)
   Problem: Activity: Goal: Risk for activity intolerance will decrease Outcome: Progressing   Problem: Nutrition: Goal: Adequate nutrition will be maintained Outcome: Progressing   Problem: Coping: Goal: Level of anxiety will decrease Outcome: Progressing   Problem: Elimination: Goal: Will not experience complications related to bowel motility Outcome: Progressing Goal: Will not experience complications related to urinary retention Outcome: Progressing

## 2024-05-07 NOTE — Progress Notes (Signed)
 [facility] Progress Note  3 Days Post-Op  Subjective: CC:  Rounded on patient late morning to re-check exam in the setting of pneumoperitoneum.  Patient is somnolent. Reports slight increase in distention. Some right upper abdominal pain. No vomiting. No BM. Wife at bedside tearful and expresses concern about her husbands physical decline over the last 2 weeks. Last significant PO intake was reportedly Thursday (5 days ago).  Objective: Vital signs in last 24 hours: Temp:  [97.2 F (36.2 C)-98.2 F (36.8 C)] 97.2 F (36.2 C) (12/09 1138) Pulse Rate:  [71-125] 125 (12/09 1138) Resp:  [15-20] 15 (12/09 1138) BP: (100-125)/(58-76) 125/71 (12/09 1138) SpO2:  [95 %-99 %] 95 % (12/09 1138) Weight:  [88 kg] 88 kg (12/09 0500) Last BM Date : 05/02/24  Intake/Output from previous day: 12/08 0701 - 12/09 0700 In: 661.3 [P.O.:472; I.V.:139.3; IV Piggyback:50] Out: 300 [Urine:300] Intake/Output this shift: No intake/output data recorded.  PE: Gen:  Alert, appears acutely and chronically ill, somnolent but arouses appropriately to voice/touch to answer questions. Card:  tachycardic, sinus; pitting edema of the RLE, wound VAC LLE, BUE edema present as well Pulm:  intermittently labored ORA  Abd: Soft, moderate distention, diffusely tender in the upper abdomen, more severe in RUQ, without guarding or peritonitis, +tympany, previous laparotomy scar noted.  GU: normal penile anatomy, urinal present Skin: cold, ecchymotic BUE, pedal and radial pulses palpable 1+  Psych: A&Ox3, somnolent but oriented, appropriate speech  Lab Results:  Recent Labs    05/06/24 1948 05/07/24 0437  WBC 27.2* 29.8*  HGB 11.1* 10.2*  HCT 34.8* 31.6*  PLT 232 225   BMET Recent Labs    05/06/24 2122 05/07/24 0437  NA 128* 130*  K 5.0 4.7  CL 99 100  CO2 23 16*  GLUCOSE 146* 110*  BUN 34* 30*  CREATININE 0.88 0.85  CALCIUM  7.5* 7.8*   PT/INR No results for input(s): LABPROT, INR  in the last 72 hours. CMP     Component Value Date/Time   NA 130 (L) 05/07/2024 0437   K 4.7 05/07/2024 0437   CL 100 05/07/2024 0437   CO2 16 (L) 05/07/2024 0437   GLUCOSE 110 (H) 05/07/2024 0437   BUN 30 (H) 05/07/2024 0437   CREATININE 0.85 05/07/2024 0437   CALCIUM  7.8 (L) 05/07/2024 0437   PROT 5.2 (L) 05/07/2024 0437   PROT 7.6 02/01/2022 1110   ALBUMIN  1.7 (L) 05/07/2024 0437   ALBUMIN  4.5 02/01/2022 1110   AST 23 05/07/2024 0437   ALT 20 05/07/2024 0437   ALKPHOS 49 05/07/2024 0437   BILITOT 0.8 05/07/2024 0437   BILITOT 0.5 02/01/2022 1110   GFRNONAA >60 05/07/2024 0437   GFRAA >90 11/01/2012 0522   Lipase  No results found for: LIPASE    Anti-infectives: Anti-infectives (From admission, onward)    Start     Dose/Rate Route Frequency Ordered Stop   05/07/24 0000  piperacillin -tazobactam (ZOSYN ) IVPB 3.375 g        3.375 g 12.5 mL/hr over 240 Minutes Intravenous Every 8 hours 05/06/24 2241     05/03/24 0842  ceFAZolin  (ANCEF ) IVPB 2g/100 mL premix        2 g 200 mL/hr over 30 Minutes Intravenous 30 min pre-op 05/03/24 9157 05/03/24 1157        Assessment/Plan  78 y/o M with MMP listed below now with pneumoperitoneum after EGD yesterday for UGIB (gastritis, duodenal ulcer).  - he is currently afebrile, sinus tachycardia, normal BP -  based on his somnolence, tachycardia, and increased distention and tenderness on abdominal exam I am worried that he may need an operation today. Continue NG to LIWS. Will review with. Dr. Vernetta  - agree with transfer to progressive care - recommend PICC/TPN given prolonged NPO status  FEN: NPO, IVF per primary ID: Zosyn  VTE: SCD's  Foley: none at present Dispo: progressive care  LLE limb ischemia s/p thrombectomy and subsequent I&D infected hematoma/VAC placement; VVS would like to start plavix  once hgb stable Afib - anticoag was held due to GIB. heparin  when hemodynamically stable, on Eliquis  at home Aortic  stenosis HTN HLD PMH CVA - no residual deficits. Per wife, patient independent of ADLs 2 weeks ago   LOS: 4 days   I reviewed nursing notes, Consultant vascular, cardiology notes, hospitalist notes, last 24 h vitals and pain scores, last 48 h intake and output, last 24 h labs and trends, and last 24 h imaging results.  This care required high  level of medical decision making.   Almarie Pringle, PA-C [facility] Please see Amion for pager number during day hours 7:00am-4:30pm

## 2024-05-07 NOTE — Progress Notes (Addendum)
 Progress Note   Patient: Kyle Kelley DOB: 1945-09-18 DOA: 05/03/2024     4 DOS: the patient was seen and examined on 05/07/2024    Brief hospital course: Kyle Kelley is a 78 year old male with extensive history of atrial fibrillation (on Eliquis /recently held), vasculopath, with acute on chronic ischemic left lower extremity nonhealing wound, HTN, HLD, Aortic stenosis, CVA, prostate cancer s/p prostatectomy 2014 who presented on 12/5 to the vascular team for follow-up left lower extremity wound.   It appears the patient presented to ED on 03/25/2024 for acute left limb ischemia, vascular was consulted underwent a left lower extremity thrombectomy, and angioplasty.,  Left SFA stent placement on 05/26/2024 by Dr. Sheree. Per vascular surgery on 04/29/2024 follow-up visit was noted worsening wound dehiscence-patient was brought back 05/03/2024 for debridement in OR and placement of wound VAC. Patient Eliquis  has been held since 04/29/2024.  Labs on 12/5 revealed hemoglobin of 6.2.  Patient was admitted for evaluation of the anemia and was transfused.  Gastroenterology was consulted.  Patient underwent EGD on 12/6.  Patient had persistent abdominal pain after the procedure.  On 12/7, he developed lactic acidosis and leukocytosis.  Responded to fluids, lactic acid trended down.  Patient denied severe symptoms.  On 12/8, patient noted to have worsening leukocytosis.  CT C/A/P ordered and revealed free air, likely due to perforated duodenal ulcer.  Surgery was consulted.  Initial recommendation was for conservative management given small amount of free air, minimal symptoms.  Patient reevaluated by surgical team on 12/9 and decision was made to take patient for laparotomy.  Assessment and Plan:  Perforated duodenal ulcer Duodenal ulcer was noted on EGD on 12/7. CT C/A/P done on 12/9 revealed, among other findings, a small volume of free air predominantly within the right upper quadrant  that appeared to track to the proximal duodenum.  Also a small volume of free fluid with layering hyperattenuation in the lower abdomen/pelvis which may represent hemoperitoneum. General surgery consulted. Initial conservative management with n.p.o., NG tube, held heparin , started antibiotics.  TPN was advised. Reevaluated today, 12/9, by general surgery. - Patient scheduled for laparotomy today.  Lactic acidosis, resolved Likely in the setting of perforated viscus and possible sepsis. Lactic acid peaked at 3.7.. Resolved with IV fluids. -Will monitor closely.  Leukocytosis Elevation in white cell count from 3.3 to >20. Likely due to perforated viscus, possible peritonitis. -Started on Zosyn .  Hypoxia No baseline oxygen use. Now hypoxic and currently requiring 2 to 3 L/min supplemental oxygen. Likely volume overload due to aggressive volume repletion.  - Follow-up CT.  GI bleed Patient presented with anemia and melena. Gastroenterology consulted. Patient underwent EGD on 12/6 which revealed a large duodenal ulcer and no active bleeding. Biopsies were taken. Per GI recs, anticoagulation was held. -Patient was started on anticoagulation challenged with heparin  GTT on 12/8 but this was discontinued with diagnosis of perforated duodenal ulcer. -Continue IV Protonix  twice daily.    Symptomatic anemia Acute on chronic anemia due to blood loss -Mildly symptomatic with generalized weakness, sporadic dizziness -Due to GI bleed as noted above.  Gastroenterology was consulted and patient underwent EGD which revealed a duodenal ulcer. -Denies any chest pain or shortness of breath -Hemoglobin noted to be 6.2 last hemoglobin in October 2025 was 11.3 -Transfused with 2U PRBC -Anemia workup shows low iron of 21 with saturation of 5.  Ferritin not checked. - B12 907, folate 18. - Will continue to trend.  Atrial fibrillation (HCC) Patient in  RVR 12/7, 12/8. Patient on diltiazem  and  metoprolol  at home. -Given current n.p.o. status, IV metoprolol  scheduled with parameters.  Hyponatremia. Likely hypervolemic due to volume repletion.  - Will monitor.   Critical limb ischemia of left lower extremity (HCC) - Vascular team following closely  - Presented to ED on 03/25/24 for acute left limb ischemia, - 12/28 underwent a left lower extremity thrombectomy, and angioplasty. Left SFA stent placement on by Dr. Sheree.  - 12/1  f/up for infection and hematoma, noted worsening wound dehiscence - 12/5 patient was brought back for debridement in OR and and placement of wound VAC.  -Further management per vascular surgery - Patient Eliquis  has been held since 04/29/2024.   -Will continue to hold anticoagulation in the setting of surgery, perforated viscus.  ICH (intracerebral hemorrhage) (HCC) Remote history of intracranial hemorrhage -Due to A-fib patient has been anticoagulated on Eliquis , which has been held since 04/29/2024  Mixed hyperlipidemia LFTs within normal limits. -Holding home oral medications.  Aortic stenosis - Last echo 03/18/2024-reviewed EF 55-60%, normal LV function, mildly enlarged right ventricle, mildly reduced RV function, aortic valve was not well visualized no significant stenosis was reported -Currently asymptomatic, stable - Per cardiology, recommend repeat echocardiogram in the next 1 to 2 months as outpatient for high-quality imaging to reassess severity of aortic valve stenosis.  Essential hypertension, benign - Currently blood pressure stable -On metoprolol  and diltiazem  at home. - Currently on IV metoprolol  as indicated above.  History of stroke without residual deficits - No new focal neurological findings -Continue medications as indicated above.  Stage II sacral decubitus ulcer Present on admission. - Pressure ulcer care was given.      Subjective: This morning, patient had no complaints.  He says the NG tube is slightly  uncomfortable but his abdominal pain is better.  Discussed the plan of care at length with the patient and his wife.   Physical Exam: BP 134/85   Pulse (!) 120   Temp 98.1 F (36.7 C) (Oral)   Resp 20   Ht 6' 1 (1.854 m)   Wt (S) 88 kg Comment: bed scale  SpO2 97%   BMI 25.60 kg/m    General: Alert, oriented X3  Eyes: Pupils equal, reactive  Oral cavity: Moist mucous membranes  Head: Atraumatic, normocephalic  Neck: supple  Chest: Bilateral basal crackles CVS: Irregularly irregular, tachycardia Abd: No distention, soft, non tender Extr: Pedal edema MSK: Left lower extremity with wound VAC + Neurological: Grossly intact.    Data Reviewed:    Latest Ref Rng & Units 05/07/2024    4:37 AM 05/06/2024    7:48 PM 05/06/2024    4:19 AM  CBC  WBC 4.0 - 10.5 K/uL 29.8  27.2  24.9   Hemoglobin 13.0 - 17.0 g/dL 89.7  88.8  88.2   Hematocrit 39.0 - 52.0 % 31.6  34.8  36.3   Platelets 150 - 400 K/uL 225  232  254       Latest Ref Rng & Units 05/07/2024    4:37 AM 05/06/2024    9:22 PM 05/06/2024    4:19 AM  BMP  Glucose 70 - 99 mg/dL 889  853  899   BUN 8 - 23 mg/dL 30  34  33   Creatinine 0.61 - 1.24 mg/dL 9.14  9.11  9.08   Sodium 135 - 145 mmol/L 130  128  132   Potassium 3.5 - 5.1 mmol/L 4.7  5.0  5.1  Chloride 98 - 111 mmol/L 100  99  100   CO2 22 - 32 mmol/L 16  23  24    Calcium  8.9 - 10.3 mg/dL 7.8  7.5  7.9      Family Communication: Spoke with patient's wife at bedside  Disposition: Status is: Inpatient Remains inpatient appropriate because: Perforated viscus requiring surgery     Author: MDALA-GAUSI, Mildred Bollard AGATHA, MD 05/07/2024 12:58 PM  For on call review [email or web address].

## 2024-05-07 NOTE — Progress Notes (Signed)
 I was asked to assist with NGT placement. 49fr NGT placed in right nare, confirmed with auscultation and awaiting PKUB for confirmation. Pt tolerated well. NGT placed to LIWS.

## 2024-05-07 NOTE — Progress Notes (Signed)
 Peripherally Inserted Central Catheter Placement  The IV Nurse has discussed with the patient and/or persons authorized to consent for the patient, the purpose of this procedure and the potential benefits and risks involved with this procedure.  The benefits include less needle sticks, lab draws from the catheter, and the patient may be discharged home with the catheter. Risks include, but not limited to, infection, bleeding, blood clot (thrombus formation), and puncture of an artery; nerve damage and irregular heartbeat and possibility to perform a PICC exchange if needed/ordered by physician.  Alternatives to this procedure were also discussed.  Bard Power PICC patient education guide, fact sheet on infection prevention and patient information card has been provided to patient /or left at bedside.    PICC Placement Documentation  PICC Double Lumen 05/07/24 Right Brachial 37 cm 0 cm (Active)  Indication for Insertion or Continuance of Line Administration of hyperosmolar/irritating solutions (i.e. TPN, Vancomycin, etc.) 05/07/24 2140  Exposed Catheter (cm) 0 cm 05/07/24 2140  Site Assessment Clean, Dry, Intact 05/07/24 2140  Lumen #1 Status Saline locked;Flushed;Blood return noted 05/07/24 2140  Lumen #2 Status Saline locked;Flushed;Blood return noted 05/07/24 2140  Dressing Type Transparent;Securing device 05/07/24 2140  Dressing Status Antimicrobial disc/dressing in place;Clean, Dry, Intact 05/07/24 2140  Line Care Connections checked and tightened 05/07/24 2140  Line Adjustment (NICU/IV Team Only) No 05/07/24 2140  Dressing Intervention New dressing;Adhesive placed at insertion site (IV team only) 05/07/24 2140  Dressing Change Due 05/14/24 05/07/24 2140       Tejay Hubert, Cherene Place 05/07/2024, 9:42 PM

## 2024-05-07 NOTE — Progress Notes (Signed)
 Right DL PICC inserted tip CAJ.  Glenys, RN aware that TPN cannot be moved from internal jugular to new PICC.  New TPN will be hung to PICC 12/10.

## 2024-05-07 NOTE — Progress Notes (Signed)
 PICC order received. Per Alm, RN: pt may transfer to progressive unit soon. Awaiting response from receiving RN to coordinate PICC insertion.

## 2024-05-07 NOTE — Anesthesia Procedure Notes (Signed)
 Procedure Name: Intubation Date/Time: 05/07/2024 1:39 PM  Performed by: Elby Raelene SAUNDERS, CRNAPre-anesthesia Checklist: Patient identified, Emergency Drugs available, Suction available and Patient being monitored Patient Re-evaluated:Patient Re-evaluated prior to induction Oxygen Delivery Method: Circle System Utilized Preoxygenation: Pre-oxygenation with 100% oxygen Induction Type: IV induction Ventilation: Mask ventilation without difficulty Laryngoscope Size: Miller and 2 Grade View: Grade I Tube type: Oral Tube size: 7.5 mm Number of attempts: 1 Airway Equipment and Method: Stylet and Bite block Placement Confirmation: ETT inserted through vocal cords under direct vision, positive ETCO2 and breath sounds checked- equal and bilateral Secured at: 23 cm Tube secured with: Tape Dental Injury: Teeth and Oropharynx as per pre-operative assessment

## 2024-05-07 NOTE — Progress Notes (Signed)
 Occupational Therapy Treatment Patient Details Name: Kyle Kelley MRN: [id number] DOB: [birth year] Today's Date: 05/07/2024   History of present illness Kyle Kelley is a 78 y.o. male admitted 05/03/24 with symptomatic anemia. He has been followed closely by vascular team outpatient and at visit 12/1 noted worsening wound dehiscence brought in 12/5 for debridement in OR and placement of wound VAC. Pt underwent EGD 12/6, which showed  gastritis, duodenal ulcers clean base nonbleeding, random gastric biopsies r/o h-pylori. PMHx: atrial fibrillation (on Eliquis /recently held), vasculopath, acute on chronic ischemic LLE nonhealing wound, HTN, HLD, aortic stenosis, CVA, prostate cancer s/p prostatectomy 2014, and PVD.   OT comments  Patient with recent NGT placement.  Patient eager to participate with OT with CGA to get to EOB and max assist to donn socks. Patient able to ambulate to sink with CGA and able to perform grooming tasks while standing. Patient requiring seated rest break and stood again to use urinal while standing before returning to bed to prepare for move to new unit.  Discharge recommendations continue to be appropriate. Acute OT to continue to follow to address established goals.       If plan is discharge home, recommend the following:  A little help with walking and/or transfers;A little help with bathing/dressing/bathroom;Assistance with cooking/housework;Assist for transportation;Help with stairs or ramp for entrance   Equipment Recommendations  None recommended by OT    Recommendations for Other Services      Precautions / Restrictions Precautions Precautions: Fall;Other (comment) (NG tube) Recall of Precautions/Restrictions: Intact Precaution/Restrictions Comments: wound vac LLE Restrictions Weight Bearing Restrictions Per Provider Order: No       Mobility Bed Mobility Overal bed mobility: Needs Assistance Bed Mobility: Supine to Sit, Sit to Supine     Supine to  sit: Supervision Sit to supine: Min assist   General bed mobility comments: assistance with BLE to return to bed    Transfers Overall transfer level: Needs assistance Equipment used: Rolling walker (2 wheels) Transfers: Sit to/from Stand Sit to Stand: Contact guard assist           General transfer comment: cues for hand placement and CGA from EOB and recliner     Balance Overall balance assessment: Needs assistance Sitting-balance support: Bilateral upper extremity supported, Feet supported Sitting balance-Leahy Scale: Fair Sitting balance - Comments: EOB   Standing balance support: Bilateral upper extremity supported, During functional activity, Reliant on assistive device for balance, Single extremity supported Standing balance-Leahy Scale: Fair Standing balance comment: single extremity support while standing at sink for grooming tasks                           ADL either performed or assessed with clinical judgement   ADL Overall ADL's : Needs assistance/impaired     Grooming: Wash/dry hands;Wash/dry face;Oral care;Contact guard assist;Standing Grooming Details (indicate cue type and reason): at sink     Lower Body Bathing: Moderate assistance;Sit to/from stand Lower Body Bathing Details (indicate cue type and reason): peri area Upper Body Dressing : Set up   Lower Body Dressing: Maximal assistance;Sitting/lateral leans Lower Body Dressing Details (indicate cue type and reason): socks                    Extremity/Trunk Assessment              Vision       Perception     Praxis     Communication Communication  Communication: Impaired Factors Affecting Communication: Hearing impaired   Cognition Arousal: Alert Behavior During Therapy: WFL for tasks assessed/performed Cognition: No apparent impairments                               Following commands: Impaired Following commands impaired: Follows one step commands  with increased time      Cueing   Cueing Techniques: Verbal cues, Gestural cues  Exercises      Shoulder Instructions       General Comments wife present and supportive    Pertinent Vitals/ Pain       Pain Assessment Pain Assessment: Faces Faces Pain Scale: Hurts a little bit Pain Location: abd Pain Descriptors / Indicators: Sore Pain Intervention(s): Limited activity within patient's tolerance, Monitored during session, Repositioned  Home Living                                          Prior Functioning/Environment              Frequency  Min 2X/week        Progress Toward Goals  OT Goals(current goals can now be found in the care plan section)  Progress towards OT goals: Progressing toward goals  Acute Rehab OT Goals Patient Stated Goal: to get better OT Goal Formulation: With patient/family Time For Goal Achievement: 05/19/24 Potential to Achieve Goals: Good ADL Goals Pt Will Perform Grooming: with modified independence;standing Pt Will Perform Lower Body Bathing: with modified independence;with adaptive equipment;sitting/lateral leans;sit to/from stand Pt Will Perform Lower Body Dressing: with modified independence;with adaptive equipment;sitting/lateral leans;sit to/from stand Pt Will Transfer to Toilet: with supervision;ambulating;regular height toilet;grab bars Pt Will Perform Toileting - Clothing Manipulation and hygiene: with modified independence;sitting/lateral leans;sit to/from stand  Plan      Co-evaluation                 AM-PAC OT 6 Clicks Daily Activity     Outcome Measure   Help from another person eating meals?: None Help from another person taking care of personal grooming?: A Little Help from another person toileting, which includes using toliet, bedpan, or urinal?: A Lot Help from another person bathing (including washing, rinsing, drying)?: A Lot Help from another person to put on and taking off regular  upper body clothing?: A Little Help from another person to put on and taking off regular lower body clothing?: A Lot 6 Click Score: 16    End of Session Equipment Utilized During Treatment: Gait belt;Rolling walker (2 wheels)  OT Visit Diagnosis: Unsteadiness on feet (R26.81);Other abnormalities of gait and mobility (R26.89);Muscle weakness (generalized) (M62.81)   Activity Tolerance Patient tolerated treatment well   Patient Left in bed;with call bell/phone within reach;with bed alarm set;with family/visitor present   Nurse Communication Mobility status;Other (comment) (to address NG tube and IV site bleeding)        Time: 9065-8989 OT Time Calculation (min): 36 min  Charges: OT General Charges $OT Visit: 1 Visit OT Treatments $Self Care/Home Management : 23-37 mins  [name], OTA Acute Rehabilitation Services  Office [phone]   [name] 05/07/2024, 1:39 PM

## 2024-05-07 NOTE — Progress Notes (Addendum)
 Initial Nutrition Assessment  DOCUMENTATION CODES:   Not applicable  INTERVENTION:  Parenteral Nutrition - titrate to meet estimated nutrition needs.  Kcal: 2100-2300 kcals Protein: 100-115g   Add thiamine 100mg  daily x5 days d/t refeeding risk MVI w/ minerals to TPN   Monitor magnesium, potassium, and phosphorus daily for at least 3 days, MD to replete as needed  Monitor outcome of exlap and adjust estimated calorie/protein needs based off outcome and NFPE  Monitor diet advancement/tolerance for ability to wean nutrition support  NUTRITION DIAGNOSIS:  Inadequate oral intake related to inability to eat as evidenced by NPO status.  GOAL:  Patient will meet greater than or equal to 90% of their needs  MONITOR:  Diet advancement, Labs, I & O's, Weight trends  REASON FOR ASSESSMENT:   Consult New TPN/TNA  ASSESSMENT:   Pt with PMH significant for: afib, aortic stenosis, CVA, prostate cancer s/p prostatectomy (2014) and radiation therapy, sigmoid diverticulosis, HTN, and dyslipidemia. Has acute on chronic ischemic left lower extremity nonhealing wound. Presented to 12/05 for vascular r/t LLE wound for debridement and wound vac placement. Developed severe anemia (Hgb 6.2) post op and admitted for evaluation and was transfused. GI consulted and he underwent EGD on 12/06. He developed persistent abdominal pain post procedure and suspected pneumoperitoneum 2/2 perforated duodenal ulcer. Emergently taken to OR for exlap.  12/05 - admitted; clear liquid diet 12/06 - NPO; EGD: non-bleeding duodenal ulcer; adv GI soft diet 12/07 - persistent abd pain post procedure 12/08 - CT A/P: pneumoperitoneum; NPO 12/09 - OR: exlap w/ graham patch repair 2/2 perforated duodenal ulcer  Notably with visit to ED back on 03/25/2024 for left lower limb ischemia at which time he underwent LLE thrombectomy and angioplasty. Left SFA stent placement on 03/26/2024. Noted with worsening wound dehiscence on  04/29/2024 follow up at vascular clinic. Recommended for debridement in OR and placement of wound vac, which he received on 05/03/2024.  CT chest/abdomen/pelvis on 12/08 initially showed small amount of free air and fluid. Surgery initially felt ulcer walled itself off, however today he developed worsening abdominal pain along with leukocytosis and tachycardia. Went for emergent exlap which showed perforated duodenal ulcer just past the pylorus. Graham patch repair performed. Consult for new TPN given prolonged poor PO intake and expected NPO status. Has NGT in place to low intermittent suction.   Average Meal Intake 12/06: 100% x1 documented offering (cream of potato soup, spaghetti noodles w/ meat sauce, broccoli cuts and dinner roll) 12/07: 100% x2 documented meals 12/08: 100% x3 documented offerings  Met with patient and wife at bedside as he was being prepared for emergent surgery. Wife verbalizes concern about his somnolence and asking whether surgery truly indicated at this time. Discussed potential risks if surgery not performed. She verbalized understanding. States appetite has been poor since admission, although documented with 100% intake at multiple offerings of GI soft diet between 12/06-12/08. Cannot verify with RN as he was just transferred to unit. Patient's wife endorses he had been eating normal up until admission on Friday (12/05). Given sporadic nature of his PO intake as well as expected prolonged NPO status, consult for TPN initiated.   24 Hour Recall B: oatmeal w/ walnuts and blueberries, coffee L: egg sandwich and side salad w/ water  D: protein, starch, vegetable w/ water  Snacks: soft tacos occasionally   He does not consume ONS at home, but she states, We can start to if needed. He had no issues chewing or swallowing at baseline and had  a formed BM every 1-2 days. Dentition appears adequate, however could not closely assess or complete NFPE due to preparation ongoing for  emergent surgery.   Admit Weight: 77.6 kg Current Weight: 88 kg - ?accuracy  Per chart review, patient with 9.4% weight gain in last 5-6 weeks. This is considered significant. Questionable weight gain over last three days as well. Admission weight appears pulled from last encounter on 12/01. Noted with moderate pitting edema to RUE with non-pitting to LUE, however unable to assess at bedside due to inability to complete NFPE. Also documented with deep pitting edema to BLEs. Patient's wife endorsed UBW around 161lbs. Current body weight around 194lbs. Will re-assess trend when next weight collected. Last bowel movement noted on 12/04. Will continue to monitor post-op.   Drain/Lines: R internal jugular: CVC, double lumen  NGT (gastric): no documented output R abdomen: JP drain (19Fr): 115 ml x12 hours LLE: wound vac: 140 ml x12 hours Foley catheter UOP: 300 ml x24 hours - ?accuracy   Meds: SS Novolog , pantoprazole , IV ABX  Given the sporadic nature of his recent PO intake as well as discrepancies in level of intake (documentation vs report), will monitor for refeeding. Add thiamine and MVI to TPN. Remains hyponatremic. Monitor kidney function. BUN elevated, but down trending. Creatinine within desirable range.   Labs:  Na+ 128>130 (L) K+ 4.7 (wdl) BUN 30 Crt 0.85 PHOS 3.2 (wdl) Mg 2.2 (wdl) WBC 29.8 (H) CBGs 110-146 x24 hours A1c 5.7 (02/2024)  NUTRITION - FOCUSED PHYSICAL EXAM:  Unable to complete, as patient being taken emergently to surgery. Will complete on follow up.  Diet Order:   Diet Order             Diet NPO time specified Except for: Ice Chips, Sips with Meds  Diet effective now            EDUCATION NEEDS:   No education needs have been identified at this time  Skin:  Skin Assessment: Skin Integrity Issues: Skin Integrity Issues:: Stage II Stage II: sacrum  Last BM:  12/04  Height:  Ht Readings from Last 1 Encounters:  05/04/24 6' 1 (1.854 m)    Weight:  Wt Readings from Last 1 Encounters:  05/07/24 (S) 88 kg   Ideal Body Weight:  83.6 kg  BMI:  Body mass index is 25.6 kg/m.  Estimated Nutritional Needs:   Kcal:  2100-2300 kcals  Protein:  100-115g  Fluid:  >2L/day  Blair Deaner MS, RD, LDN Registered Dietitian Clinical Nutrition RD Inpatient Contact Info in Amion

## 2024-05-08 ENCOUNTER — Encounter (HOSPITAL_COMMUNITY): Payer: Self-pay | Admitting: Surgery

## 2024-05-08 ENCOUNTER — Inpatient Hospital Stay (HOSPITAL_COMMUNITY)

## 2024-05-08 DIAGNOSIS — R609 Edema, unspecified: Secondary | ICD-10-CM

## 2024-05-08 DIAGNOSIS — D649 Anemia, unspecified: Secondary | ICD-10-CM | POA: Diagnosis not present

## 2024-05-08 LAB — CBC
HCT: 29.3 % — ABNORMAL LOW (ref 39.0–52.0)
Hemoglobin: 9.1 g/dL — ABNORMAL LOW (ref 13.0–17.0)
MCH: 28.5 pg (ref 26.0–34.0)
MCHC: 31.1 g/dL (ref 30.0–36.0)
MCV: 91.8 fL (ref 80.0–100.0)
Platelets: 173 K/uL (ref 150–400)
RBC: 3.19 MIL/uL — ABNORMAL LOW (ref 4.22–5.81)
RDW: 16.5 % — ABNORMAL HIGH (ref 11.5–15.5)
WBC: 19.2 K/uL — ABNORMAL HIGH (ref 4.0–10.5)
nRBC: 0.3 % — ABNORMAL HIGH (ref 0.0–0.2)

## 2024-05-08 LAB — BASIC METABOLIC PANEL WITH GFR
Anion gap: 8 (ref 5–15)
BUN: 22 mg/dL (ref 8–23)
CO2: 25 mmol/L (ref 22–32)
Calcium: 7.7 mg/dL — ABNORMAL LOW (ref 8.9–10.3)
Chloride: 100 mmol/L (ref 98–111)
Creatinine, Ser: 0.84 mg/dL (ref 0.61–1.24)
GFR, Estimated: >60 mL/min (ref >60)
Glucose, Bld: 149 mg/dL — ABNORMAL HIGH (ref 70–99)
Potassium: 4.4 mmol/L (ref 3.5–5.1)
Sodium: 133 mmol/L — ABNORMAL LOW (ref 135–145)

## 2024-05-08 LAB — BRAIN NATRIURETIC PEPTIDE: B Natriuretic Peptide: 351.1 pg/mL — ABNORMAL HIGH (ref 0.0–100.0)

## 2024-05-08 LAB — PHOSPHORUS: Phosphorus: 2.8 mg/dL (ref 2.5–4.6)

## 2024-05-08 LAB — HEMOGLOBIN AND HEMATOCRIT, BLOOD
HCT: 29.6 % — ABNORMAL LOW (ref 39.0–52.0)
Hemoglobin: 9.4 g/dL — ABNORMAL LOW (ref 13.0–17.0)

## 2024-05-08 LAB — ALBUMIN: Albumin: 1.8 g/dL — ABNORMAL LOW (ref 3.5–5.0)

## 2024-05-08 LAB — MAGNESIUM: Magnesium: 2 mg/dL (ref 1.7–2.4)

## 2024-05-08 LAB — GLUCOSE, CAPILLARY
Glucose-Capillary: 124 mg/dL — ABNORMAL HIGH (ref 70–99)
Glucose-Capillary: 125 mg/dL — ABNORMAL HIGH (ref 70–99)
Glucose-Capillary: 145 mg/dL — ABNORMAL HIGH (ref 70–99)
Glucose-Capillary: 148 mg/dL — ABNORMAL HIGH (ref 70–99)

## 2024-05-08 LAB — FERRITIN: Ferritin: 60 ng/mL (ref 24–336)

## 2024-05-08 MED ORDER — TRACE MINERALS CU-MN-SE-ZN 300-55-60-3000 MCG/ML IV SOLN
INTRAVENOUS | Status: AC
  Filled 2024-05-08: qty 768

## 2024-05-08 MED ORDER — TRACE MINERALS CU-MN-SE-ZN 300-55-60-3000 MCG/ML IV SOLN: INTRAVENOUS | Status: DC

## 2024-05-08 MED ORDER — FUROSEMIDE 10 MG/ML IJ SOLN
40.0000 mg | Freq: Once | INTRAMUSCULAR | Status: AC
  Administered 2024-05-08: 40 mg via INTRAVENOUS
  Filled 2024-05-08: qty 4

## 2024-05-08 MED ORDER — SODIUM CHLORIDE (PF) 0.9 % IJ SOLN
INTRAMUSCULAR | Status: AC
  Administered 2024-05-08: 10 mL
  Filled 2024-05-08: qty 10

## 2024-05-08 NOTE — Progress Notes (Addendum)
 PROGRESS NOTE    Kyle Kelley  FMW:[id number] DOB: 08-31-1945 DOA: 05/03/2024 PCP: Charlott Dorn LABOR, MD  No chief complaint on file.   Brief Narrative:   Kyle Kelley is Kyle Kelley 78 year old male with extensive history of atrial fibrillation (on Eliquis /recently held), vasculopath, with acute on chronic ischemic left lower extremity nonhealing wound, HTN, HLD, Aortic stenosis, CVA, prostate cancer s/p prostatectomy 2014 who presented on 12/5 to the vascular team for follow-up left lower extremity wound.    He presented 03/25/2024 for acute left limb ischemia, vascular was consulted and underwent left lower extremity thromboembolectomy via common femoral and below knee popliteal artery exposure incisions using 3 and 4 fogarty balloons, left tibioperoneal trunk endarterectomy with vein patch angioplasty.  Harvest L greater saphenous vein.  Stent L SFA.  In outpatient follow up, 04/29/2024 worsening wound dehiscence was noted.  He was brought back 05/03/2024 for debridement in OR and placement of wound VAC.  Patient Eliquis  has been held since 04/29/2024.   Labs on 12/5 revealed hemoglobin of 6.2.  Patient was admitted for evaluation of the anemia and was transfused.  Gastroenterology was consulted.  Patient underwent EGD on 12/6.   Patient had persistent abdominal pain after the procedure.  On 12/7, he developed lactic acidosis and leukocytosis.  Responded to fluids, lactic acid trended down.  Patient denied severe symptoms.  On 12/8, patient noted to have worsening leukocytosis.  CT C/Kyle Kelley/P ordered and revealed free air, likely due to perforated duodenal ulcer.    Surgery was consulted, he's now s/p ex lap/graham patch.   Assessment & Plan:   Principal Problem:   Symptomatic anemia Active Problems:   Dizziness   Atrial fibrillation (HCC)   ICH (intracerebral hemorrhage) (HCC)   Critical limb ischemia of left lower extremity (HCC)   Essential hypertension, benign   Aortic stenosis    Mixed hyperlipidemia   History of stroke without residual deficits  Perforated duodenal ulcer Duodenal ulcer was noted on EGD on 12/7. CT C/Kyle Kelley/P done on 12/9 revealed, among other findings, Kyle Kelley small volume of free air predominantly within the right upper quadrant that appeared to track to the proximal duodenum.  Also Kyle Kelley small volume of free fluid with layering hyperattenuation in the lower abdomen/pelvis which may represent hemoperitoneum. General surgery consulted - now s/p ex lap, graham patch repair of perforated duodenal ulcer Post op care per general surgery - abx, TNA, dressing changes - plan upper GI POD#5 to evaluate for leak   GI bleed Symptomatic anemia Acute on chronic anemia due to blood loss Iron Deficiency Gastroenterology was consulted and patient underwent EGD which revealed Kyle Kelley duodenal ulcer and gastritis - PPI BID x8 weeks - per GI ok to resume eliquis  48 hrs after EGD (12/8), but now on hold with above issue -Hb with mild downtrend today, will trend -s/p 2 units PRBC -Anemia workup shows low iron of 21 with saturation of 5.  Ferritin pending. - B12 907, folate 18. - Will continue to trend.  Volume Overload Bilateral Pleural Effusions Hypoxia No baseline oxygen use. CXR 12/9 with small bilateral effusions CT 12/8 with moderate bilateral effusions and diffuse body wall edema - also showed possible infection/aspiration Now hypoxic and currently requiring 2 to 3 L/min supplemental oxygen. Has RLE edema > L - follow RLE US  Echo 10/25 with preserved EF, mildly reduced RVSF - will diurese with IV lasix  and follow response (follow albumin  and BNP)  Critical limb ischemia of left lower extremity (HCC) - Vascular team following  closely  - Presented to ED on 03/25/24 for acute left limb ischemia, - 10/28 underwent Kyle Kelley left lower extremity thromboembolectomy via common femoral and below knee popliteal artery exposure incisions using 3 and 4 fogarty balloons, left tibioperoneal  trunk endarterectomy with vein patch angioplasty.  Harvest L greater saphenous vein.  Stent L SFA.   - 12/1  f/up for infection and hematoma, noted worsening wound dehiscence - 12/5 patient was brought back for debridement, washout, application of kerecis fish skin, and and placement of wound VAC. - Further post op management per vascular surgery - Vac change later this week - Patient Eliquis  has been held since 04/29/2024.  Vascular recommending restart eliquis  when ok with GI/general surgery.   Lactic acidosis, resolved Likely in the setting of perforated viscus and possible sepsis.   Leukocytosis Likely due to perforated viscus, peritonitis. Abx as above  Atrial fibrillation (HCC) Patient in RVR 12/7, 12/8. Patient on diltiazem  and metoprolol  at home (holding) Home eliquis  on hold  Given current n.p.o. status, IV metoprolol  scheduled with parameters.   Hyponatremia. Mild  Essential hypertension, benign - Currently blood pressure stable -On metoprolol  and diltiazem  at home.  PO meds currently on hold.  - Currently on IV scheduled metoprolol  as indicated above.   History of stroke without residual deficits - No new focal neurological findings - eliquis  and crestor  currently on hold   Nodular Hepatic Contour - labs with preserved synthetic function   Aortic stenosis - Last echo 03/18/2024-reviewed EF 55-60%, normal LV function, mildly enlarged right ventricle, mildly reduced RV function, aortic valve was not well visualized no significant stenosis was reported - Per cardiology, recommend repeat echocardiogram in the next 1 to 2 months as outpatient for high-quality imaging to reassess severity of aortic valve stenosis.  ICH (intracerebral hemorrhage) (HCC) Remote history of intracranial hemorrhage Noted  Mixed hyperlipidemia Crestor  on hold  Stage II sacral decubitus ulcer Present on admission. Continue wound care  Right internal jugular was placed yesterday - also  with R arm PICC - will follow and see if internal jugular can be discontinued?     DVT prophylaxis: SCD Code Status: full Family Communication: wife 12/10 at bedside Disposition:   Status is: Inpatient Remains inpatient appropriate because: need for continued inpatient care   Consultants:  Surgery GI Vascular  Procedures:  12/9 Procedure(s): LAPAROTOMY, EXPLORATORY GRAHAM PATCH REPAIR OF PERFORATED DUODENAL ULCER  12/6  EGD  12/5 Procedure Performed: 1.  Sharp excisional debridement left below-knee postsurgical wound including skin, subcutaneous tissue and fascia using 10 blade, cautery and medium curette to [place] total of 15 x 4 x 4 cm 2.  Washout left lower extremity postsurgical wound with 3 L of saline and 1 L of Vashe using pulse evacuation 3.  Application 38 cm Kerecis fish skin 4.  Application negative pressure dressing  Antimicrobials:  Anti-infectives (From admission, onward)    Start     Dose/Rate Route Frequency Ordered Stop   05/07/24 1930  micafungin  (MYCAMINE ) 100 mg in sodium chloride  0.9 % 100 mL IVPB        100 mg 105 mL/hr over 1 Hours Intravenous Every 24 hours 05/07/24 1832 05/12/24 1929   05/07/24 0000  piperacillin -tazobactam (ZOSYN ) IVPB 3.375 g        3.375 g 12.5 mL/hr over 240 Minutes Intravenous Every 8 hours 05/06/24 2241     05/03/24 0842  ceFAZolin  (ANCEF ) IVPB 2g/100 mL premix        2 g 200 mL/hr over 30  Minutes Intravenous 30 min pre-op 05/03/24 0842 05/03/24 1157       Subjective: C/o moderate abdominal pain post op  Wife at bedside, no other complaints  Objective: Vitals:   05/08/24 0200 05/08/24 0400 05/08/24 0630 05/08/24 0844  BP: 111/69 125/72  130/77  Pulse: 99 99  (!) 104  Resp: 15 17  20   Temp:  97.7 F (36.5 C)    TempSrc:  Axillary  Oral  SpO2: 96% 98%  91%  Weight:   82.6 kg   Height:        Intake/Output Summary (Last 24 hours) at 05/08/2024 0940 Last data filed at 05/08/2024 0800 Gross per 24 hour   Intake 2150.6 ml  Output 1480 ml  Net 670.6 ml   Filed Weights   05/04/24 0912 05/07/24 0500 05/08/24 0630  Weight: 77.6 kg (S) 88 kg 82.6 kg    Examination:  General: No acute distress. Cardiovascular: RRR Lungs: unlabored Abdomen: did not expose abdomen, he was sitting up in chair in gown, no significant TTP with gentle palpation while he was sitting up in recliner Neurological: Alert and oriented 3. Moves all extremities 4. Cranial nerves II through XII grossly intact. Extremities: RLE > LLE edema, wound vac to LLE  Data Reviewed: I have personally reviewed following labs and imaging studies  CBC: Recent Labs  Lab 05/05/24 2219 05/06/24 0419 05/06/24 1948 05/07/24 0437 05/08/24 0500  WBC 28.5* 24.9* 27.2* 29.8* 19.2*  HGB 12.1* 11.7* 11.1* 10.2* 9.1*  HCT 38.1* 36.3* 34.8* 31.6* 29.3*  MCV 89.2 89.2 90.2 88.8 91.8  PLT 242 254 232 225 173    Basic Metabolic Panel: Recent Labs  Lab 05/03/24 1804 05/05/24 0606 05/05/24 1051 05/06/24 0419 05/06/24 2122 05/07/24 0437 05/08/24 0500  NA  --    < > 133* 132* 128* 130* 133*  K  --    < > 5.0 5.1 5.0 4.7 4.4  CL  --    < > 102 100 99 100 100  CO2  --    < > 20* 24 23 16* 25  GLUCOSE  --    < > 115* 100* 146* 110* 149*  BUN  --    < > 29* 33* 34* 30* 22  CREATININE 0.71   < > 0.97 0.91 0.88 0.85 0.84  CALCIUM   --    < > 7.9* 7.9* 7.5* 7.8* 7.7*  MG 2.2  --   --   --  2.2 2.2 2.0  PHOS 3.8  --   --   --   --  3.2 2.8   < > = values in this interval not displayed.    GFR: Estimated Creatinine Clearance: 84.6 mL/min (by C-G formula based on SCr of 0.84 mg/dL).  Liver Function Tests: Recent Labs  Lab 05/05/24 0606 05/06/24 0419 05/07/24 0437  AST 29 21 23   ALT 21 19 20   ALKPHOS 49 47 49  BILITOT 0.6 0.6 0.8  PROT 5.3* 4.9* 5.2*  ALBUMIN  2.1* 1.8* 1.7*    CBG: Recent Labs  Lab 05/06/24 0823 05/06/24 1153 05/07/24 1836 05/07/24 2344 05/08/24 0616  GLUCAP 78 127* 120* 144* 145*     Recent  Results (from the past 240 hours)  Culture, blood (Routine X 2) w Reflex to ID Panel     Status: None (Preliminary result)   Collection Time: 05/06/24 11:47 PM   Specimen: BLOOD  Result Value Ref Range Status   Specimen Description BLOOD SITE NOT SPECIFIED  Final  Special Requests   Final    BOTTLES DRAWN AEROBIC AND ANAEROBIC Blood Culture adequate volume   Culture   Final    NO GROWTH 1 DAY Performed at [facility], [address] [place], [place] [address]    Report Status PENDING  Incomplete  Culture, blood (Routine X 2) w Reflex to ID Panel     Status: None (Preliminary result)   Collection Time: 05/06/24 11:47 PM   Specimen: BLOOD  Result Value Ref Range Status   Specimen Description BLOOD SITE NOT SPECIFIED  Final   Special Requests   Final    BOTTLES DRAWN AEROBIC AND ANAEROBIC Blood Culture adequate volume   Culture   Final    NO GROWTH 1 DAY Performed at [facility], [address], [place], [place] [address]    Report Status PENDING  Incomplete         Radiology Studies: DG CHEST PORT 1 VIEW Result Date: 05/07/2024 EXAM: 1 VIEW(S) XRAY OF THE CHEST 05/07/2024 09:43:00 PM COMPARISON: 05/07/2024 CLINICAL HISTORY: S/P PICC central line placement FINDINGS: LINES, TUBES AND DEVICES: Right chest wall PICC in place with tip overlying the expected region of the superior caval junction. Right IJ central venous catheter in place with tip overlying the expected region of the superior caval junction. Enteric tube in place coursing below the hemidiaphragm with tip and side port. LUNGS AND PLEURA: Chronic coarsened interstitial markings without overt pulmonary edema. Small bilateral pleural effusions. No pneumothorax. HEART AND MEDIASTINUM: Atherosclerotic plaque noted. No acute abnormality of the cardiac and mediastinal silhouettes. BONES AND SOFT TISSUES: Persistent gas under right hemidiaphragm likely related to recent peritoneal dialysis catheter placement. No acute  osseous abnormality. IMPRESSION: 1. Right chest wall PICC, right IJ central venous catheter, and enteric tube in appropriate positions. 2. Small bilateral pleural effusions. 3. Persistent gas under the right hemidiaphragm, likely related to recent peritoneal dialysis catheter placement. Electronically signed by: Morgane Naveau MD 05/07/2024 09:49 PM EST RP Workstation: [id number]   DG CHEST PORT 1 VIEW Result Date: 05/07/2024 CLINICAL DATA:  Central line placement EXAM: PORTABLE CHEST 1 VIEW COMPARISON:  Prior chest x-ray obtained earlier today at 1:30 Kateri Balch.m. FINDINGS: New right IJ approach central venous catheter. The catheter tip overlies the mid SVC. Partially imaged gastric tube in place. The tip lies off the field of view, presumably within the stomach. No pneumothorax. Small bilateral layering pleural effusions and associated bibasilar atelectasis. Pulmonary vascular congestion with mild pulmonary edema. IMPRESSION: 1. New right IJ central venous catheter with the tip overlying the mid SVC. 2. No evidence of pneumothorax or other complication. 3. Pulmonary edema and bilateral layering pleural effusions suggest CHF. Electronically Signed   By: Wilkie Lent M.D.   On: 05/07/2024 16:17   US  EKG SITE RITE Result Date: 05/07/2024 If Site Rite image not attached, placement could not be confirmed due to current cardiac rhythm.  DG Abd 1 View Result Date: 05/07/2024 CLINICAL DATA:  NG tube placement. EXAM: ABDOMEN - 1 VIEW COMPARISON:  Earlier same day FINDINGS: NG tube tip is in the mid stomach with proximal side port below the GE junction. Bibasilar atelectasis/infiltrate with small effusions again noted. Gaseous distention of bowel in the upper abdomen is similar to prior. IMPRESSION: NG tube tip is in the mid stomach. Electronically Signed   By: Camellia Candle M.D.   On: 05/07/2024 05:48   DG Chest Port 1 View Result Date: 05/07/2024 EXAM: 1 VIEW(S) XRAY OF THE CHEST 05/07/2024  97:71:74 AM  COMPARISON: CXR 10/24/2012, CT Chest 05/06/2024, XR Abdomen 05/07/2024. CLINICAL HISTORY: [id number] Encounter for nasogastric (NG) tube placement [id number] Encounter for nasogastric (NG) tube placement FINDINGS: LINES, TUBES AND DEVICES: Enteric tube overlies the superior and mid mediastinum with the tip not visualized, possibly at the level of the aortic arch. LUNGS AND PLEURA: Persistent bilateral small pleural effusions. Associated basilar airspace opacities. No pneumothorax. HEART AND MEDIASTINUM: Atherosclerotic plaque. BONES AND SOFT TISSUES: No acute osseous abnormality. IMPRESSION: 1. Enteric tube overlies the superior and mid mediastinum with the tip not visualized, potentially malpositioned at the level of the aortic arch. 2. Persistent bilateral small pleural effusions with associated basilar airspace opacities. Electronically signed by: Morgane Naveau MD 05/07/2024 02:39 AM EST RP Workstation: [id number]   DG Abd 1 View Result Date: 05/07/2024 EXAM: 1 VIEW XRAY OF THE ABDOMEN 05/07/2024 01:45:00 AM COMPARISON: None available. CLINICAL HISTORY: [id number] Encounter for nasogastric (NG) tube placement [id number] Encounter for nasogastric (NG) tube placement FINDINGS: LINES, TUBES AND DEVICES: Enteric tube is not visualized. Consider further evaluation with chest x-ray, as it may be coiled more proximally. BOWEL: Nonobstructive bowel gas pattern. Lower abdomen/pelvis collimated off view. SOFT TISSUES: No abnormal calcifications. Nonspecific blunting of bilateral costophrenic angles. BONES: No acute fracture. IMPRESSION: 1. Enteric tube not visualized. Further evaluation with chest radiograph is recommended, as it may be coiled more proximally. Electronically signed by: Morgane Naveau MD 05/07/2024 01:50 AM EST RP Workstation: [id number]   DG Abd 1 View Result Date: 05/07/2024 EXAM: 1 VIEW XRAY OF THE ABDOMEN 05/07/2024 12:52:00 AM COMPARISON: CT 05/06/2024. CLINICAL HISTORY: Encounter for imaging study to confirm  nasogastric (NG) tube placement. FINDINGS: LINES, TUBES AND DEVICES: NG tube tip in the distal esophagus. BOWEL: Nonobstructive bowel gas pattern. Free air noted in the upper abdomen. SOFT TISSUES: No abnormal calcifications. BONES: No acute fracture. LUNGS: Bibasilar airspace opacities and small effusions. IMPRESSION: 1. NG tube tip in the distal esophagus. Recommend advancing several cm 2. Free air in the upper abdomen. 3. Bibasilar airspace opacities and small effusions. Electronically signed by: Franky Crease MD 05/07/2024 12:59 AM EST RP Workstation: [id number]   CT CHEST ABDOMEN PELVIS WO CONTRAST Result Date: 05/06/2024 CLINICAL DATA:  , Hypoxia, and abdominal pain EXAM: CT CHEST, ABDOMEN AND PELVIS WITHOUT CONTRAST TECHNIQUE: Multidetector CT imaging of the chest, abdomen and pelvis was performed following the standard protocol without IV contrast. RADIATION DOSE REDUCTION: This exam was performed according to the departmental dose-optimization program which includes automated exposure control, adjustment of the mA and/or kV according to patient size and/or use of iterative reconstruction technique. COMPARISON:  CT abdomen and pelvis dated 12/24/2020 FINDINGS: CT CHEST FINDINGS Cardiovascular: Normal heart size. No significant pericardial fluid/thickening. Great vessels are normal in course and caliber. Coronary artery calcifications and aortic atherosclerosis. Mediastinum/Nodes: Imaged thyroid  gland without nodules meeting criteria for imaging follow-up by size. Small hiatal hernia. No pathologically enlarged axillary, supraclavicular, mediastinal, or hilar lymph nodes. Lungs/Pleura: The central airways are patent. Trace secretions within the trachea. Mild diffuse bronchial wall thickening. Moderate centrilobular and paraseptal emphysema. Small foci of irregular consolidation within the posterior right upper lobe and basilar right middle lobe. Bilateral lower lobe subsegmental atelectasis. No pneumothorax.  Moderate bilateral pleural effusions. Musculoskeletal: No acute or abnormal lytic or blastic osseous lesions. Flowing anterior osteophytes over at least 4 contiguous thoracic vertebrae which can be seen in the setting of diffuse idiopathic skeletal hyperostosis (DISH). CT ABDOMEN PELVIS FINDINGS Hepatobiliary: Mildly nodular hepatic contour. No focal hepatic lesions. No intra  or extrahepatic biliary ductal dilation. Normal gallbladder. Pancreas: No focal lesions or main ductal dilation. Spleen: Normal in size without focal abnormality. Adrenals/Urinary Tract: No adrenal nodules. No suspicious renal mass, calculi, or hydronephrosis. No focal bladder wall thickening. Stomach/Bowel: Normal appearance of the stomach. The wall of the 2/3 portion of the duodenum appears thickened/indistinct (3:81). Presumed cecum is displaced superiorly and anteriorly. Sherie Dobrowolski dilated loop of small bowel in the right upper quadrant (3:86) is inseparable from the thickened duodenum. Colonic diverticulosis without acute diverticulitis. Appendix is not discretely seen. Vascular/Lymphatic: Aortic atherosclerosis. No enlarged abdominal or pelvic lymph nodes. Reproductive: Prostatectomy.  Large bilateral hydroceles. Other: Small volume free air, predominantly within the right upper quadrant. Kennen Stammer focus of free air appears to track to the proximal duodenum (3:70). Small volume free fluid with layering hyperattenuation in the lower abdomen/pelvis. Musculoskeletal: No acute or abnormal lytic or blastic osseous findings. Small bilateral inguinal hernias containing fat on the left and fat and free fluid on the right. Diffuse body wall edema. IMPRESSION: 1. Small volume pneumoperitoneum, predominantly within the right upper quadrant, with Madsen Riddle focus of free air appearing to track to the proximal duodenum, suspicious for perforated duodenal ulcer. 2. Small volume free fluid with layering hyperattenuation in the lower abdomen/pelvis, which may represent  hemoperitoneum. 3. Small foci of irregular consolidation within the posterior right upper lobe and basilar right middle lobe, which may represent infection or aspiration. 4. Moderate bilateral pleural effusions, diffuse body wall edema, and large bilateral hydroceles. 5. Mildly nodular hepatic contour, which can be seen in the setting of cirrhosis. Recommend correlation with liver function tests. 6. Aortic Atherosclerosis (ICD10-I70.0) and Emphysema (ICD10-J43.9). Coronary artery calcifications. Assessment for potential risk factor modification, dietary therapy or pharmacologic therapy may be warranted, if clinically indicated. Critical Value/emergent results were called by telephone at the time of interpretation on 05/06/2024 at 5:45 pm to provider [facility] MDALA-GAUSI , who verbally acknowledged these results. Electronically Signed   By: Limin  Xu M.D.   On: 05/06/2024 17:56        Scheduled Meds:  Chlorhexidine  Gluconate Cloth  6 each Topical Daily   insulin  aspart  0-9 Units Subcutaneous Q6H   liver oil-zinc  oxide   Topical BID   methocarbamol  (ROBAXIN ) injection  500 mg Intravenous Q8H   metoprolol  tartrate  5 mg Intravenous Q6H   pantoprazole  (PROTONIX ) IV  40 mg Intravenous Q12H   sodium chloride  flush  10-40 mL Intracatheter Q12H   sodium chloride  flush  3 mL Intravenous Q12H   sodium chloride  flush  3 mL Intravenous Q12H   Continuous Infusions:  sodium chloride  40 mL/hr at 05/08/24 0448   acetaminophen  Stopped (05/08/24 0443)   micafungin  (MYCAMINE ) 100 mg in sodium chloride  0.9 % 100 mL IVPB Stopped (05/07/24 2257)   piperacillin -tazobactam (ZOSYN )  IV 3.375 g (05/08/24 0929)   TPN ADULT (ION) 40 mL/hr at 05/08/24 0448     LOS: 5 days    Time spent: over 30 min     Meliton Monte, MD [facility]   To contact the attending provider between 7A-7P or the covering provider during after hours 7P-7A, please log into the web site [email or web address] and access using universal  [place] password for that web site. If you do not have the password, please call the hospital operator.  05/08/2024, 9:40 AM

## 2024-05-08 NOTE — Progress Notes (Addendum)
 PHARMACY - TOTAL PARENTERAL NUTRITION CONSULT NOTE   Indication: perforated viscus   Patient Measurements: Height: 6' 1 (185.4 cm) Weight: 82.6 kg (182 lb 1.6 oz) IBW/kg (Calculated) : 79.9 TPN AdjBW (KG): 77.6 Body mass index is 24.03 kg/m. Usual Weight: ~78 kg (previous weights between 159 - 171 lbs over past 6 months)  Assessment:  78 y.o. male with history of atrial fibrillation and history of prostate cancer s/p prostatectomy and radiation therapy who underwent debridement of left below knee postsurgical wound 12/06 and was found to have severe anemia with a hemoglobin of 6.2 post-op. Patient states that for the past 3 months he has noticed that stools have been unusually dark, black, usually 1 to 2/day.   GI was consulted 12/06 due to persistent abdominal pain post-procedure prompting CT scan. EGD was notable for non-bleeding ulcer in the duodenal bulb. CT with small volume pneumoperitoneum tracking near the duodenum. Patient has been started on BID PPI and empiric Zosyn . Surgery recommends bowel rest with NGTS, TPN for nutrtion while NPO. Pharmacy consulted to initiate TPN.  Glucose / Insulin : CBGs 120-140s on TPN (4 units SSI/24 hrs)  - 02/2024 A1c 5.7%; CBGs controlled, no insulin  PTA Electrolytes: K 3.6, Mg 1.9, Phos 2.1, Na 135, CoCa 9.3 Renal: Scr 0.48, BUN 18 Hepatic: LFTs wnl, Tbili 0.6, Alb 1.6, bsl TG 02/2024 32 Intake / Output; MIVF:  UOP 1.7 mL/kg/hr, LBM 12/4, drains 230 mL  GI Imaging: 12/08 CT: small volume pneumoperitoneum suspicious for perforated duodenal ulcer, small volume free fluid with layering hyperattenuation in the lower abdomen/pelvis, which may represent hemoperitoneum GI Surgeries / Procedures:   Central access: (05/07/24) TPN start date: 05/07/24  Nutritional Goals: Goal TPN rate is 80 mL/hr (provides 117 g of protein and ~1960 kcals per day)  RD Assessment:  Estimated Needs Total Energy Estimated Needs: 2100-2300 kcals Total Protein  Estimated Needs: 100-115g Total Fluid Estimated Needs: >2L/day  Current Nutrition:  NPO 12/09 >> NGTS 12/09 >>  Plan:  Continue TPN to 80 mL/hr at 1800 to provide 100% daily nutritional needs Electrolytes in TPN: Na 50 mEq/L, K 35 mEq/L, Ca 5 mEq/L, Mg 5 mEq/L, and Phos 15 mmol/L. Cl:Ac max Cl Kphos 30 mmol IV x1 outside of TPN Add standard MVI and trace elements to TPN Continue Sensitive q6h SSI and adjust as needed, DC tomorrow if low use Reduce MIVF to 10 mL/hr at 1800 Monitor TPN labs on Mon/Thurs, daily PRN  Thank you for allowing pharmacy to be a part of this patients care.  Shelba Collier, PharmD, BCPS Clinical Pharmacist

## 2024-05-08 NOTE — Plan of Care (Signed)

## 2024-05-08 NOTE — Progress Notes (Signed)
 Right lower extremity venous duplex has been completed. Preliminary results can be found in CV Proc through chart review.   05/08/24 12:01 PM Cathlyn Collet RVT

## 2024-05-08 NOTE — Care Management Important Message (Signed)
 Important Message  Patient Details  Name: Kyle Kelley MRN: [id number] Date of Birth: August 27, 1945   Important Message Given:  Yes - Medicare IM     Claretta Deed 05/08/2024, 2:29 PM

## 2024-05-08 NOTE — Plan of Care (Signed)

## 2024-05-08 NOTE — Progress Notes (Signed)
 Physical Therapy Treatment Patient Details Name: Kyle Kelley MRN: [id number] DOB: Feb 29, 1946 Today's Date: 05/08/2024   History of Present Illness Kyle Kelley is a 78 y.o. male admitted 05/03/24 with symptomatic anemia. He has been followed closely by vascular team outpatient and at visit 12/1 noted worsening wound dehiscence brought in 12/5 for debridement in OR and placement of wound VAC. Pt underwent EGD 12/6, which showed  gastritis, duodenal ulcers clean base nonbleeding, random gastric biopsies r/o h-pylori. PMHx: atrial fibrillation (on Eliquis /recently held), vasculopath, acute on chronic ischemic LLE nonhealing wound, HTN, HLD, aortic stenosis, CVA, prostate cancer s/p prostatectomy 2014, and PVD.    PT Comments  Pt resting in bed on arrival, agreeable to session with slow progress towards acute goals this session as pt with increased abdominal pain, requiring increased assist for all mobility this date. Pt requiring cues for log roll technique for increased abdominal comfort during bed mobility and mod A to complete. Pt needing mod A to complete transfers sit<>stand and min A for short in room gait with RW for support with distance limited by fatigue and impaired balance. Pt agreeable to time up in chair at end of session and verbalizing understanding of education on importance of frequent mobilization to maximize functional mobility gains. Pt continues to benefit from skilled PT services to progress toward functional mobility goals.     If plan is discharge home, recommend the following: A little help with walking and/or transfers;A little help with bathing/dressing/bathroom;Assistance with cooking/housework;Assist for transportation;Help with stairs or ramp for entrance   Can travel by private vehicle        Equipment Recommendations  None recommended by PT    Recommendations for Other Services       Precautions / Restrictions Precautions Precautions: Fall;Other (comment) (NG  tube) Recall of Precautions/Restrictions: Intact Precaution/Restrictions Comments: wound vac LLE Restrictions Weight Bearing Restrictions Per Provider Order: No     Mobility  Bed Mobility Overal bed mobility: Needs Assistance Bed Mobility: Rolling, Sidelying to Sit Rolling: Min assist Sidelying to sit: Mod assist       General bed mobility comments: education and cues for log roll technique for abdominal comfort, mod A to elevate trunk from sidelying due to pain    Transfers Overall transfer level: Needs assistance Equipment used: Rolling walker (2 wheels) Transfers: Sit to/from Stand Sit to Stand: Mod assist           General transfer comment: cues for hand placement, mod A to rise to stand, kness flexed throughout    Ambulation/Gait Ambulation/Gait assistance: Min assist Gait Distance (Feet): 18 Feet Assistive device: Rolling walker (2 wheels) Gait Pattern/deviations: Step-through pattern, Decreased stride length, Trunk flexed Gait velocity: decreased     General Gait Details: min A to maintain balance with noted knee flexion throughout. low clearance impriving with distance   Stairs             Wheelchair Mobility     Tilt Bed    Modified Rankin (Stroke Patients Only)       Balance Overall balance assessment: Needs assistance Sitting-balance support: Bilateral upper extremity supported, Feet supported Sitting balance-Leahy Scale: Fair Sitting balance - Comments: EOB   Standing balance support: Bilateral upper extremity supported, During functional activity, Reliant on assistive device for balance, Single extremity supported Standing balance-Leahy Scale: Poor Standing balance comment: reliant on UE support  Communication Communication Communication: Impaired Factors Affecting Communication: Hearing impaired  Cognition Arousal: Alert Behavior During Therapy: WFL for tasks assessed/performed   PT -  Cognitive impairments: Problem solving, Safety/Judgement                       PT - Cognition Comments: Pt A,Ox4. He appears to have delayed processing. Required moderate cues to improve safety awareness during mobility. Following commands: Impaired Following commands impaired: Follows one step commands with increased time    Cueing Cueing Techniques: Verbal cues, Gestural cues  Exercises General Exercises - Lower Extremity Ankle Circles/Pumps: AROM, Both, 10 reps Hip Flexion/Marching: AROM, Both, 20 reps, Seated    General Comments General comments (skin integrity, edema, etc.): wife present and supportive, pt recieved on 2L 95% at rest, SpO2 down to 84% on RA at rest, reapplied 2L, dropping to 86% on 2L with mobility needing 3L to maintain >90% with mobility, titrated to 2L at end of session with SpO2 90% at rest, BP 108/68 prior to session, 130/77(90) at end of session up in chair      Pertinent Vitals/Pain Pain Assessment Pain Assessment: Faces Faces Pain Scale: Hurts little more Pain Location: abd Pain Descriptors / Indicators: Sore Pain Intervention(s): Monitored during session, Limited activity within patient's tolerance, Repositioned    Home Living                          Prior Function            PT Goals (current goals can now be found in the care plan section) Acute Rehab PT Goals Patient Stated Goal: Return Home PT Goal Formulation: With patient/family Time For Goal Achievement: 05/19/24 Progress towards PT goals: Not progressing toward goals - comment (pain and weakness post sx)    Frequency    Min 2X/week      PT Plan      Co-evaluation              AM-PAC PT 6 Clicks Mobility   Outcome Measure  Help needed turning from your back to your side while in a flat bed without using bedrails?: A Little Help needed moving from lying on your back to sitting on the side of a flat bed without using bedrails?: A Little Help needed  moving to and from a bed to a chair (including a wheelchair)?: A Little Help needed standing up from a chair using your arms (e.g., wheelchair or bedside chair)?: A Lot Help needed to walk in hospital room?: A Lot Help needed climbing 3-5 steps with a railing? : A Lot 6 Click Score: 15    End of Session Equipment Utilized During Treatment: Gait belt Activity Tolerance: Patient tolerated treatment well Patient left: with call bell/phone within reach;with family/visitor present;in chair Nurse Communication: Mobility status PT Visit Diagnosis: Difficulty in walking, not elsewhere classified (R26.2);Other abnormalities of gait and mobility (R26.89)     Time: 9187-9153 PT Time Calculation (min) (ACUTE ONLY): 34 min  Charges:    $Gait Training: 8-22 mins $Therapeutic Activity: 8-22 mins PT General Charges $$ ACUTE PT VISIT: 1 Visit                     Jamus Loving R. PTA Acute Rehabilitation Services Office: [phone]   Therisa CHRISTELLA Boor 05/08/2024, 8:57 AM

## 2024-05-08 NOTE — Progress Notes (Signed)
 1 Day Post-Op   Subjective/Chief Complaint: Comfortable this morning   Objective: Vital signs in last 24 hours: Temp:  [97.2 F (36.2 C)-98.2 F (36.8 C)] 97.7 F (36.5 C) (12/10 0400) Pulse Rate:  [93-125] 99 (12/10 0400) Resp:  [15-22] 17 (12/10 0400) BP: (96-134)/(63-86) 125/72 (12/10 0400) SpO2:  [94 %-100 %] 98 % (12/10 0400) Weight:  [82.6 kg] 82.6 kg (12/10 0630) Last BM Date : 05/02/24  Intake/Output from previous day: 12/09 0701 - 12/10 0700 In: 2150.6 [I.V.:1185.7; NG/GT:60; IV Piggyback:904.9] Out: 1390 [Urine:925; Drains:415; Blood:50] Intake/Output this shift: No intake/output data recorded.  Exam: Awake and alert, follows commands Looks comfortable Abdomen soft, drain serosang, dressing dry  Lab Results:  Recent Labs    05/07/24 0437 05/08/24 0500  WBC 29.8* 19.2*  HGB 10.2* 9.1*  HCT 31.6* 29.3*  PLT 225 173   BMET Recent Labs    05/07/24 0437 05/08/24 0500  NA 130* 133*  K 4.7 4.4  CL 100 100  CO2 16* 25  GLUCOSE 110* 149*  BUN 30* 22  CREATININE 0.85 0.84  CALCIUM  7.8* 7.7*   PT/INR No results for input(s): LABPROT, INR in the last 72 hours. ABG No results for input(s): PHART, HCO3 in the last 72 hours.  Invalid input(s): PCO2, PO2  Studies/Results: DG CHEST PORT 1 VIEW Result Date: 05/07/2024 EXAM: 1 VIEW(S) XRAY OF THE CHEST 05/07/2024 09:43:00 PM COMPARISON: 05/07/2024 CLINICAL HISTORY: S/P PICC central line placement FINDINGS: LINES, TUBES AND DEVICES: Right chest wall PICC in place with tip overlying the expected region of the superior caval junction. Right IJ central venous catheter in place with tip overlying the expected region of the superior caval junction. Enteric tube in place coursing below the hemidiaphragm with tip and side port. LUNGS AND PLEURA: Chronic coarsened interstitial markings without overt pulmonary edema. Small bilateral pleural effusions. No pneumothorax. HEART AND MEDIASTINUM: Atherosclerotic  plaque noted. No acute abnormality of the cardiac and mediastinal silhouettes. BONES AND SOFT TISSUES: Persistent gas under right hemidiaphragm likely related to recent peritoneal dialysis catheter placement. No acute osseous abnormality. IMPRESSION: 1. Right chest wall PICC, right IJ central venous catheter, and enteric tube in appropriate positions. 2. Small bilateral pleural effusions. 3. Persistent gas under the right hemidiaphragm, likely related to recent peritoneal dialysis catheter placement. Electronically signed by: Morgane Naveau MD 05/07/2024 09:49 PM EST RP Workstation: [id number]   DG CHEST PORT 1 VIEW Result Date: 05/07/2024 CLINICAL DATA:  Central line placement EXAM: PORTABLE CHEST 1 VIEW COMPARISON:  Prior chest x-ray obtained earlier today at 1:30 a.m. FINDINGS: New right IJ approach central venous catheter. The catheter tip overlies the mid SVC. Partially imaged gastric tube in place. The tip lies off the field of view, presumably within the stomach. No pneumothorax. Small bilateral layering pleural effusions and associated bibasilar atelectasis. Pulmonary vascular congestion with mild pulmonary edema. IMPRESSION: 1. New right IJ central venous catheter with the tip overlying the mid SVC. 2. No evidence of pneumothorax or other complication. 3. Pulmonary edema and bilateral layering pleural effusions suggest CHF. Electronically Signed   By: Wilkie Lent M.D.   On: 05/07/2024 16:17   US  EKG SITE RITE Result Date: 05/07/2024 If Site Rite image not attached, placement could not be confirmed due to current cardiac rhythm.  DG Abd 1 View Result Date: 05/07/2024 CLINICAL DATA:  NG tube placement. EXAM: ABDOMEN - 1 VIEW COMPARISON:  Earlier same day FINDINGS: NG tube tip is in the mid stomach with proximal side port below  the GE junction. Bibasilar atelectasis/infiltrate with small effusions again noted. Gaseous distention of bowel in the upper abdomen is similar to prior. IMPRESSION: NG  tube tip is in the mid stomach. Electronically Signed   By: Camellia Candle M.D.   On: 05/07/2024 05:48   DG Chest Port 1 View Result Date: 05/07/2024 EXAM: 1 VIEW(S) XRAY OF THE CHEST 05/07/2024 02:28:25 AM COMPARISON: CXR 10/24/2012, CT Chest 05/06/2024, XR Abdomen 05/07/2024. CLINICAL HISTORY: [id number] Encounter for nasogastric (NG) tube placement [id number] Encounter for nasogastric (NG) tube placement FINDINGS: LINES, TUBES AND DEVICES: Enteric tube overlies the superior and mid mediastinum with the tip not visualized, possibly at the level of the aortic arch. LUNGS AND PLEURA: Persistent bilateral small pleural effusions. Associated basilar airspace opacities. No pneumothorax. HEART AND MEDIASTINUM: Atherosclerotic plaque. BONES AND SOFT TISSUES: No acute osseous abnormality. IMPRESSION: 1. Enteric tube overlies the superior and mid mediastinum with the tip not visualized, potentially malpositioned at the level of the aortic arch. 2. Persistent bilateral small pleural effusions with associated basilar airspace opacities. Electronically signed by: Morgane Naveau MD 05/07/2024 02:39 AM EST RP Workstation: [id number]   DG Abd 1 View Result Date: 05/07/2024 EXAM: 1 VIEW XRAY OF THE ABDOMEN 05/07/2024 01:45:00 AM COMPARISON: None available. CLINICAL HISTORY: [id number] Encounter for nasogastric (NG) tube placement [id number] Encounter for nasogastric (NG) tube placement FINDINGS: LINES, TUBES AND DEVICES: Enteric tube is not visualized. Consider further evaluation with chest x-ray, as it may be coiled more proximally. BOWEL: Nonobstructive bowel gas pattern. Lower abdomen/pelvis collimated off view. SOFT TISSUES: No abnormal calcifications. Nonspecific blunting of bilateral costophrenic angles. BONES: No acute fracture. IMPRESSION: 1. Enteric tube not visualized. Further evaluation with chest radiograph is recommended, as it may be coiled more proximally. Electronically signed by: Morgane Naveau MD 05/07/2024 01:50 AM EST RP  Workstation: [id number]   DG Abd 1 View Result Date: 05/07/2024 EXAM: 1 VIEW XRAY OF THE ABDOMEN 05/07/2024 12:52:00 AM COMPARISON: CT 05/06/2024. CLINICAL HISTORY: Encounter for imaging study to confirm nasogastric (NG) tube placement. FINDINGS: LINES, TUBES AND DEVICES: NG tube tip in the distal esophagus. BOWEL: Nonobstructive bowel gas pattern. Free air noted in the upper abdomen. SOFT TISSUES: No abnormal calcifications. BONES: No acute fracture. LUNGS: Bibasilar airspace opacities and small effusions. IMPRESSION: 1. NG tube tip in the distal esophagus. Recommend advancing several cm 2. Free air in the upper abdomen. 3. Bibasilar airspace opacities and small effusions. Electronically signed by: Franky Crease MD 05/07/2024 12:59 AM EST RP Workstation: [id number]   CT CHEST ABDOMEN PELVIS WO CONTRAST Result Date: 05/06/2024 CLINICAL DATA:  , Hypoxia, and abdominal pain EXAM: CT CHEST, ABDOMEN AND PELVIS WITHOUT CONTRAST TECHNIQUE: Multidetector CT imaging of the chest, abdomen and pelvis was performed following the standard protocol without IV contrast. RADIATION DOSE REDUCTION: This exam was performed according to the departmental dose-optimization program which includes automated exposure control, adjustment of the mA and/or kV according to patient size and/or use of iterative reconstruction technique. COMPARISON:  CT abdomen and pelvis dated 12/24/2020 FINDINGS: CT CHEST FINDINGS Cardiovascular: Normal heart size. No significant pericardial fluid/thickening. Great vessels are normal in course and caliber. Coronary artery calcifications and aortic atherosclerosis. Mediastinum/Nodes: Imaged thyroid  gland without nodules meeting criteria for imaging follow-up by size. Small hiatal hernia. No pathologically enlarged axillary, supraclavicular, mediastinal, or hilar lymph nodes. Lungs/Pleura: The central airways are patent. Trace secretions within the trachea. Mild diffuse bronchial wall thickening. Moderate  centrilobular and paraseptal emphysema. Small foci of irregular consolidation within the posterior right upper lobe and  basilar right middle lobe. Bilateral lower lobe subsegmental atelectasis. No pneumothorax. Moderate bilateral pleural effusions. Musculoskeletal: No acute or abnormal lytic or blastic osseous lesions. Flowing anterior osteophytes over at least 4 contiguous thoracic vertebrae which can be seen in the setting of diffuse idiopathic skeletal hyperostosis (DISH). CT ABDOMEN PELVIS FINDINGS Hepatobiliary: Mildly nodular hepatic contour. No focal hepatic lesions. No intra or extrahepatic biliary ductal dilation. Normal gallbladder. Pancreas: No focal lesions or main ductal dilation. Spleen: Normal in size without focal abnormality. Adrenals/Urinary Tract: No adrenal nodules. No suspicious renal mass, calculi, or hydronephrosis. No focal bladder wall thickening. Stomach/Bowel: Normal appearance of the stomach. The wall of the 2/3 portion of the duodenum appears thickened/indistinct (3:81). Presumed cecum is displaced superiorly and anteriorly. A dilated loop of small bowel in the right upper quadrant (3:86) is inseparable from the thickened duodenum. Colonic diverticulosis without acute diverticulitis. Appendix is not discretely seen. Vascular/Lymphatic: Aortic atherosclerosis. No enlarged abdominal or pelvic lymph nodes. Reproductive: Prostatectomy.  Large bilateral hydroceles. Other: Small volume free air, predominantly within the right upper quadrant. A focus of free air appears to track to the proximal duodenum (3:70). Small volume free fluid with layering hyperattenuation in the lower abdomen/pelvis. Musculoskeletal: No acute or abnormal lytic or blastic osseous findings. Small bilateral inguinal hernias containing fat on the left and fat and free fluid on the right. Diffuse body wall edema. IMPRESSION: 1. Small volume pneumoperitoneum, predominantly within the right upper quadrant, with a focus of  free air appearing to track to the proximal duodenum, suspicious for perforated duodenal ulcer. 2. Small volume free fluid with layering hyperattenuation in the lower abdomen/pelvis, which may represent hemoperitoneum. 3. Small foci of irregular consolidation within the posterior right upper lobe and basilar right middle lobe, which may represent infection or aspiration. 4. Moderate bilateral pleural effusions, diffuse body wall edema, and large bilateral hydroceles. 5. Mildly nodular hepatic contour, which can be seen in the setting of cirrhosis. Recommend correlation with liver function tests. 6. Aortic Atherosclerosis (ICD10-I70.0) and Emphysema (ICD10-J43.9). Coronary artery calcifications. Assessment for potential risk factor modification, dietary therapy or pharmacologic therapy may be warranted, if clinically indicated. Critical Value/emergent results were called by telephone at the time of interpretation on 05/06/2024 at 5:45 pm to provider [facility] [email or web address] , who verbally acknowledged these results. Electronically Signed   By: Limin  Xu M.D.   On: 05/06/2024 17:56    Anti-infectives: Anti-infectives (From admission, onward)    Start     Dose/Rate Route Frequency Ordered Stop   05/07/24 1930  micafungin  (MYCAMINE ) 100 mg in sodium chloride  0.9 % 100 mL IVPB        100 mg 105 mL/hr over 1 Hours Intravenous Every 24 hours 05/07/24 1832 05/12/24 1929   05/07/24 0000  piperacillin -tazobactam (ZOSYN ) IVPB 3.375 g        3.375 g 12.5 mL/hr over 240 Minutes Intravenous Every 8 hours 05/06/24 2241     05/03/24 0842  ceFAZolin  (ANCEF ) IVPB 2g/100 mL premix        2 g 200 mL/hr over 30 Minutes Intravenous 30 min pre-op 05/03/24 0842 05/03/24 1157       Assessment/Plan: POD 1 s/p perforated duodenal ulcer with abscess 12/09 by Dr. Vernetta -s/p EGD 12/6 for UGIB with findings of gastritis and duodenal ulcer, pneumoperitoneum on CT 12/8 -LLE limb ischemia s/p thrombectomy and subsequent I&D  infected hematoma/VAC placement; VVS would like to start plavix  once hgb stable Afib - anticoag was held due to GIB.  Aortic stenosis HTN  HLD  -continue NG.  Plan upper GI POD#5 to evaluate for leak -TNA -IV antibiotics. WBC 19 down from 29.8 -wet to dry dressing changes to midline wound  Vicenta Poli 05/08/2024

## 2024-05-09 DIAGNOSIS — I613 Nontraumatic intracerebral hemorrhage in brain stem: Secondary | ICD-10-CM

## 2024-05-09 DIAGNOSIS — E782 Mixed hyperlipidemia: Secondary | ICD-10-CM | POA: Diagnosis not present

## 2024-05-09 DIAGNOSIS — Z8673 Personal history of transient ischemic attack (TIA), and cerebral infarction without residual deficits: Secondary | ICD-10-CM

## 2024-05-09 DIAGNOSIS — T8149XA Infection following a procedure, other surgical site, initial encounter: Secondary | ICD-10-CM

## 2024-05-09 DIAGNOSIS — I10 Essential (primary) hypertension: Secondary | ICD-10-CM

## 2024-05-09 LAB — COMPREHENSIVE METABOLIC PANEL WITH GFR
ALT: 22 U/L (ref 0–44)
AST: 29 U/L (ref 15–41)
Albumin: 1.6 g/dL — ABNORMAL LOW (ref 3.5–5.0)
Alkaline Phosphatase: 46 U/L (ref 38–126)
Anion gap: 3 — ABNORMAL LOW (ref 5–15)
BUN: 18 mg/dL (ref 8–23)
CO2: 34 mmol/L — ABNORMAL HIGH (ref 22–32)
Calcium: 7.4 mg/dL — ABNORMAL LOW (ref 8.9–10.3)
Chloride: 98 mmol/L (ref 98–111)
Creatinine, Ser: 0.48 mg/dL — ABNORMAL LOW (ref 0.61–1.24)
GFR, Estimated: >60 mL/min (ref >60)
Glucose, Bld: 126 mg/dL — ABNORMAL HIGH (ref 70–99)
Potassium: 3.6 mmol/L (ref 3.5–5.1)
Sodium: 135 mmol/L (ref 135–145)
Total Bilirubin: 0.6 mg/dL (ref 0.0–1.2)
Total Protein: 4.7 g/dL — ABNORMAL LOW (ref 6.5–8.1)

## 2024-05-09 LAB — CBC
HCT: 27.7 % — ABNORMAL LOW (ref 39.0–52.0)
Hemoglobin: 8.5 g/dL — ABNORMAL LOW (ref 13.0–17.0)
MCH: 28.1 pg (ref 26.0–34.0)
MCHC: 30.7 g/dL (ref 30.0–36.0)
MCV: 91.4 fL (ref 80.0–100.0)
Platelets: 162 K/uL (ref 150–400)
RBC: 3.03 MIL/uL — ABNORMAL LOW (ref 4.22–5.81)
RDW: 17.1 % — ABNORMAL HIGH (ref 11.5–15.5)
WBC: 20.5 K/uL — ABNORMAL HIGH (ref 4.0–10.5)
nRBC: 0.2 % (ref 0.0–0.2)

## 2024-05-09 LAB — GLUCOSE, CAPILLARY
Glucose-Capillary: 136 mg/dL — ABNORMAL HIGH (ref 70–99)
Glucose-Capillary: 156 mg/dL — ABNORMAL HIGH (ref 70–99)
Glucose-Capillary: 133 mg/dL — ABNORMAL HIGH (ref 70–99)

## 2024-05-09 LAB — PHOSPHORUS: Phosphorus: 2.1 mg/dL — ABNORMAL LOW (ref 2.5–4.6)

## 2024-05-09 LAB — MAGNESIUM: Magnesium: 1.9 mg/dL (ref 1.7–2.4)

## 2024-05-09 MED ORDER — TRACE MINERALS CU-MN-SE-ZN 300-55-60-3000 MCG/ML IV SOLN
INTRAVENOUS | Status: AC
  Filled 2024-05-09: qty 768

## 2024-05-09 MED ORDER — POTASSIUM PHOSPHATES 15 MMOLE/5ML IV SOLN
30.0000 mmol | Freq: Once | INTRAVENOUS | Status: AC
  Administered 2024-05-09: 30 mmol via INTRAVENOUS
  Filled 2024-05-09: qty 10

## 2024-05-09 MED ORDER — ACETAMINOPHEN 10 MG/ML IV SOLN
1000.0000 mg | Freq: Four times a day (QID) | INTRAVENOUS | Status: AC
  Administered 2024-05-09 – 2024-05-10 (×4): 1000 mg via INTRAVENOUS
  Filled 2024-05-09 (×4): qty 100

## 2024-05-09 MED ORDER — FUROSEMIDE 10 MG/ML IJ SOLN
20.0000 mg | Freq: Once | INTRAMUSCULAR | Status: AC
  Administered 2024-05-09: 20 mg via INTRAVENOUS
  Filled 2024-05-09: qty 2

## 2024-05-09 NOTE — Progress Notes (Signed)
 Nutrition Brief Note  Patient TPN increased to goal rate yesterday. Continues to report some soreness/abdominal pain post op. No BM yet. Plan for upper GI series around post op day 5 to evaluate for leak. Will continue to monitor for diet advancement and ability to wean nutrition support. NGT remains in place with 90mL output documented yesterday.  PHOS low this morning. WBCs trending down.   INTERVENTION:  Parenteral Nutrition - titrate to meet estimated nutrition needs.  Kcal: 2100-2300 kcals Protein: 100-115g   Continue thiamine 100mg  daily x5 days d/t refeeding risk MVI w/ minerals to TPN    Monitor diet advancement/tolerance for ability to wean nutrition support  NUTRITION DIAGNOSIS:  Inadequate oral intake related to inability to eat as evidenced by NPO status.   GOAL:  Patient will meet greater than or equal to 90% of their needs  Blair Deaner MS, RD, LDN Registered Dietitian Clinical Nutrition RD Inpatient Contact Info in Amion

## 2024-05-09 NOTE — Progress Notes (Addendum)
°  Progress Note    05/09/2024 10:07 AM 2 Days Post-Op  Subjective: No complaints today  Vitals:   05/09/24 0700 05/09/24 0714  BP: 110/62 105/70  Pulse: (!) 103 99  Resp: 14 14  Temp:  (!) 97.4 F (36.3 C)  SpO2: 94% 94%    Physical Exam: Awake alert and oriented Left medial leg wound VAC to suction Palpable left PT pulse  CBC    Component Value Date/Time   WBC 20.5 (H) 05/09/2024 0420   RBC 3.03 (L) 05/09/2024 0420   HGB 8.5 (L) 05/09/2024 0420   HCT 27.7 (L) 05/09/2024 0420   PLT 162 05/09/2024 0420   MCV 91.4 05/09/2024 0420   MCH 28.1 05/09/2024 0420   MCHC 30.7 05/09/2024 0420   RDW 17.1 (H) 05/09/2024 0420   LYMPHSABS 1.0 03/26/2024 0640   MONOABS 1.0 03/26/2024 0640   EOSABS 0.0 03/26/2024 0640   BASOSABS 0.0 03/26/2024 0640    BMET    Component Value Date/Time   NA 135 05/09/2024 0420   K 3.6 05/09/2024 0420   CL 98 05/09/2024 0420   CO2 34 (H) 05/09/2024 0420   GLUCOSE 126 (H) 05/09/2024 0420   BUN 18 05/09/2024 0420   CREATININE 0.48 (L) 05/09/2024 0420   CALCIUM  7.4 (L) 05/09/2024 0420   GFRNONAA >60 05/09/2024 0420   GFRAA >90 11/01/2012 0522    INR    Component Value Date/Time   INR 1.2 05/04/2024 0550     Intake/Output Summary (Last 24 hours) at 05/09/2024 1007 Last data filed at 05/09/2024 0955 Gross per 24 hour  Intake 2289.69 ml  Output 3340 ml  Net -1050.31 ml     Assessment/plan:  78 y.o. male is I&D left medial leg postsurgical wound now status post treatment of perforated duodenal ulcer by general surgery.  Plan will be for wound VAC change tomorrow at bedside.  He will need Plavix  resumed when okay with general surgery and ultimately will require Eliquis  for treatment of atrial fibrillation.   Brandon C. Sheree, MD [facility] Office: [phone] Pager: [phone]  05/09/2024 10:07 AM   Left medial leg wound VAC changed this afternoon. Healthy appearing granulation tissue in  wound bed as shown below. Black foam sponge and VAC replaced with good seal

## 2024-05-09 NOTE — Progress Notes (Addendum)
 2 Days Post-Op   Subjective/Chief Complaint: Comfortable this morning, reports soreness with movement.  No BM yet.   Objective: Vital signs in last 24 hours: Temp:  [97.4 F (36.3 C)-98.3 F (36.8 C)] 97.4 F (36.3 C) (12/11 0714) Pulse Rate:  [93-104] 99 (12/11 0714) Resp:  [10-20] 14 (12/11 0714) BP: (101-130)/(60-83) 105/70 (12/11 0714) SpO2:  [91 %-97 %] 94 % (12/11 0714) Weight:  [78.8 kg] 78.8 kg (12/11 0317) Last BM Date : 05/02/24  Intake/Output from previous day: 12/10 0701 - 12/11 0700 In: 891.3 [I.V.:651.3; NG/GT:90; IV Piggyback:150] Out: 3430 [Urine:3200; Drains:230] Intake/Output this shift: No intake/output data recorded.  Exam: Awake and alert, follows commands Looks comfortable Abdomen soft, mid distention, drain serosang, dressing dry  Lab Results:  Recent Labs    05/08/24 0500 05/08/24 1410 05/09/24 0420  WBC 19.2*  --  20.5*  HGB 9.1* 9.4* 8.5*  HCT 29.3* 29.6* 27.7*  PLT 173  --  162   BMET Recent Labs    05/08/24 0500 05/09/24 0420  NA 133* 135  K 4.4 3.6  CL 100 98  CO2 25 34*  GLUCOSE 149* 126*  BUN 22 18  CREATININE 0.84 0.48*  CALCIUM  7.7* 7.4*   PT/INR No results for input(s): LABPROT, INR in the last 72 hours. ABG No results for input(s): PHART, HCO3 in the last 72 hours.  Invalid input(s): PCO2, PO2  Studies/Results: VAS US  LOWER EXTREMITY VENOUS (DVT) Result Date: 05/08/2024  Lower Venous DVT Study Patient Name:  Kyle Kelley  Date of Exam:   05/08/2024 Medical Rec #: [id number]       Accession #:    [id number] Date of Birth: 16-Apr-1946       Patient Gender: M Patient Age:   78 years Exam Location:  [facility] Procedure:      VAS US  LOWER EXTREMITY VENOUS (DVT) Referring Phys: A POWELL JR --------------------------------------------------------------------------------  Indications: Edema.  Risk Factors: None identified. Limitations: Poor ultrasound/tissue interface. Comparison Study: No prior  studies. Performing Technologist: Cordella Collet RVT  Examination Guidelines: A complete evaluation includes B-mode imaging, spectral Doppler, color Doppler, and power Doppler as needed of all accessible portions of each vessel. Bilateral testing is considered an integral part of a complete examination. Limited examinations for reoccurring indications may be performed as noted. The reflux portion of the exam is performed with the patient in reverse Trendelenburg.  +---------+---------------+---------+-----------+----------+--------------+ RIGHT    CompressibilityPhasicitySpontaneityPropertiesThrombus Aging +---------+---------------+---------+-----------+----------+--------------+ CFV      Full           Yes      Yes                                 +---------+---------------+---------+-----------+----------+--------------+ SFJ      Full                                                        +---------+---------------+---------+-----------+----------+--------------+ FV Prox  Full                                                        +---------+---------------+---------+-----------+----------+--------------+  FV Mid   Full                                                        +---------+---------------+---------+-----------+----------+--------------+ FV DistalFull                                                        +---------+---------------+---------+-----------+----------+--------------+ PFV      Full                                                        +---------+---------------+---------+-----------+----------+--------------+ POP      Full           Yes      Yes                                 +---------+---------------+---------+-----------+----------+--------------+ PTV      Full                                                        +---------+---------------+---------+-----------+----------+--------------+ PERO     Full                                                         +---------+---------------+---------+-----------+----------+--------------+   +----+---------------+---------+-----------+----------+--------------+ LEFTCompressibilityPhasicitySpontaneityPropertiesThrombus Aging +----+---------------+---------+-----------+----------+--------------+ CFV Full           Yes      Yes                                 +----+---------------+---------+-----------+----------+--------------+    Summary: RIGHT: - There is no evidence of deep vein thrombosis in the lower extremity.  - A cystic structure is found in the popliteal fossa.  LEFT: - No evidence of common femoral vein obstruction.   *See table(s) above for measurements and observations. Electronically signed by Debby Robertson on 05/08/2024 at 10:13:36 PM.    Final    DG CHEST PORT 1 VIEW Result Date: 05/07/2024 EXAM: 1 VIEW(S) XRAY OF THE CHEST 05/07/2024 09:43:00 PM COMPARISON: 05/07/2024 CLINICAL HISTORY: S/P PICC central line placement FINDINGS: LINES, TUBES AND DEVICES: Right chest wall PICC in place with tip overlying the expected region of the superior caval junction. Right IJ central venous catheter in place with tip overlying the expected region of the superior caval junction. Enteric tube in place coursing below the hemidiaphragm with tip and side port. LUNGS AND PLEURA: Chronic coarsened interstitial markings without overt pulmonary edema. Small bilateral pleural effusions. No pneumothorax. HEART AND MEDIASTINUM: Atherosclerotic plaque noted. No acute abnormality of the cardiac and mediastinal silhouettes.  BONES AND SOFT TISSUES: Persistent gas under right hemidiaphragm likely related to recent peritoneal dialysis catheter placement. No acute osseous abnormality. IMPRESSION: 1. Right chest wall PICC, right IJ central venous catheter, and enteric tube in appropriate positions. 2. Small bilateral pleural effusions. 3. Persistent gas under the right hemidiaphragm, likely  related to recent peritoneal dialysis catheter placement. Electronically signed by: Morgane Naveau MD 05/07/2024 09:49 PM EST RP Workstation: [id number]   DG CHEST PORT 1 VIEW Result Date: 05/07/2024 CLINICAL DATA:  Central line placement EXAM: PORTABLE CHEST 1 VIEW COMPARISON:  Prior chest x-ray obtained earlier today at 1:30 a.m. FINDINGS: New right IJ approach central venous catheter. The catheter tip overlies the mid SVC. Partially imaged gastric tube in place. The tip lies off the field of view, presumably within the stomach. No pneumothorax. Small bilateral layering pleural effusions and associated bibasilar atelectasis. Pulmonary vascular congestion with mild pulmonary edema. IMPRESSION: 1. New right IJ central venous catheter with the tip overlying the mid SVC. 2. No evidence of pneumothorax or other complication. 3. Pulmonary edema and bilateral layering pleural effusions suggest CHF. Electronically Signed   By: Wilkie Lent M.D.   On: 05/07/2024 16:17   US  EKG SITE RITE Result Date: 05/07/2024 If Site Rite image not attached, placement could not be confirmed due to current cardiac rhythm.   Anti-infectives: Anti-infectives (From admission, onward)    Start     Dose/Rate Route Frequency Ordered Stop   05/07/24 1930  micafungin  (MYCAMINE ) 100 mg in sodium chloride  0.9 % 100 mL IVPB        100 mg 105 mL/hr over 1 Hours Intravenous Every 24 hours 05/07/24 1832 05/12/24 1929   05/07/24 0000  piperacillin -tazobactam (ZOSYN ) IVPB 3.375 g        3.375 g 12.5 mL/hr over 240 Minutes Intravenous Every 8 hours 05/06/24 2241     05/03/24 0842  ceFAZolin  (ANCEF ) IVPB 2g/100 mL premix        2 g 200 mL/hr over 30 Minutes Intravenous 30 min pre-op 05/03/24 9157 05/03/24 1157       Assessment/Plan: POD 2 s/p perforated duodenal ulcer with abscess 12/09 by Dr. Vernetta -s/p EGD 12/6 for UGIB with findings of gastritis and duodenal ulcer, pneumoperitoneum on CT 12/8 -continue NG.  Plan upper  GI POD#5 (12/14)  to evaluate for leak -TNA -IV Zosyn . WBC 29 > 19 > 20, monitor -wet to dry dressing changes to midline wound - ok to D/C foley from a CCS standpoint, discussed with medical team - will need to start DVT ppx vs hep gtt once hgb stable   -LLE limb ischemia s/p thrombectomy and subsequent I&D infected hematoma/VAC placement; VVS would like to start plavix  once hgb stable Afib - anticoag was held due to GIB.  Aortic stenosis HTN HLD   Almarie GORMAN Pringle 05/09/2024

## 2024-05-09 NOTE — Progress Notes (Addendum)
 [facility]  PROGRESS NOTE  Kyle Kelley FMW:[id number] DOB: [birth year] DOA: 05/03/2024 PCP: Charlott Dorn LABOR, MD   Brief HPI:   78 year old male with extensive history of atrial fibrillation (on Eliquis /recently held), vasculopath, with acute on chronic ischemic left lower extremity nonhealing wound, HTN, HLD, Aortic stenosis, CVA, prostate cancer s/p prostatectomy 2014 who presented on 12/5 to the vascular team for follow-up left lower extremity wound.    He presented 03/25/2024 for acute left limb ischemia, vascular was consulted and underwent left lower extremity thromboembolectomy via common femoral and below knee popliteal artery exposure incisions using 3 and 4 fogarty balloons, left tibioperoneal trunk endarterectomy with vein patch angioplasty.  Harvest L greater saphenous vein.  Stent L SFA.   In outpatient follow up, 04/29/2024 worsening wound dehiscence was noted.  He was brought back 05/03/2024 for debridement in OR and placement of wound VAC.   Patient Eliquis  has been held since 04/29/2024.   Labs on 12/5 revealed hemoglobin of 6.2.  Patient was admitted for evaluation of the anemia and was transfused.  Gastroenterology was consulted.  Patient underwent EGD on 12/6.   Patient had persistent abdominal pain after the procedure.  On 12/7, he developed lactic acidosis and leukocytosis.  Responded to fluids, lactic acid trended down.  Patient denied severe symptoms.  On 12/8, patient noted to have worsening leukocytosis.  CT C/A/P ordered and revealed free air, likely due to perforated duodenal ulcer.     Surgery was consulted, he's now s/p ex lap/graham patch.       Assessment/Plan:   Perforated duodenal ulcer Duodenal ulcer was noted on EGD on 12/7 CT abdomen/pelvis on 12/8 showed small volume pneumoperitoneum, in right upper quadrant with focus of free air appearing to track to proximal duodenum suspicious for profound duodenal ulcer General surgery was consulted;  underwent explorative laparotomy, Arlyss patch repair of perforated duodenal ulcer on 12/9 General Surgery following, plan for upper GI, POD #5, on 12/14 to evaluate for leak Continue empiric Zosyn  Continue TNA  Critical limb ischemia/left lower extremity Came to ED for acute limb ischemia on 03/25/2024 Underwent left lower extremity thromboembolectomy, subsequent I&D of infected hematoma/VAC placement Plan for wound VAC change tomorrow at bedside Eliquis  has been held since 04/29/2024 Plavix  to resume once okay with general surgery, ultimately will require Eliquis  for atrial fibrillation  GI bleed Labs on 12/5 revealed hemoglobin of 6.2 GI was consulted; underwent EGD on 12/6  Revealed duodenal ulcer and gastritis PPI twice daily for 8 weeks; Eliquis  on hold due to above Anemia workup showed iron 21, saturation 5%, ferritin 60 B12 907, folate 18 Hemoglobin stable at 8.5  Acute hypoxemic respiratory failure Not on oxygen at baseline Chest x-ray from 12/9 showed bilateral pleural effusions CT on 12/8 showed moderate bilateral effusion, diffuse body wall edema Diuresed well with IV Lasix .  Continue to monitor and wean off oxygen as tolerated  Bilateral lower extremity edema/acute on chronic HFpEF Albumin  is low at 1.8 Anasarca from hypoalbuminemia Venous duplex of lower extremities negative for DVT Continue TPN Echocardiogram from 10/25 showed preserved EF; BNP elevated 351.1 Will give Lasix  20 mg IV x 1 Discontinue Foley; Place condom cath  Lactic acidosis Resolved Likely in setting of overdiuresis causing possible sepsis  Atrial fibrillation Heart rate is controlled Continue scheduled metoprolol  5 mg IV every 6 hours Eliquis  on hold due to above   Hyponatremia. Mild Resolved   Essential hypertension, benign Blood pressure is stable Continue scheduled metoprolol    History of stroke  without residual deficits Stable Eliquis  on hold   Nodular Hepatic Contour   labs with preserved synthetic function    Aortic stenosis Last echo 03/18/2024-reviewed EF 55-60%, normal LV function,  mildly enlarged right ventricle, mildly reduced RV function, aortic valve was not well visualized no significant stenosis was reported  Per cardiology, recommend repeat echocardiogram in the next 1 to 2 months as outpatient  for high-quality imaging to reassess severity of aortic valve stenosis.   ICH (intracerebral hemorrhage) (HCC) Remote history of intracranial hemorrhage Noted   Mixed hyperlipidemia Crestor  on hold   Stage II sacral decubitus ulcer Present on admission. Continue wound care       DVT prophylaxis: SCDs  Medications     Chlorhexidine  Gluconate Cloth  6 each Topical Daily   insulin  aspart  0-9 Units Subcutaneous Q6H   liver oil-zinc  oxide   Topical BID   methocarbamol  (ROBAXIN ) injection  500 mg Intravenous Q8H   metoprolol  tartrate  5 mg Intravenous Q6H   pantoprazole  (PROTONIX ) IV  40 mg Intravenous Q12H   sodium chloride  flush  10-40 mL Intracatheter Q12H   sodium chloride  flush  3 mL Intravenous Q12H   sodium chloride  flush  3 mL Intravenous Q12H     Data Reviewed:   CBG:  Recent Labs  Lab 05/07/24 2344 05/08/24 0616 05/08/24 1209 05/08/24 1624 05/08/24 2358  GLUCAP 144* 145* 124* 125* 148*    SpO2: 94 % O2 Flow Rate (L/min): 3 L/min    Vitals:   05/09/24 0300 05/09/24 0317 05/09/24 0700 05/09/24 0714  BP: 104/74 104/74 110/62 105/70  Pulse: 98 95 (!) 103 99  Resp: 11 10 14 14   Temp:  97.8 F (36.6 C)  (!) 97.4 F (36.3 C)  TempSrc: Oral Oral  Oral  SpO2: 93% 94% 94% 94%  Weight:  78.8 kg    Height:          Data Reviewed:  Basic Metabolic Panel: Recent Labs  Lab 05/03/24 1804 05/05/24 0606 05/06/24 0419 05/06/24 2122 05/07/24 0437 05/08/24 0500 05/09/24 0420  NA  --    < > 132* 128* 130* 133* 135  K  --    < > 5.1 5.0 4.7 4.4 3.6  CL  --    < > 100 99 100 100 98  CO2  --    < > 24 23 16*  25 34*  GLUCOSE  --    < > 100* 146* 110* 149* 126*  BUN  --    < > 33* 34* 30* 22 18  CREATININE 0.71   < > 0.91 0.88 0.85 0.84 0.48*  CALCIUM   --    < > 7.9* 7.5* 7.8* 7.7* 7.4*  MG 2.2  --   --  2.2 2.2 2.0 1.9  PHOS 3.8  --   --   --  3.2 2.8 2.1*   < > = values in this interval not displayed.    CBC: Recent Labs  Lab 05/06/24 0419 05/06/24 1948 05/07/24 0437 05/08/24 0500 05/08/24 1410 05/09/24 0420  WBC 24.9* 27.2* 29.8* 19.2*  --  20.5*  HGB 11.7* 11.1* 10.2* 9.1* 9.4* 8.5*  HCT 36.3* 34.8* 31.6* 29.3* 29.6* 27.7*  MCV 89.2 90.2 88.8 91.8  --  91.4  PLT 254 232 225 173  --  162    LFT Recent Labs  Lab 05/05/24 0606 05/06/24 0419 05/07/24 0437 05/08/24 1410 05/09/24 0420  AST 29 21 23   --  29  ALT 21 19  20  --  22  ALKPHOS 49 47 49  --  46  BILITOT 0.6 0.6 0.8  --  0.6  PROT 5.3* 4.9* 5.2*  --  4.7*  ALBUMIN  2.1* 1.8* 1.7* 1.8* 1.6*     Antibiotics: Anti-infectives (From admission, onward)    Start     Dose/Rate Route Frequency Ordered Stop   05/07/24 1930  micafungin  (MYCAMINE ) 100 mg in sodium chloride  0.9 % 100 mL IVPB        100 mg 105 mL/hr over 1 Hours Intravenous Every 24 hours 05/07/24 1832 05/12/24 1929   05/07/24 0000  piperacillin -tazobactam (ZOSYN ) IVPB 3.375 g        3.375 g 12.5 mL/hr over 240 Minutes Intravenous Every 8 hours 05/06/24 2241     05/03/24 0842  ceFAZolin  (ANCEF ) IVPB 2g/100 mL premix        2 g 200 mL/hr over 30 Minutes Intravenous 30 min pre-op 05/03/24 9157 05/03/24 1157        CONSULTS vascular surgery  Code Status: Full code  Family Communication: No family at bedside     Subjective   Wants Foley catheter to be removed   Objective    Physical Examination:  Appears in no acute distress Abdomen is soft, mild epigastric tenderness to palpation Extremities-bilateral 3+ edema in lower extremities Lungs are clear to auscultation bilaterally     Wound 05/03/24 1900 Pressure Injury Sacrum Mid Stage 2  -  Partial thickness loss of dermis presenting as a shallow open injury with a red, pink wound bed without slough. (Active)        Kyle Kelley Brod   [facility] If 7PM-7AM, please contact night-coverage at [email or web address], Office  [phone]   05/09/2024, 8:13 AM  LOS: 6 days

## 2024-05-10 DIAGNOSIS — R42 Dizziness and giddiness: Secondary | ICD-10-CM

## 2024-05-10 DIAGNOSIS — I70222 Atherosclerosis of native arteries of extremities with rest pain, left leg: Secondary | ICD-10-CM

## 2024-05-10 DIAGNOSIS — K251 Acute gastric ulcer with perforation: Secondary | ICD-10-CM

## 2024-05-10 LAB — BASIC METABOLIC PANEL WITH GFR
Anion gap: 7 (ref 5–15)
BUN: 17 mg/dL (ref 8–23)
CO2: 31 mmol/L (ref 22–32)
Calcium: 7.3 mg/dL — ABNORMAL LOW (ref 8.9–10.3)
Chloride: 96 mmol/L — ABNORMAL LOW (ref 98–111)
Creatinine, Ser: 0.46 mg/dL — ABNORMAL LOW (ref 0.61–1.24)
GFR, Estimated: >60 mL/min (ref >60)
Glucose, Bld: 139 mg/dL — ABNORMAL HIGH (ref 70–99)
Potassium: 3.4 mmol/L — ABNORMAL LOW (ref 3.5–5.1)
Sodium: 134 mmol/L — ABNORMAL LOW (ref 135–145)

## 2024-05-10 LAB — GLUCOSE, CAPILLARY
Glucose-Capillary: 144 mg/dL — ABNORMAL HIGH (ref 70–99)
Glucose-Capillary: 146 mg/dL — ABNORMAL HIGH (ref 70–99)
Glucose-Capillary: 151 mg/dL — ABNORMAL HIGH (ref 70–99)
Glucose-Capillary: 149 mg/dL — ABNORMAL HIGH (ref 70–99)

## 2024-05-10 LAB — CBC
HCT: 28.7 % — ABNORMAL LOW (ref 39.0–52.0)
Hemoglobin: 9.1 g/dL — ABNORMAL LOW (ref 13.0–17.0)
MCH: 28.9 pg (ref 26.0–34.0)
MCHC: 31.7 g/dL (ref 30.0–36.0)
MCV: 91.1 fL (ref 80.0–100.0)
Platelets: 142 K/uL — ABNORMAL LOW (ref 150–400)
RBC: 3.15 MIL/uL — ABNORMAL LOW (ref 4.22–5.81)
RDW: 17.4 % — ABNORMAL HIGH (ref 11.5–15.5)
WBC: 14.6 K/uL — ABNORMAL HIGH (ref 4.0–10.5)
nRBC: 0 % (ref 0.0–0.2)

## 2024-05-10 LAB — MAGNESIUM: Magnesium: 1.8 mg/dL (ref 1.7–2.4)

## 2024-05-10 LAB — PHOSPHORUS: Phosphorus: 2 mg/dL — ABNORMAL LOW (ref 2.5–4.6)

## 2024-05-10 MED ORDER — POTASSIUM PHOSPHATES 15 MMOLE/5ML IV SOLN
45.0000 mmol | Freq: Once | INTRAVENOUS | Status: AC
  Administered 2024-05-10: 45 mmol via INTRAVENOUS
  Filled 2024-05-10: qty 15

## 2024-05-10 MED ORDER — THIAMINE HCL 100 MG/ML IJ SOLN
100.0000 mg | Freq: Once | INTRAMUSCULAR | Status: AC
  Administered 2024-05-10: 100 mg via INTRAVENOUS
  Filled 2024-05-10: qty 2

## 2024-05-10 MED ORDER — TRACE MINERALS CU-MN-SE-ZN 300-55-60-3000 MCG/ML IV SOLN
INTRAVENOUS | Status: AC
  Filled 2024-05-10: qty 768

## 2024-05-10 MED ORDER — POTASSIUM CHLORIDE 20 MEQ PO PACK: 40.0000 meq | PACK | Freq: Once | ORAL | Status: DC

## 2024-05-10 MED ORDER — MAGNESIUM SULFATE 2 GM/50ML IV SOLN
2.0000 g | Freq: Once | INTRAVENOUS | Status: AC
  Administered 2024-05-10: 2 g via INTRAVENOUS
  Filled 2024-05-10: qty 50

## 2024-05-10 MED ORDER — POTASSIUM CHLORIDE CRYS ER 20 MEQ PO TBCR
40.0000 meq | EXTENDED_RELEASE_TABLET | Freq: Once | ORAL | Status: DC
  Filled 2024-05-10: qty 2

## 2024-05-10 MED ORDER — FUROSEMIDE 10 MG/ML IJ SOLN
20.0000 mg | Freq: Once | INTRAMUSCULAR | Status: AC
  Administered 2024-05-10: 20 mg via INTRAVENOUS
  Filled 2024-05-10: qty 2

## 2024-05-10 NOTE — Plan of Care (Signed)

## 2024-05-10 NOTE — Progress Notes (Signed)
 PHARMACY - TOTAL PARENTERAL NUTRITION CONSULT NOTE   Indication: perforated viscus   Patient Measurements: Height: 6' 1 (185.4 cm) Weight: 81 kg (178 lb 9.2 oz) IBW/kg (Calculated) : 79.9 TPN AdjBW (KG): 77.6 Body mass index is 23.56 kg/m. Usual Weight: ~78 kg (previous weights between 159 - 171 lbs over past 6 months)  Assessment:  78 y.o. male with history of atrial fibrillation and history of prostate cancer s/p prostatectomy and radiation therapy who underwent debridement of left below knee postsurgical wound 12/06 and was found to have severe anemia with a hemoglobin of 6.2 post-op. Patient states that for the past 3 months he has noticed that stools have been unusually dark, black, usually 1 to 2/day.   GI was consulted 12/06 due to persistent abdominal pain post-procedure prompting CT scan. EGD was notable for non-bleeding ulcer in the duodenal bulb. CT with small volume pneumoperitoneum tracking near the duodenum. Patient has been started on BID PPI and empiric Zosyn . Surgery recommends bowel rest with NGTS, TPN for nutrtion while NPO. Pharmacy consulted to initiate TPN.  Glucose / Insulin : CBGs 120-140s on TPN (4 units SSI/24 hrs)  - 02/2024 A1c 5.7%; CBGs controlled, no insulin  PTA Electrolytes: K 3.4 (s/p Kphox 12/11), Mg 1.8, Phos 2.0, Na 134, CoCa 9.3 Renal: Scr 0.46, BUN 17 Hepatic: LFTs wnl, Tbili 0.6, Alb 1.6, bsl TG 02/2024 32 Intake / Output; MIVF:  UOP 0.9 mL/kg/hr, LBM 12/4, drains 75 mL  GI Imaging: 12/08 CT: small volume pneumoperitoneum suspicious for perforated duodenal ulcer, small volume free fluid with layering hyperattenuation in the lower abdomen/pelvis, which may represent hemoperitoneum GI Surgeries / Procedures:  none since start of TPN  Central access: 05/07/24 TPN start date:  05/07/24  Nutritional Goals: Goal TPN rate is 80 mL/hr (provides 117 g of protein and ~1960 kcals per day)  RD Assessment:  Estimated Needs Total Energy Estimated  Needs: 2100-2300 kcals Total Protein Estimated Needs: 100-115g Total Fluid Estimated Needs: >2L/day  Current Nutrition:  NPO 12/09 >> NGTS 12/09 >>  Plan:  Continue TPN to 80 mL/hr at 1800 to provide 100% daily nutritional needs Electrolytes in TPN: Na 55 mEq/L, K 50 mEq/L, Ca 5 mEq/L, Mg 8 mEq/L, and Phos 20 mmol/L. Cl:Ac max Cl Kphos 45 mmol IV x1 outside of TPN Mg 2g IV x1 outside of TPN Add standard MVI and trace elements to TPN Add thiamine 100mg  IV to TPN x5 days total (EOT 05/14/24) Continue Sensitive q6h SSI and adjust as needed, DC tomorrow if low use Reduce MIVF to 10 mL/hr at 1800 Monitor TPN labs on Mon/Thurs, daily PRN  Thank you for allowing pharmacy to be a part of this patients care.  Shelba Collier, PharmD, BCPS Clinical Pharmacist

## 2024-05-10 NOTE — Progress Notes (Signed)
 Physical Therapy Treatment Patient Details Name: Kyle Kelley MRN: [id number] DOB: 11-12-1945 Today's Date: 05/10/2024   History of Present Illness Kyle Kelley is a 78 y.o. male admitted 05/03/24 with symptomatic anemia. He has been followed closely by vascular team outpatient and at visit 12/1 noted worsening wound dehiscence brought in 12/5 for debridement in OR and placement of wound VAC. Pt underwent EGD 12/6, which showed  gastritis, duodenal ulcers clean base nonbleeding, random gastric biopsies r/o h-pylori. PMHx: atrial fibrillation (on Eliquis /recently held), vasculopath, acute on chronic ischemic LLE nonhealing wound, HTN, HLD, aortic stenosis, CVA, prostate cancer s/p prostatectomy 2014, and PVD.    PT Comments  Pt resting in bed on arrival, pleasant and agreeable to session with pt demonstrating steady progress towards acute goals. Pt limited in safe mobility by decreased activity tolerance with elevated HR, LE weakness, abdominal pain, and impaired balance/postural reactions. Pt requiring decreased assist this session for mobility, performing bed mobility, transfers and short in room ambulation with RW for support with grossly CGA for safety. No knee buckling noted this session and noted increased general stability in standing throughout. Pt HR tachy up to 152bpm with activity. SpO2 94% on 2L. Pt up in chair at end of session with all needs met with pt verbalizing understanding of importance of time OOB and frequent mobilization to maximize functional mobility goals. Pt continues to benefit from skilled PT services to progress toward functional mobility goals.     If plan is discharge home, recommend the following: A little help with walking and/or transfers;A little help with bathing/dressing/bathroom;Assistance with cooking/housework;Assist for transportation;Help with stairs or ramp for entrance   Can travel by private vehicle        Equipment Recommendations  None recommended by  PT    Recommendations for Other Services       Precautions / Restrictions Precautions Precautions: Fall;Other (comment) (NG tube) Recall of Precautions/Restrictions: Intact Precaution/Restrictions Comments: wound vac LLE Restrictions Weight Bearing Restrictions Per Provider Order: No     Mobility  Bed Mobility Overal bed mobility: Needs Assistance Bed Mobility: Supine to Sit     Supine to sit: Min assist     General bed mobility comments: light min A to elevate trunk to sitting    Transfers Overall transfer level: Needs assistance Equipment used: Rolling walker (2 wheels) Transfers: Sit to/from Stand Sit to Stand: Contact guard assist, From elevated surface           General transfer comment: CGA from slightly elevated EOB, cues for optimal hand placement    Ambulation/Gait Ambulation/Gait assistance: Contact guard assist Gait Distance (Feet): 20 Feet Assistive device: Rolling walker (2 wheels) Gait Pattern/deviations: Step-through pattern, Decreased stride length, Trunk flexed Gait velocity: decreased     General Gait Details: CGA for safety, no knee buckling, no LOB. Distance limtied as HR tachy up to 156bpm briefly   Stairs             Wheelchair Mobility     Tilt Bed    Modified Rankin (Stroke Patients Only)       Balance Overall balance assessment: Needs assistance Sitting-balance support: Bilateral upper extremity supported, Feet supported Sitting balance-Leahy Scale: Fair Sitting balance - Comments: EOB   Standing balance support: Bilateral upper extremity supported, During functional activity, Reliant on assistive device for balance, Single extremity supported Standing balance-Leahy Scale: Poor Standing balance comment: reliant on UE support  Communication Communication Communication: Impaired Factors Affecting Communication: Hearing impaired  Cognition Arousal: Alert Behavior During  Therapy: WFL for tasks assessed/performed   PT - Cognitive impairments: Problem solving, Safety/Judgement                         Following commands: Impaired Following commands impaired: Follows one step commands with increased time    Cueing Cueing Techniques: Verbal cues, Gestural cues  Exercises      General Comments General comments (skin integrity, edema, etc.): wife present and supportive      Pertinent Vitals/Pain Pain Assessment Pain Assessment: Faces Faces Pain Scale: Hurts little more Pain Location: abdomen Pain Descriptors / Indicators: Sore Pain Intervention(s): Monitored during session, Limited activity within patient's tolerance    Home Living                          Prior Function            PT Goals (current goals can now be found in the care plan section) Acute Rehab PT Goals Patient Stated Goal: Return Home PT Goal Formulation: With patient/family Time For Goal Achievement: 05/19/24 Progress towards PT goals: Progressing toward goals    Frequency    Min 2X/week      PT Plan      Co-evaluation              AM-PAC PT 6 Clicks Mobility   Outcome Measure  Help needed turning from your back to your side while in a flat bed without using bedrails?: A Little Help needed moving from lying on your back to sitting on the side of a flat bed without using bedrails?: A Little Help needed moving to and from a bed to a chair (including a wheelchair)?: A Little Help needed standing up from a chair using your arms (e.g., wheelchair or bedside chair)?: A Little Help needed to walk in hospital room?: A Lot Help needed climbing 3-5 steps with a railing? : A Lot 6 Click Score: 16    End of Session Equipment Utilized During Treatment: Gait belt Activity Tolerance: Patient tolerated treatment well Patient left: with call bell/phone within reach;with family/visitor present;in chair Nurse Communication: Mobility status PT Visit  Diagnosis: Difficulty in walking, not elsewhere classified (R26.2);Other abnormalities of gait and mobility (R26.89)     Time: 9144-9073 PT Time Calculation (min) (ACUTE ONLY): 31 min  Charges:    $Therapeutic Exercise: 8-22 mins $Therapeutic Activity: 8-22 mins PT General Charges $$ ACUTE PT VISIT: 1 Visit                     Chelsey Redondo R. PTA Acute Rehabilitation Services Office: [phone]   Therisa CHRISTELLA Boor 05/10/2024, 9:34 AM

## 2024-05-10 NOTE — Progress Notes (Signed)
 [facility]  PROGRESS NOTE  Kyle Kelley FMW:[id number] DOB: 11-29-1945 DOA: 05/03/2024 PCP: Charlott Dorn LABOR, MD   Brief HPI:   78 year old male with extensive history of atrial fibrillation (on Eliquis /recently held), vasculopath, with acute on chronic ischemic left lower extremity nonhealing wound, HTN, HLD, Aortic stenosis, CVA, prostate cancer s/p prostatectomy 2014 who presented on 12/5 to the vascular team for follow-up left lower extremity wound.    He presented 03/25/2024 for acute left limb ischemia, vascular was consulted and underwent left lower extremity thromboembolectomy via common femoral and below knee popliteal artery exposure incisions using 3 and 4 fogarty balloons, left tibioperoneal trunk endarterectomy with vein patch angioplasty.  Harvest L greater saphenous vein.  Stent L SFA.   In outpatient follow up, 04/29/2024 worsening wound dehiscence was noted.  He was brought back 05/03/2024 for debridement in OR and placement of wound VAC.   Patient Eliquis  has been held since 04/29/2024.   Labs on 12/5 revealed hemoglobin of 6.2.  Patient was admitted for evaluation of the anemia and was transfused.  Gastroenterology was consulted.  Patient underwent EGD on 12/6.   Patient had persistent abdominal pain after the procedure.  On 12/7, he developed lactic acidosis and leukocytosis.  Responded to fluids, lactic acid trended down.  Patient denied severe symptoms.  On 12/8, patient noted to have worsening leukocytosis.  CT C/A/P ordered and revealed free air, likely due to perforated duodenal ulcer.     Surgery was consulted, he's now s/p ex lap/graham patch.       Assessment/Plan:   Perforated duodenal ulcer Duodenal ulcer was noted on EGD on 12/7 CT abdomen/pelvis on 12/8 showed small volume pneumoperitoneum, in right upper quadrant with focus of free air appearing to track to proximal duodenum suspicious for profound duodenal ulcer General surgery was consulted;  underwent explorative laparotomy, Arlyss patch repair of perforated duodenal ulcer on 12/9 General Surgery following, plan for upper GI, POD #5, on 12/14 to evaluate for leak Continue empiric Zosyn  Continue TNA  Critical limb ischemia/left lower extremity Came to ED for acute limb ischemia on 03/25/2024 Underwent left lower extremity thromboembolectomy, subsequent I&D of infected hematoma/VAC placement Plan for wound VAC change tomorrow at bedside Eliquis  has been held since 04/29/2024 Plavix  to resume once okay with general surgery, ultimately will require Eliquis  for atrial fibrillation  GI bleed Labs on 12/5 revealed hemoglobin of 6.2 GI was consulted; underwent EGD on 12/6  Revealed duodenal ulcer and gastritis PPI twice daily for 8 weeks; Eliquis  on hold due to above Anemia workup showed iron 21, saturation 5%, ferritin 60 B12 907, folate 18 Hemoglobin stable at 8.5  Acute hypoxemic respiratory failure Not on oxygen at baseline Chest x-ray from 12/9 showed bilateral pleural effusions CT on 12/8 showed moderate bilateral effusion, diffuse body wall edema Diuresed well with IV Lasix .  Continue to monitor and wean off oxygen as tolerated  Bilateral lower extremity edema/acute on chronic HFpEF Albumin  is low at 1.8 Anasarca from hypoalbuminemia Venous duplex of lower extremities negative for DVT Continue TPN Echocardiogram from 10/25 showed preserved EF; BNP elevated 351.1 Diuresed well with IV Lasix  given yesterday.  Will give additional dose of Lasix  20 mg IV x 1.   Discontinued Foley; Placed condom cath  Lactic acidosis Resolved Likely in setting of overdiuresis causing possible sepsis  Atrial fibrillation Heart rate is controlled Continue scheduled metoprolol  5 mg IV every 6 hours Eliquis  on hold due to above  Hypokalemia Potassium is 3.4. Replace potassium and follow  BMP in am  Hyponatremia. Mild Resolved   Essential hypertension, benign Blood pressure is  stable Continue scheduled metoprolol    History of stroke without residual deficits Stable Eliquis  on hold   Nodular Hepatic Contour  labs with preserved synthetic function    Aortic stenosis Last echo 03/18/2024-reviewed EF 55-60%, normal LV function,  mildly enlarged right ventricle, mildly reduced RV function, aortic valve was not well visualized no significant stenosis was reported  Per cardiology, recommend repeat echocardiogram in the next 1 to 2 months as outpatient  for high-quality imaging to reassess severity of aortic valve stenosis.   ICH (intracerebral hemorrhage) (HCC) Remote history of intracranial hemorrhage Noted   Mixed hyperlipidemia Crestor  on hold   Stage II sacral decubitus ulcer Present on admission. Continue wound care       DVT prophylaxis: SCDs  Medications     Chlorhexidine  Gluconate Cloth  6 each Topical Daily   insulin  aspart  0-9 Units Subcutaneous Q6H   liver oil-zinc  oxide   Topical BID   methocarbamol  (ROBAXIN ) injection  500 mg Intravenous Q8H   metoprolol  tartrate  5 mg Intravenous Q6H   pantoprazole  (PROTONIX ) IV  40 mg Intravenous Q12H   sodium chloride  flush  10-40 mL Intracatheter Q12H   sodium chloride  flush  3 mL Intravenous Q12H   sodium chloride  flush  3 mL Intravenous Q12H     Data Reviewed:   CBG:  Recent Labs  Lab 05/08/24 2358 05/09/24 1202 05/09/24 1759 05/09/24 2308 05/10/24 0609  GLUCAP 148* 136* 156* 133* 144*    SpO2: 93 % O2 Flow Rate (L/min): 3 L/min    Vitals:   05/09/24 1900 05/09/24 2300 05/10/24 0300 05/10/24 0600  BP: 111/70 120/68 127/80   Pulse: 93 (!) 111 (!) 102   Resp: 15 19 17    Temp: 98 F (36.7 C) 97.8 F (36.6 C) 97.7 F (36.5 C)   TempSrc: Oral Oral Oral   SpO2: 95% 95% 93%   Weight:    81 kg  Height:          Data Reviewed:  Basic Metabolic Panel: Recent Labs  Lab 05/03/24 1804 05/05/24 0606 05/06/24 2122 05/07/24 0437 05/08/24 0500 05/09/24 0420  05/10/24 0610  NA  --    < > 128* 130* 133* 135 134*  K  --    < > 5.0 4.7 4.4 3.6 3.4*  CL  --    < > 99 100 100 98 96*  CO2  --    < > 23 16* 25 34* 31  GLUCOSE  --    < > 146* 110* 149* 126* 139*  BUN  --    < > 34* 30* 22 18 17   CREATININE 0.71   < > 0.88 0.85 0.84 0.48* 0.46*  CALCIUM   --    < > 7.5* 7.8* 7.7* 7.4* 7.3*  MG 2.2  --  2.2 2.2 2.0 1.9 1.8  PHOS 3.8  --   --  3.2 2.8 2.1* 2.0*   < > = values in this interval not displayed.    CBC: Recent Labs  Lab 05/06/24 1948 05/07/24 0437 05/08/24 0500 05/08/24 1410 05/09/24 0420 05/10/24 0610  WBC 27.2* 29.8* 19.2*  --  20.5* 14.6*  HGB 11.1* 10.2* 9.1* 9.4* 8.5* 9.1*  HCT 34.8* 31.6* 29.3* 29.6* 27.7* 28.7*  MCV 90.2 88.8 91.8  --  91.4 91.1  PLT 232 225 173  --  162 142*    LFT Recent Labs  Lab  05/05/24 0606 05/06/24 0419 05/07/24 0437 05/08/24 1410 05/09/24 0420  AST 29 21 23   --  29  ALT 21 19 20   --  22  ALKPHOS 49 47 49  --  46  BILITOT 0.6 0.6 0.8  --  0.6  PROT 5.3* 4.9* 5.2*  --  4.7*  ALBUMIN  2.1* 1.8* 1.7* 1.8* 1.6*     Antibiotics: Anti-infectives (From admission, onward)    Start     Dose/Rate Route Frequency Ordered Stop   05/07/24 1930  micafungin  (MYCAMINE ) 100 mg in sodium chloride  0.9 % 100 mL IVPB        100 mg 105 mL/hr over 1 Hours Intravenous Every 24 hours 05/07/24 1832 05/12/24 1929   05/07/24 0000  piperacillin -tazobactam (ZOSYN ) IVPB 3.375 g        3.375 g 12.5 mL/hr over 240 Minutes Intravenous Every 8 hours 05/06/24 2241     05/03/24 0842  ceFAZolin  (ANCEF ) IVPB 2g/100 mL premix        2 g 200 mL/hr over 30 Minutes Intravenous 30 min pre-op 05/03/24 9157 05/03/24 1157        CONSULTS vascular surgery  Code Status: Full code  Family Communication: No family at bedside     Subjective   Breathing has improved.  Diuresed well with IV Lasix  given yesterday.   Objective    Physical Examination:  Appears in no acute distress S1-S2, regular, no murmur  auscultated Abdomen is soft, nontender, no organomegaly Extremities-bilateral 2+ edema in the lower extremities    Wound 05/03/24 1900 Pressure Injury Sacrum Mid Stage 2 -  Partial thickness loss of dermis presenting as a shallow open injury with a red, pink wound bed without slough. (Active)        Sabas GORMAN Brod   [facility] If 7PM-7AM, please contact night-coverage at [email or web address], Office  [phone]   05/10/2024, 7:50 AM  LOS: 7 days

## 2024-05-10 NOTE — Progress Notes (Signed)
°  Progress Note    05/10/2024 12:07 PM 3 Days Post-Op  Subjective: Feeling better  Vitals:   05/10/24 0700 05/10/24 1112  BP: 118/67 94/76  Pulse: 98 (!) 121  Resp: 13 15  Temp: 97.8 F (36.6 C) 97.7 F (36.5 C)  SpO2: 97% 98%    Physical Exam: Awake alert and oriented with NG tube in place Left lower extremity wound VAC to suction Left foot well-perfused with stable edema  CBC    Component Value Date/Time   WBC 14.6 (H) 05/10/2024 0610   RBC 3.15 (L) 05/10/2024 0610   HGB 9.1 (L) 05/10/2024 0610   HCT 28.7 (L) 05/10/2024 0610   PLT 142 (L) 05/10/2024 0610   MCV 91.1 05/10/2024 0610   MCH 28.9 05/10/2024 0610   MCHC 31.7 05/10/2024 0610   RDW 17.4 (H) 05/10/2024 0610   LYMPHSABS 1.0 03/26/2024 0640   MONOABS 1.0 03/26/2024 0640   EOSABS 0.0 03/26/2024 0640   BASOSABS 0.0 03/26/2024 0640    BMET    Component Value Date/Time   NA 134 (L) 05/10/2024 0610   K 3.4 (L) 05/10/2024 0610   CL 96 (L) 05/10/2024 0610   CO2 31 05/10/2024 0610   GLUCOSE 139 (H) 05/10/2024 0610   BUN 17 05/10/2024 0610   CREATININE 0.46 (L) 05/10/2024 0610   CALCIUM  7.3 (L) 05/10/2024 0610   GFRNONAA >60 05/10/2024 0610   GFRAA >90 11/01/2012 0522    INR    Component Value Date/Time   INR 1.2 05/04/2024 0550     Intake/Output Summary (Last 24 hours) at 05/10/2024 1207 Last data filed at 05/10/2024 0715 Gross per 24 hour  Intake 1832.63 ml  Output 1425 ml  Net 407.63 ml     Assessment:  78 y.o. male is s/p debridement left below knee postsurgical wound    Plan: Wound vac change Monday Plavix  when okay with general surgery and GI and will need Eliquis  prior to discharge   Scotland Dost C. Sheree, MD [facility] Office: [phone] Pager: [phone]  05/10/2024 12:07 PM

## 2024-05-10 NOTE — Progress Notes (Signed)
 PHARMACY - TOTAL PARENTERAL NUTRITION CONSULT NOTE  Indication: perforated viscus   Patient Measurements: Height: 6' 1 (185.4 cm) Weight: 81 kg (178 lb 9.2 oz) IBW/kg (Calculated) : 79.9 TPN AdjBW (KG): 77.6 Body mass index is 23.56 kg/m. Usual Weight: ~78 kg (previous weights between 159 - 171 lbs over past 6 months), now up to 81 kg  Assessment:  78 y.o. male with history of prostate cancer s/p prostatectomy and radiation therapy who underwent debridement of left below knee postsurgical wound 12/06.  Found to have severe anemia with a hemoglobin of 6.2 post-op. Patient states that for the past 3 months he has noticed that stools have been unusually dark, black, usually 1 to 2/day.   GI was consulted 12/06 due to persistent abdominal pain post-procedure prompting CT scan. EGD was notable for non-bleeding ulcer in the duodenal bulb. CT with small volume pneumoperitoneum tracking near the duodenum. Surgery recommends bowel rest with NGTS, TPN for nutrtion while NPO.  Glucose / Insulin : no hx DM, A1c 5.7% in 02/2024 - CBGs < 180 Used 4 units SSI in the past 24 hrs Electrolytes: K 3.6 and Phos at 2.3 post KPhos , Mag 2 post 2g, IV, others WNL Renal: SCr < 1, BUN WNL - Lasix  daily for the past 3 days Hepatic: LFTs / tbili WNL, albumin  1.6.  TG 32 on 03/27/24. Intake / Output; MIVF:  UOP 0.6 mL/kg/hr with Lasix  20mg  IV, NG 50mL, drain , LBM 12/4, net +2.9L GI Imaging: 12/8 CT: suspicious for perf duodenal ulcer, possible hemoperitoneum GI Surgeries / Procedures: none since start of TPN  Central access: 05/07/24 TPN start date:  05/07/24  Nutritional Goals: Goal TPN rate is 65 mL/hr (provides 105g AA and 2103 kCal per day)  RD Estimated Needs Total Energy Estimated Needs: 2100-2300 kcals Total Protein Estimated Needs: 100-115g Total Fluid Estimated Needs: >2L/day  Current Nutrition:  TPN  Plan:  Adjust TPN content to provide 100% of kCal needs and reduce free  water  Concentrated TPN at 65 ml/hr at 1800 Electrolytes in TPN: Na 65 mEq/L, K 62 mEq/L, Ca 5 mEq/L, Mg 10 mEq/L, Phos 25 mmol/L, max CL - adjusted with TPN rate change Add standard MVI and trace elements to TPN Add thiamine  100 mg/day to TPN x5 days total (last dose on 05/14/24) KPhos 30 mmol IV x 1 Continue sensitive SSI Q6H for now with TPN adjustment Monitor TPN labs on Mon/Thurs - labs in AM  Zaray Gatchel D. Lendell, PharmD, BCPS, BCCCP 05/11/2024, 7:19 AM

## 2024-05-10 NOTE — Progress Notes (Signed)
 3 Days Post-Op   Subjective/Chief Complaint: No acute events.  Foley removed and now voiding with external cath Some flatus, No BM yet   Objective: Vital signs in last 24 hours: Temp:  [97.3 F (36.3 C)-98 F (36.7 C)] 97.8 F (36.6 C) (12/12 0700) Pulse Rate:  [93-111] 98 (12/12 0700) Resp:  [13-19] 13 (12/12 0700) BP: (105-127)/(63-80) 118/67 (12/12 0700) SpO2:  [92 %-97 %] 97 % (12/12 0700) Weight:  [81 kg] 81 kg (12/12 0600) Last BM Date : 05/02/24  Intake/Output from previous day: 12/11 0701 - 12/12 0700 In: 3261.1 [I.V.:2215.7; NG/GT:30; IV Piggyback:1015.4] Out: 1825 [Urine:1750; Drains:75] Intake/Output this shift: No intake/output data recorded.  Exam: Awake and alert, follows commands Looks comfortable Abdomen soft, mid distention over lower abdomen - I think some of this is tight from anasarca/edema, drain SS, NG in place  Lab Results:  Recent Labs    05/09/24 0420 05/10/24 0610  WBC 20.5* 14.6*  HGB 8.5* 9.1*  HCT 27.7* 28.7*  PLT 162 142*   BMET Recent Labs    05/09/24 0420 05/10/24 0610  NA 135 134*  K 3.6 3.4*  CL 98 96*  CO2 34* 31  GLUCOSE 126* 139*  BUN 18 17  CREATININE 0.48* 0.46*  CALCIUM  7.4* 7.3*   PT/INR No results for input(s): LABPROT, INR in the last 72 hours. ABG No results for input(s): PHART, HCO3 in the last 72 hours.  Invalid input(s): PCO2, PO2  Studies/Results: VAS US  LOWER EXTREMITY VENOUS (DVT) Result Date: 05/08/2024  Lower Venous DVT Study Patient Name:  JORIEL STREETY  Date of Exam:   05/08/2024 Medical Rec #: [id number]       Accession #:    [id number] Date of Birth: 05-Feb-1946       Patient Gender: M Patient Age:   55 years Exam Location:  [facility] Procedure:      VAS US  LOWER EXTREMITY VENOUS (DVT) Referring Phys: A POWELL JR --------------------------------------------------------------------------------  Indications: Edema.  Risk Factors: None identified. Limitations: Poor  ultrasound/tissue interface. Comparison Study: No prior studies. Performing Technologist: Cordella Collet RVT  Examination Guidelines: A complete evaluation includes B-mode imaging, spectral Doppler, color Doppler, and power Doppler as needed of all accessible portions of each vessel. Bilateral testing is considered an integral part of a complete examination. Limited examinations for reoccurring indications may be performed as noted. The reflux portion of the exam is performed with the patient in reverse Trendelenburg.  +---------+---------------+---------+-----------+----------+--------------+ RIGHT    CompressibilityPhasicitySpontaneityPropertiesThrombus Aging +---------+---------------+---------+-----------+----------+--------------+ CFV      Full           Yes      Yes                                 +---------+---------------+---------+-----------+----------+--------------+ SFJ      Full                                                        +---------+---------------+---------+-----------+----------+--------------+ FV Prox  Full                                                        +---------+---------------+---------+-----------+----------+--------------+  FV Mid   Full                                                        +---------+---------------+---------+-----------+----------+--------------+ FV DistalFull                                                        +---------+---------------+---------+-----------+----------+--------------+ PFV      Full                                                        +---------+---------------+---------+-----------+----------+--------------+ POP      Full           Yes      Yes                                 +---------+---------------+---------+-----------+----------+--------------+ PTV      Full                                                         +---------+---------------+---------+-----------+----------+--------------+ PERO     Full                                                        +---------+---------------+---------+-----------+----------+--------------+   +----+---------------+---------+-----------+----------+--------------+ LEFTCompressibilityPhasicitySpontaneityPropertiesThrombus Aging +----+---------------+---------+-----------+----------+--------------+ CFV Full           Yes      Yes                                 +----+---------------+---------+-----------+----------+--------------+    Summary: RIGHT: - There is no evidence of deep vein thrombosis in the lower extremity.  - A cystic structure is found in the popliteal fossa.  LEFT: - No evidence of common femoral vein obstruction.   *See table(s) above for measurements and observations. Electronically signed by Debby Robertson on 05/08/2024 at 10:13:36 PM.    Final     Anti-infectives: Anti-infectives (From admission, onward)    Start     Dose/Rate Route Frequency Ordered Stop   05/07/24 1930  micafungin  (MYCAMINE ) 100 mg in sodium chloride  0.9 % 100 mL IVPB        100 mg 105 mL/hr over 1 Hours Intravenous Every 24 hours 05/07/24 1832 05/12/24 2159   05/07/24 0000  piperacillin -tazobactam (ZOSYN ) IVPB 3.375 g        3.375 g 12.5 mL/hr over 240 Minutes Intravenous Every 8 hours 05/06/24 2241     05/03/24 0842  ceFAZolin  (ANCEF ) IVPB 2g/100 mL premix        2 g 200 mL/hr  over 30 Minutes Intravenous 30 min pre-op 05/03/24 9157 05/03/24 1157       Assessment/Plan: POD 3 s/p perforated duodenal ulcer with abscess 12/09 by Dr. Vernetta -s/p EGD 12/6 for UGIB with findings of gastritis and duodenal ulcer, pneumoperitoneum on CT 12/8 -continue NG.  Plan upper GI POD#5 (12/14)  to evaluate for leak -TNA -IV Zosyn . WBC 20 >14, improving - wet to dry dressing changes to midline wound - will need to start DVT ppx vs hep gtt once hgb stable   -LLE limb  ischemia s/p thrombectomy and subsequent I&D infected hematoma/VAC placement; VVS would like to start plavix  once hgb stable Afib - anticoag was held due to GIB.  Aortic stenosis HTN HLD   Almarie GORMAN Pringle 05/10/2024

## 2024-05-10 NOTE — TOC Progression Note (Signed)
 Transition of Care [facility]) - Progression Note    Patient Details  Name: Kyle Kelley MRN: [id number] Date of Birth: 1946/04/22  [facility]) CM/SW Contact  Corean JAYSON Canary, RN Phone Number: 05/10/2024, 10:22 AM  Clinical Narrative:     Patient is active with [facility]  Expected Discharge Plan: Home w Home Health Services                 Expected Discharge Plan and Services   Discharge Planning Services: CM Consult Post Acute Care Choice: Home Health, Resumption of Svcs/PTA Provider, Durable Medical Equipment Living arrangements for the past 2 months: Single Family Home                 DME Arranged: Vac DME Agency: [facility] Date DME Agency Contacted: 04/29/24   Representative spoke with at DME Agency: [facility] HH Arranged: RN [facility] Agency: [facility] Date HH Agency Contacted: 04/30/24 Time HH Agency Contacted: 1350 Representative spoke with at [facility] Agency: [place]   Social Drivers of Health (SDOH) Interventions SDOH Screenings   Food Insecurity: No Food Insecurity (05/03/2024)  Housing: Unknown (05/03/2024)  Transportation Needs: No Transportation Needs (05/03/2024)  Utilities: Not At Risk (05/03/2024)  Social Connections: Moderately Integrated (05/03/2024)  Tobacco Use: Medium Risk (05/07/2024)    Readmission Risk Interventions    05/06/2024   12:43 PM  Readmission Risk Prevention Plan  Transportation Screening Complete  PCP or Specialist Appt within 3-5 Days Complete  HRI or Home Care Consult Complete  Social Work Consult for Recovery Care Planning/Counseling Complete  Palliative Care Screening Complete  Medication Review Oceanographer) Referral to Pharmacy

## 2024-05-11 DIAGNOSIS — K251 Acute gastric ulcer with perforation: Secondary | ICD-10-CM | POA: Diagnosis not present

## 2024-05-11 DIAGNOSIS — I10 Essential (primary) hypertension: Secondary | ICD-10-CM | POA: Diagnosis not present

## 2024-05-11 DIAGNOSIS — T8149XA Infection following a procedure, other surgical site, initial encounter: Secondary | ICD-10-CM | POA: Diagnosis not present

## 2024-05-11 DIAGNOSIS — I70222 Atherosclerosis of native arteries of extremities with rest pain, left leg: Secondary | ICD-10-CM | POA: Diagnosis not present

## 2024-05-11 DIAGNOSIS — R42 Dizziness and giddiness: Secondary | ICD-10-CM | POA: Diagnosis not present

## 2024-05-11 DIAGNOSIS — D649 Anemia, unspecified: Secondary | ICD-10-CM | POA: Diagnosis not present

## 2024-05-11 LAB — BASIC METABOLIC PANEL WITH GFR
Anion gap: 9 (ref 5–15)
BUN: 18 mg/dL (ref 8–23)
CO2: 31 mmol/L (ref 22–32)
Calcium: 7.8 mg/dL — ABNORMAL LOW (ref 8.9–10.3)
Chloride: 99 mmol/L (ref 98–111)
Creatinine, Ser: 0.51 mg/dL — ABNORMAL LOW (ref 0.61–1.24)
GFR, Estimated: >60 mL/min (ref >60)
Glucose, Bld: 130 mg/dL — ABNORMAL HIGH (ref 70–99)
Potassium: 3.6 mmol/L (ref 3.5–5.1)
Sodium: 139 mmol/L (ref 135–145)

## 2024-05-11 LAB — GLUCOSE, CAPILLARY
Glucose-Capillary: 132 mg/dL — ABNORMAL HIGH (ref 70–99)
Glucose-Capillary: 136 mg/dL — ABNORMAL HIGH (ref 70–99)
Glucose-Capillary: 124 mg/dL — ABNORMAL HIGH (ref 70–99)

## 2024-05-11 LAB — CBC
HCT: 29.3 % — ABNORMAL LOW (ref 39.0–52.0)
Hemoglobin: 9.1 g/dL — ABNORMAL LOW (ref 13.0–17.0)
MCH: 27.7 pg (ref 26.0–34.0)
MCHC: 31.1 g/dL (ref 30.0–36.0)
MCV: 89.1 fL (ref 80.0–100.0)
Platelets: 150 K/uL (ref 150–400)
RBC: 3.29 MIL/uL — ABNORMAL LOW (ref 4.22–5.81)
RDW: 17.4 % — ABNORMAL HIGH (ref 11.5–15.5)
WBC: 13.4 K/uL — ABNORMAL HIGH (ref 4.0–10.5)
nRBC: 0.4 % — ABNORMAL HIGH (ref 0.0–0.2)

## 2024-05-11 LAB — MAGNESIUM: Magnesium: 2 mg/dL (ref 1.7–2.4)

## 2024-05-11 LAB — PHOSPHORUS: Phosphorus: 2.3 mg/dL — ABNORMAL LOW (ref 2.5–4.6)

## 2024-05-11 MED ORDER — TRACE MINERALS CU-MN-SE-ZN 300-55-60-3000 MCG/ML IV SOLN
INTRAVENOUS | Status: AC
  Filled 2024-05-11: qty 696.8

## 2024-05-11 MED ORDER — SODIUM CHLORIDE 0.9 % IV SOLN
30.0000 mmol | Freq: Once | INTRAVENOUS | Status: AC
  Administered 2024-05-11: 30 mmol via INTRAVENOUS
  Filled 2024-05-11: qty 10

## 2024-05-11 NOTE — Progress Notes (Signed)
 [facility]  PROGRESS NOTE  [name] FMW:[id number] DOB: [birth year] DOA: 05/03/2024 PCP: Charlott Dorn LABOR, MD   Brief HPI:   78 year old male with extensive history of atrial fibrillation (on Eliquis /recently held), vasculopath, with acute on chronic ischemic left lower extremity nonhealing wound, HTN, HLD, Aortic stenosis, CVA, prostate cancer s/p prostatectomy 2014 who presented on 12/5 to the vascular team for follow-up left lower extremity wound.    He presented 03/25/2024 for acute left limb ischemia, vascular was consulted and underwent left lower extremity thromboembolectomy via common femoral and below knee popliteal artery exposure incisions using 3 and 4 fogarty balloons, left tibioperoneal trunk endarterectomy with vein patch angioplasty.  Harvest L greater saphenous vein.  Stent L SFA.   In outpatient follow up, 04/29/2024 worsening wound dehiscence was noted.  He was brought back 05/03/2024 for debridement in OR and placement of wound VAC.   Patient Eliquis  has been held since 04/29/2024.   Labs on 12/5 revealed hemoglobin of 6.2.  Patient was admitted for evaluation of the anemia and was transfused.  Gastroenterology was consulted.  Patient underwent EGD on 12/6.   Patient had persistent abdominal pain after the procedure.  On 12/7, he developed lactic acidosis and leukocytosis.  Responded to fluids, lactic acid trended down.  Patient denied severe symptoms.  On 12/8, patient noted to have worsening leukocytosis.  CT C/A/P ordered and revealed free air, likely due to perforated duodenal ulcer.     Surgery was consulted, he's now s/p ex lap/graham patch.       Assessment/Plan:   Perforated duodenal ulcer Duodenal ulcer was noted on EGD on 12/7 CT abdomen/pelvis on 12/8 showed small volume pneumoperitoneum, in right upper quadrant with focus of free air appearing to track to proximal duodenum suspicious for profound duodenal ulcer General surgery was consulted;  underwent explorative laparotomy, Arlyss patch repair of perforated duodenal ulcer on 12/9 General Surgery following, plan for upper GI, POD #5, on 12/14 to evaluate for leak Continue empiric Zosyn  Continue TNA  Critical limb ischemia/left lower extremity Came to ED for acute limb ischemia on 03/25/2024 Underwent left lower extremity thromboembolectomy, subsequent I&D of infected hematoma/VAC placement Plan for wound VAC change tomorrow at bedside Eliquis  has been held since 04/29/2024 Plavix  to resume once okay with general surgery, ultimately will require Eliquis  for atrial fibrillation  GI bleed Labs on 12/5 revealed hemoglobin of 6.2 GI was consulted; underwent EGD on 12/6  Revealed duodenal ulcer and gastritis PPI twice daily for 8 weeks; Eliquis  on hold due to above Anemia workup showed iron 21, saturation 5%, ferritin 60 B12 907, folate 18 Hemoglobin stable at 9.1  Acute hypoxemic respiratory failure Not on oxygen at baseline Chest x-ray from 12/9 showed bilateral pleural effusions CT on 12/8 showed moderate bilateral effusion, diffuse body wall edema Diuresed well with IV Lasix .  Continue to monitor and wean off oxygen as tolerated  Bilateral lower extremity edema/acute on chronic HFpEF Albumin  is low at 1.8 Anasarca from hypoalbuminemia Venous duplex of lower extremities negative for DVT Continue TPN Echocardiogram from 10/25 showed preserved EF; BNP elevated 351.1 Diuresed well with IV Lasix  given yesterday.  Will give additional dose of Lasix  20 mg IV x 1.   Discontinued Foley; Placed condom cath  Lactic acidosis Resolved Likely in setting of overdiuresis causing possible sepsis  Atrial fibrillation Heart rate is controlled Continue scheduled metoprolol  5 mg IV every 6 hours Eliquis  on hold due to above  Hypokalemia Replete  Hyponatremia. Mild Resolved  Essential hypertension, benign Blood pressure is stable Continue scheduled metoprolol   History of  stroke without residual deficits Stable Eliquis  on hold   Nodular Hepatic Contour  labs with preserved synthetic function    Aortic stenosis Last echo 03/18/2024-reviewed EF 55-60%, normal LV function,  mildly enlarged right ventricle, mildly reduced RV function, aortic valve was not well visualized no significant stenosis was reported  Per cardiology, recommend repeat echocardiogram in the next 1 to 2 months as outpatient  for high-quality imaging to reassess severity of aortic valve stenosis.   ICH (intracerebral hemorrhage) (HCC) Remote history of intracranial hemorrhage Noted   Mixed hyperlipidemia Crestor  on hold   Stage II sacral decubitus ulcer Present on admission. Continue wound care       DVT prophylaxis: SCDs  Medications     Chlorhexidine  Gluconate Cloth  6 each Topical Daily   insulin  aspart  0-9 Units Subcutaneous Q6H   liver oil-zinc  oxide   Topical BID   methocarbamol  (ROBAXIN ) injection  500 mg Intravenous Q8H   metoprolol  tartrate  5 mg Intravenous Q6H   pantoprazole  (PROTONIX ) IV  40 mg Intravenous Q12H   sodium chloride  flush  10-40 mL Intracatheter Q12H   sodium chloride  flush  3 mL Intravenous Q12H   sodium chloride  flush  3 mL Intravenous Q12H     Data Reviewed:   CBG:  Recent Labs  Lab 05/10/24 0609 05/10/24 1109 05/10/24 1633 05/10/24 2323 05/11/24 0548  GLUCAP 144* 146* 151* 149* 132*    SpO2: 98 % O2 Flow Rate (L/min): 2 L/min    Vitals:   05/10/24 2307 05/11/24 0400 05/11/24 0456 05/11/24 0719  BP:  120/75  115/67  Pulse: 95 (!) 105  99  Resp: 16 13  19   Temp:  98.4 F (36.9 C)  97.6 F (36.4 C)  TempSrc:  Oral  Oral  SpO2: 96% 95%  98%  Weight:   81.3 kg   Height:          Data Reviewed:  Basic Metabolic Panel: Recent Labs  Lab 05/07/24 0437 05/08/24 0500 05/09/24 0420 05/10/24 0610 05/11/24 0622  NA 130* 133* 135 134* 139  K 4.7 4.4 3.6 3.4* 3.6  CL 100 100 98 96* 99  CO2 16* 25 34* 31 31   GLUCOSE 110* 149* 126* 139* 130*  BUN 30* 22 18 17 18   CREATININE 0.85 0.84 0.48* 0.46* 0.51*  CALCIUM  7.8* 7.7* 7.4* 7.3* 7.8*  MG 2.2 2.0 1.9 1.8 2.0  PHOS 3.2 2.8 2.1* 2.0* 2.3*    CBC: Recent Labs  Lab 05/07/24 0437 05/08/24 0500 05/08/24 1410 05/09/24 0420 05/10/24 0610 05/11/24 0622  WBC 29.8* 19.2*  --  20.5* 14.6* 13.4*  HGB 10.2* 9.1* 9.4* 8.5* 9.1* 9.1*  HCT 31.6* 29.3* 29.6* 27.7* 28.7* 29.3*  MCV 88.8 91.8  --  91.4 91.1 89.1  PLT 225 173  --  162 142* 150    LFT Recent Labs  Lab 05/05/24 0606 05/06/24 0419 05/07/24 0437 05/08/24 1410 05/09/24 0420  AST 29 21 23   --  29  ALT 21 19 20   --  22  ALKPHOS 49 47 49  --  46  BILITOT 0.6 0.6 0.8  --  0.6  PROT 5.3* 4.9* 5.2*  --  4.7*  ALBUMIN  2.1* 1.8* 1.7* 1.8* 1.6*     Antibiotics: Anti-infectives (From admission, onward)    Start     Dose/Rate Route Frequency Ordered Stop   05/07/24 1930  micafungin  (MYCAMINE ) 100 mg  in sodium chloride  0.9 % 100 mL IVPB        100 mg 105 mL/hr over 1 Hours Intravenous Every 24 hours 05/07/24 1832 05/12/24 2159   05/07/24 0000  piperacillin -tazobactam (ZOSYN ) IVPB 3.375 g        3.375 g 12.5 mL/hr over 240 Minutes Intravenous Every 8 hours 05/06/24 2241     05/03/24 0842  ceFAZolin  (ANCEF ) IVPB 2g/100 mL premix        2 g 200 mL/hr over 30 Minutes Intravenous 30 min pre-op 05/03/24 9157 05/03/24 1157        CONSULTS vascular surgery  Code Status: Full code  Family Communication: No family at bedside     Subjective   Denies shortness of breath.  Diuresing well with IV Lasix    Objective    Physical Examination:  Appears in no acute distress S1-S2, regular, no murmur auscultated Abdomen is soft, nontender, no organomegaly Extremities bilateral 3+ edema    Wound 05/03/24 1900 Pressure Injury Sacrum Mid Stage 2 -  Partial thickness loss of dermis presenting as a shallow open injury with a red, pink wound bed without slough. (Active)         Sabas GORMAN Brod   [facility] If 7PM-7AM, please contact night-coverage at [email or web address], Office  [phone]   05/11/2024, 9:44 AM  LOS: 8 days

## 2024-05-11 NOTE — Plan of Care (Incomplete)

## 2024-05-11 NOTE — Progress Notes (Signed)
° ° °  Assessment & Plan: POD#4 - s/p perforated duodenal ulcer with abscess 12/09 by Dr. Vernetta -s/p EGD 12/6 for UGIB with findings of gastritis and duodenal ulcer, pneumoperitoneum on CT 12/8 -continue NG, NPO -Plan upper GI POD#5 (12/14) - ordered -TNA -IV Zosyn  -wet to dry dressing changes to midline wound   -LLE limb ischemia s/p thrombectomy and subsequent I&D infected hematoma/VAC placement; VVS would like to start plavix  once hgb stable  Afib - anticoag was held due to GIB.  Aortic stenosis HTN HLD       Krystal Spinner, MD [facility] practice Office: [phone]        Chief Complaint: Perforated ulcer  Subjective: Patient in bed, comfortable, no complaints, nurse at bedside  Objective: Vital signs in last 24 hours: Temp:  [97.6 F (36.4 C)-98.5 F (36.9 C)] 97.6 F (36.4 C) (12/13 0719) Pulse Rate:  [95-121] 99 (12/13 0719) Resp:  [13-19] 19 (12/13 0719) BP: (94-126)/(60-78) 115/67 (12/13 0719) SpO2:  [95 %-98 %] 98 % (12/13 0719) Weight:  [81.3 kg] 81.3 kg (12/13 0456) Last BM Date : 05/02/24  Intake/Output from previous day: 12/12 0701 - 12/13 0700 In: 1969.7 [I.V.:994.7; IV Piggyback:975] Out: 1500 [Urine:1250; Emesis/NG output:50; Drains:200] Intake/Output this shift: Total I/O In: -  Out: 75 [Urine:75]  Physical Exam: HEENT - sclerae clear, mucous membranes moist Abdomen - soft, mild distension; dressing dry and intact; drain with thin serosanguinous; NG bilious  Lab Results:  Recent Labs    05/10/24 0610 05/11/24 0622  WBC 14.6* 13.4*  HGB 9.1* 9.1*  HCT 28.7* 29.3*  PLT 142* 150   BMET Recent Labs    05/10/24 0610 05/11/24 0622  NA 134* 139  K 3.4* 3.6  CL 96* 99  CO2 31 31  GLUCOSE 139* 130*  BUN 17 18  CREATININE 0.46* 0.51*  CALCIUM  7.3* 7.8*   PT/INR No results for input(s): LABPROT, INR in the last 72 hours. Comprehensive Metabolic Panel:    Component Value Date/Time   NA 139  05/11/2024 0622   NA 134 (L) 05/10/2024 0610   K 3.6 05/11/2024 0622   K 3.4 (L) 05/10/2024 0610   CL 99 05/11/2024 0622   CL 96 (L) 05/10/2024 0610   CO2 31 05/11/2024 0622   CO2 31 05/10/2024 0610   BUN 18 05/11/2024 0622   BUN 17 05/10/2024 0610   CREATININE 0.51 (L) 05/11/2024 0622   CREATININE 0.46 (L) 05/10/2024 0610   GLUCOSE 130 (H) 05/11/2024 0622   GLUCOSE 139 (H) 05/10/2024 0610   CALCIUM  7.8 (L) 05/11/2024 0622   CALCIUM  7.3 (L) 05/10/2024 0610   AST 29 05/09/2024 0420   AST 23 05/07/2024 0437   ALT 22 05/09/2024 0420   ALT 20 05/07/2024 0437   ALKPHOS 46 05/09/2024 0420   ALKPHOS 49 05/07/2024 0437   BILITOT 0.6 05/09/2024 0420   BILITOT 0.8 05/07/2024 0437   BILITOT 0.5 02/01/2022 1110   BILITOT 0.6 11/09/2021 1104   PROT 4.7 (L) 05/09/2024 0420   PROT 5.2 (L) 05/07/2024 0437   PROT 7.6 02/01/2022 1110   PROT 7.2 11/09/2021 1104   ALBUMIN  1.6 (L) 05/09/2024 0420   ALBUMIN  1.8 (L) 05/08/2024 1410   ALBUMIN  4.5 02/01/2022 1110   ALBUMIN  4.3 11/09/2021 1104    Studies/Results: No results found.    Krystal Spinner 05/11/2024  Patient ID: Kyle Kelley, male   DOB: [birth year], 78 y.o.   MRN: [id number]

## 2024-05-12 ENCOUNTER — Inpatient Hospital Stay (HOSPITAL_COMMUNITY)

## 2024-05-12 LAB — CULTURE, BLOOD (ROUTINE X 2)
Culture: NO GROWTH
Culture: NO GROWTH
Special Requests: ADEQUATE
Special Requests: ADEQUATE

## 2024-05-12 LAB — MAGNESIUM: Magnesium: 2.1 mg/dL (ref 1.7–2.4)

## 2024-05-12 LAB — CBC
HCT: 28.1 % — ABNORMAL LOW (ref 39.0–52.0)
Hemoglobin: 8.7 g/dL — ABNORMAL LOW (ref 13.0–17.0)
MCH: 28.3 pg (ref 26.0–34.0)
MCHC: 31 g/dL (ref 30.0–36.0)
MCV: 91.5 fL (ref 80.0–100.0)
Platelets: 174 K/uL (ref 150–400)
RBC: 3.07 MIL/uL — ABNORMAL LOW (ref 4.22–5.81)
RDW: 17.6 % — ABNORMAL HIGH (ref 11.5–15.5)
WBC: 12.3 K/uL — ABNORMAL HIGH (ref 4.0–10.5)
nRBC: 0 % (ref 0.0–0.2)

## 2024-05-12 LAB — BASIC METABOLIC PANEL WITH GFR
Anion gap: 7 (ref 5–15)
BUN: 18 mg/dL (ref 8–23)
CO2: 30 mmol/L (ref 22–32)
Calcium: 7.5 mg/dL — ABNORMAL LOW (ref 8.9–10.3)
Chloride: 100 mmol/L (ref 98–111)
Creatinine, Ser: 0.41 mg/dL — ABNORMAL LOW (ref 0.61–1.24)
GFR, Estimated: >60 mL/min (ref >60)
Glucose, Bld: 125 mg/dL — ABNORMAL HIGH (ref 70–99)
Potassium: 3.9 mmol/L (ref 3.5–5.1)
Sodium: 137 mmol/L (ref 135–145)

## 2024-05-12 LAB — PHOSPHORUS: Phosphorus: 2.5 mg/dL (ref 2.5–4.6)

## 2024-05-12 LAB — GLUCOSE, CAPILLARY
Glucose-Capillary: 126 mg/dL — ABNORMAL HIGH (ref 70–99)
Glucose-Capillary: 126 mg/dL — ABNORMAL HIGH (ref 70–99)
Glucose-Capillary: 133 mg/dL — ABNORMAL HIGH (ref 70–99)
Glucose-Capillary: 137 mg/dL — ABNORMAL HIGH (ref 70–99)
Glucose-Capillary: 141 mg/dL — ABNORMAL HIGH (ref 70–99)

## 2024-05-12 MED ORDER — TRACE MINERALS CU-MN-SE-ZN 300-55-60-3000 MCG/ML IV SOLN
INTRAVENOUS | Status: AC
  Filled 2024-05-12: qty 696.8

## 2024-05-12 MED ORDER — SODIUM CHLORIDE 0.9 % IV SOLN
15.0000 mmol | Freq: Once | INTRAVENOUS | Status: AC
  Administered 2024-05-12: 15 mmol via INTRAVENOUS
  Filled 2024-05-12: qty 5

## 2024-05-12 MED ORDER — IOHEXOL 300 MG/ML  SOLN
100.0000 mL | Freq: Once | INTRAMUSCULAR | Status: AC | PRN
  Administered 2024-05-12: 100 mL

## 2024-05-12 NOTE — Plan of Care (Signed)
  Problem: Clinical Measurements: Goal: Will remain free from infection Outcome: Progressing Goal: Diagnostic test results will improve Outcome: Progressing Goal: Respiratory complications will improve Outcome: Progressing Goal: Cardiovascular complication will be avoided Outcome: Progressing   Problem: Activity: Goal: Risk for activity intolerance will decrease Outcome: Progressing   Problem: Coping: Goal: Level of anxiety will decrease Outcome: Progressing   Problem: Elimination: Goal: Will not experience complications related to urinary retention Outcome: Progressing   Problem: Pain Managment: Goal: General experience of comfort will improve and/or be controlled Outcome: Progressing

## 2024-05-12 NOTE — Progress Notes (Addendum)
 Progress Note    Kyle Kelley  FMW:[id number] DOB: [birth year]  DOA: 05/03/2024 PCP: Charlott Dorn LABOR, MD      Brief Narrative:    Medical records reviewed and are as summarized below:  Kyle Kelley is a 78 y.o. male extensive history of atrial fibrillation (on Eliquis /recently held), vasculopath, with acute on chronic ischemic left lower extremity nonhealing wound, HTN, HLD, Aortic stenosis, CVA, prostate cancer s/p prostatectomy 2014 who presented on 12/5 to the vascular team for follow-up left lower extremity wound.    He presented 03/25/2024 for acute left limb ischemia, vascular was consulted and underwent left lower extremity thromboembolectomy via common femoral and below knee popliteal artery exposure incisions using 3 and 4 fogarty balloons, left tibioperoneal trunk endarterectomy with vein patch angioplasty.  Harvest L greater saphenous vein.  Stent L SFA.   In outpatient follow up, 04/29/2024 worsening wound dehiscence was noted.  He was brought back 05/03/2024 for debridement in OR and placement of wound VAC.   Patient Eliquis  has been held since 04/29/2024.   Labs on 12/5 revealed hemoglobin of 6.2.  Patient was admitted for evaluation of the anemia and was transfused.  Gastroenterology was consulted.  Patient underwent EGD on 12/6.   Patient had persistent abdominal pain after the procedure.  On 12/7, he developed lactic acidosis and leukocytosis.  Responded to fluids, lactic acid trended down.  Patient denied severe symptoms.  On 12/8, patient noted to have worsening leukocytosis.  CT C/A/P ordered and revealed free air, likely due to perforated duodenal ulcer.     Surgery was consulted, he's now s/p ex lap/graham patch.       Assessment/Plan:   Principal Problem:   Symptomatic anemia Active Problems:   Dizziness   Atrial fibrillation (HCC)   ICH (intracerebral hemorrhage) (HCC)   Critical limb ischemia of left lower extremity (HCC)   Essential  hypertension, benign   Aortic stenosis   Mixed hyperlipidemia   History of stroke without residual deficits   Nutrition Problem: Inadequate oral intake Etiology: inability to eat  Signs/Symptoms: NPO status   Body mass index is 24.14 kg/m.    Perforated duodenal ulcer, acute GI bleed,: This was noted on EGD on 05/04/2024 Found to have pneumoperitoneum in right upper quadrant with focus of free air on CT abdomen and pelvis. S/p ex lap abdominal, Graham patch repair of perforated duodenal ulcer on 12/06/2023 Upper GI series on 05/12/2024 did not show any evidence of leak. He is on TPN He has been started on clear liquid diet.  Follow-up with general surgeon. Continue IV Zosyn . Continue Protonix  twice daily for 8 weeks for total of nausea and gastritis following EGD.   Acute blood loss anemia: Hemoglobin stable.  S/p transfusion with 2 units of PRBCs on 05/03/2024.   Acute on chronic HFpEF, bilateral lower extremity edema, moderate bilateral pleural effusion, anasarca: S/p treatment with IV Lasix  2D echo in October 25 showed preserved EF. Venous duplex of the lower EXTR negative for DVT  History of aortic stenosis, 2D echo on 03/18/2024 showed EF estimated at 55 to 60%, aortic valve was not well visualized and moved significant stenosis was reported. Cardiologist recommended repeating echo in 1 to 2 months as an outpatient   Acute hypoxic respiratory failure: Wean off oxygen as able.   Hypoalbuminemia: Likely from acute illness and poor oral intake.   Atrial fibrillation: Heart rate controlled.  IV metoprolol  as needed Eliquis  on hold because of GI bleed  PVD with acute left lower extremity limb ischemia in October 2025: S/p left lower extremity thromboembolectomy on 03/25/2024. S/p wound debridement of left leg wound.  Continue wound VAC therapy. Vascular surgeon plans to start Plavix  once hemoglobin is stable Follow-up with vascular surgeon   Hypokalemia:  Improved Hyponatremia: Improved   Stage III sacral decubitus ulcer: Present on admission.  Continue local wound care  Comorbidities include hypertension, hyperlipidemia, nodular hepatic contour, history of stroke, remote history of intracranial bleed         Diet Order             Diet clear liquid Room service appropriate? Yes; Fluid consistency: Thin  Diet effective now                                  Consultants: General Surgeon Cardiologist Gastroenterologist  Procedures: EGD 05/04/2024 Excisional debridement left below-knee postsurgical wound on 05/03/2024 Exploratory laparotomy, Arlyss patch repair of perforated duodenal ulcer on 05/07/2024    Medications:    Chlorhexidine  Gluconate Cloth  6 each Topical Daily   insulin  aspart  0-9 Units Subcutaneous Q6H   liver oil-zinc  oxide   Topical BID   methocarbamol  (ROBAXIN ) injection  500 mg Intravenous Q8H   metoprolol  tartrate  5 mg Intravenous Q6H   pantoprazole  (PROTONIX ) IV  40 mg Intravenous Q12H   sodium chloride  flush  10-40 mL Intracatheter Q12H   sodium chloride  flush  3 mL Intravenous Q12H   sodium chloride  flush  3 mL Intravenous Q12H   Continuous Infusions:  piperacillin -tazobactam (ZOSYN )  IV 3.375 g (05/12/24 0828)   potassium PHOSPHATE IVPB (in mmol) 15 mmol (05/12/24 0831)   TPN ADULT (ION) 65 mL/hr at 05/11/24 1745   TPN ADULT (ION)       Anti-infectives (From admission, onward)    Start     Dose/Rate Route Frequency Ordered Stop   05/07/24 1930  micafungin  (MYCAMINE ) 100 mg in sodium chloride  0.9 % 100 mL IVPB        100 mg 105 mL/hr over 1 Hours Intravenous Every 24 hours 05/07/24 1832 05/12/24 0536   05/07/24 0000  piperacillin -tazobactam (ZOSYN ) IVPB 3.375 g        3.375 g 12.5 mL/hr over 240 Minutes Intravenous Every 8 hours 05/06/24 2241     05/03/24 0842  ceFAZolin  (ANCEF ) IVPB 2g/100 mL premix        2 g 200 mL/hr over 30 Minutes Intravenous 30 min pre-op  05/03/24 9157 05/03/24 1157              Family Communication/Anticipated D/C date and plan/Code Status   DVT prophylaxis: Place and maintain sequential compression device Start: 05/09/24 1216 SCDs Start: 05/03/24 1250     Code Status: Full Code  Family Communication: Plan discussed with his wife at the bedside Disposition Plan: Possible discharge to SNF   Status is: Inpatient Remains inpatient appropriate because: Perforated duodenal ulcer s/p laparotomy and Arlyss patch repair       Subjective:   Interval events noted.  He complains of sore abdomen.  No bowel movement because he has not eaten for days.  Wife at the bedside.  Objective:    Vitals:   05/12/24 0532 05/12/24 0534 05/12/24 0747 05/12/24 1111  BP: 112/72  124/67   Pulse: (!) 108     Resp: 20     Temp: (!) 97.4 F (36.3 C)  97.6 F (36.4 C) 97.6  F (36.4 C)  TempSrc: Oral  Oral Oral  SpO2: 94%     Weight:  83 kg    Height:       No data found.   Intake/Output Summary (Last 24 hours) at 05/12/2024 1305 Last data filed at 05/12/2024 0537 Gross per 24 hour  Intake 256.05 ml  Output 1045 ml  Net -788.95 ml   Filed Weights   05/10/24 0600 05/11/24 0456 05/12/24 0534  Weight: 81 kg 81.3 kg 83 kg    Exam:  GEN: NAD SKIN: Warm and dry EYES: No pallor or icterus ENT: MMM, NG tube draining dark fluid CV: RRR PULM: CTA B ABD: soft, mild distention, mild upper abdominal tenderness, +BS, dressing abdominal wound is clean, dry and intact CNS: AAO x 3, non focal EXT: B/l leg edema, no tenderness.  Wound VAC connected to the left leg wound.      Data Reviewed:   I have personally reviewed following labs and imaging studies:  Labs: Labs show the following:   Basic Metabolic Panel: Recent Labs  Lab 05/08/24 0500 05/09/24 0420 05/10/24 0610 05/11/24 0622 05/12/24 0552  NA 133* 135 134* 139 137  K 4.4 3.6 3.4* 3.6 3.9  CL 100 98 96* 99 100  CO2 25 34* 31 31 30   GLUCOSE  149* 126* 139* 130* 125*  BUN 22 18 17 18 18   CREATININE 0.84 0.48* 0.46* 0.51* 0.41*  CALCIUM  7.7* 7.4* 7.3* 7.8* 7.5*  MG 2.0 1.9 1.8 2.0 2.1  PHOS 2.8 2.1* 2.0* 2.3* 2.5   GFR Estimated Creatinine Clearance: 88.8 mL/min (A) (by C-G formula based on SCr of 0.41 mg/dL (L)). Liver Function Tests: Recent Labs  Lab 05/06/24 0419 05/07/24 0437 05/08/24 1410 05/09/24 0420  AST 21 23  --  29  ALT 19 20  --  22  ALKPHOS 47 49  --  46  BILITOT 0.6 0.8  --  0.6  PROT 4.9* 5.2*  --  4.7*  ALBUMIN  1.8* 1.7* 1.8* 1.6*   No results for input(s): LIPASE, AMYLASE in the last 168 hours. No results for input(s): AMMONIA in the last 168 hours. Coagulation profile No results for input(s): INR, PROTIME in the last 168 hours.  CBC: Recent Labs  Lab 05/08/24 0500 05/08/24 1410 05/09/24 0420 05/10/24 0610 05/11/24 0622 05/12/24 0552  WBC 19.2*  --  20.5* 14.6* 13.4* 12.3*  HGB 9.1* 9.4* 8.5* 9.1* 9.1* 8.7*  HCT 29.3* 29.6* 27.7* 28.7* 29.3* 28.1*  MCV 91.8  --  91.4 91.1 89.1 91.5  PLT 173  --  162 142* 150 174   Cardiac Enzymes: No results for input(s): CKTOTAL, CKMB, CKMBINDEX, TROPONINI in the last 168 hours. BNP (last 3 results) No results for input(s): PROBNP in the last 8760 hours. CBG: Recent Labs  Lab 05/11/24 1117 05/11/24 1637 05/12/24 0010 05/12/24 0535 05/12/24 1108  GLUCAP 136* 124* 141* 126* 126*   D-Dimer: No results for input(s): DDIMER in the last 72 hours. Hgb A1c: No results for input(s): HGBA1C in the last 72 hours. Lipid Profile: No results for input(s): CHOL, HDL, LDLCALC, TRIG, CHOLHDL, LDLDIRECT in the last 72 hours. Thyroid  function studies: No results for input(s): TSH, T4TOTAL, T3FREE, THYROIDAB in the last 72 hours.  Invalid input(s): FREET3 Anemia work up: No results for input(s): VITAMINB12, FOLATE, FERRITIN, TIBC, IRON, RETICCTPCT in the last 72 hours. Sepsis Labs: Recent Labs   Lab 05/05/24 1441 05/05/24 1815 05/05/24 2041 05/05/24 2219 05/06/24 9160 05/06/24 1948 05/09/24 0420 05/10/24  9389 05/11/24 0622 05/12/24 0552  WBC  --   --   --    < >  --    < > 20.5* 14.6* 13.4* 12.3*  LATICACIDVEN 3.7* 3.4* 2.6*  --  1.5  --   --   --   --   --    < > = values in this interval not displayed.    Microbiology Recent Results (from the past 240 hours)  Culture, blood (Routine X 2) w Reflex to ID Panel     Status: None   Collection Time: 05/06/24 11:47 PM   Specimen: BLOOD  Result Value Ref Range Status   Specimen Description BLOOD SITE NOT SPECIFIED  Final   Special Requests   Final    BOTTLES DRAWN AEROBIC AND ANAEROBIC Blood Culture adequate volume   Culture   Final    NO GROWTH 5 DAYS Performed at [facility], [address] [place], [place] [address]    Report Status 05/12/2024 FINAL  Final  Culture, blood (Routine X 2) w Reflex to ID Panel     Status: None   Collection Time: 05/06/24 11:47 PM   Specimen: BLOOD  Result Value Ref Range Status   Specimen Description BLOOD SITE NOT SPECIFIED  Final   Special Requests   Final    BOTTLES DRAWN AEROBIC AND ANAEROBIC Blood Culture adequate volume   Culture   Final    NO GROWTH 5 DAYS Performed at [facility], [address], [place], [place] [address]    Report Status 05/12/2024 FINAL  Final    Procedures and diagnostic studies:  DG UGI W SINGLE CM (SOL OR THIN BA) Result Date: 05/12/2024 CLINICAL DATA:  Patient with history of perforated duodenal ulcer s/p exploratory laparotomy and Graham patch repair 05/07/24. Request for UGI to evaluate for post operative leak prior to removal of NG tube and advancing diet. EXAM: DG UGI W SINGLE CM TECHNIQUE: Scout radiograph was obtained. Single contrast examination was performed using water  soluble contrast. This exam was performed by Clotilda Hesselbach, PA-C, and was supervised and interpreted by Ester Sides, MD. FLUOROSCOPY: Radiation Exposure  Index (as provided by the fluoroscopic device): 5.00 mGy Kerma COMPARISON:  CT chest/abd/pelvis w/o contrast 05/06/24, XR abdomen 1 view 05/07/24. FINDINGS: Scout Radiograph: Multiple leads overlying lower chest. Non specific bowel gas pattern, dilated loops of colon in the left upper and lower quadrants. Right upper quadrant surgical drain. Nasogastric tube with tip in the gastric body. Esophagus: Not evaluated on problem focused exam. Esophageal motility:  Not evaluated on problem focused exam. Stomach: Normal appearance. No hiatal hernia. Gastric emptying: Normal. Duodenum: Normal appearance, no contrast extravasation . Other: Exam performed in prone AP, left oblique, right oblique only due to patient's ability to stand or lay laterally due to pain. Contrast was injected via existing nasogastric tube. IMPRESSION: Water  soluble problem focused UGI without evidence of contrast extravasation. Electronically Signed   By: Ester Sides M.D.   On: 05/12/2024 11:03               LOS: 9 days   Luby Seamans  [facility]   Pager on [email or web address]. If 7PM-7AM, please contact night-coverage at [email or web address]     05/12/2024, 1:05 PM

## 2024-05-12 NOTE — Progress Notes (Signed)
 PHARMACY - TOTAL PARENTERAL NUTRITION CONSULT NOTE  Indication: perforated viscus   Patient Measurements: Height: 6' 1 (185.4 cm) Weight: 83 kg (182 lb 15.7 oz) IBW/kg (Calculated) : 79.9 TPN AdjBW (KG): 77.6 Body mass index is 24.14 kg/m. Usual Weight: ~78 kg (previous weights between 159 - 171 lbs over past 6 months), now up to 81 kg  Assessment:  78 y.o. male with history of prostate cancer s/p prostatectomy and radiation therapy who underwent debridement of left below knee postsurgical wound 12/06.  Found to have severe anemia with a hemoglobin of 6.2 post-op. Patient states that for the past 3 months he has noticed that stools have been unusually dark, black, usually 1 to 2/day.   GI was consulted 12/06 due to persistent abdominal pain post-procedure prompting CT scan. EGD was notable for non-bleeding ulcer in the duodenal bulb. CT with small volume pneumoperitoneum tracking near the duodenum. Surgery recommends bowel rest with NGTS, TPN for nutrtion while NPO.  Glucose / Insulin : no hx DM, A1c 5.7% in 02/2024 - CBGs < 180 Used 4 units SSI in the past 24 hrs Electrolytes: K 3.9 and Phos at 2.5 post KPhos , others WNL Renal: SCr < 1, BUN WNL - Lasix  daily x3 days, last dose 12/12 Hepatic: LFTs / tbili WNL, albumin  1.6.  TG 32 on 03/27/24. Intake / Output; MIVF:  UOP 0.5 mL/kg/hr, NG 0mL, drain , LBM 12/4, net +1.9L GI Imaging: 12/8 CT: suspicious for perf duodenal ulcer, possible hemoperitoneum GI Surgeries / Procedures: none since start of TPN  Central access: 05/07/24 TPN start date:  05/07/24  Nutritional Goals: Goal TPN rate is 65 mL/hr (provides 105g AA and 2103 kCal per day)  RD Estimated Needs Total Energy Estimated Needs: 2100-2300 kcals Total Protein Estimated Needs: 100-115g Total Fluid Estimated Needs: >2L/day  Current Nutrition:  TPN  Plan:  Continue concentrated TPN at 65 ml/hr to meet 100% of needs Electrolytes in TPN: tweak Na to 90 mEq/L and K  to 65 mEq/L, Ca 5 mEq/L, Mg 10 mEq/L, push Phos to 30 mmol/L, max CL Add standard MVI and trace elements to TPN Add thiamine  100 mg/day to TPN x5 days total (last dose on 05/14/24) KPhos 15 mmol IV in NS x 1 D/C SSI/CBG checks post today Monitor TPN labs on Mon/Thurs - labs in AM F/u uGI, monitor volume status  Brenya Taulbee D. L[place]ell, PharmD, BCPS, BCCCP 05/12/2024, 7:38 AM

## 2024-05-12 NOTE — Plan of Care (Signed)

## 2024-05-12 NOTE — Progress Notes (Signed)
 Assessment & Plan: POD#5 - s/p perforated duodenal ulcer with abscess 12/09 by Dr. Vernetta -s/p EGD 12/6 for UGIB with findings of gastritis and duodenal ulcer, pneumoperitoneum on CT 12/8 -continue NG, NPO pending UGIS this AM -upper GI series scheduled for this AM - if no leak, will discontinue NG and start CLD -TNA -IV Zosyn  -wet to dry dressing changes to midline wound   -LLE limb ischemia s/p thrombectomy and subsequent I&D infected hematoma/VAC placement; VVS would like to start plavix  once hgb stable   Afib - anticoag was held due to GIB.  Aortic stenosis HTN HLD        Krystal Spinner, MD [facility] practice Office: [phone]        Chief Complaint: Perforated ulcer  Subjective: Patient in bed, comfortable, alert.  Wife at bedside.  Objective: Vital signs in last 24 hours: Temp:  [97.4 F (36.3 C)-98.7 F (37.1 C)] 97.6 F (36.4 C) (12/14 0747) Pulse Rate:  [93-109] 108 (12/14 0532) Resp:  [20-25] 20 (12/14 0532) BP: (112-131)/(67-82) 124/67 (12/14 0747) SpO2:  [94 %-96 %] 94 % (12/14 0532) Weight:  [83 kg] 83 kg (12/14 0534) Last BM Date : 05/02/24  Intake/Output from previous day: 12/13 0701 - 12/14 0700 In: 256.1 [I.V.:1; IV Piggyback:255] Out: 1245 [Urine:1000; Drains:245] Intake/Output this shift: No intake/output data recorded.  Physical Exam: HEENT - sclerae clear, mucous membranes moist Neck - soft Abdomen - soft, protuberant; dressing dry and intact; NG with small bilious; JP with thin serous  Lab Results:  Recent Labs    05/11/24 0622 05/12/24 0552  WBC 13.4* 12.3*  HGB 9.1* 8.7*  HCT 29.3* 28.1*  PLT 150 174   BMET Recent Labs    05/11/24 0622 05/12/24 0552  NA 139 137  K 3.6 3.9  CL 99 100  CO2 31 30  GLUCOSE 130* 125*  BUN 18 18  CREATININE 0.51* 0.41*  CALCIUM  7.8* 7.5*   PT/INR No results for input(s): LABPROT, INR in the last 72 hours. Comprehensive Metabolic Panel:     Component Value Date/Time   NA 137 05/12/2024 0552   NA 139 05/11/2024 0622   K 3.9 05/12/2024 0552   K 3.6 05/11/2024 0622   CL 100 05/12/2024 0552   CL 99 05/11/2024 0622   CO2 30 05/12/2024 0552   CO2 31 05/11/2024 0622   BUN 18 05/12/2024 0552   BUN 18 05/11/2024 0622   CREATININE 0.41 (L) 05/12/2024 0552   CREATININE 0.51 (L) 05/11/2024 0622   GLUCOSE 125 (H) 05/12/2024 0552   GLUCOSE 130 (H) 05/11/2024 0622   CALCIUM  7.5 (L) 05/12/2024 0552   CALCIUM  7.8 (L) 05/11/2024 0622   AST 29 05/09/2024 0420   AST 23 05/07/2024 0437   ALT 22 05/09/2024 0420   ALT 20 05/07/2024 0437   ALKPHOS 46 05/09/2024 0420   ALKPHOS 49 05/07/2024 0437   BILITOT 0.6 05/09/2024 0420   BILITOT 0.8 05/07/2024 0437   BILITOT 0.5 02/01/2022 1110   BILITOT 0.6 11/09/2021 1104   PROT 4.7 (L) 05/09/2024 0420   PROT 5.2 (L) 05/07/2024 0437   PROT 7.6 02/01/2022 1110   PROT 7.2 11/09/2021 1104   ALBUMIN  1.6 (L) 05/09/2024 0420   ALBUMIN  1.8 (L) 05/08/2024 1410   ALBUMIN  4.5 02/01/2022 1110   ALBUMIN  4.3 11/09/2021 1104    Studies/Results: No results found.    Krystal Spinner 05/12/2024  Patient ID: Lynwood JULIANNA Pontes, male   DOB: 05-15-1946, 78 y.o.  MRN: [id number]

## 2024-05-13 LAB — COMPREHENSIVE METABOLIC PANEL WITH GFR
ALT: 70 U/L — ABNORMAL HIGH (ref 0–44)
AST: 56 U/L — ABNORMAL HIGH (ref 15–41)
Albumin: <1.5 g/dL — ABNORMAL LOW (ref 3.5–5.0)
Alkaline Phosphatase: 67 U/L (ref 38–126)
Anion gap: 3 — ABNORMAL LOW (ref 5–15)
BUN: 19 mg/dL (ref 8–23)
CO2: 32 mmol/L (ref 22–32)
Calcium: 7.5 mg/dL — ABNORMAL LOW (ref 8.9–10.3)
Chloride: 104 mmol/L (ref 98–111)
Creatinine, Ser: 0.41 mg/dL — ABNORMAL LOW (ref 0.61–1.24)
GFR, Estimated: >60 mL/min (ref >60)
Glucose, Bld: 116 mg/dL — ABNORMAL HIGH (ref 70–99)
Potassium: 4.4 mmol/L (ref 3.5–5.1)
Sodium: 139 mmol/L (ref 135–145)
Total Bilirubin: 0.6 mg/dL (ref 0.0–1.2)
Total Protein: 5 g/dL — ABNORMAL LOW (ref 6.5–8.1)

## 2024-05-13 LAB — CBC
HCT: 28.3 % — ABNORMAL LOW (ref 39.0–52.0)
Hemoglobin: 8.5 g/dL — ABNORMAL LOW (ref 13.0–17.0)
MCH: 27.5 pg (ref 26.0–34.0)
MCHC: 30 g/dL (ref 30.0–36.0)
MCV: 91.6 fL (ref 80.0–100.0)
Platelets: 196 K/uL (ref 150–400)
RBC: 3.09 MIL/uL — ABNORMAL LOW (ref 4.22–5.81)
RDW: 17.9 % — ABNORMAL HIGH (ref 11.5–15.5)
WBC: 12.1 K/uL — ABNORMAL HIGH (ref 4.0–10.5)
nRBC: 0 % (ref 0.0–0.2)

## 2024-05-13 LAB — GLUCOSE, CAPILLARY
Glucose-Capillary: 113 mg/dL — ABNORMAL HIGH (ref 70–99)
Glucose-Capillary: 128 mg/dL — ABNORMAL HIGH (ref 70–99)
Glucose-Capillary: 132 mg/dL — ABNORMAL HIGH (ref 70–99)
Glucose-Capillary: 148 mg/dL — ABNORMAL HIGH (ref 70–99)

## 2024-05-13 LAB — PHOSPHORUS: Phosphorus: 2.8 mg/dL (ref 2.5–4.6)

## 2024-05-13 LAB — MAGNESIUM: Magnesium: 2.1 mg/dL (ref 1.7–2.4)

## 2024-05-13 LAB — TRIGLYCERIDES: Triglycerides: 34 mg/dL (ref <150)

## 2024-05-13 MED ORDER — TRACE MINERALS CU-MN-SE-ZN 300-55-60-3000 MCG/ML IV SOLN
INTRAVENOUS | Status: AC
  Filled 2024-05-13: qty 375.2

## 2024-05-13 MED ORDER — ENSURE PLUS HIGH PROTEIN PO LIQD
237.0000 mL | Freq: Three times a day (TID) | ORAL | Status: DC
  Administered 2024-05-13 – 2024-05-20 (×16): 237 mL via ORAL

## 2024-05-13 MED ORDER — CLOPIDOGREL BISULFATE 75 MG PO TABS
75.0000 mg | ORAL_TABLET | Freq: Every day | ORAL | Status: DC
  Administered 2024-05-13 – 2024-05-20 (×8): 75 mg via ORAL
  Filled 2024-05-13 (×8): qty 1

## 2024-05-13 MED ORDER — METOPROLOL SUCCINATE ER 25 MG PO TB24
25.0000 mg | ORAL_TABLET | Freq: Every day | ORAL | Status: DC
  Administered 2024-05-13 – 2024-05-18 (×6): 25 mg via ORAL
  Filled 2024-05-13 (×7): qty 1

## 2024-05-13 MED ORDER — ADULT MULTIVITAMIN W/MINERALS CH
1.0000 | ORAL_TABLET | Freq: Every day | ORAL | Status: DC
  Administered 2024-05-14 – 2024-05-20 (×7): 1 via ORAL
  Filled 2024-05-13 (×7): qty 1

## 2024-05-13 MED ORDER — ENSURE PLUS HIGH PROTEIN PO LIQD
237.0000 mL | Freq: Two times a day (BID) | ORAL | Status: DC
  Administered 2024-05-13: 12:00:00 237 mL via ORAL

## 2024-05-13 NOTE — Plan of Care (Signed)
°  Problem: Education: Goal: Knowledge of General Education information will improve Description: Including pain rating scale, medication(s)/side effects and non-pharmacologic comfort measures Outcome: Progressing   Problem: Clinical Measurements: Goal: Will remain free from infection Outcome: Progressing Goal: Diagnostic test results will improve Outcome: Progressing Goal: Respiratory complications will improve Outcome: Progressing Goal: Cardiovascular complication will be avoided Outcome: Progressing   Problem: Activity: Goal: Risk for activity intolerance will decrease Outcome: Progressing   Problem: Nutrition: Goal: Adequate nutrition will be maintained Outcome: Progressing   Problem: Coping: Goal: Level of anxiety will decrease Outcome: Progressing   Problem: Pain Managment: Goal: General experience of comfort will improve and/or be controlled Outcome: Progressing   Problem: Safety: Goal: Ability to remain free from injury will improve Outcome: Progressing

## 2024-05-13 NOTE — Progress Notes (Addendum)
 Nutrition Follow-up  DOCUMENTATION CODES:   Severe malnutrition in context of acute illness/injury  INTERVENTION:   Continue TPN at 35 ml/h until patient is consistently consuming at least 75% of meals. Ensure Plus High Protein po TID, each supplement provides 350 kcal and 20 grams of protein. Change MVI to PO route starting tomorrow.  NUTRITION DIAGNOSIS:   Severe Malnutrition related to acute illness as evidenced by moderate fat depletion, moderate muscle depletion, severe muscle depletion; new diagnosis.  GOAL:   Patient will meet greater than or equal to 90% of their needs; met with TPN.  MONITOR:   Diet advancement, Labs, I & O's, Weight trends  REASON FOR ASSESSMENT:   Consult New TPN/TNA  ASSESSMENT:   Pt with PMH significant for: afib, aortic stenosis, CVA, prostate cancer s/p prostatectomy (2014) and radiation therapy, sigmoid diverticulosis, HTN, and dyslipidemia. Has acute on chronic ischemic left lower extremity nonhealing wound. Presented to 12/05 for vascular r/t LLE wound for debridement and wound vac placement. Developed severe anemia (Hgb 6.2) post op and admitted for evaluation and was transfused. GI consulted and he underwent EGD on 12/06. He developed persistent abdominal pain post procedure and suspected pneumoperitoneum 2/2 perforated duodenal ulcer. Emergently taken to OR for exlap.  Patient reports good intake PTA with no known weight loss. He likes Ensure and Boost supplements and agrees to drink them when offered. He has not had any yet. He reports that his appetite is just okay, but he is ready to try solid foods.   12/05 - admitted; clear liquid diet 12/06 - NPO; EGD: non-bleeding duodenal ulcer; adv GI soft diet 12/07 - persistent abd pain post procedure 12/08 - CT A/P: pneumoperitoneum; NPO 12/09 - OR: exlap w/ graham patch repair 2/2 perforated duodenal ulcer; TPN initiated 12/14: Clear liquids 12/15: Soft diet with Ensure Plus High Protein BID;  TPN starting to be weaned  TPN at 65 ml/h is providing 2103 kcal, 105 gm protein daily. TPN rate decreasing to 35 ml/h with new bag today to provide 1132 kcal, 56 gm protein daily.   Labs reviewed.  K 3.4 (L) on 12/12, up to 4.4 today Phos 2.1 (12/11)-->2 (12/12)-->2.3 (12/13)-->2.5 (12/14)-->2.8 today Low K and phos likely r/t refeeding, but both have been repleted and WNL for the past few days. Received thiamine  x 5 days. CBG: 126-133-137-132  Medications reviewed and include protonix , IV zosyn .  I/O +2.2 L since admission UOP 700 ml x 24 h VAC to LLE with no output documented x 24 h R abd drain with 170 ml output x 24 h  Admit weight: 77.6 kg (171 lbs) Current weight: 82.3 kg (181 lbs) Usual weight: 171 lbs per patient, 161 lbs per wife  Patient meets criteria for severe malnutrition, given moderate-severe depletion of muscle and subcutaneous fat mass.  NUTRITION - FOCUSED PHYSICAL EXAM:  Flowsheet Row Most Recent Value  Orbital Region Moderate depletion  Upper Arm Region Mild depletion  Thoracic and Lumbar Region Moderate depletion  Buccal Region Moderate depletion  Temple Region Severe depletion  Clavicle Bone Region Severe depletion  Clavicle and Acromion Bone Region Moderate depletion  Scapular Bone Region Moderate depletion  Dorsal Hand Mild depletion  Patellar Region Mild depletion  Anterior Thigh Region Mild depletion  Posterior Calf Region Unable to assess  [edema]  Edema (RD Assessment) Moderate  [Mod to BLE, Mild to RUE]  Hair Reviewed  Eyes Reviewed  Mouth Reviewed  Skin Reviewed  Nails Reviewed    Diet Order:   Diet  Order             DIET SOFT Room service appropriate? Yes; Fluid consistency: Thin  Diet effective now                   EDUCATION NEEDS:   No education needs have been identified at this time  Skin:  Skin Assessment: Skin Integrity Issues: Skin Integrity Issues:: Stage II Stage II: sacrum  Last BM:  12/04  Height:   Ht  Readings from Last 1 Encounters:  05/04/24 6' 1 (1.854 m)    Weight:   Wt Readings from Last 1 Encounters:  05/13/24 82.3 kg    Ideal Body Weight:  83.6 kg  BMI:  Body mass index is 23.94 kg/m.  Estimated Nutritional Needs:   Kcal:  2100-2300 kcals  Protein:  100-115g  Fluid:  >2L/day   Suzen HUNT RD, LDN, CNSC Contact via secure chat. If unavailable, use group chat RD Inpatient.

## 2024-05-13 NOTE — Progress Notes (Addendum)
 Progress Note    Kyle Kelley  FMW:[id number] DOB: 1945/12/10  DOA: 05/03/2024 PCP: Charlott Dorn LABOR, MD      Brief Narrative:    Medical records reviewed and are as summarized below:  Kyle Kelley is a 78 y.o. male extensive history of atrial fibrillation (on Eliquis /recently held), vasculopath, with acute on chronic ischemic left lower extremity nonhealing wound, HTN, HLD, Aortic stenosis, CVA, prostate cancer s/p prostatectomy 2014 who presented on 12/5 to the vascular team for follow-up left lower extremity wound.    He presented 03/25/2024 for acute left limb ischemia, vascular was consulted and underwent left lower extremity thromboembolectomy via common femoral and below knee popliteal artery exposure incisions using 3 and 4 fogarty balloons, left tibioperoneal trunk endarterectomy with vein patch angioplasty.  Harvest L greater saphenous vein.  Stent L SFA.   In outpatient follow up, 04/29/2024 worsening wound dehiscence was noted.  He was brought back 05/03/2024 for debridement in OR and placement of wound VAC.   Patient Eliquis  has been held since 04/29/2024.   Labs on 12/5 revealed hemoglobin of 6.2.  Patient was admitted for evaluation of the anemia and was transfused.  Gastroenterology was consulted.  Patient underwent EGD on 12/6.   Patient had persistent abdominal pain after the procedure.  On 12/7, he developed lactic acidosis and leukocytosis.  Responded to fluids, lactic acid trended down.  Patient denied severe symptoms.  On 12/8, patient noted to have worsening leukocytosis.  CT C/A/P ordered and revealed free air, likely due to perforated duodenal ulcer.     Surgery was consulted, he's now s/p ex lap/graham patch.       Assessment/Plan:   Principal Problem:   Symptomatic anemia Active Problems:   Dizziness   Atrial fibrillation (HCC)   ICH (intracerebral hemorrhage) (HCC)   Critical limb ischemia of left lower extremity (HCC)   Essential  hypertension, benign   Aortic stenosis   Mixed hyperlipidemia   History of stroke without residual deficits   Nutrition Problem: Inadequate oral intake Etiology: inability to eat  Signs/Symptoms: NPO status   Body mass index is 23.94 kg/m.    Perforated duodenal ulcer, acute GI bleed,: This was noted on EGD on 05/04/2024 Found to have pneumoperitoneum in right upper quadrant with focus of free air on CT abdomen and pelvis. S/p ex lap abdominal, Graham patch repair of perforated duodenal ulcer on 05/07/2024 Upper GI series on 05/12/2024 did not show any evidence of leak. He is on TPN He is tolerating clear liquid diet.  Follow-up with general surgeon. Continue Protonix  twice daily for 8 weeks for total of nausea and gastritis following EGD.   Acute blood loss anemia: Hemoglobin stable between 8-9.4.  S/p transfusion with 2 units of PRBCs on 05/03/2024.   Acute on chronic HFpEF, bilateral lower extremity edema, moderate bilateral pleural effusion, anasarca: S/p treatment with IV Lasix  2D echo in October 25 showed preserved EF. Venous duplex of the lower extremities negative for DVT  History of aortic stenosis, 2D echo on 03/18/2024 showed EF estimated at 55 to 60%, aortic valve was not well visualized and moved significant stenosis was reported. Cardiologist recommended repeating echo in 1 to 2 months as an outpatient   Acute hypoxic respiratory failure: Wean off oxygen as able.   Severe hypoalbuminemia: Likely from acute illness, recent surgery and poor oral intake. Follow-up with dietitian.   Atrial fibrillation: Slightly tachycardic but overall controlled.  Discontinue IV metoprolol  and resume home Toprol -XL. Eliquis   on hold because of GI bleed but will need Eliquis  at discharge if there is no further bleeding   PVD with acute left lower extremity limb ischemia in October 2025: S/p left lower extremity thromboembolectomy on 03/25/2024. S/p wound debridement of left leg  wound.  Continue wound VAC therapy. Plan to start Plavix  when okay with general surgeon. Follow-up with vascular surgeon   Hypokalemia: Improved Hyponatremia: Improved   Stage III sacral decubitus ulcer: Present on admission.  Continue local wound care  Comorbidities include hypertension, hyperlipidemia, nodular hepatic contour, history of stroke, remote history of intracranial bleed         Diet Order             DIET SOFT Room service appropriate? Yes; Fluid consistency: Thin  Diet effective now                                  Consultants: General Surgeon Cardiologist Gastroenterologist Vascular surgeon  Procedures: EGD 05/04/2024 Excisional debridement left below-knee postsurgical wound on 05/03/2024 Exploratory laparotomy, Arlyss patch repair of perforated duodenal ulcer on 05/07/2024    Medications:    Chlorhexidine  Gluconate Cloth  6 each Topical Daily   clopidogrel   75 mg Oral Daily   feeding supplement  237 mL Oral BID BM   liver oil-zinc  oxide   Topical BID   methocarbamol  (ROBAXIN ) injection  500 mg Intravenous Q8H   metoprolol  succinate  25 mg Oral Daily   pantoprazole  (PROTONIX ) IV  40 mg Intravenous Q12H   sodium chloride  flush  10-40 mL Intracatheter Q12H   sodium chloride  flush  3 mL Intravenous Q12H   sodium chloride  flush  3 mL Intravenous Q12H   Continuous Infusions:  piperacillin -tazobactam (ZOSYN )  IV 3.375 g (05/12/24 2325)   TPN ADULT (ION) 65 mL/hr at 05/12/24 1809   TPN ADULT (ION)       Anti-infectives (From admission, onward)    Start     Dose/Rate Route Frequency Ordered Stop   05/07/24 1930  micafungin  (MYCAMINE ) 100 mg in sodium chloride  0.9 % 100 mL IVPB        100 mg 105 mL/hr over 1 Hours Intravenous Every 24 hours 05/07/24 1832 05/12/24 0536   05/07/24 0000  piperacillin -tazobactam (ZOSYN ) IVPB 3.375 g        3.375 g 12.5 mL/hr over 240 Minutes Intravenous Every 8 hours 05/06/24 2241     05/03/24  0842  ceFAZolin  (ANCEF ) IVPB 2g/100 mL premix        2 g 200 mL/hr over 30 Minutes Intravenous 30 min pre-op 05/03/24 9157 05/03/24 1157              Family Communication/Anticipated D/C date and plan/Code Status   DVT prophylaxis: Place and maintain sequential compression device Start: 05/09/24 1216 SCDs Start: 05/03/24 1250     Code Status: Full Code  Family Communication: Plan discussed with his wife at the bedside Disposition Plan: Possible discharge to SNF   Status is: Inpatient Remains inpatient appropriate because: Perforated duodenal ulcer s/p laparotomy and Arlyss patch repair       Subjective:   Interval events noted.  He is feeling better today.  Abdominal still a little sore.  Cough is getting better.  No shortness of breath.  He is tolerating clear liquid diet.   Objective:    Vitals:   05/13/24 0327 05/13/24 0400 05/13/24 0447 05/13/24 0814  BP:  121/69  117/61  Pulse:  92  93  Resp: 14 13  20   Temp:  97.9 F (36.6 C)  98.5 F (36.9 C)  TempSrc:  Oral  Oral  SpO2:  99%  100%  Weight:   82.3 kg   Height:       No data found.   Intake/Output Summary (Last 24 hours) at 05/13/2024 1124 Last data filed at 05/13/2024 1029 Gross per 24 hour  Intake 1229.76 ml  Output 980 ml  Net 249.76 ml   Filed Weights   05/11/24 0456 05/12/24 0534 05/13/24 0447  Weight: 81.3 kg 83 kg 82.3 kg    Exam:  GEN: NAD SKIN: Warm and dry EYES: No pallor or icterus ENT: MMM CV: RRR PULM: CTA B ABD: soft, distended, dressing on surgical wound is clean, dry and intact, NT, +BS CNS: AAO x 3, non focal EXT: Lower extremity edema from the thighs to the feet.  Wound VAC to left leg wound     Data Reviewed:   I have personally reviewed following labs and imaging studies:  Labs: Labs show the following:   Basic Metabolic Panel: Recent Labs  Lab 05/09/24 0420 05/10/24 0610 05/11/24 0622 05/12/24 0552 05/13/24 0500  NA 135 134* 139 137 139   K 3.6 3.4* 3.6 3.9 4.4  CL 98 96* 99 100 104  CO2 34* 31 31 30  32  GLUCOSE 126* 139* 130* 125* 116*  BUN 18 17 18 18 19   CREATININE 0.48* 0.46* 0.51* 0.41* 0.41*  CALCIUM  7.4* 7.3* 7.8* 7.5* 7.5*  MG 1.9 1.8 2.0 2.1 2.1  PHOS 2.1* 2.0* 2.3* 2.5 2.8   GFR Estimated Creatinine Clearance: 88.8 mL/min (A) (by C-G formula based on SCr of 0.41 mg/dL (L)). Liver Function Tests: Recent Labs  Lab 05/07/24 0437 05/08/24 1410 05/09/24 0420 05/13/24 0500  AST 23  --  29 56*  ALT 20  --  22 70*  ALKPHOS 49  --  46 67  BILITOT 0.8  --  0.6 0.6  PROT 5.2*  --  4.7* 5.0*  ALBUMIN  1.7* 1.8* 1.6* <1.5*   No results for input(s): LIPASE, AMYLASE in the last 168 hours. No results for input(s): AMMONIA in the last 168 hours. Coagulation profile No results for input(s): INR, PROTIME in the last 168 hours.  CBC: Recent Labs  Lab 05/09/24 0420 05/10/24 0610 05/11/24 0622 05/12/24 0552 05/13/24 0500  WBC 20.5* 14.6* 13.4* 12.3* 12.1*  HGB 8.5* 9.1* 9.1* 8.7* 8.5*  HCT 27.7* 28.7* 29.3* 28.1* 28.3*  MCV 91.4 91.1 89.1 91.5 91.6  PLT 162 142* 150 174 196   Cardiac Enzymes: No results for input(s): CKTOTAL, CKMB, CKMBINDEX, TROPONINI in the last 168 hours. BNP (last 3 results) No results for input(s): PROBNP in the last 8760 hours. CBG: Recent Labs  Lab 05/12/24 0535 05/12/24 1108 05/12/24 1756 05/12/24 2318 05/13/24 0623  GLUCAP 126* 126* 133* 137* 132*   D-Dimer: No results for input(s): DDIMER in the last 72 hours. Hgb A1c: No results for input(s): HGBA1C in the last 72 hours. Lipid Profile: Recent Labs    05/13/24 0500  TRIG 34   Thyroid  function studies: No results for input(s): TSH, T4TOTAL, T3FREE, THYROIDAB in the last 72 hours.  Invalid input(s): FREET3 Anemia work up: No results for input(s): VITAMINB12, FOLATE, FERRITIN, TIBC, IRON, RETICCTPCT in the last 72 hours. Sepsis Labs: Recent Labs  Lab 05/10/24 0610  05/11/24 0622 05/12/24 0552 05/13/24 0500  WBC 14.6* 13.4* 12.3* 12.1*    Microbiology  Recent Results (from the past 240 hours)  Culture, blood (Routine X 2) w Reflex to ID Panel     Status: None   Collection Time: 05/06/24 11:47 PM   Specimen: BLOOD  Result Value Ref Range Status   Specimen Description BLOOD SITE NOT SPECIFIED  Final   Special Requests   Final    BOTTLES DRAWN AEROBIC AND ANAEROBIC Blood Culture adequate volume   Culture   Final    NO GROWTH 5 DAYS Performed at [facility], [address], [place], [place] [address]    Report Status 05/12/2024 FINAL  Final  Culture, blood (Routine X 2) w Reflex to ID Panel     Status: None   Collection Time: 05/06/24 11:47 PM   Specimen: BLOOD  Result Value Ref Range Status   Specimen Description BLOOD SITE NOT SPECIFIED  Final   Special Requests   Final    BOTTLES DRAWN AEROBIC AND ANAEROBIC Blood Culture adequate volume   Culture   Final    NO GROWTH 5 DAYS Performed at [facility], [address], [place], [place] [address]    Report Status 05/12/2024 FINAL  Final    Procedures and diagnostic studies:  DG UGI W SINGLE CM (SOL OR THIN BA) Result Date: 05/12/2024 CLINICAL DATA:  Patient with history of perforated duodenal ulcer s/p exploratory laparotomy and Graham patch repair 05/07/24. Request for UGI to evaluate for post operative leak prior to removal of NG tube and advancing diet. EXAM: DG UGI W SINGLE CM TECHNIQUE: Scout radiograph was obtained. Single contrast examination was performed using water  soluble contrast. This exam was performed by Clotilda Hesselbach, PA-C, and was supervised and interpreted by Ester Sides, MD. FLUOROSCOPY: Radiation Exposure Index (as provided by the fluoroscopic device): 5.00 mGy Kerma COMPARISON:  CT chest/abd/pelvis w/o contrast 05/06/24, XR abdomen 1 view 05/07/24. FINDINGS: Scout Radiograph: Multiple leads overlying lower chest. Non specific bowel gas pattern, dilated loops  of colon in the left upper and lower quadrants. Right upper quadrant surgical drain. Nasogastric tube with tip in the gastric body. Esophagus: Not evaluated on problem focused exam. Esophageal motility:  Not evaluated on problem focused exam. Stomach: Normal appearance. No hiatal hernia. Gastric emptying: Normal. Duodenum: Normal appearance, no contrast extravasation . Other: Exam performed in prone AP, left oblique, right oblique only due to patient's ability to stand or lay laterally due to pain. Contrast was injected via existing nasogastric tube. IMPRESSION: Water  soluble problem focused UGI without evidence of contrast extravasation. Electronically Signed   By: Ester Sides M.D.   On: 05/12/2024 11:03               LOS: 10 days   Karsen Nakanishi  [facility]   Pager on [email or web address]. If 7PM-7AM, please contact night-coverage at [email or web address]     05/13/2024, 11:24 AM

## 2024-05-13 NOTE — Progress Notes (Signed)
 Physical Therapy Treatment Patient Details Name: Kyle Kelley MRN: [id number] DOB: [birth year] Today's Date: 05/13/2024   History of Present Illness Kyle Kelley is a 78 y.o. male admitted 05/03/24 with symptomatic anemia. He has been followed closely by vascular team outpatient and at visit 12/1 noted worsening wound dehiscence brought in 12/5 for debridement in OR and placement of wound VAC. Pt underwent EGD 12/6, which showed  gastritis, duodenal ulcers clean base nonbleeding, random gastric biopsies r/o h-pylori. PMHx: atrial fibrillation (on Eliquis /recently held), vasculopath, acute on chronic ischemic LLE nonhealing wound, HTN, HLD, aortic stenosis, CVA, prostate cancer s/p prostatectomy 2014, and PVD.    PT Comments  Pt had been sitting up in recliner all morning and self reports being tired but is agreeable to walking with therapy. Pt attempts to stand x2 with max-totalA but is not able to bring chest up and rotate pelvis to come to fully upright. Pt is very disappointed. Pt requires modA for squat pivot back to bed and for returning LE to bed. PT assures pt that sitting up is very important to his recovery and that we will try to progress mobility in his next session. PT remains guardedly optimistic about d/c plans, will reassess in next session. PT will continue to follow acutely.    If plan is discharge home, recommend the following: A little help with walking and/or transfers;A little help with bathing/dressing/bathroom;Assistance with cooking/housework;Assist for transportation;Help with stairs or ramp for entrance   Can travel by private vehicle      MAybe  Equipment Recommendations  None recommended by PT       Precautions / Restrictions Precautions Precautions: Fall Recall of Precautions/Restrictions: Intact Precaution/Restrictions Comments: wound vac LLE Restrictions Weight Bearing Restrictions Per Provider Order: No     Mobility  Bed Mobility Overal bed mobility:  Needs Assistance           Sit to sidelying: Mod assist General bed mobility comments: pt able to come down on his R elbow, requires modA for bringing LE into bed and then pt is able to roll to his back, requires modA for squaring hips and shoulders    Transfers Overall transfer level: Needs assistance Equipment used: Rolling walker (2 wheels) Transfers: Sit to/from Stand, Bed to chair/wheelchair/BSC Sit to Stand: Max assist     Squat pivot transfers: Mod assist     General transfer comment: pt attempts x2 to come to standing in RW, able to power up but is unable to look up and bring pelvis underneath him due to increased fatigue from sitting up all morning, with cuing for hand and foot placement pt is able to perfrom squat pivot transfer back to bed.    Ambulation/Gait               General Gait Details: unable due to increased fatigue       Balance Overall balance assessment: Needs assistance                                          Communication Communication Communication: Impaired Factors Affecting Communication: Hearing impaired  Cognition Arousal: Alert Behavior During Therapy: Flat affect   PT - Cognitive impairments: Problem solving, Safety/Judgement                       PT - Cognition Comments: slightly delayed processing Following commands: Impaired Following  commands impaired: Follows one step commands inconsistently    Cueing Cueing Techniques: Verbal cues, Gestural cues     General Comments General comments (skin integrity, edema, etc.): HR in 130s with attempt to come to standing, increased edema in B feet so socks were removed with return to bed.      Pertinent Vitals/Pain Pain Assessment Pain Assessment: Faces Faces Pain Scale: Hurts little more Pain Location: abdomen Pain Descriptors / Indicators: Sore Pain Intervention(s): Limited activity within patient's tolerance, Monitored during session, Repositioned      PT Goals (current goals can now be found in the care plan section) Acute Rehab PT Goals Patient Stated Goal: Return Home PT Goal Formulation: With patient/family Time For Goal Achievement: 05/19/24 Potential to Achieve Goals: Fair Progress towards PT goals: Not progressing toward goals - comment (extremely fatigued from sitting up in recliner all morning)    Frequency    Min 2X/week       AM-PAC PT 6 Clicks Mobility   Outcome Measure  Help needed turning from your back to your side while in a flat bed without using bedrails?: A Little Help needed moving from lying on your back to sitting on the side of a flat bed without using bedrails?: A Little Help needed moving to and from a bed to a chair (including a wheelchair)?: A Little Help needed standing up from a chair using your arms (e.g., wheelchair or bedside chair)?: A Little Help needed to walk in hospital room?: A Lot Help needed climbing 3-5 steps with a railing? : A Lot 6 Click Score: 16    End of Session Equipment Utilized During Treatment: Gait belt Activity Tolerance: Patient tolerated treatment well Patient left: with call bell/phone within reach;with family/visitor present;in chair Nurse Communication: Mobility status PT Visit Diagnosis: Difficulty in walking, not elsewhere classified (R26.2);Other abnormalities of gait and mobility (R26.89)     Time: 8689-8657 PT Time Calculation (min) (ACUTE ONLY): 32 min  Charges:    $Therapeutic Activity: 23-37 mins PT General Charges $$ ACUTE PT VISIT: 1 Visit           Arbell Wycoff B. [name] Lapidus PT, DPT Acute Rehabilitation Services Please use secure chat or  Call Office [phone]    Almarie KATHEE [name] Fleet 05/13/2024, 2:05 PM

## 2024-05-13 NOTE — Plan of Care (Signed)
  Problem: Education: Goal: Knowledge of General Education information will improve Description: Including pain rating scale, medication(s)/side effects and non-pharmacologic comfort measures Outcome: Progressing   Problem: Health Behavior/Discharge Planning: Goal: Ability to manage health-related needs will improve Outcome: Progressing   Problem: Clinical Measurements: Goal: Respiratory complications will improve Outcome: Progressing Goal: Cardiovascular complication will be avoided Outcome: Progressing   Problem: Activity: Goal: Risk for activity intolerance will decrease Outcome: Progressing   Problem: Nutrition: Goal: Adequate nutrition will be maintained Outcome: Progressing   Problem: Pain Managment: Goal: General experience of comfort will improve and/or be controlled Outcome: Progressing

## 2024-05-13 NOTE — Progress Notes (Signed)
 PT Cancellation Note  Patient Details Name: Kyle Kelley MRN: [id number] DOB: 12-16-1945   Cancelled Treatment:    Reason Eval/Treat Not Completed: (P) Other (comment) Pt just got up to recliner with Mobility Specialist and request PT come back later. PT will follow back this afternoon.  Sahalie Beth B. [name] Lapidus PT, DPT Acute Rehabilitation Services Please use secure chat or  Call Office [phone]    Almarie KATHEE [name] Fleet 05/13/2024, 10:26 AM

## 2024-05-13 NOTE — Progress Notes (Signed)
 Mobility Specialist Progress Note:   05/13/24 1203  Mobility  Activity Pivoted/transferred from bed to chair  Level of Assistance Moderate assist, patient does 50-74%  Assistive Device Front wheel walker  Distance Ambulated (ft) 4 ft  Activity Response Tolerated well  Mobility Referral Yes  Mobility visit 1 Mobility  Mobility Specialist Start Time (ACUTE ONLY) 0920  Mobility Specialist Stop Time (ACUTE ONLY) 0935  Mobility Specialist Time Calculation (min) (ACUTE ONLY) 15 min   Pt received in bed agreeable to mobility. Was able to perform bed level exercises before getting up. MinA for bed mobility and ModA for STS. C/o strong pain at incision site otherwise tolerated well. Ambulated on 2L/min, VSS. Left in chair w/ call bell and personal belongings in reach. All needs met.   Thersia Minder Mobility Specialist  Please contact vis Secure Chat or  Rehab Office [phone]

## 2024-05-13 NOTE — Progress Notes (Addendum)
°  Progress Note    05/13/2024 8:37 AM 6 Days Post-Op  Subjective:  no complaints   Vitals:   05/13/24 0400 05/13/24 0814  BP: 121/69 117/61  Pulse: 92 93  Resp: 13 20  Temp: 97.9 F (36.6 C) 98.5 F (36.9 C)  SpO2: 99% 100%   Physical Exam: Lungs:  non labored Incisions:  L medial lower leg wound pictured below Extremities:  L foot warm with motor and sensation intact Neurologic: A&O    CBC    Component Value Date/Time   WBC 12.1 (H) 05/13/2024 0500   RBC 3.09 (L) 05/13/2024 0500   HGB 8.5 (L) 05/13/2024 0500   HCT 28.3 (L) 05/13/2024 0500   PLT 196 05/13/2024 0500   MCV 91.6 05/13/2024 0500   MCH 27.5 05/13/2024 0500   MCHC 30.0 05/13/2024 0500   RDW 17.9 (H) 05/13/2024 0500   LYMPHSABS 1.0 03/26/2024 0640   MONOABS 1.0 03/26/2024 0640   EOSABS 0.0 03/26/2024 0640   BASOSABS 0.0 03/26/2024 0640    BMET    Component Value Date/Time   NA 139 05/13/2024 0500   K 4.4 05/13/2024 0500   CL 104 05/13/2024 0500   CO2 32 05/13/2024 0500   GLUCOSE 116 (H) 05/13/2024 0500   BUN 19 05/13/2024 0500   CREATININE 0.41 (L) 05/13/2024 0500   CALCIUM  7.5 (L) 05/13/2024 0500   GFRNONAA >60 05/13/2024 0500   GFRAA >90 11/01/2012 0522    INR    Component Value Date/Time   INR 1.2 05/04/2024 0550     Intake/Output Summary (Last 24 hours) at 05/13/2024 0837 Last data filed at 05/13/2024 0820 Gross per 24 hour  Intake 1229.76 ml  Output 870 ml  Net 359.76 ml     Assessment/Plan:  78 y.o. male is s/p debridement of L below knee postsurgical wound 6 Days Post-Op   LLE warm and well perfused.  Wound vac changed this morning.  Wound bed with viable muscle.  Next vac change Thursday.  Plavix  when ok with general surgery.  Will also need Eliquis  prior to discharge.   Kyle Sender, PA-C [facility] [phone] 05/13/2024 8:37 AM  I have independently interviewed and examined patient and agree with PA assessment and plan above.  Plavix   has been restarted.  Will change wound VAC if patient is still here Thursday.  Kyle Zmuda C. Sheree, MD [facility] [place] Office: [phone] Pager: [phone]

## 2024-05-13 NOTE — Progress Notes (Addendum)
 PHARMACY - TOTAL PARENTERAL NUTRITION CONSULT NOTE  Indication: perforated viscus   Patient Measurements: Height: 6' 1 (185.4 cm) Weight: 82.3 kg (181 lb 7 oz) IBW/kg (Calculated) : 79.9 TPN AdjBW (KG): 77.6 Body mass index is 23.94 kg/m. Usual Weight: ~78 kg (previous weights between 159 - 171 lbs over past 6 months), now up to 81 kg  Assessment:  78 y.o. male with history of prostate cancer s/p prostatectomy and radiation therapy who underwent debridement of left below knee postsurgical wound 12/06.  Found to have severe anemia with a hemoglobin of 6.2 post-op. Patient states that for the past 3 months he has noticed that stools have been unusually dark, black, usually 1 to 2/day.   GI was consulted 12/06 due to persistent abdominal pain post-procedure prompting CT scan. EGD was notable for non-bleeding ulcer in the duodenal bulb. CT with small volume pneumoperitoneum tracking near the duodenum. Surgery recommends bowel rest with NGTS, TPN for nutrtion while NPO.  Glucose / Insulin : no hx DM, A1c 5.7% in 02/2024 - CBGs < 180 SSI/CBG checks D/C'ed 05/12/24 Electrolytes: K 4.4 and Phos 2.8 post KPhos 15 mmol IV, others WNL Renal: SCr < 1, BUN WNL - Lasix  daily x3 days, last dose 12/12 Hepatic: AST/ALT mildly elevated, alk phos / tbili / TG WNL, albumin  < 1.5 Intake / Output; MIVF:  UOP 0.4 mL/kg/hr, NG 0mL, drain , LBM 12/4, net +2.1L GI Imaging: 12/8 CT: suspicious for perf duodenal ulcer, possible hemoperitoneum 12/14 uGI: no extravasation  GI Surgeries / Procedures: none since start of TPN  Central access: 05/07/24 TPN start date:  05/07/24  Nutritional Goals: Goal TPN rate is 65 mL/hr (provides 105g AA and 2103 kCal per day)  RD Estimated Needs Total Energy Estimated Needs: 2100-2300 kcals Total Protein Estimated Needs: 100-115g Total Fluid Estimated Needs: >2L/day  Current Nutrition:  TPN Advance to soft diet  Plan:  Half TPN today and possibly stopping tomorrow  per Surgery  Reduce TPN to 35 ml/hr at 1800 to meet ~50% of needs Electrolytes in TPN: Na 85 mEq/L, K 65 mEq/L, Ca 5 mEq/L, Mg 10 mEq/L, Phos 30 mmol/L, max CL - lytes reduced with half TPN Add standard MVI and trace elements to TPN D/C thiamine  Monitor TPN labs on Mon/Thurs - BMET and Phos in AM Monitor volume status F/u PO intake to wean off of TPN in AM  Eulonda Andalon D. Lendell, PharmD, BCPS, BCCCP 05/13/2024, 9:10 AM

## 2024-05-13 NOTE — Progress Notes (Signed)
°  6 Days Post-Op   Chief Complaint/Subjective: Tolerating liquids, pain controlled, +flatus  Objective: Vital signs in last 24 hours: Temp:  [97.6 F (36.4 C)-98.5 F (36.9 C)] 98.5 F (36.9 C) (12/15 0814) Pulse Rate:  [92-94] 93 (12/15 0814) Resp:  [13-20] 20 (12/15 0814) BP: (113-121)/(61-75) 117/61 (12/15 0814) SpO2:  [98 %-100 %] 100 % (12/15 0814) Weight:  [82.3 kg] 82.3 kg (12/15 0447) Last BM Date : 05/02/24 Intake/Output from previous day: 12/14 0701 - 12/15 0700 In: 1029.8 [P.O.:240; I.V.:639.8; IV Piggyback:150] Out: 870 [Urine:700; Drains:170]  PE: Gen: NAD Resp: nonlabored Card: RRR Abd: soft, incision open with clean base, drain with thin serous output  Lab Results:  Recent Labs    05/12/24 0552 05/13/24 0500  WBC 12.3* 12.1*  HGB 8.7* 8.5*  HCT 28.1* 28.3*  PLT 174 196   Recent Labs    05/12/24 0552 05/13/24 0500  NA 137 139  K 3.9 4.4  CL 100 104  CO2 30 32  GLUCOSE 125* 116*  BUN 18 19  CREATININE 0.41* 0.41*  CALCIUM  7.5* 7.5*   No results for input(s): LABPROT, INR in the last 72 hours.    Component Value Date/Time   NA 139 05/13/2024 0500   K 4.4 05/13/2024 0500   CL 104 05/13/2024 0500   CO2 32 05/13/2024 0500   GLUCOSE 116 (H) 05/13/2024 0500   BUN 19 05/13/2024 0500   CREATININE 0.41 (L) 05/13/2024 0500   CALCIUM  7.5 (L) 05/13/2024 0500   PROT 5.0 (L) 05/13/2024 0500   PROT 7.6 02/01/2022 1110   ALBUMIN  <1.5 (L) 05/13/2024 0500   ALBUMIN  4.5 02/01/2022 1110   AST 56 (H) 05/13/2024 0500   ALT 70 (H) 05/13/2024 0500   ALKPHOS 67 05/13/2024 0500   BILITOT 0.6 05/13/2024 0500   BILITOT 0.5 02/01/2022 1110   GFRNONAA >60 05/13/2024 0500   GFRAA >90 11/01/2012 0522    Assessment/Plan  s/p Procedures: LAPAROTOMY, EXPLORATORY 05/07/2024  -advance diet -remove drain -continue wound packing -PT  FEN - soft diet, ensure VTE - does not appear to be on chemical proph or other blood thinner ID - zosyn  12/9-->, could stop  today from gen surg standpoint Disposition - inpatient   LOS: 10 days   I reviewed last 24 h vitals and pain scores, last 48 h intake and output, last 24 h labs and trends, and last 24 h imaging results.  This care required moderate level of medical decision making.   Kyle Kelley [facility] Surgery at [facility] 05/13/2024, 8:24 AM Please see Amion for pager number during day hours 7:00am-4:30pm or 7:00am -11:30am on weekends

## 2024-05-14 DIAGNOSIS — E43 Unspecified severe protein-calorie malnutrition: Secondary | ICD-10-CM | POA: Insufficient documentation

## 2024-05-14 LAB — GLUCOSE, CAPILLARY
Glucose-Capillary: 104 mg/dL — ABNORMAL HIGH (ref 70–99)
Glucose-Capillary: 146 mg/dL — ABNORMAL HIGH (ref 70–99)
Glucose-Capillary: 99 mg/dL (ref 70–99)

## 2024-05-14 LAB — PHOSPHORUS: Phosphorus: 2.5 mg/dL (ref 2.5–4.6)

## 2024-05-14 LAB — BASIC METABOLIC PANEL WITH GFR
Anion gap: 5 (ref 5–15)
BUN: 23 mg/dL (ref 8–23)
CO2: 30 mmol/L (ref 22–32)
Calcium: 7.9 mg/dL — ABNORMAL LOW (ref 8.9–10.3)
Chloride: 105 mmol/L (ref 98–111)
Creatinine, Ser: 0.36 mg/dL — ABNORMAL LOW (ref 0.61–1.24)
GFR, Estimated: >60 mL/min (ref >60)
Glucose, Bld: 77 mg/dL (ref 70–99)
Potassium: 4.6 mmol/L (ref 3.5–5.1)
Sodium: 140 mmol/L (ref 135–145)

## 2024-05-14 MED ORDER — PANTOPRAZOLE SODIUM 40 MG PO TBEC
40.0000 mg | DELAYED_RELEASE_TABLET | Freq: Two times a day (BID) | ORAL | Status: DC
  Administered 2024-05-14 – 2024-05-18 (×10): 40 mg via ORAL
  Filled 2024-05-14 (×11): qty 1

## 2024-05-14 MED ORDER — POLYETHYLENE GLYCOL 3350 17 G PO PACK
17.0000 g | PACK | Freq: Every day | ORAL | Status: DC
  Filled 2024-05-14 (×3): qty 1

## 2024-05-14 MED ORDER — SENNOSIDES-DOCUSATE SODIUM 8.6-50 MG PO TABS
1.0000 | ORAL_TABLET | Freq: Two times a day (BID) | ORAL | Status: DC
  Administered 2024-05-15 (×2): 1 via ORAL
  Filled 2024-05-14 (×9): qty 1

## 2024-05-14 MED ORDER — APIXABAN 5 MG PO TABS
5.0000 mg | ORAL_TABLET | Freq: Two times a day (BID) | ORAL | Status: DC
  Administered 2024-05-14 – 2024-05-20 (×12): 5 mg via ORAL
  Filled 2024-05-14 (×12): qty 1

## 2024-05-14 NOTE — Plan of Care (Signed)
  Problem: Education: Goal: Knowledge of General Education information will improve Description: Including pain rating scale, medication(s)/side effects and non-pharmacologic comfort measures Outcome: Progressing   Problem: Clinical Measurements: Goal: Diagnostic test results will improve Outcome: Progressing Goal: Respiratory complications will improve Outcome: Progressing Goal: Cardiovascular complication will be avoided Outcome: Progressing   Problem: Nutrition: Goal: Adequate nutrition will be maintained Outcome: Progressing   Problem: Pain Managment: Goal: General experience of comfort will improve and/or be controlled Outcome: Progressing

## 2024-05-14 NOTE — Progress Notes (Signed)
°  7 Days Post-Op   Chief Complaint/Subjective: Tolerated eggs, bacon, grits this morning.  pain controlled, +flatus, he does not think he has had a BM in over one week. States he lives in a home with a car port that they had turned into a room, then two step entry then single-story home. He plans to go home with his wife, not to a facility.   Objective: Vital signs in last 24 hours: Temp:  [97.4 F (36.3 C)-97.9 F (36.6 C)] 97.9 F (36.6 C) (12/16 0417) Pulse Rate:  [92-124] 92 (12/16 0417) Resp:  [14-20] 17 (12/16 0417) BP: (120-131)/(64-71) 123/65 (12/16 0417) SpO2:  [94 %-100 %] 99 % (12/16 0417) Weight:  [83.7 kg] 83.7 kg (12/16 0417) Last BM Date : 05/02/24 Intake/Output from previous day: 12/15 0701 - 12/16 0700 In: 515.4 [P.O.:200; I.V.:207.7; IV Piggyback:107.7] Out: 1300 [Urine:850; Drains:450]  PE: Gen: NAD Resp: slightly labored ORA Card: RRR, HBR 80's Abd: soft, mild distention, incision open with clean base, drain with thin serous output (>300 mL),   Lab Results:  Recent Labs    05/12/24 0552 05/13/24 0500  WBC 12.3* 12.1*  HGB 8.7* 8.5*  HCT 28.1* 28.3*  PLT 174 196   Recent Labs    05/12/24 0552 05/13/24 0500  NA 137 139  K 3.9 4.4  CL 100 104  CO2 30 32  GLUCOSE 125* 116*  BUN 18 19  CREATININE 0.41* 0.41*  CALCIUM  7.5* 7.5*   No results for input(s): LABPROT, INR in the last 72 hours.    Component Value Date/Time   NA 139 05/13/2024 0500   K 4.4 05/13/2024 0500   CL 104 05/13/2024 0500   CO2 32 05/13/2024 0500   GLUCOSE 116 (H) 05/13/2024 0500   BUN 19 05/13/2024 0500   CREATININE 0.41 (L) 05/13/2024 0500   CALCIUM  7.5 (L) 05/13/2024 0500   PROT 5.0 (L) 05/13/2024 0500   PROT 7.6 02/01/2022 1110   ALBUMIN  <1.5 (L) 05/13/2024 0500   ALBUMIN  4.5 02/01/2022 1110   AST 56 (H) 05/13/2024 0500   ALT 70 (H) 05/13/2024 0500   ALKPHOS 67 05/13/2024 0500   BILITOT 0.6 05/13/2024 0500   BILITOT 0.5 02/01/2022 1110   GFRNONAA >60  05/13/2024 0500   GFRAA >90 11/01/2012 0522    Assessment/Plan  s/p Procedures: LAPAROTOMY, EXPLORATORY 05/07/2024  -soft diet -remove drain today -continue wound packing, discussed with RN to perform wound care teaching with patients wife to make sure she can assist with wound care on T/Th/weekends, he has [facility] RN already for LLE VAC M/W/F. - increase bowel regimen - general surgery will sign off. Please call as needed. Follow up with Dr. Vernetta being arranged.  FEN - soft diet, ensure VTE - does not appear to be on chemical proph or other blood thinner ID - [email or web address]  12/9-12/15 Disposition - inpatient   LOS: 11 days   I reviewed last 24 h vitals and pain scores, last 48 h intake and output, last 24 h labs and trends, and last 24 h imaging results.  This care required moderate level of medical decision making.   Almarie RAMAN [facility] Surgery at [facility] 05/14/2024, 8:31 AM Please see Amion for pager number during day hours 7:00am-4:30pm or 7:00am -11:30am on weekends

## 2024-05-14 NOTE — Progress Notes (Signed)
 Progress Note    Kyle Kelley  FMW:[id number] DOB: 03-28-1946  DOA: 05/03/2024 PCP: Charlott Dorn LABOR, MD      Brief Narrative:    Medical records reviewed and are as summarized below:  Kyle Kelley is a 78 y.o. male extensive history of atrial fibrillation (on Eliquis /recently held), vasculopath, with acute on chronic ischemic left lower extremity nonhealing wound, HTN, HLD, Aortic stenosis, CVA, prostate cancer s/p prostatectomy 2014 who presented on 12/5 to the vascular team for follow-up left lower extremity wound.    He presented 03/25/2024 for acute left limb ischemia, vascular was consulted and underwent left lower extremity thromboembolectomy via common femoral and below knee popliteal artery exposure incisions using 3 and 4 fogarty balloons, left tibioperoneal trunk endarterectomy with vein patch angioplasty.  Harvest L greater saphenous vein.  Stent L SFA.   In outpatient follow up, 04/29/2024 worsening wound dehiscence was noted.  He was brought back 05/03/2024 for debridement in OR and placement of wound VAC.   Patient Eliquis  has been held since 04/29/2024.   Labs on 12/5 revealed hemoglobin of 6.2.  Patient was admitted for evaluation of the anemia and was transfused.  Gastroenterology was consulted.  Patient underwent EGD on 12/6.   Patient had persistent abdominal pain after the procedure.  On 12/7, he developed lactic acidosis and leukocytosis.  Responded to fluids, lactic acid trended down.  Patient denied severe symptoms.  On 12/8, patient noted to have worsening leukocytosis.  CT C/A/P ordered and revealed free air, likely due to perforated duodenal ulcer.     Surgery was consulted, he's now s/p ex lap/graham patch.       Assessment/Plan:   Principal Problem:   Symptomatic anemia Active Problems:   Dizziness   Atrial fibrillation (HCC)   ICH (intracerebral hemorrhage) (HCC)   Critical limb ischemia of left lower extremity (HCC)   Essential  hypertension, benign   Aortic stenosis   Mixed hyperlipidemia   History of stroke without residual deficits   Protein-calorie malnutrition, severe   Nutrition Problem: Severe Malnutrition Etiology: acute illness  Signs/Symptoms: moderate fat depletion, moderate muscle depletion, severe muscle depletion   Body mass index is 24.35 kg/m.    Perforated duodenal ulcer, acute GI bleed,: This was noted on EGD on 05/04/2024 Found to have pneumoperitoneum in right upper quadrant with focus of free air on CT abdomen and pelvis. S/p ex lap abdominal, Graham patch repair of perforated duodenal ulcer on 05/07/2024 Upper GI series on 05/12/2024 did not show any evidence of leak. He is tolerating a soft diet.  He is still on TPN.  Plan to stop TPN later today. General surgery has signed off.  He will follow-up with Dr. Vernetta, surgeon, as an outpatient. Continue Protonix  twice daily for 8 weeks for total of nausea and gastritis following EGD.   Acute blood loss anemia: Hemoglobin stable between 8-9.4.  S/p transfusion with 2 units of PRBCs on 05/03/2024.   Acute on chronic HFpEF, bilateral lower extremity edema, moderate bilateral pleural effusion, anasarca: S/p treatment with IV Lasix  2D echo in October 25 showed preserved EF. Venous duplex of the lower extremities negative for DVT  History of aortic stenosis, 2D echo on 03/18/2024 showed EF estimated at 55 to 60%, aortic valve was not well visualized and moved significant stenosis was reported. Cardiologist recommended repeating echo in 1 to 2 months as an outpatient   Acute hypoxic respiratory failure: Wean off oxygen as able.   Severe hypoalbuminemia: Likely  from acute illness, recent surgery and poor oral intake. Follow-up with dietitian.   Atrial fibrillation: Slightly tachycardic but overall controlled.  Continue Toprol -XL.  S/p treatment with IV metoprolol . Case discussed with Dr. Ebbie, surgeon and Ms. Simaan, PA, surgery  team.   Okay to start Eliquis .   PVD with acute left lower extremity limb ischemia in October 2025: S/p left lower extremity thromboembolectomy on 03/25/2024. S/p wound debridement of left leg wound.  Continue wound VAC therapy. Continue Plavix . Follow-up with vascular surgeon   Hypokalemia: Improved Hyponatremia: Improved   Stage III sacral decubitus ulcer: Present on admission.  Continue local wound care   Comorbidities include hypertension, hyperlipidemia, nodular hepatic contour, history of stroke, remote history of intracranial bleed        Diet Order             DIET SOFT Room service appropriate? Yes; Fluid consistency: Thin  Diet effective now                                  Consultants: General Surgeon Cardiologist Gastroenterologist Vascular surgeon  Procedures: EGD 05/04/2024 Excisional debridement left below-knee postsurgical wound on 05/03/2024 Exploratory laparotomy, Arlyss patch repair of perforated duodenal ulcer on 05/07/2024.     Medications:    apixaban   5 mg Oral BID   Chlorhexidine  Gluconate Cloth  6 each Topical Daily   clopidogrel   75 mg Oral Daily   feeding supplement  237 mL Oral TID BM   liver oil-zinc  oxide   Topical BID   methocarbamol  (ROBAXIN ) injection  500 mg Intravenous Q8H   metoprolol  succinate  25 mg Oral Daily   multivitamin with minerals  1 tablet Oral Daily   pantoprazole   40 mg Oral BID   polyethylene glycol  17 g Oral Daily   senna-docusate  1 tablet Oral BID   sodium chloride  flush  10-40 mL Intracatheter Q12H   sodium chloride  flush  3 mL Intravenous Q12H   sodium chloride  flush  3 mL Intravenous Q12H   Continuous Infusions:  TPN ADULT (ION) 35 mL/hr at 05/13/24 1802     Anti-infectives (From admission, onward)    Start     Dose/Rate Route Frequency Ordered Stop   05/07/24 1930  micafungin  (MYCAMINE ) 100 mg in sodium chloride  0.9 % 100 mL IVPB        100 mg 105 mL/hr over 1 Hours  Intravenous Every 24 hours 05/07/24 1832 05/12/24 0536   05/07/24 0000  piperacillin -tazobactam (ZOSYN ) IVPB 3.375 g  Status:  Discontinued        3.375 g 12.5 mL/hr over 240 Minutes Intravenous Every 8 hours 05/06/24 2241 05/14/24 0730   05/03/24 0842  ceFAZolin  (ANCEF ) IVPB 2g/100 mL premix        2 g 200 mL/hr over 30 Minutes Intravenous 30 min pre-op 05/03/24 9157 05/03/24 1157              Family Communication/Anticipated D/C date and plan/Code Status   DVT prophylaxis: Place and maintain sequential compression device Start: 05/09/24 1216 SCDs Start: 05/03/24 1250 apixaban  (ELIQUIS ) tablet 5 mg     Code Status: Full Code  Family Communication: Plan discussed with his wife at the bedside Disposition Plan: Possible discharge to SNF   Status is: Inpatient Remains inpatient appropriate because: Perforated duodenal ulcer s/p laparotomy and Arlyss patch repair       Subjective:   Interval events noted.  Cough is  better.  No shortness of breath.  Abdomen is still a little sore.  OT was at the bedside.  He said he felt the area to move his bowels but wanted to try on the commode.   Objective:    Vitals:   05/13/24 2315 05/14/24 0417 05/14/24 0800 05/14/24 1153  BP: 129/70 123/65 127/65 112/61  Pulse: (!) 104 92  91  Resp: 20 17 14    Temp: 97.9 F (36.6 C) 97.9 F (36.6 C) 97.7 F (36.5 C)   TempSrc: Oral Oral Oral   SpO2: 100% 99% 92%   Weight:  83.7 kg    Height:       No data found.   Intake/Output Summary (Last 24 hours) at 05/14/2024 1515 Last data filed at 05/14/2024 1452 Gross per 24 hour  Intake 717.71 ml  Output 1615 ml  Net -897.29 ml   Filed Weights   05/12/24 0534 05/13/24 0447 05/14/24 0417  Weight: 83 kg 82.3 kg 83.7 kg    Exam:  GEN: NAD SKIN: Warm and dry EYES: No pallor or icterus ENT: MMM CV: RRR PULM: CTA B ABD: soft, ND, NT, +BS CNS: AAO x 3, non focal EXT: Significant bilateral lower extremity edema from the thighs  to the feet    Data Reviewed:   I have personally reviewed following labs and imaging studies:  Labs: Labs show the following:   Basic Metabolic Panel: Recent Labs  Lab 05/09/24 0420 05/10/24 0610 05/11/24 0622 05/12/24 0552 05/13/24 0500 05/14/24 1159  NA 135 134* 139 137 139 140  K 3.6 3.4* 3.6 3.9 4.4 4.6  CL 98 96* 99 100 104 105  CO2 34* 31 31 30  32 30  GLUCOSE 126* 139* 130* 125* 116* 77  BUN 18 17 18 18 19 23   CREATININE 0.48* 0.46* 0.51* 0.41* 0.41* 0.36*  CALCIUM  7.4* 7.3* 7.8* 7.5* 7.5* 7.9*  MG 1.9 1.8 2.0 2.1 2.1  --   PHOS 2.1* 2.0* 2.3* 2.5 2.8 2.5   GFR Estimated Creatinine Clearance: 88.8 mL/min (A) (by C-G formula based on SCr of 0.36 mg/dL (L)). Liver Function Tests: Recent Labs  Lab 05/08/24 1410 05/09/24 0420 05/13/24 0500  AST  --  29 56*  ALT  --  22 70*  ALKPHOS  --  46 67  BILITOT  --  0.6 0.6  PROT  --  4.7* 5.0*  ALBUMIN  1.8* 1.6* <1.5*   No results for input(s): LIPASE, AMYLASE in the last 168 hours. No results for input(s): AMMONIA in the last 168 hours. Coagulation profile No results for input(s): INR, PROTIME in the last 168 hours.  CBC: Recent Labs  Lab 05/09/24 0420 05/10/24 0610 05/11/24 0622 05/12/24 0552 05/13/24 0500  WBC 20.5* 14.6* 13.4* 12.3* 12.1*  HGB 8.5* 9.1* 9.1* 8.7* 8.5*  HCT 27.7* 28.7* 29.3* 28.1* 28.3*  MCV 91.4 91.1 89.1 91.5 91.6  PLT 162 142* 150 174 196   Cardiac Enzymes: No results for input(s): CKTOTAL, CKMB, CKMBINDEX, TROPONINI in the last 168 hours. BNP (last 3 results) No results for input(s): PROBNP in the last 8760 hours. CBG: Recent Labs  Lab 05/13/24 0623 05/13/24 1208 05/13/24 1802 05/13/24 2336 05/14/24 0636  GLUCAP 132* 128* 148* 113* 104*   D-Dimer: No results for input(s): DDIMER in the last 72 hours. Hgb A1c: No results for input(s): HGBA1C in the last 72 hours. Lipid Profile: Recent Labs    05/13/24 0500  TRIG 34   Thyroid  function  studies: No results for  input(s): TSH, T4TOTAL, T3FREE, THYROIDAB in the last 72 hours.  Invalid input(s): FREET3 Anemia work up: No results for input(s): VITAMINB12, FOLATE, FERRITIN, TIBC, IRON, RETICCTPCT in the last 72 hours. Sepsis Labs: Recent Labs  Lab 05/10/24 0610 05/11/24 0622 05/12/24 0552 05/13/24 0500  WBC 14.6* 13.4* 12.3* 12.1*    Microbiology Recent Results (from the past 240 hours)  Culture, blood (Routine X 2) w Reflex to ID Panel     Status: None   Collection Time: 05/06/24 11:47 PM   Specimen: BLOOD  Result Value Ref Range Status   Specimen Description BLOOD SITE NOT SPECIFIED  Final   Special Requests   Final    BOTTLES DRAWN AEROBIC AND ANAEROBIC Blood Culture adequate volume   Culture   Final    NO GROWTH 5 DAYS Performed at [facility], [address] [place], [place] [address]    Report Status 05/12/2024 FINAL  Final  Culture, blood (Routine X 2) w Reflex to ID Panel     Status: None   Collection Time: 05/06/24 11:47 PM   Specimen: BLOOD  Result Value Ref Range Status   Specimen Description BLOOD SITE NOT SPECIFIED  Final   Special Requests   Final    BOTTLES DRAWN AEROBIC AND ANAEROBIC Blood Culture adequate volume   Culture   Final    NO GROWTH 5 DAYS Performed at [facility], [address], [place], [place] [address]    Report Status 05/12/2024 FINAL  Final    Procedures and diagnostic studies:  No results found.              LOS: 11 days   Czar Ysaguirre  [employer]er on [email or web address]. If 7PM-7AM, please contact night-coverage at [email or web address]     05/14/2024, 3:15 PM

## 2024-05-14 NOTE — Progress Notes (Signed)
 Occupational Therapy Treatment Patient Details Name: Kyle Kelley MRN: [id number] DOB: [birth year] Today's Date: 05/14/2024   History of present illness Kyle Kelley is a 78 y.o. male admitted 05/03/24 with symptomatic anemia. He has been followed closely by vascular team outpatient and at visit 12/1 noted worsening wound dehiscence brought in 12/5 for debridement in OR and placement of wound VAC. Pt underwent EGD 12/6, which showed  gastritis, duodenal ulcers clean base nonbleeding, random gastric biopsies r/o h-pylori. PMHx: atrial fibrillation (on Eliquis /recently held), vasculopath, acute on chronic ischemic LLE nonhealing wound, HTN, HLD, aortic stenosis, CVA, prostate cancer s/p prostatectomy 2014, and PVD.   OT comments  Patient received in supine and agreeable to OT treatment. Patient demonstrating gains with supine to sitting on EOB with CGA and verbal cues.  Patient able to ambulate short distance to perform grooming standing at sink and returned to EOB. Once on EOB patient asked to use BSC. Patient was min assist for transfer and for hygiene following BM.  Patient asked to return to bed due to fatigue.  Discharge recommendations continue to be appropriate. Acute OT to continue to follow to address established goals.       If plan is discharge home, recommend the following:  A little help with walking and/or transfers;A little help with bathing/dressing/bathroom;Assistance with cooking/housework;Assist for transportation;Help with stairs or ramp for entrance   Equipment Recommendations  None recommended by OT    Recommendations for Other Services      Precautions / Restrictions Precautions Precautions: Fall Recall of Precautions/Restrictions: Intact Precaution/Restrictions Comments: wound vac LLE Restrictions Weight Bearing Restrictions Per Provider Order: No       Mobility Bed Mobility Overal bed mobility: Needs Assistance Bed Mobility: Supine to Sit, Sit to Sidelying      Supine to sit: Contact guard, HOB elevated, Used rails   Sit to sidelying: Mod assist General bed mobility comments: increased time and verbal cues throughout to get to EOB with use of rails, assistance with BLEs and instructions on resting on right elbow to return to supine    Transfers Overall transfer level: Needs assistance Equipment used: Rolling walker (2 wheels) Transfers: Sit to/from Stand, Bed to chair/wheelchair/BSC Sit to Stand: Min assist     Step pivot transfers: Contact guard assist     General transfer comment: bed elevated to stand from EOB and cues for hand placement.     Balance Overall balance assessment: Needs assistance Sitting-balance support: Bilateral upper extremity supported, Feet supported Sitting balance-Leahy Scale: Fair Sitting balance - Comments: EOB   Standing balance support: Single extremity supported, Bilateral upper extremity supported, During functional activity Standing balance-Leahy Scale: Poor Standing balance comment: able to stand with one extremity support during grooming tasks, dependent on external support when standing                           ADL either performed or assessed with clinical judgement   ADL Overall ADL's : Needs assistance/impaired     Grooming: Wash/dry hands;Wash/dry face;Oral care;Contact guard assist;Standing Grooming Details (indicate cue type and reason): at sink                 Toilet Transfer: Minimal assistance;Ambulation;Regular Toilet;Rolling walker (2 wheels);BSC/3in1   Toileting- Clothing Manipulation and Hygiene: Minimal assistance;Sitting/lateral lean Toileting - Clothing Manipulation Details (indicate cue type and reason): min assist to complete            Extremity/Trunk Assessment  Vision       Perception     Praxis     Communication Communication Communication: Impaired Factors Affecting Communication: Hearing impaired   Cognition  Arousal: Alert Behavior During Therapy: Flat affect Cognition: No apparent impairments                               Following commands: Impaired Following commands impaired: Follows one step commands inconsistently      Cueing   Cueing Techniques: Verbal cues, Gestural cues  Exercises      Shoulder Instructions       General Comments HR increased to 130s while standing at sink    Pertinent Vitals/ Pain       Pain Assessment Pain Assessment: Faces Faces Pain Scale: Hurts little more Pain Location: abdomen Pain Descriptors / Indicators: Sore Pain Intervention(s): Limited activity within patient's tolerance, Monitored during session, Repositioned  Home Living                                          Prior Functioning/Environment              Frequency  Min 2X/week        Progress Toward Goals  OT Goals(current goals can now be found in the care plan section)  Progress towards OT goals: Progressing toward goals     Plan      Co-evaluation                 AM-PAC OT 6 Clicks Daily Activity     Outcome Measure   Help from another person eating meals?: None Help from another person taking care of personal grooming?: A Little Help from another person toileting, which includes using toliet, bedpan, or urinal?: A Lot Help from another person bathing (including washing, rinsing, drying)?: A Lot Help from another person to put on and taking off regular upper body clothing?: A Little Help from another person to put on and taking off regular lower body clothing?: A Lot 6 Click Score: 16    End of Session Equipment Utilized During Treatment: Gait belt;Rolling walker (2 wheels)  OT Visit Diagnosis: Unsteadiness on feet (R26.81);Other abnormalities of gait and mobility (R26.89);Muscle weakness (generalized) (M62.81)   Activity Tolerance Patient tolerated treatment well   Patient Left in bed;with call bell/phone within  reach;with bed alarm set;with nursing/sitter in room   Nurse Communication Mobility status;Other (comment) (red spot on sacral area)        Time: 9189-9140 OT Time Calculation (min): 49 min  Charges: OT General Charges $OT Visit: 1 Visit OT Treatments $Self Care/Home Management : 38-52 mins  Kyle Kelley, OTA Acute Rehabilitation Services  Office [phone]   Kyle Kelley Kelley 05/14/2024, 10:47 AM

## 2024-05-14 NOTE — Progress Notes (Signed)
 PHARMACY - TOTAL PARENTERAL NUTRITION CONSULT NOTE  Indication: perforated viscus   Patient Measurements: Height: 6' 1 (185.4 cm) Weight: 83.7 kg (184 lb 8.4 oz) IBW/kg (Calculated) : 79.9 TPN AdjBW (KG): 77.6 Body mass index is 24.35 kg/m. Usual Weight: ~78 kg (previous weights between 159 - 171 lbs over past 6 months), now up to 83 kg  Assessment:  78 y.o. male with history of prostate cancer s/p prostatectomy and radiation therapy who underwent debridement of left below knee postsurgical wound 12/06.  Found to have severe anemia with a hemoglobin of 6.2 post-op. Patient states that for the past 3 months he has noticed that stools have been unusually dark, black, usually 1 to 2/day.   GI was consulted 12/06 due to persistent abdominal pain post-procedure prompting CT scan. EGD was notable for non-bleeding ulcer in the duodenal bulb. CT with small volume pneumoperitoneum tracking near the duodenum. Surgery recommends bowel rest with NGTS, TPN for nutrtion while NPO.  Glucose / Insulin : no hx DM, A1c 5.7% in 02/2024 - CBGs < 180 SSI/CBG checks D/C'ed 05/12/24 Electrolytes: last 12/15 Phos 2.8 post KPhos 15 mmol IV, others WNL Renal: last 12/15 SCr < 1, BUN WNL  Hepatic: last 12/15 AST/ALT mildly elevated, alk phos / tbili / TG WNL, albumin  < 1.5 Intake / Output; MIVF:  UOP , JP drain , LBM 12/4  GI Imaging: 12/8 CT: suspicious for perf duodenal ulcer, possible hemoperitoneum 12/14 uGI: no extravasation  GI Surgeries / Procedures: none since start of TPN  Central access: 05/07/24 TPN start date:  05/07/24  Nutritional Goals: Goal TPN rate is 65 mL/hr (provides 105g AA and 2103 kCal per day)  RD Estimated Needs Total Energy Estimated Needs: 2100-2300 kcals Total Protein Estimated Needs: 100-115g Total Fluid Estimated Needs: >2L/day  Current Nutrition:  TPN Advance to soft diet (50% eaten 12/15)  Plan:  Plan to stop TPN today per surgery  Thank you for allowing  pharmacy to participate in this patient's care.  Leonor GORMAN Bash, PharmD Emergency Medicine Clinical Pharmacist 05/14/2024,7:25 AM

## 2024-05-15 DIAGNOSIS — D649 Anemia, unspecified: Secondary | ICD-10-CM | POA: Diagnosis not present

## 2024-05-15 MED ORDER — METHOCARBAMOL 500 MG PO TABS
500.0000 mg | ORAL_TABLET | Freq: Three times a day (TID) | ORAL | Status: DC
  Administered 2024-05-15: 11:00:00 500 mg via ORAL
  Filled 2024-05-15 (×8): qty 1

## 2024-05-15 NOTE — NC FL2 (Signed)
 [place]  MEDICAID FL2 LEVEL OF CARE FORM     IDENTIFICATION  Patient Name: Kyle Kelley Birthdate: 1945-12-18 Sex: male Admission Date (Current Location): 05/03/2024  [facility] and [place] Number:  [employer] and Address:  [facility] [place], [address] [place], [place] [address]      Provider Number: [id number]  Attending Physician Name and Address:  Kyle Nilda HERO, MD  Relative Name and Phone Number:  Kyle Kelley; Spouse; [phone]    Current Level of Care: Hospital Recommended Level of Care: Skilled Nursing Facility Prior Approval Number:    Date Approved/Denied:   PASRR Number: [id number]  Discharge Plan: SNF    Current Diagnoses: Patient Active Problem List   Diagnosis Date Noted   Protein-calorie malnutrition, severe 05/14/2024   Symptomatic anemia 05/03/2024   Critical limb ischemia of left lower extremity (HCC) 03/26/2024   Critical lower limb ischemia (HCC) 03/26/2024   History of stroke without residual deficits 03/25/2024   Mixed hyperlipidemia 03/25/2024   Status post surgery 03/25/2024   ICH (intracerebral hemorrhage) (HCC) 02/28/2024   Aortic stenosis    Carotid artery stenosis    Dizziness 09/02/2020   Tobacco abuse 09/02/2020   Parotid mass 09/02/2020   Atrial fibrillation (HCC)    Malignant neoplasm of prostate (HCC) 09/05/2016   Essential hypertension, benign 05/08/2013    Orientation RESPIRATION BLADDER Height & Weight     Self, Situation, Time, Place  Normal (Room Air) Incontinent Weight: 184 lb 1.4 oz (83.5 kg) Height:  6' 1 (185.4 cm)  BEHAVIORAL SYMPTOMS/MOOD NEUROLOGICAL BOWEL NUTRITION STATUS      Continent Diet (Please see discharge summary)  AMBULATORY STATUS COMMUNICATION OF NEEDS Skin   Limited Assist Verbally Wound Vac, Other (Comment), PU Stage and Appropriate Care (PICC; Negative Pressure Wound Therapy Leg Anterior Left; Elbow Left Posterior; Closed Surgical Incision Abdomen; Pressure  Injury Sacrum Mid Stage 2; Closed Surgical Incision Leg Left)                       Personal Care Assistance Level of Assistance  Bathing, Feeding, Dressing Bathing Assistance: Limited assistance Feeding assistance: Limited assistance Dressing Assistance: Limited assistance     Functional Limitations Info             SPECIAL CARE FACTORS FREQUENCY  PT (By licensed PT), OT (By licensed OT)     PT Frequency: 5x OT Frequency: 5x            Contractures Contractures Info: Not present    Additional Factors Info  Code Status, Allergies, Insulin  Sliding Scale Code Status Info: Full Code Allergies Info: Ace Inhibitors; Bactrim  Ds (sulfamethoxazole -trimethoprim ); Rythmol (propafenone); Nitrofurantoin   Insulin  Sliding Scale Info: Please see discharge summary       Current Medications (05/15/2024):  This is the current hospital active medication list Current Facility-Administered Medications  Medication Dose Route Frequency Provider Last Rate Last Admin   apixaban  (ELIQUIS ) tablet 5 mg  5 mg Oral BID Jens Durand, MD   5 mg at 05/15/24 1104   Chlorhexidine  Gluconate Cloth 2 % PADS 6 each  6 each Topical Daily Mdala-Gausi, Masiku Agatha, MD   6 each at 05/15/24 0940   clopidogrel  (PLAVIX ) tablet 75 mg  75 mg Oral Daily Bethanie Cough, PA-C   75 mg at 05/15/24 1104   feeding supplement (ENSURE PLUS HIGH PROTEIN) liquid 237 mL  237 mL Oral TID BM Jens Durand, MD   237 mL at 05/15/24  1110   HYDROmorphone  (DILAUDID ) injection 0.5-1 mg  0.5-1 mg Intravenous Q2H PRN Augustus Almarie RAMAN, PA-C       ipratropium (ATROVENT ) nebulizer solution 0.5 mg  0.5 mg Nebulization Q6H PRN Augustus Almarie RAMAN, PA-C       liver oil-zinc  oxide (DESITIN) 40 % ointment   Topical BID Simaan, Elizabeth S, PA-C   Given at 05/15/24 1110   methocarbamol  (ROBAXIN ) tablet 500 mg  500 mg Oral TID Gherghe, Costin M, MD   500 mg at 05/15/24 1104   metoprolol  succinate (TOPROL -XL) 24 hr tablet 25 mg  25  mg Oral Daily Ayiku, Bernard, MD   25 mg at 05/15/24 1104   multivitamin with minerals tablet 1 tablet  1 tablet Oral Daily Jens Durand, MD   1 tablet at 05/15/24 1104   ondansetron  (ZOFRAN ) injection 4 mg  4 mg Intravenous Q6H PRN Augustus Almarie RAMAN, PA-C       pantoprazole  (PROTONIX ) EC tablet 40 mg  40 mg Oral BID Ayiku, Bernard, MD   40 mg at 05/15/24 1104   polyethylene glycol (MIRALAX  / GLYCOLAX ) packet 17 g  17 g Oral Daily Simaan, Elizabeth S, PA-C       senna-docusate (Senokot-S) tablet 1 tablet  1 tablet Oral BID Simaan, Elizabeth S, PA-C   1 tablet at 05/15/24 1104   sodium chloride  flush (NS) 0.9 % injection 10-40 mL  10-40 mL Intracatheter Q12H Saintclair Jasper, MD   10 mL at 05/15/24 1110   sodium chloride  flush (NS) 0.9 % injection 10-40 mL  10-40 mL Intracatheter PRN Saintclair Jasper, MD   20 mL at 05/08/24 1757   sodium chloride  flush (NS) 0.9 % injection 3 mL  3 mL Intravenous Q12H Augustus Almarie RAMAN, PA-C   3 mL at 05/15/24 1110   sodium chloride  flush (NS) 0.9 % injection 3 mL  3 mL Intravenous Q12H Augustus Almarie RAMAN, PA-C   3 mL at 05/15/24 1110   sodium phosphate (FLEET) enema 1 enema  1 enema Rectal Once PRN Simaan, Elizabeth S, PA-C         Discharge Medications: Please see discharge summary for a list of discharge medications.  Relevant Imaging Results:  Relevant Lab Results:   Additional Information SSN [id number]  Kyle Kelley, LCSWA

## 2024-05-15 NOTE — Progress Notes (Signed)
 PROGRESS NOTE  DENO SIDA FMW:[id number] DOB: Apr 14, 1946 DOA: 05/03/2024 PCP: Charlott Dorn LABOR, MD   LOS: 12 days   Brief Narrative / Interim history: 78 year old male with history of A-fib, PAD with acute on chronic left lower extremity nonhealing wound, HTN, HLD, aortic stenosis, prior CVA, prostate cancer status post prostatectomy who came into the hospital for left lower extremity wound.  He had left limb ischemia in October 2025, underwent left lower extremity thrombectomy, endarterectomy with vein patch angioplasty.  He was followed as an outpatient on December 1 and was noted to have dehiscence of the wound and was admitted to the hospital, taken to the OR on 12/5 for debridement and placement of a wound VAC.  On admission he was also found to be anemic, with signs of a GI bleed, underwent an EGD on 12/6 which showed a duodenal ulcer.  Hospital course complicated by worsening abdominal pain, he was also was found to have perforated and was taken to the OR on 12/9 status post ex lap and Midwife.  Postop recovered well, was briefly on TPN, now back on regular food, TPN is off, and surgery signed off 12/16.  He remains with a wound VAC on the left lower extremity wound  Subjective / 24h Interval events: He is feeling well this morning, denies any chest pain, denies any shortness of breath.  Reports a good appetite  Assesement and Plan: Principal problem Perforated duodenal ulcer, acute GI bleed, acute blood loss anemia, duodenal ulcer -he was found to be anemic on admission, an EGD showed a duodenal ulcer.  Hospital course complicated by abdominal pain, leukocytosis, and further imaging showed the ulcers has perforated, and was taken to the OR on 12/9 status post ex lap and Midwife.  Postop recovered well, was briefly on TPN, now back on regular food, TPN is off, and surgery signed off 12/16.   - Hemoglobin has remained stable  Active problems PAD, left lower  limb ischemia October 2025 -initially status post left lower extremity thromboembolectomy on 10/27, postoperative course complicated by wound dehiscence status post new debridement on 12/5, now has a wound VAC in place.  Per vascular surgery, will change wound VAC tomorrow.  Once cleared by vascular surgery could be discharged home.  PT recommends SNF   PAF -continue Toprol , anticoagulation with Eliquis   Hypokalemia-continue to monitor and replace as indicated  Stage III sacral decubitus ulcer-POA, continue local wound care  Essential hypertension-continue metoprolol   History of CVA-currently on Eliquis   Acute hypoxic respiratory failure-now resolved, he is on room air  Scheduled Meds:  apixaban   5 mg Oral BID   Chlorhexidine  Gluconate Cloth  6 each Topical Daily   clopidogrel   75 mg Oral Daily   feeding supplement  237 mL Oral TID BM   liver oil-zinc  oxide   Topical BID   methocarbamol   500 mg Oral TID   metoprolol  succinate  25 mg Oral Daily   multivitamin with minerals  1 tablet Oral Daily   pantoprazole   40 mg Oral BID   polyethylene glycol  17 g Oral Daily   senna-docusate  1 tablet Oral BID   sodium chloride  flush  10-40 mL Intracatheter Q12H   sodium chloride  flush  3 mL Intravenous Q12H   sodium chloride  flush  3 mL Intravenous Q12H   Continuous Infusions: PRN Meds:.HYDROmorphone  (DILAUDID ) injection, ipratropium, [DISCONTINUED] ondansetron  **OR** ondansetron  (ZOFRAN ) IV, sodium chloride  flush, sodium phosphate  Current Outpatient Medications  Medication Instructions  apixaban  (ELIQUIS ) 5 mg, 2 times daily   diltiazem  (TIAZAC ) 300 mg, Daily   docusate sodium  (COLACE) 100 mg, Daily PRN   EPINEPHrine  (EPIPEN  IJ) 1 application , As needed   metoprolol  succinate (TOPROL -XL) 25 mg, Oral, Daily   polyethylene glycol powder (GLYCOLAX /MIRALAX ) 17 g, Oral, Daily PRN, Dissolve 1 capful (17g) in 4-8 ounces of liquid and take by mouth daily.   rosuvastatin  (CRESTOR ) 10 mg, Oral,  Daily   sulfamethoxazole -trimethoprim  (BACTRIM  DS) 800-160 MG tablet Take 1 tablet by mouth 2 (two) times daily for 14 days.   Diet Orders (From admission, onward)     Start     Ordered   05/13/24 0823  DIET SOFT Room service appropriate? Yes; Fluid consistency: Thin  Diet effective now       Question Answer Comment  Room service appropriate? Yes   Fluid consistency: Thin      05/13/24 9177            DVT prophylaxis: Place and maintain sequential compression device Start: 05/09/24 1216 SCDs Start: 05/03/24 1250 apixaban  (ELIQUIS ) tablet 5 mg   Lab Results  Component Value Date   PLT 196 05/13/2024      Code Status: Full Code  Family Communication: No family at bedside  Status is: Inpatient Remains inpatient appropriate because: Severity of illness   Level of care: Progressive  Consultants:  General Surgery Gastroenterology Vascular surgery  Objective: Vitals:   05/15/24 0300 05/15/24 0436 05/15/24 0441 05/15/24 0727  BP: 113/74   128/77  Pulse:    100  Resp: 13   19  Temp: 97.7 F (36.5 C)   97.7 F (36.5 C)  TempSrc: Oral   Oral  SpO2: 97%  96% 97%  Weight:  83.5 kg    Height:        Intake/Output Summary (Last 24 hours) at 05/15/2024 1043 Last data filed at 05/15/2024 0340 Gross per 24 hour  Intake 560 ml  Output 1025 ml  Net -465 ml   Wt Readings from Last 3 Encounters:  05/15/24 83.5 kg  04/29/24 77.6 kg  04/23/24 76.2 kg    Examination:  Constitutional: NAD Eyes: no scleral icterus ENMT: Mucous membranes are moist.  Neck: normal, supple Respiratory: clear to auscultation bilaterally, no wheezing, no crackles. Normal respiratory effort. No accessory muscle use.  Cardiovascular: Regular rate and rhythm, no murmurs / rubs / gallops. No LE edema.  Abdomen: non distended, no tenderness. Bowel sounds positive.  Musculoskeletal: no clubbing / cyanosis.  Skin: no rashes.  Left lower extremity wound VAC in place Neurologic: non  focal  Data Reviewed: I have independently reviewed following labs and imaging studies   CBC Recent Labs  Lab 05/09/24 0420 05/10/24 0610 05/11/24 0622 05/12/24 0552 05/13/24 0500  WBC 20.5* 14.6* 13.4* 12.3* 12.1*  HGB 8.5* 9.1* 9.1* 8.7* 8.5*  HCT 27.7* 28.7* 29.3* 28.1* 28.3*  PLT 162 142* 150 174 196  MCV 91.4 91.1 89.1 91.5 91.6  MCH 28.1 28.9 27.7 28.3 27.5  MCHC 30.7 31.7 31.1 31.0 30.0  RDW 17.1* 17.4* 17.4* 17.6* 17.9*    Recent Labs  Lab 05/08/24 1410 05/09/24 0420 05/09/24 0420 05/10/24 0610 05/11/24 0622 05/12/24 0552 05/13/24 0500 05/14/24 1159  NA  --  135   < > 134* 139 137 139 140  K  --  3.6   < > 3.4* 3.6 3.9 4.4 4.6  CL  --  98   < > 96* 99 100 104 105  CO2  --  34*   < > 31 31 30  32 30  GLUCOSE  --  126*   < > 139* 130* 125* 116* 77  BUN  --  18   < > 17 18 18 19 23   CREATININE  --  0.48*   < > 0.46* 0.51* 0.41* 0.41* 0.36*  CALCIUM   --  7.4*   < > 7.3* 7.8* 7.5* 7.5* 7.9*  AST  --  29  --   --   --   --  56*  --   ALT  --  22  --   --   --   --  70*  --   ALKPHOS  --  46  --   --   --   --  67  --   BILITOT  --  0.6  --   --   --   --  0.6  --   ALBUMIN  1.8* 1.6*  --   --   --   --  <1.5*  --   MG  --  1.9  --  1.8 2.0 2.1 2.1  --    < > = values in this interval not displayed.    ------------------------------------------------------------------------------------------------------------------ Recent Labs    05/13/24 0500  TRIG 34    Lab Results  Component Value Date   HGBA1C 5.7 (H) 02/29/2024   ------------------------------------------------------------------------------------------------------------------ No results for input(s): TSH, T4TOTAL, T3FREE, THYROIDAB in the last 72 hours.  Invalid input(s): FREET3  Cardiac Enzymes No results for input(s): CKMB, TROPONINI, MYOGLOBIN in the last 168 hours.  Invalid input(s):  CK ------------------------------------------------------------------------------------------------------------------    Component Value Date/Time   BNP 351.1 (H) 05/08/2024 0500    CBG: Recent Labs  Lab 05/13/24 1802 05/13/24 2336 05/14/24 0636 05/14/24 1552 05/14/24 2309  GLUCAP 148* 113* 104* 146* 99    Recent Results (from the past 240 hours)  Culture, blood (Routine X 2) w Reflex to ID Panel     Status: None   Collection Time: 05/06/24 11:47 PM   Specimen: BLOOD  Result Value Ref Range Status   Specimen Description BLOOD SITE NOT SPECIFIED  Final   Special Requests   Final    BOTTLES DRAWN AEROBIC AND ANAEROBIC Blood Culture adequate volume   Culture   Final    NO GROWTH 5 DAYS Performed at [facility], [address], [place], [place] [address]    Report Status 05/12/2024 FINAL  Final  Culture, blood (Routine X 2) w Reflex to ID Panel     Status: None   Collection Time: 05/06/24 11:47 PM   Specimen: BLOOD  Result Value Ref Range Status   Specimen Description BLOOD SITE NOT SPECIFIED  Final   Special Requests   Final    BOTTLES DRAWN AEROBIC AND ANAEROBIC Blood Culture adequate volume   Culture   Final    NO GROWTH 5 DAYS Performed at [facility], [address], [place], [place] [address]    Report Status 05/12/2024 FINAL  Final     Radiology Studies: No results found.   Nilda Fendt, MD, PhD [facility]  Between 7 am - 7 pm I am available, please contact me via Amion (for emergencies) or Securechat (non urgent messages)  Between 7 pm - 7 am I am not available, please contact night coverage MD/APP via Amion

## 2024-05-15 NOTE — TOC Progression Note (Signed)
 Transition of Care [facility] - Progression Note    Patient Details  Name: Kyle Kelley MRN: [id number] Date of Birth: 12-13-1945  Transition of Care [facility]) CM/SW Contact  [name], LCSWA Phone Number: 05/15/2024, 3:13 PM  Clinical Narrative:     3:13 PM CSW introduced self and role to patient. Patient's spouse, Kyle Kelley, was also present. CSW informed patient and Kyle Kelley of therapy's recommendation of patient discharging to SNF. Patient and Kyle Kelley were agreeable with recommendation and expressed interest in [facility] and [facility] ([place] and [place]). CSW sent patient's referral to SNFs in and near patient's county of residence. CSW will continue to follow.  Expected Discharge Plan: Skilled Nursing Facility Barriers to Discharge: SNF Pending bed offer               Expected Discharge Plan and Services In-house Referral: Clinical Social Work Discharge Planning Services: CM Consult Post Acute Care Choice: Skilled Nursing Facility Living arrangements for the past 2 months: Single Family Home                 DME Arranged: Vac DME Agency: [employer] Date DME Agency Contacted: 04/29/24   Representative spoke with at DME Agency: [facility] HH Arranged: RN HH Agency: [facility] Date HH Agency Contacted: 04/30/24 Time HH Agency Contacted: 1350 Representative spoke with at [facility] Agency: Research Scientist (physical Sciences)   Social Drivers of Health (SDOH) Interventions SDOH Screenings   Food Insecurity: No Food Insecurity (05/03/2024)  Housing: Unknown (05/03/2024)  Transportation Needs: No Transportation Needs (05/03/2024)  Utilities: Not At Risk (05/03/2024)  Social Connections: Moderately Integrated (05/03/2024)  Tobacco Use: Medium Risk (05/07/2024)    Readmission Risk Interventions    05/06/2024   12:43 PM  Readmission Risk Prevention Plan  Transportation Screening Complete  PCP or Specialist Appt within 3-5 Days Complete  HRI or Home Care Consult Complete  Social Work Consult for  Recovery Care Planning/Counseling Complete  Palliative Care Screening Complete  Medication Review Oceanographer) Referral to Pharmacy

## 2024-05-15 NOTE — Progress Notes (Signed)
 Physical Therapy Treatment Patient Details Name: ALGIS LEHENBAUER MRN: [id number] DOB: January 09, 1946 Today's Date: 05/15/2024   History of Present Illness ARLINGTON SIGMUND is a 78 y.o. male admitted 05/03/24 with symptomatic anemia. He has been followed closely by vascular team outpatient and at visit 12/1 noted worsening wound dehiscence brought in 12/5 for debridement in OR and placement of wound VAC. Pt underwent EGD 12/6, which showed  gastritis, duodenal ulcers clean base nonbleeding, random gastric biopsies r/o h-pylori. PMHx: atrial fibrillation (on Eliquis /recently held), vasculopath, acute on chronic ischemic LLE nonhealing wound, HTN, HLD, aortic stenosis, CVA, prostate cancer s/p prostatectomy 2014, and PVD.    PT Comments  Pt resting in bed on arrival, pleasant and eager for mobility. Pt demonstrating steady progress towards acute goals this session, however continues to be limited in safe mobility by decreased activity tolerance, global weakness and impaired balance/postural reactions. Pt coming to sit EOB with CGA with use of bed features. Pt requiring mod A to stand from EOB with pt needing x2 attempts to complete. Pt progressing ambulation with RW for support and CGA x2 with chair follow needed for safety as pt fatigues quickly needing seated recovery break. Discussed with supervising PT and updated recommendation as patient will benefit from continued inpatient follow up therapy, <3 hours/day to address deficits and maximize functional independence and decrease caregiver burden. Will continue to follow acutely.    If plan is discharge home, recommend the following: A little help with walking and/or transfers;A little help with bathing/dressing/bathroom;Assistance with cooking/housework;Assist for transportation;Help with stairs or ramp for entrance   Can travel by private vehicle     Yes  Equipment Recommendations  None recommended by PT    Recommendations for Other Services        Precautions / Restrictions Precautions Precautions: Fall Recall of Precautions/Restrictions: Intact Precaution/Restrictions Comments: wound vac LLE Restrictions Weight Bearing Restrictions Per Provider Order: No     Mobility  Bed Mobility Overal bed mobility: Needs Assistance Bed Mobility: Supine to Sit     Supine to sit: Contact guard, HOB elevated, Used rails     General bed mobility comments: increased time and use of bed features but no physical assist needed    Transfers Overall transfer level: Needs assistance Equipment used: Rolling walker (2 wheels) Transfers: Sit to/from Stand, Bed to chair/wheelchair/BSC Sit to Stand: Min assist, Mod assist   Step pivot transfers: Contact guard assist       General transfer comment: mod A to stand from slightly elevated EOB and min A from BSC,once standing CGA to step pivot EOB>BSC    Ambulation/Gait Ambulation/Gait assistance: Contact guard assist, +2 safety/equipment (+2 for chair follow) Gait Distance (Feet): 50 Feet (x2) Assistive device: Rolling walker (2 wheels) Gait Pattern/deviations: Step-through pattern, Decreased stride length, Trunk flexed Gait velocity: decr     General Gait Details: low shuffling steps with flexed posture and knees slightly flexed in stance, cues for closer RW proximity with improvement in posture. chair for safety as pt fatigues quickly   Stairs             Wheelchair Mobility     Tilt Bed    Modified Rankin (Stroke Patients Only)       Balance Overall balance assessment: Needs assistance Sitting-balance support: Bilateral upper extremity supported, Feet supported Sitting balance-Leahy Scale: Fair Sitting balance - Comments: EOB   Standing balance support: Single extremity supported, Bilateral upper extremity supported, During functional activity Standing balance-Leahy Scale: Poor Standing balance comment: able  to stand with one extremity support during grooming tasks,  dependent on external support when standing                            Communication Communication Communication: Impaired Factors Affecting Communication: Hearing impaired  Cognition Arousal: Alert Behavior During Therapy: Flat affect                             Following commands: Impaired Following commands impaired: Follows one step commands inconsistently    Cueing Cueing Techniques: Verbal cues, Gestural cues  Exercises      General Comments General comments (skin integrity, edema, etc.): HR upper 90s-126bpm with activity, SpO2>90% on RA      Pertinent Vitals/Pain Pain Assessment Pain Assessment: Faces Faces Pain Scale: Hurts a little bit Pain Location: abdomen Pain Descriptors / Indicators: Sore Pain Intervention(s): Monitored during session, Limited activity within patient's tolerance    Home Living                          Prior Function            PT Goals (current goals can now be found in the care plan section) Acute Rehab PT Goals PT Goal Formulation: With patient/family Time For Goal Achievement: 05/19/24 Progress towards PT goals: Progressing toward goals    Frequency    Min 2X/week      PT Plan      Co-evaluation              AM-PAC PT 6 Clicks Mobility   Outcome Measure  Help needed turning from your back to your side while in a flat bed without using bedrails?: A Little Help needed moving from lying on your back to sitting on the side of a flat bed without using bedrails?: A Little Help needed moving to and from a bed to a chair (including a wheelchair)?: A Little Help needed standing up from a chair using your arms (e.g., wheelchair or bedside chair)?: A Little Help needed to walk in hospital room?: A Lot Help needed climbing 3-5 steps with a railing? : A Lot 6 Click Score: 16    End of Session   Activity Tolerance: Patient tolerated treatment well Patient left: in chair;with call  bell/phone within reach Nurse Communication: Mobility status PT Visit Diagnosis: Difficulty in walking, not elsewhere classified (R26.2);Other abnormalities of gait and mobility (R26.89)     Time: 9151-9084 PT Time Calculation (min) (ACUTE ONLY): 27 min  Charges:    $Gait Training: 8-22 mins $Therapeutic Activity: 8-22 mins PT General Charges $$ ACUTE PT VISIT: 1 Visit                     Marriana Hibberd R. PTA Acute Rehabilitation Services Office: [phone]   Therisa CHRISTELLA Boor 05/15/2024, 10:29 AM

## 2024-05-15 NOTE — Progress Notes (Signed)
 Mobility Specialist Progress Note:    05/15/24 1400  Mobility  Activity Pivoted/transferred from chair to bed  Level of Assistance Moderate assist, patient does 50-74%  Assistive Device Front wheel walker  Distance Ambulated (ft) 2 ft  Activity Response Tolerated well  Mobility Referral Yes  Mobility visit 1 Mobility  Mobility Specialist Start Time (ACUTE ONLY) 1313  Mobility Specialist Stop Time (ACUTE ONLY) 1327  Mobility Specialist Time Calculation (min) (ACUTE ONLY) 14 min   Pt received in chair agreeable to mobility. Required ModA for STS and contact guard during ambulation. Was able to take a couple steps towards the bed w/o fault. Assisted pt to supine in bed, w/ call bell and personal belongings in reach. All needs met.   Thersia Minder Mobility Specialist  Please contact vis Secure Chat or  Rehab Office [phone]

## 2024-05-15 NOTE — Progress Notes (Signed)
 Nutrition Follow-up  DOCUMENTATION CODES:   Severe malnutrition in context of acute illness/injury  INTERVENTION:  Continue Ensure Plus High Protein po TID, each supplement provides 350 kcal and 20 grams of protein. Continue MVI w/ minerals Add Magic cup TID with meals, each supplement provides 290 kcal and 9 grams of protein    NUTRITION DIAGNOSIS:  Severe Malnutrition related to acute illness as evidenced by moderate fat depletion, moderate muscle depletion, severe muscle depletion.  GOAL:  Patient will meet greater than or equal to 90% of their needs  MONITOR:  Diet advancement, Labs, I & O's, Weight trends  REASON FOR ASSESSMENT:  Consult New TPN/TNA  ASSESSMENT:   Pt with PMH significant for: afib, aortic stenosis, CVA, prostate cancer s/p prostatectomy (2014) and radiation therapy, sigmoid diverticulosis, HTN, and dyslipidemia. Has acute on chronic ischemic left lower extremity nonhealing wound. Presented to 12/05 for vascular r/t LLE wound for debridement and wound vac placement. Developed severe anemia (Hgb 6.2) post op and admitted for evaluation and was transfused. GI consulted and he underwent EGD on 12/06. He developed persistent abdominal pain post procedure and suspected pneumoperitoneum 2/2 perforated duodenal ulcer. Emergently taken to OR for exlap.  12/05 - admitted; clear liquid diet; transfused 2 units PRBCs 12/06 - NPO; EGD: non-bleeding duodenal ulcer; adv GI soft diet 12/07 - persistent abd pain post procedure 12/08 - CT A/P: pneumoperitoneum; NPO 12/09 - OR: exlap w/ graham patch repair 2/2 perforated duodenal ulcer; TPN started 12/14- diet advanced clear liquids 12/15- Soft diet with Ensure Plus High Protein BID; TPN starting to be weaned   Patient tolerating advanced diet. Currently on GI soft diet. General surgery has signed off. TPN stopped yesterday. JP drain has been removed.    Average Meal Intake 12/14: 50% x2 documented meals 12/15: 50% x1  documented meal 12/16: 75-100% x2 documented meals  Intake improving. Bowels have moved. He endorses no significant pain or N/V. He does not consume ONS at baseline, but is currently taking here. Encourage continued ONS intake to augment PO intake.  No reported issues chewing or swallowing.  Discussed importance of continued adequate intake in the presence of elevated estimated calorie/protein needs 2/2 wound healing. He verbalized understanding. Will monitor for ability to further advance diet.    Admit Weight: 77.6 kg (171 lbs)  Current Weight: 83.5 kg (184 lbs)  Usual weight: 171 lbs per patient, 161 lbs per wife    Remains above reported UBW. Has significant edema to BLEs from his feed up to his thigh. Continue to encourage protein intake in an effort to revascularize fluid in the interstitial space. Notably, hypoalbuminemia likely reflective of inflammatory process, acute stress response, and volume overload. Will likely see additional weight loss as this resolves, however this is not necessarily related to poor intake, but reduction in volume overload.    Drain/Lines: R brachial: PICC, double lumen LLE: wound vac: 0mL output documented UOP: 975 ml x24 hours    Meds: MVI, Miralax , senna-docusate   BUN has stabilized within desirable range. Crt low likely d/t low muscle mass. PHOS has been repleted.   Labs from 12/16 reviewed:  Na+ 140 (wdl) K+ 4.6 (wdl) BUN 23 Crt 0.36 PHOS 2.5 (wdl) Mg 2.1 (wdl) WBC 12.1 (H) CBGs 77-116 x24 hours A1c 5.7 (02/2024)  Diet Order:   Diet Order             DIET SOFT Room service appropriate? Yes; Fluid consistency: Thin  Diet effective now  EDUCATION NEEDS:   No education needs have been identified at this time  Skin:  Skin Assessment: Skin Integrity Issues: Skin Integrity Issues:: Stage II Stage II: sacrum  Last BM:  12/16 - type 6 x1  Height:   Ht Readings from Last 1 Encounters:  05/04/24 6' 1 (1.854 m)     Weight:   Wt Readings from Last 1 Encounters:  05/15/24 83.5 kg    Ideal Body Weight:  83.6 kg  BMI:  Body mass index is 24.29 kg/m.  Estimated Nutritional Needs:   Kcal:  2100-2300 kcals  Protein:  100-115g  Fluid:  >2L/day  Blair Deaner MS, RD, LDN Registered Dietitian Clinical Nutrition RD Inpatient Contact Info in Amion

## 2024-05-15 NOTE — Plan of Care (Signed)
  Problem: Clinical Measurements: Goal: Will remain free from infection Outcome: Progressing Goal: Diagnostic test results will improve Outcome: Progressing Goal: Respiratory complications will improve Outcome: Progressing Goal: Cardiovascular complication will be avoided Outcome: Progressing   Problem: Nutrition: Goal: Adequate nutrition will be maintained Outcome: Progressing   Problem: Coping: Goal: Level of anxiety will decrease Outcome: Progressing

## 2024-05-15 NOTE — Progress Notes (Addendum)
 Assessed pt RLE (+4 pitting edema) & RUE (+3 pitting edema) accompanied with weeping. LLE has a continuous wound vac, no complications noted. Strong pulses assessed with less than 3 second capillary refill. Attempted to notify Howerter, MD of edema assessed on right side of pt's body. See new orders.  Lonell LITTIE Lyme, RN

## 2024-05-15 NOTE — Plan of Care (Signed)
  Problem: Education: Goal: Knowledge of General Education information will improve Description: Including pain rating scale, medication(s)/side effects and non-pharmacologic comfort measures Outcome: Progressing   Problem: Health Behavior/Discharge Planning: Goal: Ability to manage health-related needs will improve Outcome: Progressing   Problem: Clinical Measurements: Goal: Respiratory complications will improve Outcome: Progressing Goal: Cardiovascular complication will be avoided Outcome: Progressing   Problem: Nutrition: Goal: Adequate nutrition will be maintained Outcome: Progressing   Problem: Elimination: Goal: Will not experience complications related to bowel motility Outcome: Progressing Goal: Will not experience complications related to urinary retention Outcome: Progressing   Problem: Pain Managment: Goal: General experience of comfort will improve and/or be controlled Outcome: Progressing

## 2024-05-16 ENCOUNTER — Inpatient Hospital Stay (HOSPITAL_COMMUNITY)

## 2024-05-16 DIAGNOSIS — R609 Edema, unspecified: Secondary | ICD-10-CM | POA: Diagnosis not present

## 2024-05-16 DIAGNOSIS — D649 Anemia, unspecified: Secondary | ICD-10-CM | POA: Diagnosis not present

## 2024-05-16 NOTE — Progress Notes (Signed)
 Occupational Therapy Treatment Patient Details Name: Kyle Kelley MRN: [id number] DOB: 11/26/1945 Today's Date: 05/16/2024   History of present illness Kyle Kelley is a 78 y.o. male admitted 05/03/24 with symptomatic anemia. He has been followed closely by vascular team outpatient and at visit 12/1 noted worsening wound dehiscence brought in 12/5 for debridement in OR and placement of wound VAC. Pt underwent EGD 12/6, which showed  gastritis, duodenal ulcers clean base nonbleeding, random gastric biopsies r/o h-pylori. PMHx: atrial fibrillation (on Eliquis /recently held), vasculopath, acute on chronic ischemic LLE nonhealing wound, HTN, HLD, aortic stenosis, CVA, prostate cancer s/p prostatectomy 2014, and PVD.   OT comments  Patient making good gains with OT treatment with CGA to get to EOB and min/CGA to stand from raised bed. Patient stood at sink for grooming tasks and asked to use BSC.  Patient requiring assistance to complete toilet hygiene and able to ambulate to recliner with CGA. Patient will benefit from continued inpatient follow up therapy, <3 hours/day to increase functional strength before return home with wife.  Acute OT to continue to follow to address established goals to facilitate DC to next venue of care.        If plan is discharge home, recommend the following:  A little help with walking and/or transfers;A little help with bathing/dressing/bathroom;Assistance with cooking/housework;Assist for transportation;Help with stairs or ramp for entrance   Equipment Recommendations  None recommended by OT    Recommendations for Other Services      Precautions / Restrictions Precautions Precautions: Fall Recall of Precautions/Restrictions: Intact Precaution/Restrictions Comments: wound vac LLE Restrictions Weight Bearing Restrictions Per Provider Order: No       Mobility Bed Mobility Overal bed mobility: Needs Assistance Bed Mobility: Supine to Sit     Supine to  sit: Contact guard, HOB elevated, Used rails     General bed mobility comments: increased time and use of bed rails    Transfers Overall transfer level: Needs assistance Equipment used: Rolling walker (2 wheels) Transfers: Sit to/from Stand, Bed to chair/wheelchair/BSC Sit to Stand: Min assist, Contact guard assist           General transfer comment: min to CGA to stand from raised bed and min assist from [facility]     Balance Overall balance assessment: Needs assistance Sitting-balance support: Bilateral upper extremity supported, Feet supported Sitting balance-Leahy Scale: Fair Sitting balance - Comments: EOB   Standing balance support: Single extremity supported, Bilateral upper extremity supported, During functional activity Standing balance-Leahy Scale: Poor Standing balance comment: stood at sink for grooming tasks with one extemity support                           ADL either performed or assessed with clinical judgement   ADL Overall ADL's : Needs assistance/impaired     Grooming: Wash/dry hands;Wash/dry face;Oral care;Contact guard assist;Standing Grooming Details (indicate cue type and reason): at sink Upper Body Bathing: Supervision/ safety;Sitting Upper Body Bathing Details (indicate cue type and reason): at sink     Upper Body Dressing : Set up       Toilet Transfer: Contact guard assist;BSC/3in1;Rolling walker (2 wheels)   Toileting- Clothing Manipulation and Hygiene: Minimal assistance;Sit to/from stand Toileting - Clothing Manipulation Details (indicate cue type and reason): min assist to complete            Extremity/Trunk Assessment              Vision  Perception     Praxis     Communication Communication Communication: Impaired Factors Affecting Communication: Hearing impaired   Cognition Arousal: Alert Behavior During Therapy: Flat affect Cognition: No apparent impairments                                Following commands: Impaired Following commands impaired: Follows one step commands inconsistently      Cueing   Cueing Techniques: Verbal cues, Gestural cues  Exercises      Shoulder Instructions       General Comments HR increased to 120's while standing at sink    Pertinent Vitals/ Pain       Pain Assessment Pain Assessment: Faces Faces Pain Scale: Hurts a little bit Pain Location: abdomen Pain Descriptors / Indicators: Sore Pain Intervention(s): Monitored during session, Repositioned  Home Living                                          Prior Functioning/Environment              Frequency  Min 2X/week        Progress Toward Goals  OT Goals(current goals can now be found in the care plan section)  Progress towards OT goals: Progressing toward goals     Plan      Co-evaluation                 AM-PAC OT 6 Clicks Daily Activity     Outcome Measure   Help from another person eating meals?: None Help from another person taking care of personal grooming?: A Little Help from another person toileting, which includes using toliet, bedpan, or urinal?: A Lot Help from another person bathing (including washing, rinsing, drying)?: A Lot Help from another person to put on and taking off regular upper body clothing?: A Little Help from another person to put on and taking off regular lower body clothing?: A Lot 6 Click Score: 16    End of Session Equipment Utilized During Treatment: Gait belt;Rolling walker (2 wheels)  OT Visit Diagnosis: Unsteadiness on feet (R26.81);Other abnormalities of gait and mobility (R26.89);Muscle weakness (generalized) (M62.81)   Activity Tolerance Patient tolerated treatment well   Patient Left in chair;with call bell/phone within reach;with chair alarm set;with family/visitor present   Nurse Communication Mobility status        Time: 9183-9155 OT Time Calculation (min): 28 min  Charges: OT  General Charges $OT Visit: 1 Visit OT Treatments $Self Care/Home Management : 23-37 mins  Kyle Kelley, OTA Acute Rehabilitation Services  Office [phone]   Kyle Kelley 05/16/2024, 10:43 AM

## 2024-05-16 NOTE — Progress Notes (Signed)
 PROGRESS NOTE  Kyle Kelley:[id number] DOB: 04-23-1946 DOA: 05/03/2024 PCP: Charlott Dorn LABOR, MD   LOS: 13 days   Brief Narrative / Interim history: 78 year old male with history of A-fib, PAD with acute on chronic left lower extremity nonhealing wound, HTN, HLD, aortic stenosis, prior CVA, prostate cancer status post prostatectomy who came into the hospital for left lower extremity wound.  He had left limb ischemia in October 2025, underwent left lower extremity thrombectomy, endarterectomy with vein patch angioplasty.  He was followed as an outpatient on December 1 and was noted to have dehiscence of the wound and was admitted to the hospital, taken to the OR on 12/5 for debridement and placement of a wound VAC.  On admission he was also found to be anemic, with signs of a GI bleed, underwent an EGD on 12/6 which showed a duodenal ulcer.  Hospital course complicated by worsening abdominal pain, he was also was found to have perforated and was taken to the OR on 12/9 status post ex lap and Midwife.  Postop recovered well, was briefly on TPN, now back on regular food, TPN is off, and surgery signed off 12/16.  He remains with a wound VAC on the left lower extremity wound  Subjective / 24h Interval events: Doing well this morning, eating breakfast.  Denies any abdominal pain, nausea or vomiting.  Wife is at bedside.  Vascular surgery just changed the dressing and the wound VAC on the left lower extremity wound  Assesement and Plan: Principal problem Perforated duodenal ulcer, acute GI bleed, acute blood loss anemia, duodenal ulcer -he was found to be anemic on admission, an EGD showed a duodenal ulcer.  Hospital course complicated by abdominal pain, leukocytosis, and further imaging showed the ulcers has perforated, and was taken to the OR on 12/9 status post ex lap and Midwife.  Postop recovered well, was briefly on TPN, now back on regular food, TPN is off, and  surgery signed off 12/16.   - Hemoglobin has remained stable, tolerating a regular diet  Active problems PAD, left lower limb ischemia October 2025 -initially status post left lower extremity thromboembolectomy on 10/27, postoperative course complicated by wound dehiscence status post new debridement on 12/5, now has a wound VAC in place.  Wound VAC changed this morning, discussed with Dr. Gari, wound looks good and from vascular standpoint could discharge on portable wound VAC with outpatient follow-up in a week.  SNF pending  PAF -continue Toprol , anticoagulation with Eliquis   Hypokalemia-continue to monitor and replace as indicated  Stage III sacral decubitus ulcer-POA, continue local wound care  Essential hypertension-continue metoprolol   History of CVA-currently on Eliquis   Acute hypoxic respiratory failure-now resolved, he is on room air  Scheduled Meds:  apixaban   5 mg Oral BID   Chlorhexidine  Gluconate Cloth  6 each Topical Daily   clopidogrel   75 mg Oral Daily   feeding supplement  237 mL Oral TID BM   liver oil-zinc  oxide   Topical BID   methocarbamol   500 mg Oral TID   metoprolol  succinate  25 mg Oral Daily   multivitamin with minerals  1 tablet Oral Daily   pantoprazole   40 mg Oral BID   polyethylene glycol  17 g Oral Daily   senna-docusate  1 tablet Oral BID   sodium chloride  flush  10-40 mL Intracatheter Q12H   sodium chloride  flush  3 mL Intravenous Q12H   sodium chloride  flush  3 mL Intravenous Q12H  Continuous Infusions: PRN Meds:.HYDROmorphone  (DILAUDID ) injection, ipratropium, [DISCONTINUED] ondansetron  **OR** ondansetron  (ZOFRAN ) IV, sodium chloride  flush, sodium phosphate  Current Outpatient Medications  Medication Instructions   apixaban  (ELIQUIS ) 5 mg, 2 times daily   diltiazem  (TIAZAC ) 300 mg, Daily   docusate sodium  (COLACE) 100 mg, Daily PRN   EPINEPHrine  (EPIPEN  IJ) 1 application , As needed   metoprolol  succinate (TOPROL -XL) 25 mg, Oral, Daily    polyethylene glycol powder (GLYCOLAX /MIRALAX ) 17 g, Oral, Daily PRN, Dissolve 1 capful (17g) in 4-8 ounces of liquid and take by mouth daily.   rosuvastatin  (CRESTOR ) 10 mg, Oral, Daily   sulfamethoxazole -trimethoprim  (BACTRIM  DS) 800-160 MG tablet Take 1 tablet by mouth 2 (two) times daily for 14 days.   Diet Orders (From admission, onward)     Start     Ordered   05/13/24 0823  DIET SOFT Room service appropriate? Yes; Fluid consistency: Thin  Diet effective now       Question Answer Comment  Room service appropriate? Yes   Fluid consistency: Thin      05/13/24 9177            DVT prophylaxis: Place and maintain sequential compression device Start: 05/09/24 1216 SCDs Start: 05/03/24 1250 apixaban  (ELIQUIS ) tablet 5 mg   Lab Results  Component Value Date   PLT 196 05/13/2024      Code Status: Full Code  Family Communication: Wife present at bedside  Status is: Inpatient Remains inpatient appropriate because: Severity of illness   Level of care: Progressive  Consultants:  General Surgery Gastroenterology Vascular surgery  Objective: Vitals:   05/15/24 2307 05/16/24 0320 05/16/24 0500 05/16/24 0926  BP: 115/67 110/70  116/62  Pulse:    (!) 106  Resp: 20 16    Temp: 98.1 F (36.7 C) 97.8 F (36.6 C)    TempSrc: Oral Oral    SpO2: 97% 90%    Weight:   82.1 kg   Height:        Intake/Output Summary (Last 24 hours) at 05/16/2024 1020 Last data filed at 05/16/2024 0430 Gross per 24 hour  Intake --  Output 1050 ml  Net -1050 ml   Wt Readings from Last 3 Encounters:  05/16/24 82.1 kg  04/29/24 77.6 kg  04/23/24 76.2 kg    Examination:  Constitutional: NAD Eyes: lids and conjunctivae normal, no scleral icterus ENMT: mmm Neck: normal, supple Respiratory: clear to auscultation bilaterally, no wheezing, no crackles. Normal respiratory effort.  Cardiovascular: Regular rate and rhythm, no murmurs / rubs / gallops. No LE edema. Abdomen: soft, no  distention, no tenderness. Bowel sounds positive.   Data Reviewed: I have independently reviewed following labs and imaging studies   CBC Recent Labs  Lab 05/10/24 0610 05/11/24 0622 05/12/24 0552 05/13/24 0500  WBC 14.6* 13.4* 12.3* 12.1*  HGB 9.1* 9.1* 8.7* 8.5*  HCT 28.7* 29.3* 28.1* 28.3*  PLT 142* 150 174 196  MCV 91.1 89.1 91.5 91.6  MCH 28.9 27.7 28.3 27.5  MCHC 31.7 31.1 31.0 30.0  RDW 17.4* 17.4* 17.6* 17.9*    Recent Labs  Lab 05/10/24 0610 05/11/24 0622 05/12/24 0552 05/13/24 0500 05/14/24 1159  NA 134* 139 137 139 140  K 3.4* 3.6 3.9 4.4 4.6  CL 96* 99 100 104 105  CO2 31 31 30  32 30  GLUCOSE 139* 130* 125* 116* 77  BUN 17 18 18 19 23   CREATININE 0.46* 0.51* 0.41* 0.41* 0.36*  CALCIUM  7.3* 7.8* 7.5* 7.5* 7.9*  AST  --   --   --  56*  --   ALT  --   --   --  70*  --   ALKPHOS  --   --   --  67  --   BILITOT  --   --   --  0.6  --   ALBUMIN   --   --   --  <1.5*  --   MG 1.8 2.0 2.1 2.1  --     ------------------------------------------------------------------------------------------------------------------ No results for input(s): CHOL, HDL, LDLCALC, TRIG, CHOLHDL, LDLDIRECT in the last 72 hours.   Lab Results  Component Value Date   HGBA1C 5.7 (H) 02/29/2024   ------------------------------------------------------------------------------------------------------------------ No results for input(s): TSH, T4TOTAL, T3FREE, THYROIDAB in the last 72 hours.  Invalid input(s): FREET3  Cardiac Enzymes No results for input(s): CKMB, TROPONINI, MYOGLOBIN in the last 168 hours.  Invalid input(s): CK ------------------------------------------------------------------------------------------------------------------    Component Value Date/Time   BNP 351.1 (H) 05/08/2024 0500    CBG: Recent Labs  Lab 05/13/24 1802 05/13/24 2336 05/14/24 0636 05/14/24 1552 05/14/24 2309  GLUCAP 148* 113* 104* 146* 99    Recent  Results (from the past 240 hours)  Culture, blood (Routine X 2) w Reflex to ID Panel     Status: None   Collection Time: 05/06/24 11:47 PM   Specimen: BLOOD  Result Value Ref Range Status   Specimen Description BLOOD SITE NOT SPECIFIED  Final   Special Requests   Final    BOTTLES DRAWN AEROBIC AND ANAEROBIC Blood Culture adequate volume   Culture   Final    NO GROWTH 5 DAYS Performed at [facility], [address], [place], [place] [address]    Report Status 05/12/2024 FINAL  Final  Culture, blood (Routine X 2) w Reflex to ID Panel     Status: None   Collection Time: 05/06/24 11:47 PM   Specimen: BLOOD  Result Value Ref Range Status   Specimen Description BLOOD SITE NOT SPECIFIED  Final   Special Requests   Final    BOTTLES DRAWN AEROBIC AND ANAEROBIC Blood Culture adequate volume   Culture   Final    NO GROWTH 5 DAYS Performed at [facility], [address], [place], [place] [address]    Report Status 05/12/2024 FINAL  Final     Radiology Studies: No results found.   Nilda Fendt, MD, PhD [facility]  Between 7 am - 7 pm I am available, please contact me via Amion (for emergencies) or Securechat (non urgent messages)  Between 7 pm - 7 am I am not available, please contact night coverage MD/APP via Amion

## 2024-05-16 NOTE — Progress Notes (Signed)
 VASCULAR LAB    Right lower extremity venous duplex has been performed.  See CV proc for preliminary results.   Breeze Berringer, RVT 05/16/2024, 12:01 PM

## 2024-05-16 NOTE — Progress Notes (Addendum)
°  Progress Note    05/16/2024 8:19 AM 9 Days Post-Op  Subjective:  no complaints   Vitals:   05/15/24 2307 05/16/24 0320  BP: 115/67 110/70  Pulse:    Resp: 20 16  Temp: 98.1 F (36.7 C) 97.8 F (36.6 C)  SpO2: 97% 90%   Physical Exam: Lungs:  non labored Incisions:  wound healing well Extremities:  L foot warm with motor and sensation Neurologic: A&O    CBC    Component Value Date/Time   WBC 12.1 (H) 05/13/2024 0500   RBC 3.09 (L) 05/13/2024 0500   HGB 8.5 (L) 05/13/2024 0500   HCT 28.3 (L) 05/13/2024 0500   PLT 196 05/13/2024 0500   MCV 91.6 05/13/2024 0500   MCH 27.5 05/13/2024 0500   MCHC 30.0 05/13/2024 0500   RDW 17.9 (H) 05/13/2024 0500   LYMPHSABS 1.0 03/26/2024 0640   MONOABS 1.0 03/26/2024 0640   EOSABS 0.0 03/26/2024 0640   BASOSABS 0.0 03/26/2024 0640    BMET    Component Value Date/Time   NA 140 05/14/2024 1159   K 4.6 05/14/2024 1159   CL 105 05/14/2024 1159   CO2 30 05/14/2024 1159   GLUCOSE 77 05/14/2024 1159   BUN 23 05/14/2024 1159   CREATININE 0.36 (L) 05/14/2024 1159   CALCIUM  7.9 (L) 05/14/2024 1159   GFRNONAA >60 05/14/2024 1159   GFRAA >90 11/01/2012 0522    INR    Component Value Date/Time   INR 1.2 05/04/2024 0550     Intake/Output Summary (Last 24 hours) at 05/16/2024 0819 Last data filed at 05/16/2024 0430 Gross per 24 hour  Intake --  Output 1050 ml  Net -1050 ml     Assessment/Plan:  78 y.o. male is s/p  debridement of L below knee postsurgical wound  9 Days Post-Op   LLE well perfused on exam.  Vac changed at the bedside.  Wound seems to be making progress.  Next vac change on Monday or Tuesday if still here.   Donnice Sender, PA-C [facility] [phone] 05/16/2024 8:19 AM  I have independently interviewed and examined patient and agree with PA assessment and plan above.  Okay for discharge from vascular standpoint with wound VAC changes and he has follow-up with us  in 2 weeks.   Continue Plavix  for recent stenting and Eliquis  for atrial fibrillation.  Bonniejean Piano C. Sheree, MD [facility] [place] Office: [phone] Pager: [phone]

## 2024-05-16 NOTE — Progress Notes (Signed)
 Mobility Specialist Progress Note:   05/16/24 1105  Mobility  Activity Ambulated with assistance  Level of Assistance Minimal assist, patient does 75% or more  Assistive Device Front wheel walker  Distance Ambulated (ft) 30 ft  Activity Response Tolerated well  Mobility Referral Yes  Mobility visit 1 Mobility  Mobility Specialist Start Time (ACUTE ONLY) 1052  Mobility Specialist Stop Time (ACUTE ONLY) 1105  Mobility Specialist Time Calculation (min) (ACUTE ONLY) 13 min   Pt received in chair agreeable to mobility. MinA +2 for STS. First only wanted to get back to bed d/t fatigue but was agreeable to ambulate around room no c/o throughout. Situated back in bed w/ call bell and personal belongings in reach. All needs met.   Thersia Minder Mobility Specialist  Please contact vis Secure Chat or  Rehab Office [phone]

## 2024-05-16 NOTE — Progress Notes (Signed)
 TR[facility] night cross cover note:   I have ordered venous ultrasound of the right upper and right lower extremities to assess for DVT in the setting of reported disproportionate edema in the patient's right upper and right lower extremities relative to his left side.  He is reported to have a history of DVT.  Noted to be on Eliquis  in the setting of a history of paroxysmal atrial fibrillation.     Eva Pore, DO Hospitalist

## 2024-05-16 NOTE — Progress Notes (Signed)
 VASCULAR LAB    Right upper extremity venous duplex has been performed.  See CV proc for preliminary results.   Von Quintanar, RVT 05/16/2024, 12:01 PM

## 2024-05-16 NOTE — TOC Progression Note (Addendum)
 Transition of Care [facility]) - Progression Note    Patient Details  Name: Kyle Kelley MRN: [id number] Date of Birth: 01-18-1946  Transition of [facility] CM/SW Contact  [name], LCSWA Phone Number: 05/16/2024, 2:29 PM  Clinical Narrative:     2:29 PM CSW informed patient and patient's spouse, Kyle Kelley SNF bed denial and [facility] bed offer. Patient and Kyle accepted bed offer at [facility] and declined CSW offer of reviewing other SNF bed offers. CSW informed SNF of bed acceptance and potential discharge tomorrow, per MD. SNF informed CSW that they would not have a bed available until Monday. CSW made medical team aware. CSW will continue to follow.  Expected Discharge Plan: Skilled Nursing Facility Barriers to Discharge: Continued Medical Work up               Expected Discharge Plan and Services In-house Referral: Clinical Social Work Discharge Planning Services: CM Consult Post Acute Care Choice: Skilled Nursing Facility Living arrangements for the past 2 months: Single Family Home                 DME Arranged: Vac DME Agency: [facility] Date DME Agency Contacted: 04/29/24   Representative spoke with at DME Agency: [facility] HH Arranged: RN HH Agency: [facility] Date HH Agency Contacted: 04/30/24 Time HH Agency Contacted: 1350 Representative spoke with at [facility] Agency: Research Scientist (physical Sciences)   Social Drivers of Health (SDOH) Interventions SDOH Screenings   Food Insecurity: No Food Insecurity (05/03/2024)  Housing: Unknown (05/03/2024)  Transportation Needs: No Transportation Needs (05/03/2024)  Utilities: Not At Risk (05/03/2024)  Social Connections: Moderately Integrated (05/03/2024)  Tobacco Use: Medium Risk (05/07/2024)    Readmission Risk Interventions    05/06/2024   12:43 PM  Readmission Risk Prevention Plan  Transportation Screening Complete  PCP or Specialist Appt within 3-5 Days Complete  HRI or Home Care Consult Complete  Social  Work Consult for Recovery Care Planning/Counseling Complete  Palliative Care Screening Complete  Medication Review Oceanographer) Referral to Pharmacy

## 2024-05-17 DIAGNOSIS — D649 Anemia, unspecified: Secondary | ICD-10-CM | POA: Diagnosis not present

## 2024-05-17 NOTE — Progress Notes (Signed)
" ° °  Brief Progress Note   _____________________________________________________________________________________________________________  Patient Name: Kyle Kelley Patient DOB: [birth year] Date: 05/17/2024     Data: Patient admitted on 12/5 with LLE wound and anemia. DC plans are SNF. Patient level of care is Telemetry.    Action: Sent message to Dr Trixie requesting to transfer patient to [facility] floor.     Response:  Per Dr Trixie, okay to transfer to any tele monitored bed. Patient placement notified.   _____________________________________________________________________________________________________________  [facility] RN Expeditor Denni France Please contact us  directly via secure chat (search for [facility]) or by calling us  at [phone] [facility]).  "

## 2024-05-17 NOTE — Progress Notes (Signed)
" °  Progress Note    05/17/2024 10:31 AM 10 Days Post-Op  Subjective: Continues to improve  Vitals:   05/17/24 0500 05/17/24 0735  BP:  111/70  Pulse:  100  Resp: 19 17  Temp:  97.9 F (36.6 C)  SpO2: 94% 97%    Physical Exam: Awake alert and oriented Left medial leg wound VAC to suction Palpable left posterior tibial pulse  CBC    Component Value Date/Time   WBC 12.1 (H) 05/13/2024 0500   RBC 3.09 (L) 05/13/2024 0500   HGB 8.5 (L) 05/13/2024 0500   HCT 28.3 (L) 05/13/2024 0500   PLT 196 05/13/2024 0500   MCV 91.6 05/13/2024 0500   MCH 27.5 05/13/2024 0500   MCHC 30.0 05/13/2024 0500   RDW 17.9 (H) 05/13/2024 0500   LYMPHSABS 1.0 03/26/2024 0640   MONOABS 1.0 03/26/2024 0640   EOSABS 0.0 03/26/2024 0640   BASOSABS 0.0 03/26/2024 0640    BMET    Component Value Date/Time   NA 140 05/14/2024 1159   K 4.6 05/14/2024 1159   CL 105 05/14/2024 1159   CO2 30 05/14/2024 1159   GLUCOSE 77 05/14/2024 1159   BUN 23 05/14/2024 1159   CREATININE 0.36 (L) 05/14/2024 1159   CALCIUM  7.9 (L) 05/14/2024 1159   GFRNONAA >60 05/14/2024 1159   GFRAA >90 11/01/2012 0522    INR    Component Value Date/Time   INR 1.2 05/04/2024 0550     Intake/Output Summary (Last 24 hours) at 05/17/2024 1031 Last data filed at 05/17/2024 0952 Gross per 24 hour  Intake 263 ml  Output 500 ml  Net -237 ml     Assessment/plan:  78 y.o. male is s/p debridement of postsurgical left medial leg wound now with wound VAC in place.  Will change Monday prior to discharge to SNF.     Kseniya Grunden C. Sheree, MD [facility] [place] Office: [phone] Pager: [phone]  05/17/2024 10:31 AM  "

## 2024-05-17 NOTE — Progress Notes (Signed)
 Physical Therapy Treatment Patient Details Name: Kyle Kelley MRN: [id number] DOB: Dec 23, 1945 Today's Date: 05/17/2024   History of Present Illness Kyle Kelley is a 78 y.o. male admitted 05/03/24 with symptomatic anemia. He has been followed closely by vascular team outpatient and at visit 12/1 noted worsening wound dehiscence brought in 12/5 for debridement in OR and placement of wound VAC. Pt underwent EGD 12/6, which showed  gastritis, duodenal ulcers clean base nonbleeding, random gastric biopsies r/o h-pylori. PMHx: atrial fibrillation (on Eliquis /recently held), vasculopath, acute on chronic ischemic LLE nonhealing wound, HTN, HLD, aortic stenosis, CVA, prostate cancer s/p prostatectomy 2014, and PVD.    PT Comments  Pt resting in bed on arrival, eager for mobility and demonstrating continued progress towards acute goals. Pt able to come to sitting EOB with CGA for safety with use of bed features. Pt continues to require min assist to boost to stand due to LE weakness, however improving with activity with pt standing from recliner chair with CGA. Pt progressing gait with RW for support with cues for improved posture and forward gaze as well as safety as pt with tendency to increase gait speed unsafely with fatigue, chair follow provided for safety with pt needing x1 seated recovery. HR into 120s with activity. Current plan remains appropriate to address deficits and maximize functional independence and decrease caregiver burden. Pt continues to benefit from skilled PT services to progress toward functional mobility goals.      If plan is discharge home, recommend the following: A little help with walking and/or transfers;A little help with bathing/dressing/bathroom;Assistance with cooking/housework;Assist for transportation;Help with stairs or ramp for entrance   Can travel by private vehicle     Yes  Equipment Recommendations  None recommended by PT    Recommendations for Other  Services       Precautions / Restrictions Precautions Precautions: Fall Recall of Precautions/Restrictions: Intact Precaution/Restrictions Comments: wound vac LLE Restrictions Weight Bearing Restrictions Per Provider Order: No     Mobility  Bed Mobility Overal bed mobility: Needs Assistance Bed Mobility: Supine to Sit     Supine to sit: Contact guard, HOB elevated, Used rails     General bed mobility comments: increased time and use of bed rails    Transfers Overall transfer level: Needs assistance Equipment used: Rolling walker (2 wheels) Transfers: Sit to/from Stand, Bed to chair/wheelchair/BSC Sit to Stand: Min assist, Contact guard assist           General transfer comment: min A to stand from slightly elevated EOB, CGA from chair with cues to scoot fully out to edge    Ambulation/Gait Ambulation/Gait assistance: Contact guard assist, +2 safety/equipment (+2 for chair follow) Gait Distance (Feet): 93 Feet (x2) Assistive device: Rolling walker (2 wheels) Gait Pattern/deviations: Step-through pattern, Decreased stride length, Trunk flexed Gait velocity: decr     General Gait Details: low shuffling steps with flexed posture and knees slightly flexed in stance, cues for closer RW proximity with improvement in posture. chair for safety as pt fatigues quickly   Stairs             Wheelchair Mobility     Tilt Bed    Modified Rankin (Stroke Patients Only)       Balance Overall balance assessment: Needs assistance Sitting-balance support: Bilateral upper extremity supported, Feet supported Sitting balance-Leahy Scale: Fair Sitting balance - Comments: EOB   Standing balance support: Single extremity supported, Bilateral upper extremity supported, During functional activity Standing balance-Leahy Scale: Poor Standing  balance comment: reliant on RW support                            Communication Communication Communication:  Impaired Factors Affecting Communication: Hearing impaired  Cognition Arousal: Alert Behavior During Therapy: Flat affect, WFL for tasks assessed/performed                             Following commands: Intact      Cueing Cueing Techniques: Verbal cues, Gestural cues  Exercises      General Comments General comments (skin integrity, edema, etc.): HR increasing to 120s with activity      Pertinent Vitals/Pain Pain Assessment Pain Assessment: No/denies pain Faces Pain Scale: Hurts a little bit Pain Intervention(s): Monitored during session    Home Living                          Prior Function            PT Goals (current goals can now be found in the care plan section) Acute Rehab PT Goals Patient Stated Goal: Return Home Progress towards PT goals: Progressing toward goals    Frequency    Min 2X/week      PT Plan      Co-evaluation              AM-PAC PT 6 Clicks Mobility   Outcome Measure  Help needed turning from your back to your side while in a flat bed without using bedrails?: A Little Help needed moving from lying on your back to sitting on the side of a flat bed without using bedrails?: A Little Help needed moving to and from a bed to a chair (including a wheelchair)?: A Little Help needed standing up from a chair using your arms (e.g., wheelchair or bedside chair)?: A Little Help needed to walk in hospital room?: A Lot Help needed climbing 3-5 steps with a railing? : A Lot 6 Click Score: 16    End of Session Equipment Utilized During Treatment: Gait belt Activity Tolerance: Patient tolerated treatment well Patient left: in chair;with call bell/phone within reach;with family/visitor present Nurse Communication: Mobility status PT Visit Diagnosis: Difficulty in walking, not elsewhere classified (R26.2);Other abnormalities of gait and mobility (R26.89)     Time: 9152-9092 PT Time Calculation (min) (ACUTE ONLY): 20  min  Charges:    $Gait Training: 8-22 mins PT General Charges $$ ACUTE PT VISIT: 1 Visit                     Franchon Ketterman R. PTA Acute Rehabilitation Services Office: [phone]   Therisa CHRISTELLA Boor 05/17/2024, 1:01 PM

## 2024-05-17 NOTE — Progress Notes (Signed)
 Mobility Specialist Progress Note:    05/17/24 1400  Mobility  Activity Pivoted/transferred from chair to bed  Level of Assistance Minimal assist, patient does 75% or more  Assistive Device Front wheel walker  Distance Ambulated (ft) 5 ft  Activity Response Tolerated well  Mobility Referral Yes  Mobility visit 1 Mobility  Mobility Specialist Start Time (ACUTE ONLY) 1320  Mobility Specialist Stop Time (ACUTE ONLY) 1334  Mobility Specialist Time Calculation (min) (ACUTE ONLY) 14 min   Pt received in chair agreeable to mobility. Required MinA for STS and contact guard during ambulation. Was able to take a couple steps towards the bed w/o fault. Assisted pt to supine in bed, w/ call bell and personal belongings in reach. All needs met.   Thersia Minder Mobility Specialist  Please contact vis Secure Chat or  Rehab Office [phone]

## 2024-05-17 NOTE — Progress Notes (Signed)
 " PROGRESS NOTE  Kyle Kelley FMW:[id number] DOB: Mar 02, 1946 DOA: 05/03/2024 PCP: Charlott Dorn LABOR, MD   LOS: 14 days   Brief Narrative / Interim history: 78 year old male with history of A-fib, PAD with acute on chronic left lower extremity nonhealing wound, HTN, HLD, aortic stenosis, prior CVA, prostate cancer status post prostatectomy who came into the hospital for left lower extremity wound.  He had left limb ischemia in October 2025, underwent left lower extremity thrombectomy, endarterectomy with vein patch angioplasty.  He was followed as an outpatient on December 1 and was noted to have dehiscence of the wound and was admitted to the hospital, taken to the OR on 12/5 for debridement and placement of a wound VAC.  On admission he was also found to be anemic, with signs of a GI bleed, underwent an EGD on 12/6 which showed a duodenal ulcer.  Hospital course complicated by worsening abdominal pain, he was also was found to have perforated and was taken to the OR on 12/9 status post ex lap and Midwife.  Postop recovered well, was briefly on TPN, now back on regular food, TPN is off, and surgery signed off 12/16.  He remains with a wound VAC on the left lower extremity wound  Subjective / 24h Interval events: Did not sleep well last night because he napped yesterday afternoon, but overall doing well.  He denies any chest pain.  He denies any abdominal pain, no nausea or vomiting.  Has been eating breakfast without difficulties  Assesement and Plan: Principal problem Perforated duodenal ulcer, acute GI bleed, acute blood loss anemia, duodenal ulcer -he was found to be anemic on admission, an EGD showed a duodenal ulcer.  Hospital course complicated by abdominal pain, leukocytosis, and further imaging showed the ulcers has perforated, and was taken to the OR on 12/9 status post ex lap and Midwife.  Postop recovered well, was briefly on TPN, now back on regular food, TPN is  off, and surgery signed off 12/16.   - Hemoglobin has remained stable, tolerating a regular diet  Active problems PAD, left lower limb ischemia October 2025 -initially status post left lower extremity thromboembolectomy on 10/27, postoperative course complicated by wound dehiscence status post new debridement on 12/5, now has a wound VAC in place.  Wound VAC changed this morning, discussed with Dr. Sheree, wound looks good and from vascular standpoint could discharge on portable wound VAC with outpatient follow-up in a week.  SNF pending, they will have a bed frame on Monday  PAF -continue Toprol , anticoagulation with Eliquis .  Rates are stable  Hypokalemia-continue to monitor and replace as indicated.  Potassium normalized on most recent check at 4.6, repeat tomorrow morning  Stage III sacral decubitus ulcer-POA, continue local wound care  Essential hypertension-continue metoprolol , blood pressure stable  History of CVA-currently on Eliquis   Acute hypoxic respiratory failure-now resolved, remains on room air  Scheduled Meds:  apixaban   5 mg Oral BID   Chlorhexidine  Gluconate Cloth  6 each Topical Daily   clopidogrel   75 mg Oral Daily   feeding supplement  237 mL Oral TID BM   liver oil-zinc  oxide   Topical BID   methocarbamol   500 mg Oral TID   metoprolol  succinate  25 mg Oral Daily   multivitamin with minerals  1 tablet Oral Daily   pantoprazole   40 mg Oral BID   polyethylene glycol  17 g Oral Daily   senna-docusate  1 tablet Oral BID  sodium chloride  flush  10-40 mL Intracatheter Q12H   sodium chloride  flush  3 mL Intravenous Q12H   sodium chloride  flush  3 mL Intravenous Q12H   Continuous Infusions: PRN Meds:.HYDROmorphone  (DILAUDID ) injection, ipratropium, [DISCONTINUED] ondansetron  **OR** ondansetron  (ZOFRAN ) IV, sodium chloride  flush, sodium phosphate  Current Outpatient Medications  Medication Instructions   apixaban  (ELIQUIS ) 5 mg, 2 times daily   diltiazem  (TIAZAC )  300 mg, Daily   docusate sodium  (COLACE) 100 mg, Daily PRN   EPINEPHrine  (EPIPEN  IJ) 1 application , As needed   metoprolol  succinate (TOPROL -XL) 25 mg, Oral, Daily   polyethylene glycol powder (GLYCOLAX /MIRALAX ) 17 g, Oral, Daily PRN, Dissolve 1 capful (17g) in 4-8 ounces of liquid and take by mouth daily.   rosuvastatin  (CRESTOR ) 10 mg, Oral, Daily   sulfamethoxazole -trimethoprim  (BACTRIM  DS) 800-160 MG tablet Take 1 tablet by mouth 2 (two) times daily for 14 days.   Diet Orders (From admission, onward)     Start     Ordered   05/13/24 0823  DIET SOFT Room service appropriate? Yes; Fluid consistency: Thin  Diet effective now       Question Answer Comment  Room service appropriate? Yes   Fluid consistency: Thin      05/13/24 9177            DVT prophylaxis: Place and maintain sequential compression device Start: 05/09/24 1216 SCDs Start: 05/03/24 1250 apixaban  (ELIQUIS ) tablet 5 mg   Lab Results  Component Value Date   PLT 196 05/13/2024      Code Status: Full Code  Family Communication: Wife present at bedside  Status is: Inpatient Remains inpatient appropriate because: Severity of illness   Level of care: Progressive  Consultants:  General Surgery Gastroenterology Vascular surgery  Objective: Vitals:   05/17/24 0051 05/17/24 0400 05/17/24 0500 05/17/24 0735  BP:  108/60  111/70  Pulse:    100  Resp: 18 14 19 17   Temp:  97.6 F (36.4 C)  97.9 F (36.6 C)  TempSrc:  Oral  Oral  SpO2: 96% 95% 94% 97%  Weight:   79.4 kg   Height:        Intake/Output Summary (Last 24 hours) at 05/17/2024 9071 Last data filed at 05/17/2024 9391 Gross per 24 hour  Intake 240 ml  Output 500 ml  Net -260 ml   Wt Readings from Last 3 Encounters:  05/17/24 79.4 kg  04/29/24 77.6 kg  04/23/24 76.2 kg    Examination: Constitutional: NAD Eyes: lids and conjunctivae normal, no scleral icterus ENMT: mmm Neck: normal, supple Respiratory: clear to auscultation  bilaterally, no wheezing, no crackles. Normal respiratory effort.  Cardiovascular: Regular rate and rhythm, no murmurs / rubs / gallops. No LE edema. Abdomen: soft, no distention, no tenderness. Bowel sounds positive.   Data Reviewed: I have independently reviewed following labs and imaging studies   CBC Recent Labs  Lab 05/11/24 0622 05/12/24 0552 05/13/24 0500  WBC 13.4* 12.3* 12.1*  HGB 9.1* 8.7* 8.5*  HCT 29.3* 28.1* 28.3*  PLT 150 174 196  MCV 89.1 91.5 91.6  MCH 27.7 28.3 27.5  MCHC 31.1 31.0 30.0  RDW 17.4* 17.6* 17.9*    Recent Labs  Lab 05/11/24 0622 05/12/24 0552 05/13/24 0500 05/14/24 1159  NA 139 137 139 140  K 3.6 3.9 4.4 4.6  CL 99 100 104 105  CO2 31 30 32 30  GLUCOSE 130* 125* 116* 77  BUN 18 18 19 23   CREATININE 0.51* 0.41* 0.41* 0.36*  CALCIUM  7.8* 7.5* 7.5* 7.9*  AST  --   --  56*  --   ALT  --   --  70*  --   ALKPHOS  --   --  67  --   BILITOT  --   --  0.6  --   ALBUMIN   --   --  <1.5*  --   MG 2.0 2.1 2.1  --     ------------------------------------------------------------------------------------------------------------------ No results for input(s): CHOL, HDL, LDLCALC, TRIG, CHOLHDL, LDLDIRECT in the last 72 hours.   Lab Results  Component Value Date   HGBA1C 5.7 (H) 02/29/2024   ------------------------------------------------------------------------------------------------------------------ No results for input(s): TSH, T4TOTAL, T3FREE, THYROIDAB in the last 72 hours.  Invalid input(s): FREET3  Cardiac Enzymes No results for input(s): CKMB, TROPONINI, MYOGLOBIN in the last 168 hours.  Invalid input(s): CK ------------------------------------------------------------------------------------------------------------------    Component Value Date/Time   BNP 351.1 (H) 05/08/2024 0500    CBG: Recent Labs  Lab 05/13/24 1802 05/13/24 2336 05/14/24 0636 05/14/24 1552 05/14/24 2309  GLUCAP 148* 113*  104* 146* 99    No results found for this or any previous visit (from the past 240 hours).    Radiology Studies: VAS US  UPPER EXTREMITY VENOUS DUPLEX Result Date: 05/16/2024 UPPER VENOUS STUDY  Patient Name:  MODESTO GANOE  Date of Exam:   05/16/2024 Medical Rec #: [id number]       Accession #:    [id number] Date of Birth: [birth year]       Patient Gender: M Patient Age:   43 years Exam Location:  [facility] Procedure:      VAS US  UPPER EXTREMITY VENOUS DUPLEX Referring Phys: JUSTIN HOWERTER --------------------------------------------------------------------------------  Indications: Edema. Weeping. Limitations: Bandages, line, poor ultrasound/tissue interface and Subcutaneous edema. Comparison Study: No prior study on file Performing Technologist: Alberta Lis RVS  Examination Guidelines: A complete evaluation includes B-mode imaging, spectral Doppler, color Doppler, and power Doppler as needed of all accessible portions of each vessel. Bilateral testing is considered an integral part of a complete examination. Limited examinations for reoccurring indications may be performed as noted.  Right Findings: +----------+------------+---------+-----------+----------+--------------+ RIGHT     CompressiblePhasicitySpontaneousProperties   Summary     +----------+------------+---------+-----------+----------+--------------+ IJV           Full       Yes       Yes                             +----------+------------+---------+-----------+----------+--------------+ Subclavian               Yes       Yes                             +----------+------------+---------+-----------+----------+--------------+ Axillary      Full       Yes       Yes                             +----------+------------+---------+-----------+----------+--------------+ Brachial      Full                                                  +----------+------------+---------+-----------+----------+--------------+ Radial  Full                                                 +----------+------------+---------+-----------+----------+--------------+ Ulnar         Full                                                 +----------+------------+---------+-----------+----------+--------------+ Cephalic                                            Not visualized +----------+------------+---------+-----------+----------+--------------+ Basilic       Full       Yes       Yes                             +----------+------------+---------+-----------+----------+--------------+  Left Findings: +----------+------------+---------+-----------+----------+-------+ LEFT      CompressiblePhasicitySpontaneousPropertiesSummary +----------+------------+---------+-----------+----------+-------+ Subclavian               Yes       Yes                      +----------+------------+---------+-----------+----------+-------+  Summary:  Right: No evidence of deep vein thrombosis in the upper extremity. No evidence of superficial vein thrombosis in the visualized veins of the upper extremity.  Left: No evidence of thrombosis in the subclavian.  *See table(s) above for measurements and observations.  Diagnosing physician: Fonda Rim Electronically signed by Fonda Rim on 05/16/2024 at 4:40:24 PM.    Final    VAS US  LOWER EXTREMITY VENOUS (DVT) Result Date: 05/16/2024  Lower Venous DVT Study Patient Name:  BOHDAN MACHO  Date of Exam:   05/16/2024 Medical Rec #: [id number]       Accession #:    [id number] Date of Birth: 1945/09/03       Patient Gender: M Patient Age:   72 years Exam Location:  [facility] Procedure:      VAS US  LOWER EXTREMITY VENOUS (DVT) Referring Phys: JUSTIN HOWERTER --------------------------------------------------------------------------------  Indications: Pitting edema.  Comparison Study: No prior  study on file Performing Technologist: Alberta Lis RVS  Examination Guidelines: A complete evaluation includes B-mode imaging, spectral Doppler, color Doppler, and power Doppler as needed of all accessible portions of each vessel. Bilateral testing is considered an integral part of a complete examination. Limited examinations for reoccurring indications may be performed as noted. The reflux portion of the exam is performed with the patient in reverse Trendelenburg.  +---------+---------------+---------+-----------+----------+---------------+ RIGHT    CompressibilityPhasicitySpontaneityPropertiesThrombus Aging  +---------+---------------+---------+-----------+----------+---------------+ CFV      Full           Yes      Yes                                  +---------+---------------+---------+-----------+----------+---------------+ SFJ      Full                                                         +---------+---------------+---------+-----------+----------+---------------+  FV Prox  Full           Yes      Yes                                  +---------+---------------+---------+-----------+----------+---------------+ FV Mid   Full           Yes      Yes                                  +---------+---------------+---------+-----------+----------+---------------+ FV DistalFull           Yes      Yes                                  +---------+---------------+---------+-----------+----------+---------------+ PFV      Full           Yes      Yes                                  +---------+---------------+---------+-----------+----------+---------------+ POP      Full           Yes      Yes                                  +---------+---------------+---------+-----------+----------+---------------+ PTV                                                   patent by color +---------+---------------+---------+-----------+----------+---------------+ PERO                                                   patent by color +---------+---------------+---------+-----------+----------+---------------+ Gastroc  Full                                                         +---------+---------------+---------+-----------+----------+---------------+   +----+---------------+---------+-----------+----------+--------------+ LEFTCompressibilityPhasicitySpontaneityPropertiesThrombus Aging +----+---------------+---------+-----------+----------+--------------+ CFV Full           Yes      No                                  +----+---------------+---------+-----------+----------+--------------+ SFJ Full                                                        +----+---------------+---------+-----------+----------+--------------+     Summary: RIGHT: - There is no evidence of deep vein thrombosis in the lower extremity.  - A cystic structure is found in the popliteal fossa.  LEFT: -  No evidence of common femoral vein obstruction.   *See table(s) above for measurements and observations. Electronically signed by Fonda Rim on 05/16/2024 at 4:39:46 PM.    Final      Nilda Fendt, MD, PhD [facility]  Between 7 am - 7 pm I am available, please contact me via Amion (for emergencies) or Securechat (non urgent messages)  Between 7 pm - 7 am I am not available, please contact night coverage MD/APP via Amion  "

## 2024-05-18 DIAGNOSIS — D649 Anemia, unspecified: Secondary | ICD-10-CM | POA: Diagnosis not present

## 2024-05-18 LAB — CBC
HCT: 29.5 % — ABNORMAL LOW (ref 39.0–52.0)
Hemoglobin: 9 g/dL — ABNORMAL LOW (ref 13.0–17.0)
MCH: 27.4 pg (ref 26.0–34.0)
MCHC: 30.5 g/dL (ref 30.0–36.0)
MCV: 89.9 fL (ref 80.0–100.0)
Platelets: 359 K/uL (ref 150–400)
RBC: 3.28 MIL/uL — ABNORMAL LOW (ref 4.22–5.81)
RDW: 17.2 % — ABNORMAL HIGH (ref 11.5–15.5)
WBC: 11.4 K/uL — ABNORMAL HIGH (ref 4.0–10.5)
nRBC: 0 % (ref 0.0–0.2)

## 2024-05-18 LAB — COMPREHENSIVE METABOLIC PANEL WITH GFR
ALT: 55 U/L — ABNORMAL HIGH (ref 0–44)
AST: 37 U/L (ref 15–41)
Albumin: 2.4 g/dL — ABNORMAL LOW (ref 3.5–5.0)
Alkaline Phosphatase: 88 U/L (ref 38–126)
Anion gap: 4 — ABNORMAL LOW (ref 5–15)
BUN: 16 mg/dL (ref 8–23)
CO2: 30 mmol/L (ref 22–32)
Calcium: 7.8 mg/dL — ABNORMAL LOW (ref 8.9–10.3)
Chloride: 101 mmol/L (ref 98–111)
Creatinine, Ser: 0.39 mg/dL — ABNORMAL LOW (ref 0.61–1.24)
GFR, Estimated: >60 mL/min
Glucose, Bld: 113 mg/dL — ABNORMAL HIGH (ref 70–99)
Potassium: 4.3 mmol/L (ref 3.5–5.1)
Sodium: 134 mmol/L — ABNORMAL LOW (ref 135–145)
Total Bilirubin: 0.2 mg/dL (ref 0.0–1.2)
Total Protein: 5.5 g/dL — ABNORMAL LOW (ref 6.5–8.1)

## 2024-05-18 LAB — MAGNESIUM: Magnesium: 1.8 mg/dL (ref 1.7–2.4)

## 2024-05-18 LAB — PHOSPHORUS: Phosphorus: 2.3 mg/dL — ABNORMAL LOW (ref 2.5–4.6)

## 2024-05-18 NOTE — Progress Notes (Signed)
 " PROGRESS NOTE  SHAHIL SPEEGLE FMW:[id number] DOB: [birth year] DOA: 05/03/2024 PCP: Charlott Dorn LABOR, MD   LOS: 15 days   Brief Narrative / Interim history: 78 year old male with history of A-fib, PAD with acute on chronic left lower extremity nonhealing wound, HTN, HLD, aortic stenosis, prior CVA, prostate cancer status post prostatectomy who came into the hospital for left lower extremity wound.  He had left limb ischemia in October 2025, underwent left lower extremity thrombectomy, endarterectomy with vein patch angioplasty.  He was followed as an outpatient on December 1 and was noted to have dehiscence of the wound and was admitted to the hospital, taken to the OR on 12/5 for debridement and placement of a wound VAC.  On admission he was also found to be anemic, with signs of a GI bleed, underwent an EGD on 12/6 which showed a duodenal ulcer.  Hospital course complicated by worsening abdominal pain, he was also was found to have perforated and was taken to the OR on 12/9 status post ex lap and Midwife.  Postop recovered well, was briefly on TPN, now back on regular food, TPN is off, and surgery signed off 12/16.  He remains with a wound VAC on the left lower extremity wound  Subjective / 24h Interval events: Doing well this morning, no chest pain, no shortness of breath.  No abdominal discomfort, no nausea or vomiting  Assesement and Plan: Principal problem Perforated duodenal ulcer, acute GI bleed, acute blood loss anemia, duodenal ulcer -he was found to be anemic on admission, an EGD showed a duodenal ulcer.  Hospital course complicated by abdominal pain, leukocytosis, and further imaging showed the ulcers has perforated, and was taken to the OR on 12/9 status post ex lap and Midwife.  Postop recovered well, was briefly on TPN, now back on regular food, TPN is off, and surgery signed off 12/16.   - Hemoglobin stable this morning  Active problems PAD, left lower  limb ischemia October 2025 -initially status post left lower extremity thromboembolectomy on 10/27, postoperative course complicated by wound dehiscence status post new debridement on 12/5, now has a wound VAC in place.  Wound VAC changed this morning, discussed with Dr. Sheree, wound looks good and from vascular standpoint could discharge on portable wound VAC with outpatient follow-up in a week.  SNF pending, they will have a bed for him on Monday  PAF -continue Toprol , anticoagulation with Eliquis .  Stable rates  Hypokalemia-continue to monitor and replace as indicated.  Potassium normalized today  Stage III sacral decubitus ulcer-POA, continue local wound care  Essential hypertension-continue metoprolol , blood pressure stable  History of CVA-currently on Eliquis   Acute hypoxic respiratory failure-now resolved, remains on room air  Scheduled Meds:  apixaban   5 mg Oral BID   Chlorhexidine  Gluconate Cloth  6 each Topical Daily   clopidogrel   75 mg Oral Daily   feeding supplement  237 mL Oral TID BM   liver oil-zinc  oxide   Topical BID   methocarbamol   500 mg Oral TID   metoprolol  succinate  25 mg Oral Daily   multivitamin with minerals  1 tablet Oral Daily   pantoprazole   40 mg Oral BID   polyethylene glycol  17 g Oral Daily   senna-docusate  1 tablet Oral BID   sodium chloride  flush  10-40 mL Intracatheter Q12H   sodium chloride  flush  3 mL Intravenous Q12H   sodium chloride  flush  3 mL Intravenous Q12H   Continuous  Infusions: PRN Meds:.HYDROmorphone  (DILAUDID ) injection, ipratropium, [DISCONTINUED] ondansetron  **OR** ondansetron  (ZOFRAN ) IV, sodium chloride  flush, sodium phosphate   Current Outpatient Medications  Medication Instructions   apixaban  (ELIQUIS ) 5 mg, 2 times daily   diltiazem  (TIAZAC ) 300 mg, Daily   docusate sodium  (COLACE) 100 mg, Daily PRN   EPINEPHrine  (EPIPEN  IJ) 1 application , As needed   metoprolol  succinate (TOPROL -XL) 25 mg, Oral, Daily   polyethylene  glycol powder (GLYCOLAX /MIRALAX ) 17 g, Oral, Daily PRN, Dissolve 1 capful (17g) in 4-8 ounces of liquid and take by mouth daily.   rosuvastatin  (CRESTOR ) 10 mg, Oral, Daily   sulfamethoxazole -trimethoprim  (BACTRIM  DS) 800-160 MG tablet Take 1 tablet by mouth 2 (two) times daily for 14 days.   Diet Orders (From admission, onward)     Start     Ordered   05/13/24 0823  DIET SOFT Room service appropriate? Yes; Fluid consistency: Thin  Diet effective now       Question Answer Comment  Room service appropriate? Yes   Fluid consistency: Thin      05/13/24 9177            DVT prophylaxis: Place and maintain sequential compression device Start: 05/09/24 1216 SCDs Start: 05/03/24 1250 apixaban  (ELIQUIS ) tablet 5 mg   Lab Results  Component Value Date   PLT 359 05/18/2024      Code Status: Full Code  Family Communication: Wife present at bedside  Status is: Inpatient Remains inpatient appropriate because: Severity of illness   Level of care: Telemetry  Consultants:  General Surgery Gastroenterology Vascular surgery  Objective: Vitals:   05/18/24 0256 05/18/24 0443 05/18/24 0749 05/18/24 0957  BP: 130/77  116/64 107/67  Pulse: 87  (!) 106 (!) 102  Resp: 12  19   Temp: 97.6 F (36.4 C)  97.6 F (36.4 C)   TempSrc: Oral  Oral   SpO2: 95%  94%   Weight:  83.6 kg    Height:        Intake/Output Summary (Last 24 hours) at 05/18/2024 1015 Last data filed at 05/18/2024 1000 Gross per 24 hour  Intake 10 ml  Output 540 ml  Net -530 ml   Wt Readings from Last 3 Encounters:  05/18/24 83.6 kg  04/29/24 77.6 kg  04/23/24 76.2 kg    Examination: Constitutional: NAD Eyes: lids and conjunctivae normal, no scleral icterus ENMT: mmm Neck: normal, supple Respiratory: clear to auscultation bilaterally, no wheezing, no crackles. Normal respiratory effort.  Cardiovascular: Regular rate and rhythm, no murmurs / rubs / gallops. No LE edema. Abdomen: soft, no distention, no  tenderness. Bowel sounds positive.   Data Reviewed: I have independently reviewed following labs and imaging studies   CBC Recent Labs  Lab 05/12/24 0552 05/13/24 0500 05/18/24 0500  WBC 12.3* 12.1* 11.4*  HGB 8.7* 8.5* 9.0*  HCT 28.1* 28.3* 29.5*  PLT 174 196 359  MCV 91.5 91.6 89.9  MCH 28.3 27.5 27.4  MCHC 31.0 30.0 30.5  RDW 17.6* 17.9* 17.2*    Recent Labs  Lab 05/12/24 0552 05/13/24 0500 05/14/24 1159 05/18/24 0500  NA 137 139 140 134*  K 3.9 4.4 4.6 4.3  CL 100 104 105 101  CO2 30 32 30 30  GLUCOSE 125* 116* 77 113*  BUN 18 19 23 16   CREATININE 0.41* 0.41* 0.36* 0.39*  CALCIUM  7.5* 7.5* 7.9* 7.8*  AST  --  56*  --  37  ALT  --  70*  --  55*  ALKPHOS  --  67  --  88  BILITOT  --  0.6  --  0.2  ALBUMIN   --  <1.5*  --  2.4*  MG 2.1 2.1  --  1.8    ------------------------------------------------------------------------------------------------------------------ No results for input(s): CHOL, HDL, LDLCALC, TRIG, CHOLHDL, LDLDIRECT in the last 72 hours.   Lab Results  Component Value Date   HGBA1C 5.7 (H) 02/29/2024   ------------------------------------------------------------------------------------------------------------------ No results for input(s): TSH, T4TOTAL, T3FREE, THYROIDAB in the last 72 hours.  Invalid input(s): FREET3  Cardiac Enzymes No results for input(s): CKMB, TROPONINI, MYOGLOBIN in the last 168 hours.  Invalid input(s): CK ------------------------------------------------------------------------------------------------------------------    Component Value Date/Time   BNP 351.1 (H) 05/08/2024 0500    CBG: Recent Labs  Lab 05/13/24 1802 05/13/24 2336 05/14/24 0636 05/14/24 1552 05/14/24 2309  GLUCAP 148* 113* 104* 146* 99    No results found for this or any previous visit (from the past 240 hours).    Radiology Studies: No results found.    Nilda Fendt, MD, PhD [facility]  Between 7 am - 7 pm I am available, please contact me via Amion (for emergencies) or Securechat (non urgent messages)  Between 7 pm - 7 am I am not available, please contact night coverage MD/APP via Amion  "

## 2024-05-18 NOTE — Progress Notes (Signed)
 Mobility Specialist: Progress Note   05/18/24 1254  Mobility  Activity Ambulated with assistance  Level of Assistance Contact guard assist, steadying assist  Assistive Device Front wheel walker  Distance Ambulated (ft) 100 ft  Activity Response Tolerated well  Mobility Referral Yes  Mobility visit 1 Mobility  Mobility Specialist Start Time (ACUTE ONLY) 0855  Mobility Specialist Stop Time (ACUTE ONLY) 0904  Mobility Specialist Time Calculation (min) (ACUTE ONLY) 9 min    Pt received in bed, agreeable to mobility session. SV for bed mobility. CGA for STS and ambulation. No complaints. Returned to room and requested to use BR. Declined ambulating to the BR and preferred to sit on BSC at bedside. Left on [facility] with all needs met, call bell in reach. NT aware.   Ileana Lute Mobility Specialist Please contact via SecureChat or Rehab office at [phone]

## 2024-05-18 NOTE — Consult Note (Signed)
 WOC team consulted to change L lower extremity NPWT dressing Monday 12/22, peel and place requested prior to DC.    Patient underwent debridement of L medial leg wound 12/9 with wound vac placement at that time by vascular.    WOC team will follow Monday 12/22 as requested.   Thank you,    Powell Bar MSN, RN-BC, [employer]

## 2024-05-19 DIAGNOSIS — D649 Anemia, unspecified: Secondary | ICD-10-CM | POA: Diagnosis not present

## 2024-05-19 MED ORDER — CLOPIDOGREL BISULFATE 75 MG PO TABS: 75.0000 mg | ORAL_TABLET | Freq: Every day | ORAL | Status: AC

## 2024-05-19 MED ORDER — PANTOPRAZOLE SODIUM 40 MG PO TBEC: 40.0000 mg | DELAYED_RELEASE_TABLET | Freq: Two times a day (BID) | ORAL | Status: AC

## 2024-05-19 MED ORDER — SODIUM PHOSPHATES 45 MMOLE/15ML IV SOLN
15.0000 mmol | Freq: Once | INTRAVENOUS | Status: AC
  Administered 2024-05-19: 15 mmol via INTRAVENOUS
  Filled 2024-05-19: qty 5

## 2024-05-19 MED ORDER — PANTOPRAZOLE SODIUM 40 MG IV SOLR
40.0000 mg | Freq: Two times a day (BID) | INTRAVENOUS | Status: DC
  Administered 2024-05-19 (×2): 40 mg via INTRAVENOUS
  Filled 2024-05-19 (×3): qty 10

## 2024-05-19 MED ORDER — METOPROLOL TARTRATE 12.5 MG HALF TABLET
12.5000 mg | ORAL_TABLET | Freq: Two times a day (BID) | ORAL | Status: DC
  Administered 2024-05-19 – 2024-05-20 (×3): 12.5 mg via ORAL
  Filled 2024-05-19 (×3): qty 1

## 2024-05-19 NOTE — Progress Notes (Signed)
 " PROGRESS NOTE  Kyle Kelley FMW:[id number] DOB: 1946/02/15 DOA: 05/03/2024 PCP: Charlott Dorn LABOR, MD   LOS: 16 days   Brief Narrative / Interim history: 78 year old male with history of A-fib, PAD with acute on chronic left lower extremity nonhealing wound, HTN, HLD, aortic stenosis, prior CVA, prostate cancer status post prostatectomy who came into the hospital for left lower extremity wound.  He had left limb ischemia in October 2025, underwent left lower extremity thrombectomy, endarterectomy with vein patch angioplasty.  He was followed as an outpatient on December 1 and was noted to have dehiscence of the wound and was admitted to the hospital, taken to the OR on 12/5 for debridement and placement of a wound VAC.  On admission he was also found to be anemic, with signs of a GI bleed, underwent an EGD on 12/6 which showed a duodenal ulcer.  Hospital course complicated by worsening abdominal pain, he was also was found to have perforated and was taken to the OR on 12/9 status post ex lap and Midwife.  Postop recovered well, was briefly on TPN, now back on regular food, TPN is off, and surgery signed off 12/16.  He remains with a wound VAC on the left lower extremity wound  Subjective / 24h Interval events: He feels well this morning, has no complaints, he denies any abdominal pain, no nausea or vomiting.  Has been able to eat without difficulties.  Looking forward to go to rehab tomorrow  Assesement and Plan: Principal problem Perforated duodenal ulcer, acute GI bleed, acute blood loss anemia, duodenal ulcer -he was found to be anemic on admission, an EGD showed a duodenal ulcer.  Hospital course complicated by abdominal pain, leukocytosis, and further imaging showed the ulcers has perforated, and was taken to the OR on 12/9 status post ex lap and Midwife.  Postop recovered well, was briefly on TPN, now back on regular food, TPN is off, and surgery signed off 12/16.    - Hemoglobin stable  Active problems PAD, left lower limb ischemia October 2025 -initially status post left lower extremity thromboembolectomy on 10/27, postoperative course complicated by wound dehiscence status post new debridement on 12/5, now has a wound VAC in place.  Wound VAC changed this morning, discussed with Dr. Sheree, wound looks good and from vascular standpoint could discharge on portable wound VAC with outpatient follow-up in a week.  SNF pending, they will have a bed for [facility] to [facility] -continue Toprol , anticoagulation with Eliquis .  Stable rates  Hypokalemia-continue to monitor and replace as indicated.  Potassium normalized when last checked  Stage III sacral decubitus ulcer-POA, continue local wound care  Essential hypertension-continue metoprolol , blood pressure stable  History of CVA-currently on Eliquis   Acute hypoxic respiratory failure-now resolved, remains on room air  Scheduled Meds:  apixaban   5 mg Oral BID   Chlorhexidine  Gluconate Cloth  6 each Topical Daily   clopidogrel   75 mg Oral Daily   feeding supplement  237 mL Oral TID BM   liver oil-zinc  oxide   Topical BID   methocarbamol   500 mg Oral TID   metoprolol  succinate  25 mg Oral Daily   multivitamin with minerals  1 tablet Oral Daily   pantoprazole   40 mg Oral BID   polyethylene glycol  17 g Oral Daily   senna-docusate  1 tablet Oral BID   sodium chloride  flush  10-40 mL Intracatheter Q12H   Continuous Infusions:  sodium PHOSPHATE  IVPB (in  mmol) 15 mmol (05/19/24 0833)   PRN Meds:.HYDROmorphone  (DILAUDID ) injection, ipratropium, [DISCONTINUED] ondansetron  **OR** ondansetron  (ZOFRAN ) IV, sodium chloride  flush, sodium phosphate   Current Outpatient Medications  Medication Instructions   apixaban  (ELIQUIS ) 5 mg, 2 times daily   diltiazem  (TIAZAC ) 300 mg, Daily   docusate sodium  (COLACE) 100 mg, Daily PRN   EPINEPHrine  (EPIPEN  IJ) 1 application , As needed   metoprolol  succinate (TOPROL -XL)  25 mg, Oral, Daily   polyethylene glycol powder (GLYCOLAX /MIRALAX ) 17 g, Oral, Daily PRN, Dissolve 1 capful (17g) in 4-8 ounces of liquid and take by mouth daily.   rosuvastatin  (CRESTOR ) 10 mg, Oral, Daily   sulfamethoxazole -trimethoprim  (BACTRIM  DS) 800-160 MG tablet Take 1 tablet by mouth 2 (two) times daily for 14 days.   Diet Orders (From admission, onward)     Start     Ordered   05/13/24 0823  DIET SOFT Room service appropriate? Yes; Fluid consistency: Thin  Diet effective now       Question Answer Comment  Room service appropriate? Yes   Fluid consistency: Thin      05/13/24 9177            DVT prophylaxis: Place and maintain sequential compression device Start: 05/09/24 1216 SCDs Start: 05/03/24 1250 apixaban  (ELIQUIS ) tablet 5 mg   Lab Results  Component Value Date   PLT 359 05/18/2024      Code Status: Full Code  Family Communication: No family present at bedside  Status is: Inpatient Remains inpatient appropriate because: Severity of illness   Level of care: Telemetry  Consultants:  General Surgery Gastroenterology Vascular surgery  Objective: Vitals:   05/18/24 1948 05/18/24 2312 05/19/24 0329 05/19/24 0724  BP: (!) 110/58 (!) 119/53 124/72 105/72  Pulse: 93 96 89 96  Resp: 18 20 20 20   Temp: 97.9 F (36.6 C) (!) 97.3 F (36.3 C) 97.7 F (36.5 C) 97.7 F (36.5 C)  TempSrc: Oral Oral Oral Oral  SpO2: 94% 94% 95% 96%  Weight:   83.6 kg   Height:        Intake/Output Summary (Last 24 hours) at 05/19/2024 0955 Last data filed at 05/19/2024 0330 Gross per 24 hour  Intake 250 ml  Output 700 ml  Net -450 ml   Wt Readings from Last 3 Encounters:  05/19/24 83.6 kg  04/29/24 77.6 kg  04/23/24 76.2 kg    Examination: Constitutional: NAD Eyes: lids and conjunctivae normal, no scleral icterus ENMT: mmm Neck: normal, supple Respiratory: clear to auscultation bilaterally, no wheezing, no crackles. Normal respiratory effort.   Cardiovascular: Regular rate and rhythm, no murmurs / rubs / gallops. No LE edema. Abdomen: soft, no distention, no tenderness. Bowel sounds positive.   Data Reviewed: I have independently reviewed following labs and imaging studies   CBC Recent Labs  Lab 05/13/24 0500 05/18/24 0500  WBC 12.1* 11.4*  HGB 8.5* 9.0*  HCT 28.3* 29.5*  PLT 196 359  MCV 91.6 89.9  MCH 27.5 27.4  MCHC 30.0 30.5  RDW 17.9* 17.2*    Recent Labs  Lab 05/13/24 0500 05/14/24 1159 05/18/24 0500  NA 139 140 134*  K 4.4 4.6 4.3  CL 104 105 101  CO2 32 30 30  GLUCOSE 116* 77 113*  BUN 19 23 16   CREATININE 0.41* 0.36* 0.39*  CALCIUM  7.5* 7.9* 7.8*  AST 56*  --  37  ALT 70*  --  55*  ALKPHOS 67  --  88  BILITOT 0.6  --  0.2  ALBUMIN  <  1.5*  --  2.4*  MG 2.1  --  1.8    ------------------------------------------------------------------------------------------------------------------ No results for input(s): CHOL, HDL, LDLCALC, TRIG, CHOLHDL, LDLDIRECT in the last 72 hours.   Lab Results  Component Value Date   HGBA1C 5.7 (H) 02/29/2024   ------------------------------------------------------------------------------------------------------------------ No results for input(s): TSH, T4TOTAL, T3FREE, THYROIDAB in the last 72 hours.  Invalid input(s): FREET3  Cardiac Enzymes No results for input(s): CKMB, TROPONINI, MYOGLOBIN in the last 168 hours.  Invalid input(s): CK ------------------------------------------------------------------------------------------------------------------    Component Value Date/Time   BNP 351.1 (H) 05/08/2024 0500    CBG: Recent Labs  Lab 05/13/24 1802 05/13/24 2336 05/14/24 0636 05/14/24 1552 05/14/24 2309  GLUCAP 148* 113* 104* 146* 99    No results found for this or any previous visit (from the past 240 hours).    Radiology Studies: No results found.    Nilda Fendt, MD, PhD [facility]  Between 7 am  - 7 pm I am available, please contact me via Amion (for emergencies) or Securechat (non urgent messages)  Between 7 pm - 7 am I am not available, please contact night coverage MD/APP via Amion  "

## 2024-05-19 NOTE — Plan of Care (Signed)

## 2024-05-20 DIAGNOSIS — D649 Anemia, unspecified: Secondary | ICD-10-CM | POA: Diagnosis not present

## 2024-05-20 NOTE — Plan of Care (Signed)

## 2024-05-20 NOTE — Progress Notes (Signed)
 Vascular PA notified that the wound Vac dressings the pt is going to discharge with are not the peel and stick dressing that they have listed in the dressing change orders. PA states it is fine and those dressings will work for the wound vac as well.

## 2024-05-20 NOTE — Consult Note (Signed)
 WOC Nurse wound follow up; Discharge Day Wound type: LLE surgical wound, s/p debridement left medial aspect of leg Measurement: approx 14 L x 6.5 W x .75 D cm Wound bed: pink shallow, no epibole, very moist- could be related to moisture trapping if NPWT dressing was left in place for a prolonged period of time. Drainage serous, no malodor Periwound: intact, no macerated tissue Dressing procedure/placement/frequency: Wound VAC LLE frequency per provider, patient is being transferred to SNF, CM to verify that SNF is okay with VAC brand of dressing. In comparing wound images it appears that this wound is becoming more shallow and showing wound contraction. Coordinated care with therapy, primary nurse and covering nurse. Set up and connected portable home  unit and replace VAC Peel and Stick dressing from [employer], patient stated the dressing is comfortable. Answered all of the CGs questions.   WOC will not follow and will remove patient from census task list.  Please reconsult if wound worsens in condition and notify provider.   Sherrilyn Hals MSN RN CWOCN WOC [facility]  [phone] (Available from 7-3 pm Mon-Friday)

## 2024-05-20 NOTE — Discharge Summary (Signed)
 "  Physician Discharge Summary  Kyle Kelley FMW:[id number] DOB: [birth year] DOA: 05/03/2024  PCP: Charlott Dorn LABOR, MD  Admit date: 05/03/2024 Discharge date: 05/20/2024  Admitted From: home Disposition:  SNF  Recommendations for Outpatient Follow-up:  Follow up with PCP in 1-2 weeks Please obtain BMP/CBC in one week  Home Health: none Equipment/Devices: none  Discharge Condition: stable CODE STATUS: Full code Diet Orders (From admission, onward)     Start     Ordered   05/13/24 0823  DIET SOFT Room service appropriate? Yes; Fluid consistency: Thin  Diet effective now       Question Answer Comment  Room service appropriate? Yes   Fluid consistency: Thin      05/13/24 9177            Brief Narrative / Interim history: 78 year old male with history of A-fib, PAD with acute on chronic left lower extremity nonhealing wound, HTN, HLD, aortic stenosis, prior CVA, prostate cancer status post prostatectomy who came into the hospital for left lower extremity wound.  He had left limb ischemia in October 2025, underwent left lower extremity thrombectomy, endarterectomy with vein patch angioplasty.  He was followed as an outpatient on December 1 and was noted to have dehiscence of the wound and was admitted to the hospital, taken to the OR on 12/5 for debridement and placement of a wound VAC.  On admission he was also found to be anemic, with signs of a GI bleed, underwent an EGD on 12/6 which showed a duodenal ulcer.  Hospital course complicated by worsening abdominal pain, he was also was found to have perforated and was taken to the OR on 12/9 status post ex lap and Midwife.  Postop recovered well, was briefly on TPN, now back on regular food, TPN is off, and surgery signed off 12/16.  He remains with a wound VAC on the left lower extremity wound  Hospital Course / Discharge diagnoses: Principal problem Perforated duodenal ulcer, acute GI bleed, acute blood loss  anemia, duodenal ulcer -he was found to be anemic on admission, an EGD showed a duodenal ulcer.  Hospital course complicated by abdominal pain, leukocytosis, and further imaging showed the ulcers has perforated, and was taken to the OR on 12/9 status post ex lap and Midwife.  Postop recovered well, was briefly on TPN, now back on regular food, TPN is off, and surgery signed off 12/16.  Hemoglobin has remained stable.  Continue PPI   Active problems PAD, left lower limb ischemia October 2025 -initially status post left lower extremity thromboembolectomy on 10/27, postoperative course complicated by wound dehiscence status post new debridement on 12/5, now has a wound VAC in place.  Wound VAC changed prior to discharge, will have outpatient follow-up with vascular surgery  PAF -continue Toprol , anticoagulation with Eliquis .  Stable rates Hypokalemia-continue to monitor and replace as indicated.  Potassium normalized Stage III sacral decubitus ulcer-POA, continue local wound care Essential hypertension-continue metoprolol , blood pressure stable History of CVA-currently on Eliquis  Acute hypoxic respiratory failure-now resolved, remains on room air  Sepsis ruled out   Discharge Instructions   Allergies as of 05/20/2024       Reactions   Ace Inhibitors Anaphylaxis   Bactrim  Ds [sulfamethoxazole -trimethoprim ] Shortness Of Breath   Rythmol [propafenone] Shortness Of Breath   Nitrofurantoin Rash        Medication List     STOP taking these medications    diltiazem  300 MG 24 hr capsule Commonly known  as: TIAZAC    sulfamethoxazole -trimethoprim  800-160 MG tablet Commonly known as: Bactrim  DS       TAKE these medications    apixaban  5 MG Tabs tablet Commonly known as: ELIQUIS  Take 5 mg by mouth 2 (two) times daily.   clopidogrel  75 MG tablet Commonly known as: PLAVIX  Take 1 tablet (75 mg total) by mouth daily.   docusate sodium  100 MG capsule Commonly known as:  COLACE Take 100 mg by mouth daily as needed for mild constipation.   EPIPEN  IJ Inject 1 application  as directed as needed (anaphylaxis).   metoprolol  succinate 25 MG 24 hr tablet Commonly known as: TOPROL -XL Take 1 tablet (25 mg total) by mouth daily.   pantoprazole  40 MG tablet Commonly known as: PROTONIX  Take 1 tablet (40 mg total) by mouth 2 (two) times daily.   polyethylene glycol powder 17 GM/SCOOP powder Commonly known as: GLYCOLAX /MIRALAX  Take 17 g by mouth daily as needed for mild constipation. Dissolve 1 capful (17g) in 4-8 ounces of liquid and take by mouth daily.   rosuvastatin  10 MG tablet Commonly known as: CRESTOR  Take 1 tablet (10 mg total) by mouth daily.        Contact information for follow-up providers     [facility]) Follow up.   Specialty: Home Health Services Why: [facility] to resume services- will plan for visit on Monday for [employer] drsg change Contact information: [address] [place]  [phone]        [facility] home wound VAC Follow up.   Why: Important Resources and Contacts General Questions: [phone] Order Supplies: [phone], option 2, then dial ext. [phone] 24/7 Technical Support: [phone], option 3. Help with V.A.C. Therapy unit such as changing full canister, charging the unit, therapy alarms, troubleshooting dressing applications or questions regarding safety. Reach out to your health care provider managing your V.A.C. Therapy for any questions directly related to your therapy Contact information: If unit alarms or if experiencing issues to contact the home health, wound care clinic, or physicians office that is managing the V.A.C. Therapy. If unsuccessful, call [facility] Technical Support at [phone], option 3        Gastroenterology, Margarete. Schedule an appointment as soon as possible for a visit in 2 month(s).   Why: Follow-up for large duodenal  ulcer and GI bleed Contact information: [address] [place] [address] [phone]         Rana Lum CROME, NP Follow up on 06/06/2024.   Specialty: Cardiology Why: Cardiology Hospital Follow-up on 06/07/2023 at 10:55 AM. Please contact the office if needing a different date or time. Contact information: [address] [place] [address] [phone]         Vernetta Berg, MD Follow up.   Specialty: General Surgery Why: our office is scheduling you for post-operative follow up, call to confirm appointment date/time. Contact information: [address] [place] [address] [phone]              Contact information for after-discharge care     Destination     [facility], [place] .   Service: Skilled Nursing Contact information: [address] [place]  [address] [phone]                     Consultations: Vascular surgery  Procedures/Studies:  VAS US  UPPER EXTREMITY VENOUS DUPLEX Result Date: 05/16/2024 UPPER VENOUS STUDY  Patient Name:  Kyle Kelley  Date of Exam:   05/16/2024 Medical Rec #: [id number]       Accession #:    [id number] Date of Birth: 1945-10-08       Patient Gender: M Patient Age:   78 years Exam Location:  [facility] Procedure:      VAS US  UPPER EXTREMITY VENOUS DUPLEX Referring Phys: JUSTIN HOWERTER --------------------------------------------------------------------------------  Indications: Edema. Weeping. Limitations: Bandages, line, poor ultrasound/tissue interface and Subcutaneous edema. Comparison Study: No prior study on file Performing Technologist: Alberta Lis RVS  Examination Guidelines: A complete evaluation includes B-mode imaging, spectral Doppler, color Doppler, and power Doppler as needed of all accessible portions of each vessel. Bilateral testing is considered an integral part of a complete examination. Limited examinations for  reoccurring indications may be performed as noted.  Right Findings: +----------+------------+---------+-----------+----------+--------------+ RIGHT     CompressiblePhasicitySpontaneousProperties   Summary     +----------+------------+---------+-----------+----------+--------------+ IJV           Full       Yes       Yes                             +----------+------------+---------+-----------+----------+--------------+ Subclavian               Yes       Yes                             +----------+------------+---------+-----------+----------+--------------+ Axillary      Full       Yes       Yes                             +----------+------------+---------+-----------+----------+--------------+ Brachial      Full                                                 +----------+------------+---------+-----------+----------+--------------+ Radial        Full                                                 +----------+------------+---------+-----------+----------+--------------+ Ulnar         Full                                                 +----------+------------+---------+-----------+----------+--------------+ Cephalic                                            Not visualized +----------+------------+---------+-----------+----------+--------------+ Basilic       Full       Yes       Yes                             +----------+------------+---------+-----------+----------+--------------+  Left Findings: +----------+------------+---------+-----------+----------+-------+ LEFT      CompressiblePhasicitySpontaneousPropertiesSummary +----------+------------+---------+-----------+----------+-------+  Subclavian               Yes       Yes                      +----------+------------+---------+-----------+----------+-------+  Summary:  Right: No evidence of deep vein thrombosis in the upper extremity. No evidence of superficial vein thrombosis in  the visualized veins of the upper extremity.  Left: No evidence of thrombosis in the subclavian.  *See table(s) above for measurements and observations.  Diagnosing physician: Fonda Rim Electronically signed by Fonda Rim on 05/16/2024 at 4:40:24 PM.    Final    VAS US  LOWER EXTREMITY VENOUS (DVT) Result Date: 05/16/2024  Lower Venous DVT Study Patient Name:  Kyle Kelley  Date of Exam:   05/16/2024 Medical Rec #: [id number]       Accession #:    [id number] Date of Birth: Jun 05, 1945       Patient Gender: M Patient Age:   40 years Exam Location:  [facility] Procedure:      VAS US  LOWER EXTREMITY VENOUS (DVT) Referring Phys: JUSTIN HOWERTER --------------------------------------------------------------------------------  Indications: Pitting edema.  Comparison Study: No prior study on file Performing Technologist: Alberta Lis RVS  Examination Guidelines: A complete evaluation includes B-mode imaging, spectral Doppler, color Doppler, and power Doppler as needed of all accessible portions of each vessel. Bilateral testing is considered an integral part of a complete examination. Limited examinations for reoccurring indications may be performed as noted. The reflux portion of the exam is performed with the patient in reverse Trendelenburg.  +---------+---------------+---------+-----------+----------+---------------+ RIGHT    CompressibilityPhasicitySpontaneityPropertiesThrombus Aging  +---------+---------------+---------+-----------+----------+---------------+ CFV      Full           Yes      Yes                                  +---------+---------------+---------+-----------+----------+---------------+ SFJ      Full                                                         +---------+---------------+---------+-----------+----------+---------------+ FV Prox  Full           Yes      Yes                                   +---------+---------------+---------+-----------+----------+---------------+ FV Mid   Full           Yes      Yes                                  +---------+---------------+---------+-----------+----------+---------------+ FV DistalFull           Yes      Yes                                  +---------+---------------+---------+-----------+----------+---------------+ PFV      Full           Yes      Yes                                  +---------+---------------+---------+-----------+----------+---------------+  POP      Full           Yes      Yes                                  +---------+---------------+---------+-----------+----------+---------------+ PTV                                                   patent by color +---------+---------------+---------+-----------+----------+---------------+ PERO                                                  patent by color +---------+---------------+---------+-----------+----------+---------------+ Gastroc  Full                                                         +---------+---------------+---------+-----------+----------+---------------+   +----+---------------+---------+-----------+----------+--------------+ LEFTCompressibilityPhasicitySpontaneityPropertiesThrombus Aging +----+---------------+---------+-----------+----------+--------------+ CFV Full           Yes      No                                  +----+---------------+---------+-----------+----------+--------------+ SFJ Full                                                        +----+---------------+---------+-----------+----------+--------------+     Summary: RIGHT: - There is no evidence of deep vein thrombosis in the lower extremity.  - A cystic structure is found in the popliteal fossa.  LEFT: - No evidence of common femoral vein obstruction.   *See table(s) above for measurements and observations. Electronically signed by Fonda Rim on 05/16/2024 at 4:39:46 PM.    Final    DG UGI W SINGLE CM (SOL OR THIN BA) Result Date: 05/12/2024 CLINICAL DATA:  Patient with history of perforated duodenal ulcer s/p exploratory laparotomy and Graham patch repair 05/07/24. Request for UGI to evaluate for post operative leak prior to removal of NG tube and advancing diet. EXAM: DG UGI W SINGLE CM TECHNIQUE: Scout radiograph was obtained. Single contrast examination was performed using water  soluble contrast. This exam was performed by Clotilda Hesselbach, PA-C, and was supervised and interpreted by Ester Sides, MD. FLUOROSCOPY: Radiation Exposure Index (as provided by the fluoroscopic device): 5.00 mGy Kerma COMPARISON:  CT chest/abd/pelvis w/o contrast 05/06/24, XR abdomen 1 view 05/07/24. FINDINGS: Scout Radiograph: Multiple leads overlying lower chest. Non specific bowel gas pattern, dilated loops of colon in the left upper and lower quadrants. Right upper quadrant surgical drain. Nasogastric tube with tip in the gastric body. Esophagus: Not evaluated on problem focused exam. Esophageal motility:  Not evaluated on problem focused exam. Stomach: Normal appearance. No hiatal hernia. Gastric emptying: Normal. Duodenum: Normal appearance, no contrast extravasation . Other: Exam performed in prone AP, left oblique, right oblique  only due to patient's ability to stand or lay laterally due to pain. Contrast was injected via existing nasogastric tube. IMPRESSION: Water  soluble problem focused UGI without evidence of contrast extravasation. Electronically Signed   By: Ester Sides M.D.   On: 05/12/2024 11:03   VAS US  LOWER EXTREMITY VENOUS (DVT) Result Date: 05/08/2024  Lower Venous DVT Study Patient Name:  PEYTEN WEARE  Date of Exam:   05/08/2024 Medical Rec #: [id number]       Accession #:    [id number] Date of Birth: March 11, 1946       Patient Gender: M Patient Age:   42 years Exam Location:  [facility] Procedure:      VAS US  LOWER EXTREMITY  VENOUS (DVT) Referring Phys: A POWELL JR --------------------------------------------------------------------------------  Indications: Edema.  Risk Factors: None identified. Limitations: Poor ultrasound/tissue interface. Comparison Study: No prior studies. Performing Technologist: Cordella Collet RVT  Examination Guidelines: A complete evaluation includes B-mode imaging, spectral Doppler, color Doppler, and power Doppler as needed of all accessible portions of each vessel. Bilateral testing is considered an integral part of a complete examination. Limited examinations for reoccurring indications may be performed as noted. The reflux portion of the exam is performed with the patient in reverse Trendelenburg.  +---------+---------------+---------+-----------+----------+--------------+ RIGHT    CompressibilityPhasicitySpontaneityPropertiesThrombus Aging +---------+---------------+---------+-----------+----------+--------------+ CFV      Full           Yes      Yes                                 +---------+---------------+---------+-----------+----------+--------------+ SFJ      Full                                                        +---------+---------------+---------+-----------+----------+--------------+ FV Prox  Full                                                        +---------+---------------+---------+-----------+----------+--------------+ FV Mid   Full                                                        +---------+---------------+---------+-----------+----------+--------------+ FV DistalFull                                                        +---------+---------------+---------+-----------+----------+--------------+ PFV      Full                                                        +---------+---------------+---------+-----------+----------+--------------+ POP      Full  Yes      Yes                                  +---------+---------------+---------+-----------+----------+--------------+ PTV      Full                                                        +---------+---------------+---------+-----------+----------+--------------+ PERO     Full                                                        +---------+---------------+---------+-----------+----------+--------------+   +----+---------------+---------+-----------+----------+--------------+ LEFTCompressibilityPhasicitySpontaneityPropertiesThrombus Aging +----+---------------+---------+-----------+----------+--------------+ CFV Full           Yes      Yes                                 +----+---------------+---------+-----------+----------+--------------+    Summary: RIGHT: - There is no evidence of deep vein thrombosis in the lower extremity.  - A cystic structure is found in the popliteal fossa.  LEFT: - No evidence of common femoral vein obstruction.   *See table(s) above for measurements and observations. Electronically signed by Debby Robertson on 05/08/2024 at 10:13:36 PM.    Final    DG CHEST PORT 1 VIEW Result Date: 05/07/2024 EXAM: 1 VIEW(S) XRAY OF THE CHEST 05/07/2024 09:43:00 PM COMPARISON: 05/07/2024 CLINICAL HISTORY: S/P PICC central line placement FINDINGS: LINES, TUBES AND DEVICES: Right chest wall PICC in place with tip overlying the expected region of the superior caval junction. Right IJ central venous catheter in place with tip overlying the expected region of the superior caval junction. Enteric tube in place coursing below the hemidiaphragm with tip and side port. LUNGS AND PLEURA: Chronic coarsened interstitial markings without overt pulmonary edema. Small bilateral pleural effusions. No pneumothorax. HEART AND MEDIASTINUM: Atherosclerotic plaque noted. No acute abnormality of the cardiac and mediastinal silhouettes. BONES AND SOFT TISSUES: Persistent gas under right hemidiaphragm likely related to recent peritoneal  dialysis catheter placement. No acute osseous abnormality. IMPRESSION: 1. Right chest wall PICC, right IJ central venous catheter, and enteric tube in appropriate positions. 2. Small bilateral pleural effusions. 3. Persistent gas under the right hemidiaphragm, likely related to recent peritoneal dialysis catheter placement. Electronically signed by: Morgane Naveau MD 05/07/2024 09:49 PM EST RP Workstation: [id number]   DG CHEST PORT 1 VIEW Result Date: 05/07/2024 CLINICAL DATA:  Central line placement EXAM: PORTABLE CHEST 1 VIEW COMPARISON:  Prior chest x-ray obtained earlier today at 1:30 a.m. FINDINGS: New right IJ approach central venous catheter. The catheter tip overlies the mid SVC. Partially imaged gastric tube in place. The tip lies off the field of view, presumably within the stomach. No pneumothorax. Small bilateral layering pleural effusions and associated bibasilar atelectasis. Pulmonary vascular congestion with mild pulmonary edema. IMPRESSION: 1. New right IJ central venous catheter with the tip overlying the mid SVC. 2. No evidence of pneumothorax or other complication. 3. Pulmonary edema and bilateral layering pleural effusions suggest CHF. Electronically Signed   By: Wilkie Karalee HERO.D.  On: 05/07/2024 16:17   US  EKG SITE RITE Result Date: 05/07/2024 If Site Rite image not attached, placement could not be confirmed due to current cardiac rhythm.  DG Abd 1 View Result Date: 05/07/2024 CLINICAL DATA:  NG tube placement. EXAM: ABDOMEN - 1 VIEW COMPARISON:  Earlier same day FINDINGS: NG tube tip is in the mid stomach with proximal side port below the GE junction. Bibasilar atelectasis/infiltrate with small effusions again noted. Gaseous distention of bowel in the upper abdomen is similar to prior. IMPRESSION: NG tube tip is in the mid stomach. Electronically Signed   By: Camellia Candle M.D.   On: 05/07/2024 05:48   DG Chest Port 1 View Result Date: 05/07/2024 EXAM: 1 VIEW(S) XRAY OF THE  CHEST 05/07/2024 02:28:25 AM COMPARISON: CXR 10/24/2012, CT Chest 05/06/2024, XR Abdomen 05/07/2024. CLINICAL HISTORY: [id number] Encounter for nasogastric (NG) tube placement [id number] Encounter for nasogastric (NG) tube placement FINDINGS: LINES, TUBES AND DEVICES: Enteric tube overlies the superior and mid mediastinum with the tip not visualized, possibly at the level of the aortic arch. LUNGS AND PLEURA: Persistent bilateral small pleural effusions. Associated basilar airspace opacities. No pneumothorax. HEART AND MEDIASTINUM: Atherosclerotic plaque. BONES AND SOFT TISSUES: No acute osseous abnormality. IMPRESSION: 1. Enteric tube overlies the superior and mid mediastinum with the tip not visualized, potentially malpositioned at the level of the aortic arch. 2. Persistent bilateral small pleural effusions with associated basilar airspace opacities. Electronically signed by: Morgane Naveau MD 05/07/2024 02:39 AM EST RP Workstation: [id number]   DG Abd 1 View Result Date: 05/07/2024 EXAM: 1 VIEW XRAY OF THE ABDOMEN 05/07/2024 01:45:00 AM COMPARISON: None available. CLINICAL HISTORY: [id number] Encounter for nasogastric (NG) tube placement [id number] Encounter for nasogastric (NG) tube placement FINDINGS: LINES, TUBES AND DEVICES: Enteric tube is not visualized. Consider further evaluation with chest x-ray, as it may be coiled more proximally. BOWEL: Nonobstructive bowel gas pattern. Lower abdomen/pelvis collimated off view. SOFT TISSUES: No abnormal calcifications. Nonspecific blunting of bilateral costophrenic angles. BONES: No acute fracture. IMPRESSION: 1. Enteric tube not visualized. Further evaluation with chest radiograph is recommended, as it may be coiled more proximally. Electronically signed by: Morgane Naveau MD 05/07/2024 01:50 AM EST RP Workstation: [id number]   DG Abd 1 View Result Date: 05/07/2024 EXAM: 1 VIEW XRAY OF THE ABDOMEN 05/07/2024 12:52:00 AM COMPARISON: CT 05/06/2024. CLINICAL HISTORY: Encounter  for imaging study to confirm nasogastric (NG) tube placement. FINDINGS: LINES, TUBES AND DEVICES: NG tube tip in the distal esophagus. BOWEL: Nonobstructive bowel gas pattern. Free air noted in the upper abdomen. SOFT TISSUES: No abnormal calcifications. BONES: No acute fracture. LUNGS: Bibasilar airspace opacities and small effusions. IMPRESSION: 1. NG tube tip in the distal esophagus. Recommend advancing several cm 2. Free air in the upper abdomen. 3. Bibasilar airspace opacities and small effusions. Electronically signed by: Franky Crease MD 05/07/2024 12:59 AM EST RP Workstation: [id number]   CT CHEST ABDOMEN PELVIS WO CONTRAST Result Date: 05/06/2024 CLINICAL DATA:  , Hypoxia, and abdominal pain EXAM: CT CHEST, ABDOMEN AND PELVIS WITHOUT CONTRAST TECHNIQUE: Multidetector CT imaging of the chest, abdomen and pelvis was performed following the standard protocol without IV contrast. RADIATION DOSE REDUCTION: This exam was performed according to the departmental dose-optimization program which includes automated exposure control, adjustment of the mA and/or kV according to patient size and/or use of iterative reconstruction technique. COMPARISON:  CT abdomen and pelvis dated 12/24/2020 FINDINGS: CT CHEST FINDINGS Cardiovascular: Normal heart size. No significant pericardial fluid/thickening. Great vessels are normal in course and caliber.  Coronary artery calcifications and aortic atherosclerosis. Mediastinum/Nodes: Imaged thyroid  gland without nodules meeting criteria for imaging follow-up by size. Small hiatal hernia. No pathologically enlarged axillary, supraclavicular, mediastinal, or hilar lymph nodes. Lungs/Pleura: The central airways are patent. Trace secretions within the trachea. Mild diffuse bronchial wall thickening. Moderate centrilobular and paraseptal emphysema. Small foci of irregular consolidation within the posterior right upper lobe and basilar right middle lobe. Bilateral lower lobe subsegmental  atelectasis. No pneumothorax. Moderate bilateral pleural effusions. Musculoskeletal: No acute or abnormal lytic or blastic osseous lesions. Flowing anterior osteophytes over at least 4 contiguous thoracic vertebrae which can be seen in the setting of diffuse idiopathic skeletal hyperostosis (DISH). CT ABDOMEN PELVIS FINDINGS Hepatobiliary: Mildly nodular hepatic contour. No focal hepatic lesions. No intra or extrahepatic biliary ductal dilation. Normal gallbladder. Pancreas: No focal lesions or main ductal dilation. Spleen: Normal in size without focal abnormality. Adrenals/Urinary Tract: No adrenal nodules. No suspicious renal mass, calculi, or hydronephrosis. No focal bladder wall thickening. Stomach/Bowel: Normal appearance of the stomach. The wall of the 2/3 portion of the duodenum appears thickened/indistinct (3:81). Presumed cecum is displaced superiorly and anteriorly. A dilated loop of small bowel in the right upper quadrant (3:86) is inseparable from the thickened duodenum. Colonic diverticulosis without acute diverticulitis. Appendix is not discretely seen. Vascular/Lymphatic: Aortic atherosclerosis. No enlarged abdominal or pelvic lymph nodes. Reproductive: Prostatectomy.  Large bilateral hydroceles. Other: Small volume free air, predominantly within the right upper quadrant. A focus of free air appears to track to the proximal duodenum (3:70). Small volume free fluid with layering hyperattenuation in the lower abdomen/pelvis. Musculoskeletal: No acute or abnormal lytic or blastic osseous findings. Small bilateral inguinal hernias containing fat on the left and fat and free fluid on the right. Diffuse body wall edema. IMPRESSION: 1. Small volume pneumoperitoneum, predominantly within the right upper quadrant, with a focus of free air appearing to track to the proximal duodenum, suspicious for perforated duodenal ulcer. 2. Small volume free fluid with layering hyperattenuation in the lower abdomen/pelvis,  which may represent hemoperitoneum. 3. Small foci of irregular consolidation within the posterior right upper lobe and basilar right middle lobe, which may represent infection or aspiration. 4. Moderate bilateral pleural effusions, diffuse body wall edema, and large bilateral hydroceles. 5. Mildly nodular hepatic contour, which can be seen in the setting of cirrhosis. Recommend correlation with liver function tests. 6. Aortic Atherosclerosis (ICD10-I70.0) and Emphysema (ICD10-J43.9). Coronary artery calcifications. Assessment for potential risk factor modification, dietary therapy or pharmacologic therapy may be warranted, if clinically indicated. Critical Value/emergent results were called by telephone at the time of interpretation on 05/06/2024 at 5:45 pm to provider [facility] MDALA-[email or web address] , who verbally acknowledged these results. Electronically Signed   By: Limin  Xu M.D.   On: 05/06/2024 17:56     Subjective: - no chest pain, shortness of breath, no abdominal pain, nausea or vomiting.   Discharge Exam: BP 126/78 (BP Location: Left Arm)   Pulse 98   Temp 97.6 F (36.4 C) (Oral)   Resp 20   Ht 6' 1 (1.854 m)   Wt 80 kg   SpO2 98%   BMI 23.27 kg/m   General: Pt is alert, awake, not in acute distress Cardiovascular: RRR, S1/S2 +, no rubs, no gallops Respiratory: CTA bilaterally, no wheezing, no rhonchi Abdominal: Soft, NT, ND, bowel sounds + Extremities: no edema, no cyanosis    The results of significant diagnostics from this hospitalization (including imaging, microbiology, ancillary and laboratory) are listed below for reference.  Microbiology: No results found for this or any previous visit (from the past 240 hours).   Labs: Basic Metabolic Panel: Recent Labs  Lab 05/14/24 1159 05/18/24 0500  NA 140 134*  K 4.6 4.3  CL 105 101  CO2 30 30  GLUCOSE 77 113*  BUN 23 16  CREATININE 0.36* 0.39*  CALCIUM  7.9* 7.8*  MG  --  1.8  PHOS 2.5 2.3*   Liver Function  Tests: Recent Labs  Lab 05/18/24 0500  AST 37  ALT 55*  ALKPHOS 88  BILITOT 0.2  PROT 5.5*  ALBUMIN  2.4*   CBC: Recent Labs  Lab 05/18/24 0500  WBC 11.4*  HGB 9.0*  HCT 29.5*  MCV 89.9  PLT 359   CBG: Recent Labs  Lab 05/13/24 1802 05/13/24 2336 05/14/24 0636 05/14/24 1552 05/14/24 2309  GLUCAP 148* 113* 104* 146* 99   Hgb A1c No results for input(s): HGBA1C in the last 72 hours. Lipid Profile No results for input(s): CHOL, HDL, LDLCALC, TRIG, CHOLHDL, LDLDIRECT in the last 72 hours. Thyroid  function studies No results for input(s): TSH, T4TOTAL, T3FREE, THYROIDAB in the last 72 hours.  Invalid input(s): FREET3 Urinalysis    Component Value Date/Time   COLORURINE YELLOW 02/27/2024 2142   APPEARANCEUR CLEAR 02/27/2024 2142   LABSPEC 1.023 02/27/2024 2142   PHURINE 5.0 02/27/2024 2142   GLUCOSEU NEGATIVE 02/27/2024 2142   HGBUR NEGATIVE 02/27/2024 2142   BILIRUBINUR NEGATIVE 02/27/2024 2142   KETONESUR 5 (A) 02/27/2024 2142   PROTEINUR NEGATIVE 02/27/2024 2142   NITRITE NEGATIVE 02/27/2024 2142   LEUKOCYTESUR NEGATIVE 02/27/2024 2142    FURTHER DISCHARGE INSTRUCTIONS:   Get Medicines reviewed and adjusted: Please take all your medications with you for your next visit with your Primary MD   Laboratory/radiological data: Please request your Primary MD to go over all hospital tests and procedure/radiological results at the follow up, please ask your Primary MD to get all Hospital records sent to his/her office.   In some cases, they will be blood work, cultures and biopsy results pending at the time of your discharge. Please request that your primary care M.D. goes through all the records of your hospital data and follows up on these results.   Also Note the following: If you experience worsening of your admission symptoms, develop shortness of breath, life threatening emergency, suicidal or homicidal thoughts you must seek medical  attention immediately by calling 911 or calling your MD immediately  if symptoms less severe.   You must read complete instructions/literature along with all the possible adverse reactions/side effects for all the Medicines you take and that have been prescribed to you. Take any new Medicines after you have completely understood and accpet all the possible adverse reactions/side effects.    Do not drive when taking Pain medications or sleeping medications (Benzodaizepines)   Do not take more than prescribed Pain, Sleep and Anxiety Medications. It is not advisable to combine anxiety,sleep and pain medications without talking with your primary care practitioner   Special Instructions: If you have smoked or chewed Tobacco  in the last 2 yrs please stop smoking, stop any regular Alcohol  and or any Recreational drug use.   Wear Seat belts while driving.   Please note: You were cared for by a hospitalist during your hospital stay. Once you are discharged, your primary care physician will handle any further medical issues. Please note that NO REFILLS for any discharge medications will be authorized once you are discharged, as it  is imperative that you return to your primary care physician (or establish a relationship with a primary care physician if you do not have one) for your post hospital discharge needs so that they can reassess your need for medications and monitor your lab values.  Time coordinating discharge: 35 minutes  SIGNED:  Nilda Fendt, MD, PhD 05/20/2024, 8:13 AM   "

## 2024-05-20 NOTE — Plan of Care (Signed)

## 2024-05-20 NOTE — TOC Transition Note (Signed)
 Transition of Care [facility]) - Discharge Note   Patient Details  Name: Kyle Kelley MRN: [id number] Date of Birth: 01/26/1946  [facility] CM/SW Contact:  [name], LCSWA Phone Number: 05/20/2024, 10:50 AM   Clinical Narrative:     Patient will DC to: [facility] Anticipated DC date: 05/20/2024 Family notified: Nur Krasinski; Spouse; [phone] Transport by: [facility]   Per MD patient ready for DC to [facility]. RN to call report prior to discharge [phone]). RN, patient, patient's family, and facility notified of DC. Discharge Summary and FL2 sent to facility. DC packet on chart. Ambulance transport requested for patient at 10:50   CSW will sign off for now as social work intervention is no longer needed. Please consult us  again if new needs arise.    Final next level of care: Skilled Nursing Facility Barriers to Discharge: Barriers Resolved   Patient Goals and CMS Choice Patient states their goals for this hospitalization and ongoing recovery are:: SNF   Choice offered to / list presented to : Patient, Spouse      Discharge Placement              Patient chooses bed at: [facility], [place] Patient to be transferred to facility by: [facility] Name of family member notified: Tiago Humphrey; Spouse; [phone] Patient and family notified of of transfer: 05/20/24  Discharge Plan and Services Additional resources added to the After Visit Summary for   In-house Referral: Clinical Social Work Discharge Planning Services: CM Consult Post Acute Care Choice: Skilled Nursing Facility          DME Arranged: Vac DME Agency: [facility] Date DME Agency Contacted: 04/29/24   Representative spoke with at DME Agency: [facility] HH Arranged: RN HH Agency: [facility] Date HH Agency Contacted: 04/30/24 Time HH Agency Contacted: 1350 Representative spoke with at [facility] Agency: Research Scientist (physical Sciences)  Social Drivers of Health (SDOH) Interventions SDOH  Screenings   Food Insecurity: No Food Insecurity (05/03/2024)  Housing: Unknown (05/03/2024)  Transportation Needs: No Transportation Needs (05/03/2024)  Utilities: Not At Risk (05/03/2024)  Social Connections: Moderately Integrated (05/03/2024)  Tobacco Use: Medium Risk (05/07/2024)     Readmission Risk Interventions    05/06/2024   12:43 PM  Readmission Risk Prevention Plan  Transportation Screening Complete  PCP or Specialist Appt within 3-5 Days Complete  HRI or Home Care Consult Complete  Social Work Consult for Recovery Care Planning/Counseling Complete  Palliative Care Screening Complete  Medication Review Oceanographer) Referral to Pharmacy

## 2024-05-20 NOTE — Progress Notes (Signed)
 Report called to [facility] at [phone].

## 2024-05-29 ENCOUNTER — Ambulatory Visit: Attending: Vascular Surgery | Admitting: Physician Assistant

## 2024-05-29 VITALS — BP 100/65 | HR 116 | Temp 98.3°F | Resp 22 | Ht 73.0 in | Wt 176.0 lb

## 2024-05-29 DIAGNOSIS — I739 Peripheral vascular disease, unspecified: Secondary | ICD-10-CM

## 2024-05-29 DIAGNOSIS — T8141XS Infection following a procedure, superficial incisional surgical site, sequela: Secondary | ICD-10-CM

## 2024-05-29 NOTE — Progress Notes (Signed)
 " POST OPERATIVE OFFICE NOTE    CC:  F/u for surgery  HPI:  This is a 78 y.o. male who is s/p  1.  Left lower extremity thromboembolectomy via common femoral and below-knee popliteal artery exposure incisions using 3 and 4 Fogarty balloons 2.  Left tibioperoneal trunk endarterectomy with vein patch angioplasty 3.  Harvest left greater saphenous vein 4.  Left lower extremity angiogram 5.  Stent left SFA with 6 x 150 mm Eluvia  on 03/26/2024 by Dr. Sheree.    and 1.  Sharp excisional debridement left below-knee postsurgical wound including skin, subcutaneous tissue and fascia using 10 blade, cautery and medium curette to a total of 15 x 4 x 4 cm 2.  Washout left lower extremity postsurgical wound with 3 L of saline and 1 L of Vashe using pulse evacuation 3.  Application 38 cm Kerecis fish skin 4.  Application negative pressure dressing On 05/03/2024 by Dr. Sheree for LLE post surgical wound.   Pt was seen  by Dr. Serene 04/23/2024 for concerns of non healing wound.  He was found to have an incisional infection and the distal staples were removed and wound opened with a cotton tip applicator, which he was able to insert to its hub but this was more superficial and not going towards the graft.  Old hematoma was evacuated.  His wound was packed and he was started on Bactrim .  HH was ordered for dressing changes.  Pt was last seen in the hospital on 05/17/2024 and he had a palpable left PT pulse and wound vac in place with [facility] ordered.    Pt returns today for follow up for wound check and here with his wife.  Pt denies pain in his left foot.  His wife has multiple questions.  She states they are changing the wound vac weekly.  He states that he sits with his leg hanging down most of the day.  Swelling improved overnight.  He is compliant with Eliquis  and Plavix .   Pt has afib.     Allergies[1]  Current Outpatient Medications  Medication Sig Dispense Refill   apixaban  (ELIQUIS ) 5 MG TABS tablet  Take 5 mg by mouth 2 (two) times daily. (Patient not taking: Reported on 04/29/2024)     clopidogrel  (PLAVIX ) 75 MG tablet Take 1 tablet (75 mg total) by mouth daily.     docusate sodium  (COLACE) 100 MG capsule Take 100 mg by mouth daily as needed for mild constipation.     EPINEPHrine  (EPIPEN  IJ) Inject 1 application  as directed as needed (anaphylaxis).     metoprolol  succinate (TOPROL -XL) 25 MG 24 hr tablet Take 1 tablet (25 mg total) by mouth daily. 90 tablet 3   pantoprazole  (PROTONIX ) 40 MG tablet Take 1 tablet (40 mg total) by mouth 2 (two) times daily.     polyethylene glycol powder (GLYCOLAX /MIRALAX ) 17 GM/SCOOP powder Take 17 g by mouth daily as needed for mild constipation. Dissolve 1 capful (17g) in 4-8 ounces of liquid and take by mouth daily. (Patient not taking: Reported on 04/29/2024) 238 g 0   rosuvastatin  (CRESTOR ) 10 MG tablet Take 1 tablet (10 mg total) by mouth daily. 90 tablet 3   No current facility-administered medications for this visit.     ROS:  See HPI  Physical Exam:  Today's Vitals   05/29/24 1359  BP: 100/65  Pulse: (!) 116  Resp: (!) 22  Temp: 98.3 F (36.8 C)  TempSrc: Temporal  SpO2: 91%  Weight: 176 lb (79.8 kg)  Height: 6' 1 (1.854 m)   Body mass index is 23.22 kg/m.   Incision:    Extremities:  swelling LLE; monophasic left AT/PT/pero    Assessment/Plan:  This is a 78 y.o. male who is s/p: 1.  Left lower extremity thromboembolectomy via common femoral and below-knee popliteal artery exposure incisions using 3 and 4 Fogarty balloons 2.  Left tibioperoneal trunk endarterectomy with vein patch angioplasty 3.  Harvest left greater saphenous vein 4.  Left lower extremity angiogram 5.  Stent left SFA with 6 x 150 mm Eluvia  on 03/26/2024 by Dr. Sheree.    and 1.  Sharp excisional debridement left below-knee postsurgical wound including skin, subcutaneous tissue and fascia using 10 blade, cautery and medium curette to a total of 15 x 4 x 4  cm 2.  Washout left lower extremity postsurgical wound with 3 L of saline and 1 L of Vashe using pulse evacuation 3.  Application 38 cm Kerecis fish skin 4.  Application negative pressure dressing On 05/03/2024 by Dr. Sheree for LLE post surgical wound.   -pt with monophasic left AT/PT/pero doppler flow -left lower leg wound with good granulation tissue.  Pt did not have vac supplies today so wet to dry dressing placed and the vac will be replaced when he returns to [facility].  A small piece of xeroform was placed on left lateral leg skin tear and leg wrapped with gentle compression from toes to knee.   -pt will continue Eliquis /Plavix .  -return in 3-4 weeks for wound check.  If wound continues to progress, should not need vac much longer.   -encouraged pt to elevate his legs when not in therapy to help with swelling.    Lucie Apt, [facility] [phone]   Clinic MD:  Sheree     [1]  Allergies Allergen Reactions   Ace Inhibitors Anaphylaxis   Bactrim  Ds [Sulfamethoxazole -Trimethoprim ] Shortness Of Breath   Rythmol [Propafenone] Shortness Of Breath   Nitrofurantoin Rash   "

## 2024-06-03 ENCOUNTER — Encounter: Payer: Self-pay | Admitting: Surgery

## 2024-06-06 ENCOUNTER — Ambulatory Visit: Attending: Emergency Medicine | Admitting: Emergency Medicine

## 2024-06-06 ENCOUNTER — Encounter: Payer: Self-pay | Admitting: Emergency Medicine

## 2024-06-06 ENCOUNTER — Telehealth: Payer: Self-pay

## 2024-06-06 VITALS — BP 96/52 | HR 97 | Ht 73.0 in | Wt 174.0 lb

## 2024-06-06 DIAGNOSIS — I350 Nonrheumatic aortic (valve) stenosis: Secondary | ICD-10-CM | POA: Diagnosis present

## 2024-06-06 DIAGNOSIS — I10 Essential (primary) hypertension: Secondary | ICD-10-CM | POA: Insufficient documentation

## 2024-06-06 DIAGNOSIS — E785 Hyperlipidemia, unspecified: Secondary | ICD-10-CM | POA: Insufficient documentation

## 2024-06-06 DIAGNOSIS — I4821 Permanent atrial fibrillation: Secondary | ICD-10-CM | POA: Insufficient documentation

## 2024-06-06 DIAGNOSIS — I6523 Occlusion and stenosis of bilateral carotid arteries: Secondary | ICD-10-CM | POA: Diagnosis present

## 2024-06-06 NOTE — Progress Notes (Signed)
 " Cardiology Office Note:    Date:  06/06/2024  ID:  Kyle Kelley, DOB 1946/02/21, MRN [id number] PCP: Charlott Dorn LABOR, MD  [facility] Providers Cardiologist:  Wilbert Bihari, MD       Patient Profile:       Chief Complaint: Hospital follow-up History of Present Illness:  Kyle Kelley is a 79 y.o. male with visit-pertinent history of permanent atrial fibrillation on Eliquis , carotid artery disease, aortic stenosis, peripheral arterial disease with acute ischemic leg in 02/2024 (s/p thromboembolectomy, left tibioperoneal trunk endarterectomy with vein patch angioplasty, and stenting of left SFA), ICH in 02/2024, hypertension, hyperlipidemia, cancer s/p prostatectomy and tobacco abuse  Patient is followed by Dr. Bihari.  He is primarily followed by cardiology for atrial fibrillation which has been considered chronic/permanent since at least 2014.  He was admitted from 02/27/2024 to 01/30/2024 for ICH after presenting with imbalance and dizziness.  Neurology was consulted and Eliquis  was held.  He did not require surgery.  He was seen by neurology as an outpatient on 03/25/2024 and given the okay to restart Eliquis .  On his way home from the appointment he did develop severe left leg pain and presented to the ED where he was found to have acute ischemic leg.  He was taken to the OR and underwent left lower extremity thromboembolectomy via common femoral and below the knee popliteal artery exposure, left tibioperoneal trunk endarterectomy with vein patch angioplasty, and stenting of left SFA.  Echocardiogram during the admission showed LVEF of 55 to 60%, no RWMA, mildly reduced RV function, no significant valvular disease.  He was discharged on Plavix  and Eliquis .  He then developed an infected hematoma on the lower aspect of his distal incision.  He was then seen by vascular surgery on 04/29/2024 and he was recommended to go to the OR for washout and likely VAC placement.  His hemoglobin was  6.2 and he reported several weeks of dark stools after starting on Plavix  in addition to Eliquis .  He received packed red blood cells and GI was consulted.  He underwent EGD on 12/6 which showed a duodenal ulcer.  Hospital course was complicated by worsening abdominal pain, he was also found to have perforated duodenal ulcer and was taken to the OR on 12/9 status post ex lap and Midwife.  He was discharged in stable condition on 05/20/2024.  He was continued on metoprolol  and Eliquis .   Discussed the use of AI scribe software for clinical note transcription with the patient, who gave verbal consent to proceed.  History of Present Illness Kyle Kelley is a 79 year old male with atrial fibrillation and carotid artery stenosis who presents for cardiovascular follow-up.  Today he tells me he is doing well overall.  Currently residing in a rehab facility working with PT daily.  Continues to experience fatigue but this has been improving.  Contributes this to being bedridden recently.  He has had no falls at the rehabilitation facility.  He denies any hematuria, melena, or hematochezia.  Overall he denies any palpitations or episodes of tachycardia.  Reports his blood pressure and palpitations are well-controlled at his rehabilitation facility.  He denies chest pains or exertional dyspnea.  No orthopnea, PND.  Has chronic LLE at this time due to recent immobility but this has been improving.  No syncope, presyncope, lightheadedness, or dizziness.   Review of systems:  Please see the history of present illness. All other systems are reviewed and otherwise  negative.      Studies Reviewed:    EKG Interpretation Date/Time:  Thursday June 06 2024 11:12:52 EST Ventricular Rate:  97 PR Interval:    QRS Duration:  78 QT Interval:  326 QTC Calculation: 414 R Axis:   39  Text Interpretation: Atrial fibrillation Septal infarct , age undetermined When compared with ECG of 25-Mar-2024  21:09, PREVIOUS ECG IS PRESENT Confirmed by Rana Dixon [id number]) on 06/06/2024 11:16:07 AM    Echocardiogram 03/26/2024  1. Left ventricular ejection fraction, by estimation, is 55 to 60%. The  left ventricle has normal function. The left ventricle has no regional  wall motion abnormalities. Left ventricular diastolic parameters are  indeterminate.   2. Right ventricular systolic function is mildly reduced. The right  ventricular size is mildly enlarged.   3. The mitral valve is grossly normal. No evidence of mitral valve  regurgitation.   4. The aortic valve was not well visualized. Aortic valve regurgitation  is not visualized. Aortic valve sclerosis is present, with no evidence of  aortic valve stenosis.   5. The inferior vena cava is normal in size with greater than 50%  respiratory variability, suggesting right atrial pressure of 3 mmHg.   Echocardiogram 12/26/2023 1. Left ventricular ejection fraction, by estimation, is 55 to 60%. The  left ventricle has normal function. The left ventricle has no regional  wall motion abnormalities. Left ventricular diastolic parameters are  indeterminate.   2. Right ventricular systolic function is normal. The right ventricular  size is mildly enlarged.   3. Left atrial size was mildly dilated.   4. Right atrial size was severely dilated.   5. The mitral valve is normal in structure. No evidence of mitral valve  regurgitation. No evidence of mitral stenosis. Moderate mitral annular  calcification.   6. Tricuspid valve regurgitation is moderate to severe.   7. The aortic valve is calcified. Aortic valve regurgitation is not  visualized. Mild to moderate Aortic stenosis vs paradoxical low flow low  gradient moderate aortic stenosis. Aortic valve area, by VTI measures 1.02  cm. Aortic valve mean gradient  measures 10.8 mmHg. Aortic valve Vmax measures 2.20 m/s, SVI 28, DI 0.3   8. There is mild dilatation of the ascending aorta, measuring 38  mm.   9. The inferior vena cava is normal in size with greater than 50%  respiratory variability, suggesting right atrial pressure of 3 mmHg.   Carotid duplex 12/14/2023 Right Carotid: The extracranial vessels were near-normal with only minimal  wall                thickening or plaque. Minimal heterogeneous plaque note in  the                CCA bifurcation.   Left Carotid: The extracranial vessels were near-normal with only minimal  wall               thickening or plaque. Minimal heterogeneous plaque note in  the CCA                bifurcation.   Vertebrals:  Bilateral vertebral arteries demonstrate antegrade flow.  Subclavians: Normal flow hemodynamics were seen in bilateral subclavian               arteries.   Echocardiogram 12/22/2022  1. Left ventricular ejection fraction, by estimation, is 60 to 65%. The  left ventricle has normal function. The left ventricle has no regional  wall motion abnormalities. Left ventricular diastolic  function could not  be evaluated.   2. Right ventricular systolic function is normal. The right ventricular  size is normal. There is mildly elevated pulmonary artery systolic  pressure.   3. Right atrial size was moderately dilated.   4. The mitral valve is normal in structure. No evidence of mitral valve  regurgitation. No evidence of mitral stenosis.   5. The aortic valve is normal in structure. There is moderate  calcification of the aortic valve. There is moderate thickening of the  aortic valve. Aortic valve regurgitation is not visualized. Mild aortic  valve stenosis.   6. The inferior vena cava is dilated in size with <50% respiratory  variability, suggesting right atrial pressure of 15 mmHg.   Risk Assessment/Calculations:    CHA2DS2-VASc Score = 6   This indicates a 9.7% annual risk of stroke. The patient's score is based upon: CHF History: 0 HTN History: 1 Diabetes History: 0 Stroke History: 2 Vascular Disease History: 1 Age  Score: 2 Gender Score: 0             Physical Exam:   VS:  BP (!) 96/52   Pulse 97   Ht 6' 1 (1.854 m)   Wt 174 lb (78.9 kg)   BMI 22.96 kg/m    Wt Readings from Last 3 Encounters:  06/06/24 174 lb (78.9 kg)  05/29/24 176 lb (79.8 kg)  05/20/24 176 lb 5.9 oz (80 kg)    GEN: Well nourished, well developed in no acute distress NECK: No JVD; No carotid bruits CARDIAC: Irregularly irregular, no murmurs, rubs, gallops RESPIRATORY:  Clear to auscultation without rales, wheezing or rhonchi  ABDOMEN: Soft, non-tender, non-distended EXTREMITIES: Slight bilateral pedal edema     Assessment and Plan:  Permanent atrial fibrillation Long history of atrial fibrillation dating back to at least 2014 History of ICH in 02/2024 (cleared by neurology to restart anticoagulation) and GI bleed with perforated duodenal ulcer on 04/2024 s/p repair (okay to continue coagulation per GI).  Developed acute ischemic leg off of Eliquis  on 02/2024 - Today he remains in rate controlled A-fib at 97 bpm - He remains cardiac unaware of his arrhythmia - He has been without any recent falls and currently denies bleeding - He is scheduled to see EP on 1/20 to discuss Watchman device given his recent various complications with blood thinners - Continue Eliquis  5 mg twice daily - Continue metoprolol  succinate 25 mg daily  Hypertension Blood pressure today is 96/52 Average blood pressure at rehabilitation center is typically 120s over 70s - Continue metoprolol  succinate 25 mg daily - No lightheadedness, dizziness, syncope or presyncope  Aortic stenosis Echocardiogram in 11/2023 showed mild to moderate aortic stenosis versus paradoxical low-flow low gradient moderate stenosis (mean gradient 10.8 mmHg and DI 0.3).  However, repeat echocardiogram in 02/2024 showed no evidence of aortic stenosis - He remains asymptomatic without chest discomfort on exertion, dyspnea on exertion, or syncope - Plan for repeat  echocardiogram on 02/2025 for routine monitoring  Carotid artery stenosis Carotid duplex 11/2022 showed bilateral 1-39% ICA stenosis Carotid duplex 11/2023 showed bilateral minimal plaque - He is without any new neurological symptoms today - Continue rosuvastatin  10 mg daily  Hyperlipidemia, LDL goal <70 LDL 42 on 02/2024 well-controlled - Continue rosuvastatin  10 mg daily  Peripheral arterial disease Admitted 02/2024 with acute ischemic leg and underwent thromboembolectomy,  left tibioperoneal trunk endarterectomy with vein patch angioplasty, and stenting of left SFA  - Management per vascular surgery - Managed on Eliquis   and Plavix       Dispo:  Return in about 3 months (around 09/04/2024).  Signed, Lum LITTIE Louis, NP  "

## 2024-06-06 NOTE — Telephone Encounter (Signed)
 Verbal order given Burnard, RN  [place]  Verbal order given to Burnard to discontinue wound vac to patient's left lower extremity and begin daily wet-to-dry dressings.  Verbal order given to clean wound with saline daily. Verbal order given to pack the slit at the proximal end of the wound with iodoform gauze daily.

## 2024-06-06 NOTE — Patient Instructions (Signed)
 Medication Instructions:  NO CHANGES  Lab Work: NONE TO BE DONE TODAY.  Testing/Procedures: NONE   Follow-Up: At [facility], you and your health needs are our priority.  As part of our continuing mission to provide you with exceptional heart care, our providers are all part of one team.  This team includes your primary Cardiologist (physician) and Advanced Practice Providers or APPs (Physician Assistants and Nurse Practitioners) who all work together to provide you with the care you need, when you need it.  Your next appointment:   3 MONTHS  Provider:   Wilbert Bihari, MD

## 2024-06-16 ENCOUNTER — Inpatient Hospital Stay (HOSPITAL_COMMUNITY)

## 2024-06-16 ENCOUNTER — Inpatient Hospital Stay (HOSPITAL_COMMUNITY)
Admission: EM | Admit: 2024-06-16 | Discharge: 2024-07-01 | DRG: 871 | Disposition: A | Discharge status: Skilled Nursing Facility | Source: Skilled Nursing Facility | Attending: Internal Medicine | Admitting: Internal Medicine

## 2024-06-16 ENCOUNTER — Emergency Department (HOSPITAL_COMMUNITY)

## 2024-06-16 DIAGNOSIS — I739 Peripheral vascular disease, unspecified: Secondary | ICD-10-CM | POA: Diagnosis present

## 2024-06-16 DIAGNOSIS — E872 Acidosis, unspecified: Secondary | ICD-10-CM | POA: Diagnosis present

## 2024-06-16 DIAGNOSIS — I4821 Permanent atrial fibrillation: Secondary | ICD-10-CM | POA: Diagnosis present

## 2024-06-16 DIAGNOSIS — N179 Acute kidney failure, unspecified: Secondary | ICD-10-CM | POA: Diagnosis present

## 2024-06-16 DIAGNOSIS — Z7901 Long term (current) use of anticoagulants: Secondary | ICD-10-CM

## 2024-06-16 DIAGNOSIS — E785 Hyperlipidemia, unspecified: Secondary | ICD-10-CM | POA: Diagnosis present

## 2024-06-16 DIAGNOSIS — Z85828 Personal history of other malignant neoplasm of skin: Secondary | ICD-10-CM

## 2024-06-16 DIAGNOSIS — R1310 Dysphagia, unspecified: Secondary | ICD-10-CM | POA: Diagnosis not present

## 2024-06-16 DIAGNOSIS — A414 Sepsis due to anaerobes: Secondary | ICD-10-CM | POA: Diagnosis not present

## 2024-06-16 DIAGNOSIS — I350 Nonrheumatic aortic (valve) stenosis: Secondary | ICD-10-CM | POA: Diagnosis present

## 2024-06-16 DIAGNOSIS — Z681 Body mass index (BMI) 19 or less, adult: Secondary | ICD-10-CM

## 2024-06-16 DIAGNOSIS — R64 Cachexia: Secondary | ICD-10-CM | POA: Diagnosis present

## 2024-06-16 DIAGNOSIS — R739 Hyperglycemia, unspecified: Secondary | ICD-10-CM | POA: Diagnosis present

## 2024-06-16 DIAGNOSIS — K922 Gastrointestinal hemorrhage, unspecified: Secondary | ICD-10-CM | POA: Diagnosis not present

## 2024-06-16 DIAGNOSIS — J9601 Acute respiratory failure with hypoxia: Secondary | ICD-10-CM | POA: Diagnosis present

## 2024-06-16 DIAGNOSIS — I5020 Unspecified systolic (congestive) heart failure: Secondary | ICD-10-CM | POA: Diagnosis not present

## 2024-06-16 DIAGNOSIS — Z7902 Long term (current) use of antithrombotics/antiplatelets: Secondary | ICD-10-CM | POA: Diagnosis not present

## 2024-06-16 DIAGNOSIS — G928 Other toxic encephalopathy: Secondary | ICD-10-CM | POA: Diagnosis present

## 2024-06-16 DIAGNOSIS — I480 Paroxysmal atrial fibrillation: Secondary | ICD-10-CM | POA: Diagnosis not present

## 2024-06-16 DIAGNOSIS — R410 Disorientation, unspecified: Secondary | ICD-10-CM

## 2024-06-16 DIAGNOSIS — L89150 Pressure ulcer of sacral region, unstageable: Secondary | ICD-10-CM | POA: Diagnosis present

## 2024-06-16 DIAGNOSIS — Z888 Allergy status to other drugs, medicaments and biological substances status: Secondary | ICD-10-CM

## 2024-06-16 DIAGNOSIS — J810 Acute pulmonary edema: Secondary | ICD-10-CM | POA: Diagnosis not present

## 2024-06-16 DIAGNOSIS — Z8249 Family history of ischemic heart disease and other diseases of the circulatory system: Secondary | ICD-10-CM

## 2024-06-16 DIAGNOSIS — I110 Hypertensive heart disease with heart failure: Secondary | ICD-10-CM | POA: Diagnosis present

## 2024-06-16 DIAGNOSIS — R7401 Elevation of levels of liver transaminase levels: Secondary | ICD-10-CM | POA: Diagnosis not present

## 2024-06-16 DIAGNOSIS — I5023 Acute on chronic systolic (congestive) heart failure: Secondary | ICD-10-CM | POA: Diagnosis present

## 2024-06-16 DIAGNOSIS — R6521 Severe sepsis with septic shock: Secondary | ICD-10-CM | POA: Diagnosis present

## 2024-06-16 DIAGNOSIS — Z881 Allergy status to other antibiotic agents status: Secondary | ICD-10-CM

## 2024-06-16 DIAGNOSIS — R54 Age-related physical debility: Secondary | ICD-10-CM | POA: Diagnosis present

## 2024-06-16 DIAGNOSIS — Z1152 Encounter for screening for COVID-19: Secondary | ICD-10-CM

## 2024-06-16 DIAGNOSIS — A419 Sepsis, unspecified organism: Secondary | ICD-10-CM | POA: Diagnosis present

## 2024-06-16 DIAGNOSIS — J189 Pneumonia, unspecified organism: Secondary | ICD-10-CM | POA: Diagnosis present

## 2024-06-16 DIAGNOSIS — Z9109 Other allergy status, other than to drugs and biological substances: Secondary | ICD-10-CM

## 2024-06-16 DIAGNOSIS — R7989 Other specified abnormal findings of blood chemistry: Secondary | ICD-10-CM

## 2024-06-16 DIAGNOSIS — I482 Chronic atrial fibrillation, unspecified: Secondary | ICD-10-CM | POA: Diagnosis not present

## 2024-06-16 DIAGNOSIS — D509 Iron deficiency anemia, unspecified: Secondary | ICD-10-CM | POA: Diagnosis present

## 2024-06-16 DIAGNOSIS — R600 Localized edema: Secondary | ICD-10-CM | POA: Diagnosis not present

## 2024-06-16 DIAGNOSIS — I509 Heart failure, unspecified: Secondary | ICD-10-CM | POA: Diagnosis not present

## 2024-06-16 DIAGNOSIS — J690 Pneumonitis due to inhalation of food and vomit: Secondary | ICD-10-CM | POA: Diagnosis present

## 2024-06-16 DIAGNOSIS — Z8546 Personal history of malignant neoplasm of prostate: Secondary | ICD-10-CM

## 2024-06-16 DIAGNOSIS — E871 Hypo-osmolality and hyponatremia: Secondary | ICD-10-CM | POA: Diagnosis present

## 2024-06-16 DIAGNOSIS — Y95 Nosocomial condition: Secondary | ICD-10-CM | POA: Diagnosis present

## 2024-06-16 DIAGNOSIS — R570 Cardiogenic shock: Secondary | ICD-10-CM | POA: Diagnosis present

## 2024-06-16 DIAGNOSIS — K5900 Constipation, unspecified: Secondary | ICD-10-CM | POA: Diagnosis present

## 2024-06-16 DIAGNOSIS — G9340 Encephalopathy, unspecified: Secondary | ICD-10-CM | POA: Diagnosis not present

## 2024-06-16 DIAGNOSIS — E878 Other disorders of electrolyte and fluid balance, not elsewhere classified: Secondary | ICD-10-CM | POA: Diagnosis not present

## 2024-06-16 DIAGNOSIS — I4891 Unspecified atrial fibrillation: Secondary | ICD-10-CM

## 2024-06-16 DIAGNOSIS — D649 Anemia, unspecified: Secondary | ICD-10-CM

## 2024-06-16 DIAGNOSIS — Z8673 Personal history of transient ischemic attack (TIA), and cerebral infarction without residual deficits: Secondary | ICD-10-CM

## 2024-06-16 DIAGNOSIS — I10 Essential (primary) hypertension: Secondary | ICD-10-CM | POA: Diagnosis not present

## 2024-06-16 DIAGNOSIS — I5021 Acute systolic (congestive) heart failure: Secondary | ICD-10-CM | POA: Diagnosis not present

## 2024-06-16 DIAGNOSIS — R579 Shock, unspecified: Secondary | ICD-10-CM | POA: Diagnosis not present

## 2024-06-16 DIAGNOSIS — R578 Other shock: Secondary | ICD-10-CM | POA: Diagnosis not present

## 2024-06-16 DIAGNOSIS — Z79899 Other long term (current) drug therapy: Secondary | ICD-10-CM

## 2024-06-16 DIAGNOSIS — J440 Chronic obstructive pulmonary disease with acute lower respiratory infection: Secondary | ICD-10-CM | POA: Diagnosis present

## 2024-06-16 DIAGNOSIS — J90 Pleural effusion, not elsewhere classified: Secondary | ICD-10-CM | POA: Diagnosis not present

## 2024-06-16 DIAGNOSIS — E876 Hypokalemia: Secondary | ICD-10-CM | POA: Diagnosis not present

## 2024-06-16 DIAGNOSIS — Z86718 Personal history of other venous thrombosis and embolism: Secondary | ICD-10-CM

## 2024-06-16 DIAGNOSIS — E43 Unspecified severe protein-calorie malnutrition: Secondary | ICD-10-CM | POA: Diagnosis present

## 2024-06-16 DIAGNOSIS — F17210 Nicotine dependence, cigarettes, uncomplicated: Secondary | ICD-10-CM | POA: Diagnosis present

## 2024-06-16 LAB — AMMONIA: Ammonia: 39 umol/L — ABNORMAL HIGH (ref 9–35)

## 2024-06-16 LAB — CBC
HCT: 29.9 % — ABNORMAL LOW (ref 39.0–52.0)
Hemoglobin: 8.9 g/dL — ABNORMAL LOW (ref 13.0–17.0)
MCH: 23.8 pg — ABNORMAL LOW (ref 26.0–34.0)
MCHC: 29.8 g/dL — ABNORMAL LOW (ref 30.0–36.0)
MCV: 79.9 fL — ABNORMAL LOW (ref 80.0–100.0)
Platelets: 341 K/uL (ref 150–400)
RBC: 3.74 MIL/uL — ABNORMAL LOW (ref 4.22–5.81)
RDW: 19 % — ABNORMAL HIGH (ref 11.5–15.5)
WBC: 25.1 K/uL — ABNORMAL HIGH (ref 4.0–10.5)
nRBC: 0.1 % (ref 0.0–0.2)

## 2024-06-16 LAB — I-STAT VENOUS BLOOD GAS, ED
Acid-Base Excess: 11 mmol/L — ABNORMAL HIGH (ref 0.0–2.0)
Bicarbonate: 35.5 mmol/L — ABNORMAL HIGH (ref 20.0–28.0)
Calcium, Ion: 0.87 mmol/L — CL (ref 1.15–1.40)
HCT: 32 % — ABNORMAL LOW (ref 39.0–52.0)
Hemoglobin: 10.9 g/dL — ABNORMAL LOW (ref 13.0–17.0)
O2 Saturation: 99 %
Potassium: 3.8 mmol/L (ref 3.5–5.1)
Sodium: 130 mmol/L — ABNORMAL LOW (ref 135–145)
TCO2: 37 mmol/L — ABNORMAL HIGH (ref 22–32)
pCO2, Ven: 43.9 mmHg — ABNORMAL LOW (ref 44–60)
pH, Ven: 7.516 — ABNORMAL HIGH (ref 7.25–7.43)
pO2, Ven: 137 mmHg — ABNORMAL HIGH (ref 32–45)

## 2024-06-16 LAB — I-STAT CG4 LACTIC ACID, ED
Lactic Acid, Venous: 4.4 mmol/L (ref 0.5–1.9)
Lactic Acid, Venous: 4.7 mmol/L (ref 0.5–1.9)

## 2024-06-16 LAB — SODIUM, URINE, RANDOM: Sodium, Ur: <30 mmol/L

## 2024-06-16 LAB — COMPREHENSIVE METABOLIC PANEL WITH GFR
ALT: 272 U/L — ABNORMAL HIGH (ref 0–44)
AST: 330 U/L — ABNORMAL HIGH (ref 15–41)
Albumin: 2.5 g/dL — ABNORMAL LOW (ref 3.5–5.0)
Alkaline Phosphatase: 70 U/L (ref 38–126)
Anion gap: 12 (ref 5–15)
BUN: 44 mg/dL — ABNORMAL HIGH (ref 8–23)
CO2: 33 mmol/L — ABNORMAL HIGH (ref 22–32)
Calcium: 8.1 mg/dL — ABNORMAL LOW (ref 8.9–10.3)
Chloride: 89 mmol/L — ABNORMAL LOW (ref 98–111)
Creatinine, Ser: 1.44 mg/dL — ABNORMAL HIGH (ref 0.61–1.24)
GFR, Estimated: 50 mL/min — ABNORMAL LOW
Glucose, Bld: 93 mg/dL (ref 70–99)
Potassium: 3.9 mmol/L (ref 3.5–5.1)
Sodium: 133 mmol/L — ABNORMAL LOW (ref 135–145)
Total Bilirubin: 0.5 mg/dL (ref 0.0–1.2)
Total Protein: 7.1 g/dL (ref 6.5–8.1)

## 2024-06-16 LAB — CBC WITH DIFFERENTIAL/PLATELET
Abs Immature Granulocytes: 0.08 K/uL — ABNORMAL HIGH (ref 0.00–0.07)
Basophils Absolute: 0 K/uL (ref 0.0–0.1)
Basophils Relative: 0 %
Eosinophils Absolute: 0 K/uL (ref 0.0–0.5)
Eosinophils Relative: 0 %
HCT: 32.2 % — ABNORMAL LOW (ref 39.0–52.0)
Hemoglobin: 9.3 g/dL — ABNORMAL LOW (ref 13.0–17.0)
Immature Granulocytes: 0 %
Lymphocytes Relative: 1 %
Lymphs Abs: 0.3 K/uL — ABNORMAL LOW (ref 0.7–4.0)
MCH: 23.7 pg — ABNORMAL LOW (ref 26.0–34.0)
MCHC: 28.9 g/dL — ABNORMAL LOW (ref 30.0–36.0)
MCV: 82.1 fL (ref 80.0–100.0)
Monocytes Absolute: 0.8 K/uL (ref 0.1–1.0)
Monocytes Relative: 3 %
Neutro Abs: 23 K/uL — ABNORMAL HIGH (ref 1.7–7.7)
Neutrophils Relative %: 96 %
Platelets: 334 K/uL (ref 150–400)
RBC: 3.92 MIL/uL — ABNORMAL LOW (ref 4.22–5.81)
RDW: 18.7 % — ABNORMAL HIGH (ref 11.5–15.5)
Smear Review: NORMAL
WBC: 24.3 K/uL — ABNORMAL HIGH (ref 4.0–10.5)
nRBC: 0 % (ref 0.0–0.2)

## 2024-06-16 LAB — I-STAT ARTERIAL BLOOD GAS, ED
Acid-Base Excess: 10 mmol/L — ABNORMAL HIGH (ref 0.0–2.0)
Bicarbonate: 35.5 mmol/L — ABNORMAL HIGH (ref 20.0–28.0)
Calcium, Ion: 1.04 mmol/L — ABNORMAL LOW (ref 1.15–1.40)
HCT: 29 % — ABNORMAL LOW (ref 39.0–52.0)
Hemoglobin: 9.9 g/dL — ABNORMAL LOW (ref 13.0–17.0)
O2 Saturation: 98 %
Patient temperature: 102
Potassium: 3.5 mmol/L (ref 3.5–5.1)
Sodium: 131 mmol/L — ABNORMAL LOW (ref 135–145)
TCO2: 37 mmol/L — ABNORMAL HIGH (ref 22–32)
pCO2 arterial: 59.8 mmHg — ABNORMAL HIGH (ref 32–48)
pH, Arterial: 7.39 (ref 7.35–7.45)
pO2, Arterial: 126 mmHg — ABNORMAL HIGH (ref 83–108)

## 2024-06-16 LAB — I-STAT CHEM 8, ED
BUN: 50 mg/dL — ABNORMAL HIGH (ref 8–23)
Calcium, Ion: 0.87 mmol/L — CL (ref 1.15–1.40)
Chloride: 89 mmol/L — ABNORMAL LOW (ref 98–111)
Creatinine, Ser: 1.6 mg/dL — ABNORMAL HIGH (ref 0.61–1.24)
Glucose, Bld: 96 mg/dL (ref 70–99)
HCT: 33 % — ABNORMAL LOW (ref 39.0–52.0)
Hemoglobin: 11.2 g/dL — ABNORMAL LOW (ref 13.0–17.0)
Potassium: 3.8 mmol/L (ref 3.5–5.1)
Sodium: 130 mmol/L — ABNORMAL LOW (ref 135–145)
TCO2: 34 mmol/L — ABNORMAL HIGH (ref 22–32)

## 2024-06-16 LAB — PROTEIN / CREATININE RATIO, URINE
Creatinine, Urine: 111 mg/dL
Protein Creatinine Ratio: 0.8 mg/mg — ABNORMAL HIGH
Total Protein, Urine: 92 mg/dL

## 2024-06-16 LAB — OSMOLALITY, URINE: Osmolality, Ur: 350 mosm/kg (ref 300–900)

## 2024-06-16 LAB — URINALYSIS, W/ REFLEX TO CULTURE (INFECTION SUSPECTED)
Glucose, UA: NEGATIVE mg/dL
Ketones, ur: NEGATIVE mg/dL
Nitrite: NEGATIVE
Protein, ur: 30 mg/dL — AB
Specific Gravity, Urine: >1.03 — ABNORMAL HIGH (ref 1.005–1.030)
pH: 5 (ref 5.0–8.0)

## 2024-06-16 LAB — TYPE AND SCREEN
ABO/RH(D): O POS
Antibody Screen: NEGATIVE

## 2024-06-16 LAB — HEPATITIS PANEL, ACUTE
HCV Ab: NONREACTIVE
Hep A IgM: NONREACTIVE
Hep B C IgM: NONREACTIVE
Hepatitis B Surface Ag: NONREACTIVE

## 2024-06-16 LAB — RESP PANEL BY RT-PCR (RSV, FLU A&B, COVID)  RVPGX2
Influenza A by PCR: NEGATIVE
Influenza B by PCR: NEGATIVE
Resp Syncytial Virus by PCR: NEGATIVE
SARS Coronavirus 2 by RT PCR: NEGATIVE

## 2024-06-16 LAB — BASIC METABOLIC PANEL WITH GFR
Anion gap: 10 (ref 5–15)
BUN: 48 mg/dL — ABNORMAL HIGH (ref 8–23)
CO2: 33 mmol/L — ABNORMAL HIGH (ref 22–32)
Calcium: 7.9 mg/dL — ABNORMAL LOW (ref 8.9–10.3)
Chloride: 86 mmol/L — ABNORMAL LOW (ref 98–111)
Creatinine, Ser: 1.39 mg/dL — ABNORMAL HIGH (ref 0.61–1.24)
GFR, Estimated: 53 mL/min — ABNORMAL LOW
Glucose, Bld: 148 mg/dL — ABNORMAL HIGH (ref 70–99)
Potassium: 3.6 mmol/L (ref 3.5–5.1)
Sodium: 130 mmol/L — ABNORMAL LOW (ref 135–145)

## 2024-06-16 LAB — PRO BRAIN NATRIURETIC PEPTIDE: Pro Brain Natriuretic Peptide: 8376 pg/mL — ABNORMAL HIGH

## 2024-06-16 LAB — PROTIME-INR
INR: 2.7 — ABNORMAL HIGH (ref 0.8–1.2)
Prothrombin Time: 29.7 s — ABNORMAL HIGH (ref 11.4–15.2)

## 2024-06-16 LAB — STREP PNEUMONIAE URINARY ANTIGEN: Strep Pneumo Urinary Antigen: NEGATIVE

## 2024-06-16 LAB — GLUCOSE, CAPILLARY: Glucose-Capillary: 148 mg/dL — ABNORMAL HIGH (ref 70–99)

## 2024-06-16 LAB — OSMOLALITY: Osmolality: 302 mosm/kg — ABNORMAL HIGH (ref 275–295)

## 2024-06-16 MED ORDER — SODIUM CHLORIDE 0.9 % IV SOLN: 500.0000 mg | Freq: Once | INTRAVENOUS | Status: DC

## 2024-06-16 MED ORDER — IOHEXOL 350 MG/ML SOLN
75.0000 mL | Freq: Once | INTRAVENOUS | Status: AC | PRN
  Administered 2024-06-16: 75 mL via INTRAVENOUS

## 2024-06-16 MED ORDER — PIPERACILLIN-TAZOBACTAM 3.375 G IVPB
3.3750 g | Freq: Three times a day (TID) | INTRAVENOUS | Status: DC
  Administered 2024-06-16 – 2024-06-17 (×2): 3.375 g via INTRAVENOUS
  Filled 2024-06-16 (×2): qty 50

## 2024-06-16 MED ORDER — CHLORHEXIDINE GLUCONATE CLOTH 2 % EX PADS
6.0000 | MEDICATED_PAD | Freq: Every day | CUTANEOUS | Status: DC
  Administered 2024-06-16 – 2024-06-26 (×11): 6 via TOPICAL

## 2024-06-16 MED ORDER — NOREPINEPHRINE 16 MG/250ML-% IV SOLN: 0.0000 ug/min | INTRAVENOUS | Status: DC

## 2024-06-16 MED ORDER — MAGNESIUM SULFATE 2 GM/50ML IV SOLN
2.0000 g | Freq: Once | INTRAVENOUS | Status: AC
  Administered 2024-06-16: 2 g via INTRAVENOUS
  Filled 2024-06-16: qty 50

## 2024-06-16 MED ORDER — ONDANSETRON HCL 4 MG/2ML IJ SOLN: 4.0000 mg | Freq: Four times a day (QID) | INTRAMUSCULAR | Status: DC | PRN

## 2024-06-16 MED ORDER — NOREPINEPHRINE BITARTRATE 1 MG/ML IV SOLN
INTRAVENOUS | Status: AC | PRN
  Administered 2024-06-16: 10 ug via INTRAVENOUS

## 2024-06-16 MED ORDER — FENTANYL 2500MCG IN NS 250ML (10MCG/ML) PREMIX INFUSION
0.0000 ug/h | INTRAVENOUS | Status: DC
  Administered 2024-06-16 – 2024-06-17 (×2): 100 ug/h via INTRAVENOUS
  Filled 2024-06-16 (×2): qty 250

## 2024-06-16 MED ORDER — ETOMIDATE 2 MG/ML IV SOLN
INTRAVENOUS | Status: AC | PRN
  Administered 2024-06-16: 20 mg via INTRAVENOUS

## 2024-06-16 MED ORDER — ACETAMINOPHEN 650 MG RE SUPP
650.0000 mg | Freq: Once | RECTAL | Status: AC
  Administered 2024-06-16: 650 mg via RECTAL
  Filled 2024-06-16: qty 1

## 2024-06-16 MED ORDER — IPRATROPIUM-ALBUTEROL 0.5-2.5 (3) MG/3ML IN SOLN
3.0000 mL | Freq: Four times a day (QID) | RESPIRATORY_TRACT | Status: DC | PRN
  Administered 2024-06-17: 3 mL via RESPIRATORY_TRACT
  Filled 2024-06-16: qty 3

## 2024-06-16 MED ORDER — SODIUM CHLORIDE 0.9 % IV SOLN
2.0000 g | Freq: Two times a day (BID) | INTRAVENOUS | Status: DC
  Administered 2024-06-17 – 2024-06-18 (×3): 2 g via INTRAVENOUS
  Filled 2024-06-16 (×3): qty 12.5

## 2024-06-16 MED ORDER — ORAL CARE MOUTH RINSE
15.0000 mL | OROMUCOSAL | Status: DC
  Administered 2024-06-16 – 2024-06-22 (×62): 15 mL via OROMUCOSAL

## 2024-06-16 MED ORDER — FENTANYL BOLUS VIA INFUSION
25.0000 ug | INTRAVENOUS | Status: DC | PRN
  Administered 2024-06-17 (×3): 25 ug via INTRAVENOUS
  Administered 2024-06-17: 50 ug via INTRAVENOUS
  Administered 2024-06-18 (×2): 25 ug via INTRAVENOUS
  Administered 2024-06-18: 50 ug via INTRAVENOUS
  Administered 2024-06-18: 25 ug via INTRAVENOUS
  Administered 2024-06-18 – 2024-06-19 (×3): 50 ug via INTRAVENOUS
  Administered 2024-06-19 (×2): 25 ug via INTRAVENOUS

## 2024-06-16 MED ORDER — LACTATED RINGERS IV BOLUS (SEPSIS)
1000.0000 mL | Freq: Once | INTRAVENOUS | Status: AC
  Administered 2024-06-16: 1000 mL via INTRAVENOUS

## 2024-06-16 MED ORDER — INSULIN ASPART 100 UNIT/ML IJ SOLN
0.0000 [IU] | INTRAMUSCULAR | Status: DC
  Administered 2024-06-16 – 2024-06-17 (×2): 1 [IU] via SUBCUTANEOUS
  Administered 2024-06-17 (×2): 2 [IU] via SUBCUTANEOUS
  Administered 2024-06-17 (×2): 1 [IU] via SUBCUTANEOUS
  Administered 2024-06-17 – 2024-06-18 (×4): 2 [IU] via SUBCUTANEOUS
  Administered 2024-06-18 – 2024-06-19 (×6): 1 [IU] via SUBCUTANEOUS
  Administered 2024-06-19 – 2024-06-20 (×3): 2 [IU] via SUBCUTANEOUS
  Administered 2024-06-21: 3 [IU] via SUBCUTANEOUS
  Administered 2024-06-21: 2 [IU] via SUBCUTANEOUS
  Administered 2024-06-21 – 2024-06-22 (×3): 1 [IU] via SUBCUTANEOUS
  Filled 2024-06-16: qty 2
  Filled 2024-06-16 (×2): qty 1
  Filled 2024-06-16: qty 2
  Filled 2024-06-16: qty 1
  Filled 2024-06-16 (×2): qty 2
  Filled 2024-06-16 (×2): qty 1
  Filled 2024-06-16: qty 2
  Filled 2024-06-16: qty 1
  Filled 2024-06-16: qty 2
  Filled 2024-06-16 (×2): qty 1
  Filled 2024-06-16: qty 4
  Filled 2024-06-16: qty 2
  Filled 2024-06-16: qty 1
  Filled 2024-06-16: qty 2
  Filled 2024-06-16 (×3): qty 1
  Filled 2024-06-16: qty 2

## 2024-06-16 MED ORDER — SODIUM CHLORIDE 0.9 % IV SOLN: 250.0000 mL | INTRAVENOUS | Status: AC

## 2024-06-16 MED ORDER — VANCOMYCIN HCL 1500 MG/300ML IV SOLN
1500.0000 mg | Freq: Once | INTRAVENOUS | Status: AC
  Administered 2024-06-16: 1500 mg via INTRAVENOUS
  Filled 2024-06-16: qty 300

## 2024-06-16 MED ORDER — VANCOMYCIN VARIABLE DOSE PER UNSTABLE RENAL FUNCTION (PHARMACIST DOSING): Status: DC

## 2024-06-16 MED ORDER — VASOPRESSIN 20 UNITS/100 ML INFUSION FOR SHOCK
0.0000 [IU]/min | INTRAVENOUS | Status: DC
  Administered 2024-06-16 – 2024-06-19 (×8): 0.04 [IU]/min via INTRAVENOUS
  Filled 2024-06-16 (×9): qty 100

## 2024-06-16 MED ORDER — IPRATROPIUM-ALBUTEROL 0.5-2.5 (3) MG/3ML IN SOLN
3.0000 mL | Freq: Four times a day (QID) | RESPIRATORY_TRACT | Status: DC
  Administered 2024-06-16 – 2024-06-23 (×27): 3 mL via RESPIRATORY_TRACT
  Filled 2024-06-16 (×24): qty 3
  Filled 2024-06-16: qty 6
  Filled 2024-06-16 (×3): qty 3

## 2024-06-16 MED ORDER — AZITHROMYCIN 500 MG PO TABS: 500.0000 mg | ORAL_TABLET | Freq: Once | ORAL | Status: DC

## 2024-06-16 MED ORDER — NOREPINEPHRINE 4 MG/250ML-% IV SOLN
INTRAVENOUS | Status: AC | PRN
  Administered 2024-06-16: 20 ug/min via INTRAVENOUS

## 2024-06-16 MED ORDER — SODIUM CHLORIDE 0.9 % IV SOLN
2.0000 g | Freq: Once | INTRAVENOUS | Status: AC
  Administered 2024-06-16: 2 g via INTRAVENOUS
  Filled 2024-06-16: qty 12.5

## 2024-06-16 MED ORDER — ACETAMINOPHEN 325 MG PO TABS: 650.0000 mg | ORAL_TABLET | Freq: Once | ORAL | Status: DC

## 2024-06-16 MED ORDER — SODIUM CHLORIDE 0.9% FLUSH
10.0000 mL | Freq: Two times a day (BID) | INTRAVENOUS | Status: DC
  Administered 2024-06-16: 10 mL
  Administered 2024-06-17: 20 mL
  Administered 2024-06-17 – 2024-06-18 (×3): 10 mL
  Administered 2024-06-19: 30 mL
  Administered 2024-06-19 – 2024-06-20 (×2): 10 mL
  Administered 2024-06-20: 40 mL
  Administered 2024-06-21 – 2024-06-25 (×10): 10 mL
  Administered 2024-06-26: 30 mL
  Administered 2024-06-26 – 2024-07-01 (×9): 10 mL

## 2024-06-16 MED ORDER — VANCOMYCIN HCL IN DEXTROSE 1-5 GM/200ML-% IV SOLN
1000.0000 mg | Freq: Once | INTRAVENOUS | Status: DC
  Filled 2024-06-16: qty 200

## 2024-06-16 MED ORDER — PROPOFOL 1000 MG/100ML IV EMUL
0.0000 ug/kg/min | INTRAVENOUS | Status: DC
  Administered 2024-06-16: 10 ug/kg/min via INTRAVENOUS
  Administered 2024-06-17: 20 ug/kg/min via INTRAVENOUS
  Filled 2024-06-16 (×2): qty 100

## 2024-06-16 MED ORDER — SODIUM CHLORIDE 0.9% FLUSH: 10.0000 mL | INTRAVENOUS | Status: DC | PRN

## 2024-06-16 MED ORDER — NOREPINEPHRINE 4 MG/250ML-% IV SOLN
0.0000 ug/min | INTRAVENOUS | Status: DC
  Filled 2024-06-16: qty 250

## 2024-06-16 MED ORDER — ORAL CARE MOUTH RINSE: 15.0000 mL | OROMUCOSAL | Status: DC | PRN

## 2024-06-16 MED ORDER — FAMOTIDINE 20 MG PO TABS
20.0000 mg | ORAL_TABLET | Freq: Two times a day (BID) | ORAL | Status: DC
  Administered 2024-06-16: 20 mg
  Filled 2024-06-16 (×2): qty 1

## 2024-06-16 MED ORDER — ENOXAPARIN SODIUM 30 MG/0.3ML IJ SOSY
30.0000 mg | PREFILLED_SYRINGE | Freq: Every day | INTRAMUSCULAR | Status: DC
  Filled 2024-06-16: qty 0.3

## 2024-06-16 MED ORDER — ROCURONIUM BROMIDE 10 MG/ML (PF) SYRINGE
PREFILLED_SYRINGE | INTRAVENOUS | Status: AC | PRN
  Administered 2024-06-16: 80 mg via INTRAVENOUS

## 2024-06-16 MED ORDER — NOREPINEPHRINE 16 MG/250ML-% IV SOLN
0.0000 ug/min | INTRAVENOUS | Status: DC
  Administered 2024-06-16: 20 ug/min via INTRAVENOUS
  Administered 2024-06-17: 25 ug/min via INTRAVENOUS
  Administered 2024-06-17: 15 ug/min via INTRAVENOUS
  Administered 2024-06-18: 9 ug/min via INTRAVENOUS
  Filled 2024-06-16 (×6): qty 250

## 2024-06-16 NOTE — ED Triage Notes (Signed)
 Pt BIB EMS from facilty, for respiratory distress.  Aox4 baseline. Found unresponsive, expiratory wheezing heard. Fluid issues reported. Recent GI surgery. EMS gave 2 duoneb, O2 sat not above 70 80 BP palpated. 10mg  albuterol  1mg  atrovent . Full code. 140-160 afib rvr. 100 mL epi drip  20g R AC @0n  g L forearm  RR 40 GCS 7 88% 8L  CBG 108

## 2024-06-16 NOTE — ED Notes (Signed)
 Patient incontinent to stool. Patient has a sacral wound. Area is red, with slough and dark purulent drainage. Wound appears unstageable. Old dressing removed. New mepilex dressing placed.

## 2024-06-16 NOTE — Progress Notes (Signed)
 Pt transported from ED [facility] to CT and back without any complications.

## 2024-06-16 NOTE — H&P (Signed)
 "  NAME:  Kyle Kelley, MRN:  [id number], DOB:  Dec 07, 1945, LOS: 0 ADMISSION DATE:  06/16/2024, CONSULTATION DATE:  06/16/24 REFERRING MD:  EDP, CHIEF COMPLAINT:  acute hypoxic resp failure   History of Present Illness:  79 yo male was reportedly in normal state of health at rehab facility until yesterday afternoon/evening. Pt has complex medical history with frequent hospitalizations since October 2/2 PAD, GI bleed with perforated duodenal ulcer, wound dehiscence with wound vac placement and need for rehab/snf placement at d/c  .   All history is obtained from chart review and wife at bedside as pt is intubated and sedated.   Per wife pt was participating in rehab yesterday, walking, eating overall feeling well yesterday. In evening he did not eat much and went to bed. Unclear about events of today, per her however tonight he presented to [facility] via EMS after being found in resp distress. EMS found him unresponsive with diffuse wheezing. He was recently started on diuretics 2/2 increased edema since surgery. He was also in afib with rvr. Upon arrival to ed he was unresponsive and emergently intubated for hypoxia and airway protection. He was hypotensive unresponsive to fluids and started on levo escalated up to 25mcg at this time  Ccm asked to admit for resp failure and shock. Wife requests full code for now however expressed understanding of critical state, indicated would not want long term support.  Pertinent  Medical History  Afib on chronic a/c Pad with chronic LLE non healing wound Htn Hyperlipidemia Ao stenosis H/o cva H/o prostate cancer H/o LLE arterial occlusion with critical limb ischemia 10/25, s/p thrombectomy H/o GI bleed 2/2 duodenal ulcer 12/25 H/o perforated small bowel s/p Arlyss patch repair and lengthy TPN  Significant Hospital Events: Including procedures, antibiotic start and stop dates in addition to other pertinent events   Admitted to ICU 06/16/24  Interim History /  Subjective:    Objective    Blood pressure 98/71, pulse (!) 140, temperature (!) 102.3 F (39.1 C), resp. rate 20, SpO2 100%.    Vent Mode: PRVC FiO2 (%):  [80 %-100 %] 80 % Set Rate:  [20 bmp] 20 bmp Vt Set:  [640 mL] 640 mL PEEP:  [5 cmH20] 5 cmH20 Plateau Pressure:  [28 cmH20] 28 cmH20   Intake/Output Summary (Last 24 hours) at 06/16/2024 2055 Last data filed at 06/16/2024 2047 Gross per 24 hour  Intake 4.5 ml  Output --  Net 4.5 ml   There were no vitals filed for this visit.  Examination: General: sedated unresponsive on vent HENT: ncat sclera injected, clouded iris, pupils pinpoint and not reactive bilaterally but equal, mm and conjunctiva dry and pale Lungs: rhonchi bilaterally Cardiovascular: irreg irreg Abdomen: soft, nt,nd bs diminished Extremities: + pitting edema to knee, bilateral dressings in place on lower extremities Neuro: unresponsive, no spontaneous movement, difficult to assess GU: deferred  Resolved problem list   Assessment and Plan  Acute hypoxic resp failure req mechanical ventilation Acute pneumonia, hcap, gp vs gn vs aspiration Sepsis with shock 2/2 above Bilateral pleural effusions Aki Recent LE dvt s/p thrombectomy Acute on chronic anemia Chronic hyponatremia Transaminitis Elevated bnp with known HF -titrate vent as able with dense pneumonia noted on L -nebs prn -sat/sbt as able -ctpa negative for acute pe -titrate vasopressors to map >65, will need cvc and aline -edp to place cvc prior to transfer to ICU -echo pending -liver ultrasound  -check ammonia -replace mag -follow renal indices, urine output -renal u/s  pending -hold diuretics from home at this time -hold anti htn agents from home -consider thoracentesis for effusions with fluids studies and cultures -f/u cx, urine with rare bacteria, blood and resp cx pending, rvp pending as well -cont empiric abx for now and de-escalate as able\ -trend h/h holding home eliquis  for  now, consider resuming if h&h remain stable vs heparin  gtt. Thankfully no overt bleeding noted  Labs   CBC: Recent Labs  Lab 06/16/24 1915 06/16/24 1931 06/16/24 1935 06/16/24 2020  WBC 24.3*  --   --   --   NEUTROABS 23.0*  --   --   --   HGB 9.3* 10.9* 11.2* 9.9*  HCT 32.2* 32.0* 33.0* 29.0*  MCV 82.1  --   --   --   PLT 334  --   --   --     Basic Metabolic Panel: Recent Labs  Lab 06/16/24 1915 06/16/24 1931 06/16/24 1935 06/16/24 2020  NA 133* 130* 130* 131*  K 3.9 3.8 3.8 3.5  CL 89*  --  89*  --   CO2 33*  --   --   --   GLUCOSE 93  --  96  --   BUN 44*  --  50*  --   CREATININE 1.44*  --  1.60*  --   CALCIUM  8.1*  --   --   --    GFR: Estimated Creatinine Clearance: 43.8 mL/min (A) (by C-G formula based on SCr of 1.6 mg/dL (H)). Recent Labs  Lab 06/16/24 1915 06/16/24 1936  WBC 24.3*  --   LATICACIDVEN  --  4.4*    Liver Function Tests: Recent Labs  Lab 06/16/24 1915  AST 330*  ALT 272*  ALKPHOS 70  BILITOT 0.5  PROT 7.1  ALBUMIN  2.5*   No results for input(s): LIPASE, AMYLASE in the last 168 hours. No results for input(s): AMMONIA in the last 168 hours.  ABG    Component Value Date/Time   PHART 7.390 06/16/2024 2020   PCO2ART 59.8 (H) 06/16/2024 2020   PO2ART 126 (H) 06/16/2024 2020   HCO3 35.5 (H) 06/16/2024 2020   TCO2 37 (H) 06/16/2024 2020   O2SAT 98 06/16/2024 2020     Coagulation Profile: Recent Labs  Lab 06/16/24 1915  INR 2.7*    Cardiac Enzymes: No results for input(s): CKTOTAL, CKMB, CKMBINDEX, TROPONINI in the last 168 hours.  HbA1C: Hgb A1c MFr Bld  Date/Time Value Ref Range Status  02/29/2024 04:22 AM 5.7 (H) 4.8 - 5.6 % Final    Comment:    (NOTE) Diagnosis of Diabetes The following HbA1c ranges recommended by the American Diabetes Association (ADA) may be used as an aid in the diagnosis of diabetes mellitus.  Hemoglobin             Suggested A1C NGSP%              Diagnosis  <5.7                    Non Diabetic  5.7-6.4                Pre-Diabetic  >6.4                   Diabetic  <7.0                   Glycemic control for  adults with diabetes.    09/03/2020 04:51 AM 5.9 (H) 4.8 - 5.6 % Final    Comment:    (NOTE) Pre diabetes:          5.7%-6.4%  Diabetes:              >6.4%  Glycemic control for   <7.0% adults with diabetes     CBG: No results for input(s): GLUCAP in the last 168 hours.  Review of Systems:   Unobtainable 2/2 intubated sedated status  Past Medical History:  He,  has a past medical history of Aortic stenosis, Carotid artery stenosis, Dyslipidemia, Hypertension, Inguinal hernia (2010), Peripheral vascular disease, Permanent atrial fibrillation (HCC), Pneumonia, Prostate cancer (HCC) (07/2012), PSA elevation, Sigmoid diverticulosis, Skin cancer (02/2012), Stroke (HCC), and Tobacco dependence.   Surgical History:   Past Surgical History:  Procedure Laterality Date   APPLICATION OF WOUND VAC Left 05/03/2024   Procedure: APPLICATION, LEFT LEG WOUND VAC;  Surgeon: Sheree Penne Bruckner, MD;  Location: [facility] OR;  Service: Vascular;  Laterality: Left;   APPLICATION, SKIN SUBSTITUTE Left 05/03/2024   Procedure: APPLICATION, SKIN SUBSTITUTE KERECIS 38 SQ CM;  Surgeon: Sheree Penne Bruckner, MD;  Location: [facility] OR;  Service: Vascular;  Laterality: Left;   CARDIOVERSION  08/05/2005   Direct Current Cardioversion   COLONOSCOPY  2010   Direct current Cardioversion  08/05/2005   ENDARTERECTOMY POPLITEAL  03/25/2024   Procedure: ENDARTERECTOMY, LEFT TP TRUNK WITH PATCH ANGIOPLASTY;  Surgeon: Sheree Penne Bruckner, MD;  Location: [facility] OR;  Service: Vascular;;   ESOPHAGOGASTRODUODENOSCOPY N/A 05/04/2024   Procedure: EGD (ESOPHAGOGASTRODUODENOSCOPY);  Surgeon: Elicia Claw, MD;  Location: [facility] ENDOSCOPY;  Service: Gastroenterology;  Laterality: N/A;   INCISION AND DRAINAGE OF WOUND Left 05/03/2024   Procedure: IRRIGATION AND  DEBRIDEMENT WOUND, LEFT LOWER EXTREMITY;  Surgeon: Sheree Penne Bruckner, MD;  Location: [facility] OR;  Service: Vascular;  Laterality: Left;   LAPAROTOMY N/A 05/07/2024   Procedure: LAPAROTOMY, EXPLORATORY;  Surgeon: Vernetta Berg, MD;  Location: [facility] OR;  Service: General;  Laterality: N/A;   LOWER EXTREMITY ANGIOGRAM Left 03/25/2024   Procedure: GERALYN, LOWER EXTREMITY;  Surgeon: Sheree Penne Bruckner, MD;  Location: [facility] OR;  Service: Vascular;  Laterality: Left;   PROSTATE BIOPSY  09/06/2012   Dr. Alm Fragmin   ROBOT ASSISTED LAPAROSCOPIC RADICAL PROSTATECTOMY N/A 10/31/2012   Procedure: ROBOTIC ASSISTED LAPAROSCOPIC RADICAL PROSTATECTOMY;  Surgeon: Alm GORMAN Fragmin, MD;  Location: [facility] ORS;  Service: Urology;  Laterality: N/A;   THROMBECTOMY FEMORAL ARTERY Left 03/25/2024   Procedure: THROMBECTOMY, LEFT FEMORAL TO BELOW KNEE ARTERY;  Surgeon: Sheree Penne Bruckner, MD;  Location: [facility] OR;  Service: Vascular;  Laterality: Left;   TONSILLECTOMY AND ADENOIDECTOMY     TONSILLECTOMY AND ADENOIDECTOMY     VEIN HARVEST Left 03/25/2024   Procedure: SURGICAL PROCUREMENT, LEFT LOWER SAPHENOUS VEIN;  Surgeon: Sheree Penne Bruckner, MD;  Location: [facility] OR;  Service: Vascular;  Laterality: Left;     Social History:   reports that he has quit smoking. His smoking use included cigarettes. He has a 25 pack-year smoking history. He has never used smokeless tobacco. He reports current alcohol use. He reports that he does not use drugs.   Family History:  His family history includes Cancer in his father; Heart disease in his father; Hyperlipidemia in his father; Hypertension in his mother.   Allergies Allergies[1]   Home Medications  Prior to Admission medications  Medication Sig Start Date End Date Taking? Authorizing Provider  apixaban  (ELIQUIS ) 5 MG  TABS tablet Take 5 mg by mouth 2 (two) times daily.   Yes [provider]  clopidogrel  (PLAVIX ) 75 MG tablet Take 1 tablet (75 mg total) by  mouth daily. 05/19/24   Gherghe, Costin M, MD  diltiazem  (TIADYLT  ER) 300 MG 24 hr capsule TAKE 1 CAPSULE BY MOUTH EVERY DAY FOR 90 DAYS; Duration: 90    [provider]  docusate sodium  (COLACE) 100 MG capsule Take 100 mg by mouth daily as needed for mild constipation.    [provider]  EPINEPHrine  (EPIPEN  IJ) Inject 1 application  as directed as needed (anaphylaxis).    [provider]  metolazone (ZAROXOLYN) 2.5 MG tablet Take 2.5 mg by mouth 3 (three) times a week.    [provider]  metoprolol  succinate (TOPROL -XL) 25 MG 24 hr tablet Take 1 tablet (25 mg total) by mouth daily. 12/25/23   Shlomo Wilbert SAUNDERS, MD  pantoprazole  (PROTONIX ) 40 MG tablet Take 1 tablet (40 mg total) by mouth 2 (two) times daily. 05/19/24   Trixie Nilda HERO, MD  Potassium Chloride  ER 20 MEQ TBCR Take by mouth. Patient taking differently: Take 20 mEq by mouth 4 (four) times a week.    [provider]  rosuvastatin  (CRESTOR ) 10 MG tablet Take 1 tablet (10 mg total) by mouth daily. 12/25/23   Shlomo Wilbert SAUNDERS, MD  torsemide (DEMADEX) 20 MG tablet Take 20 mg by mouth daily.    [provider]     Critical care time:              [1]  Allergies Allergen Reactions   Ace Inhibitors Anaphylaxis   Bactrim  Ds [Sulfamethoxazole -Trimethoprim ] Shortness Of Breath   Rythmol [Propafenone] Shortness Of Breath   Nitrofurantoin Rash   "

## 2024-06-16 NOTE — ED Notes (Signed)
 Xray at bedside for central line

## 2024-06-16 NOTE — ED Provider Notes (Signed)
 "  EMERGENCY DEPARTMENT AT [facility] Provider Note   CSN: [id number] Arrival date & time: 06/16/24  8087     Patient presents with: Respiratory Distress   Kyle Kelley is a 79 y.o. male who presents today for evaluation of altered mental status and respiratory distress.  Patient was discharged recently from our facility after a perforated gastric ulcer status post ex lap and patch with surgery.  Patient has since been in rehab facility.  Over the past couple days the patient has had some increasing fatigue per wife however without any acute changes.  Patient was reportedly normal this afternoon per facility however was subsequently found just prior to arrival with altered mental status as well as significant respiratory distress.  Notable hypoxia on room air with EMS as well as tachycardia, A-fib RVR into the 160s.  Patient is altered on my assessment and unable to contribute to history.  HPI     Prior to Admission medications  Medication Sig Start Date End Date Taking? Authorizing Provider  clopidogrel  (PLAVIX ) 75 MG tablet Take 1 tablet (75 mg total) by mouth daily. 05/19/24   Gherghe, Costin M, MD  docusate sodium  (COLACE) 100 MG capsule Take 100 mg by mouth daily as needed for mild constipation.    [provider]  EPINEPHrine  (EPIPEN  IJ) Inject 1 application  as directed as needed (anaphylaxis).    [provider]  metolazone (ZAROXOLYN) 2.5 MG tablet Take 2.5 mg by mouth 3 (three) times a week.    [provider]  metoprolol  succinate (TOPROL -XL) 25 MG 24 hr tablet Take 1 tablet (25 mg total) by mouth daily. 12/25/23   Shlomo Wilbert SAUNDERS, MD  pantoprazole  (PROTONIX ) 40 MG tablet Take 1 tablet (40 mg total) by mouth 2 (two) times daily. 05/19/24   Trixie Nilda HERO, MD  Potassium Chloride  ER 20 MEQ TBCR Take by mouth. Patient taking differently: Take 20 mEq by mouth 4 (four) times a week.    [provider]  rosuvastatin  (CRESTOR )  10 MG tablet Take 1 tablet (10 mg total) by mouth daily. 12/25/23   Shlomo Wilbert SAUNDERS, MD  torsemide (DEMADEX) 20 MG tablet Take 20 mg by mouth daily.    [provider]    Allergies: Ace inhibitors, Bactrim  ds [sulfamethoxazole -trimethoprim ], Rythmol [propafenone], and Nitrofurantoin    Review of Systems  Updated Vital Signs BP 93/61   Pulse (!) 156   Temp (!) 101 F (38.3 C) (Oral)   Resp (!) 37   SpO2 93%   Physical Exam Constitutional:      General: He is in acute distress.     Appearance: He is ill-appearing and toxic-appearing.  HENT:     Head: Normocephalic.     Right Ear: External ear normal.     Left Ear: External ear normal.     Nose: Nose normal.     Mouth/Throat:     Mouth: Mucous membranes are dry.  Eyes:     Pupils: Pupils are equal, round, and reactive to light.  Cardiovascular:     Rate and Rhythm: Tachycardia present. Rhythm irregular.     Pulses: Normal pulses.  Pulmonary:     Effort: Respiratory distress present.     Breath sounds: No wheezing.     Comments: Diminished lung sounds on the left Abdominal:     General: Abdomen is flat. There is no distension.     Tenderness: There is no abdominal tenderness.  Musculoskeletal:  General: Normal range of motion.     Cervical back: Normal range of motion.     Right lower leg: Edema present.     Left lower leg: Edema present.  Skin:    General: Skin is warm.     Capillary Refill: Capillary refill takes less than 2 seconds.  Neurological:     Mental Status: He is unresponsive.     GCS: GCS eye subscore is 4. GCS verbal subscore is 1. GCS motor subscore is 5.     (all labs ordered are listed, but only abnormal results are displayed) Labs Reviewed  COMPREHENSIVE METABOLIC PANEL WITH GFR - Abnormal; Notable for the following components:      Result Value   Sodium 133 (*)    Chloride 89 (*)    CO2 33 (*)    BUN 44 (*)    Creatinine, Ser 1.44 (*)    Calcium  8.1 (*)    Albumin  2.5 (*)     AST 330 (*)    ALT 272 (*)    GFR, Estimated 50 (*)    All other components within normal limits  CBC WITH DIFFERENTIAL/PLATELET - Abnormal; Notable for the following components:   WBC 24.3 (*)    RBC 3.92 (*)    Hemoglobin 9.3 (*)    HCT 32.2 (*)    MCH 23.7 (*)    MCHC 28.9 (*)    RDW 18.7 (*)    Neutro Abs 23.0 (*)    Lymphs Abs 0.3 (*)    Abs Immature Granulocytes 0.08 (*)    All other components within normal limits  PROTIME-INR - Abnormal; Notable for the following components:   Prothrombin Time 29.7 (*)    INR 2.7 (*)    All other components within normal limits  URINALYSIS, W/ REFLEX TO CULTURE (INFECTION SUSPECTED) - Abnormal; Notable for the following components:   APPearance HAZY (*)    Specific Gravity, Urine >1.030 (*)    Hgb urine dipstick SMALL (*)    Bilirubin Urine SMALL (*)    Protein, ur 30 (*)    Leukocytes,Ua TRACE (*)    Bacteria, UA RARE (*)    All other components within normal limits  PRO BRAIN NATRIURETIC PEPTIDE - Abnormal; Notable for the following components:   Pro Brain Natriuretic Peptide 8,376.0 (*)    All other components within normal limits  I-STAT VENOUS BLOOD GAS, ED - Abnormal; Notable for the following components:   pH, Ven 7.516 (*)    pCO2, Ven 43.9 (*)    pO2, Ven 137 (*)    Bicarbonate 35.5 (*)    TCO2 37 (*)    Acid-Base Excess 11.0 (*)    Sodium 130 (*)    Calcium , Ion 0.87 (*)    HCT 32.0 (*)    Hemoglobin 10.9 (*)    All other components within normal limits  I-STAT CG4 LACTIC ACID, ED - Abnormal; Notable for the following components:   Lactic Acid, Venous 4.4 (*)    All other components within normal limits  I-STAT CHEM 8, ED - Abnormal; Notable for the following components:   Sodium 130 (*)    Chloride 89 (*)    BUN 50 (*)    Creatinine, Ser 1.60 (*)    Calcium , Ion 0.87 (*)    TCO2 34 (*)    Hemoglobin 11.2 (*)    HCT 33.0 (*)    All other components within normal limits  RESP PANEL BY RT-PCR (RSV, FLU  A&B,  COVID)  RVPGX2  CULTURE, BLOOD (ROUTINE X 2)  CULTURE, BLOOD (ROUTINE X 2)  TRIGLYCERIDES    EKG: None  Radiology: DG Abd Portable 1 View Result Date: 06/16/2024 EXAM: 1 VIEW XRAY OF THE ABDOMEN 06/16/2024 07:47:22 PM COMPARISON: CT 05/06/2024. CLINICAL HISTORY: Intubation Intubation. FINDINGS: LINES, TUBES AND DEVICES: NG tube tip is in the proximal stomach with the side port in the distal esophagus. BOWEL: Nonobstructive bowel gas pattern. Large stool burden throughout the colon. SOFT TISSUES: No abnormal calcifications. BONES: No acute fracture. IMPRESSION: 1. Enteric tube tip in the proximal stomach with side port in the distal esophagus; recommend advancement. 2. Large stool burden throughout the colon. Electronically signed by: Franky Crease MD 06/16/2024 07:52 PM EST RP Workstation: [id number]   DG Chest Portable 1 View Result Date: 06/16/2024 EXAM: 1 VIEW(S) XRAY OF THE CHEST 06/16/2024 07:47:22 PM COMPARISON: 05/07/2024 CLINICAL HISTORY: Intubation Intubation Intubation FINDINGS: LINES, TUBES AND DEVICES: Endotracheal tube tip is 5 cm above the carina. NG tube enters the stomach. LUNGS AND PLEURA: Diffuse airspace disease throughout the left lung. Patchy right perihilar and lower lobe opacities. No pleural effusion. No pneumothorax. HEART AND MEDIASTINUM: Heart mildly enlarged. BONES AND SOFT TISSUES: No acute osseous abnormality. IMPRESSION: 1. Endotracheal tube tip is 5 cm above the carina. NG tube enters the stomach. 2. Diffuse airspace disease throughout the left lung and patchy right perihilar and lower lobe opacities. Electronically signed by: Franky Crease MD 06/16/2024 07:51 PM EST RP Workstation: [id number]    Date/Time: 06/16/2024 11:37 PM  Performed by: Laurita Sieving, MDPre-anesthesia Checklist: Patient identified, Patient being monitored, Emergency Drugs available, Timeout performed and Suction available Oxygen Delivery Method: Non-rebreather mask Preoxygenation: Pre-oxygenation  with 100% oxygen Induction Type: Rapid sequence Laryngoscope Size: Mac and 4 Grade View: Grade I Tube size: 8.0 mm Number of attempts: 1 Airway Equipment and Method: Video-laryngoscopy Placement Confirmation: ETT inserted through vocal cords under direct vision, CO2 detector, Breath sounds checked- equal and bilateral and Positive ETCO2 Tube secured with: ETT holder    Central Line  Performed by: Laurita Sieving, MD Authorized by: Layman Raisin, DO   Consent:    Consent obtained:  Verbal   Consent given by:  Spouse   Risks discussed:  Arterial puncture, bleeding, incorrect placement, infection, nerve damage and pneumothorax   Alternatives discussed:  No treatment Universal protocol:    Procedure explained and questions answered to patient or proxy's satisfaction: yes     Patient identity confirmed:  Arm band Pre-procedure details:    Indication(s): central venous access     Hand hygiene: Hand hygiene performed prior to insertion     Sterile barrier technique: All elements of maximal sterile technique followed     Skin preparation:  Chlorhexidine    Skin preparation agent: Skin preparation agent completely dried prior to procedure   Sedation:    Sedation type:  None Anesthesia:    Anesthesia method:  None Procedure details:    Location:  R internal jugular   Patient position:  Supine   Procedural supplies:  Triple lumen   Catheter size:  7 Fr   Landmarks identified: yes     Ultrasound guidance: yes     Ultrasound guidance timing: prior to insertion and real time     Sterile ultrasound techniques: Sterile gel and sterile probe covers were used     Number of attempts:  1   Successful placement: yes   Post-procedure details:    Post-procedure:  Dressing applied   Assessment:  Blood return through all ports, free fluid flow, no pneumothorax on x-ray and placement verified by x-ray   Procedure completion:  Tolerated well, no immediate complications    Medications Ordered  in the ED  norepinephrine  (LEVOPHED ) injection (10 mcg Intravenous Given 06/16/24 1920)  norepinephrine  (LEVOPHED ) 4mg  in (0.016 mg/mL) premix infusion (20 mcg/min Intravenous New Bag/Given 06/16/24 1923)  etomidate  (AMIDATE ) injection (20 mg Intravenous Given 06/16/24 1927)  rocuronium  (ZEMURON ) injection (80 mg Intravenous Given 06/16/24 1928)  fentaNYL  in NS (20mcg/ml) infusion-PREMIX (100 mcg/hr Intravenous New Bag/Given 06/16/24 1937)  fentaNYL  (SUBLIMAZE ) bolus via infusion 25-100 mcg (has no administration in time range)  propofol  (DIPRIVAN ) 1000 MG/100ML infusion (10 mcg/kg/min  78.9 kg Intravenous New Bag/Given 06/16/24 1938)  0.9 %  sodium chloride  infusion (has no administration in time range)  norepinephrine  (LEVOPHED ) 4mg  in (0.016 mg/mL) premix infusion (has no administration in time range)  lactated ringers  bolus 1,000 mL (has no administration in time range)  ceFEPIme  (MAXIPIME ) 2 g in sodium chloride  0.9 % 100 mL IVPB (2 g Intravenous New Bag/Given 06/16/24 1947)  vancomycin  (VANCOREADY) IVPB 1500 mg/300 mL (has no administration in time range)  azithromycin  (ZITHROMAX ) tablet 500 mg (has no administration in time range)  iohexol  (OMNIPAQUE ) 350 MG/ML injection 75 mL (has no administration in time range)                               Medical Decision Making Amount and/or Complexity of Data Reviewed Labs: ordered. Radiology: ordered.  Risk OTC drugs. Prescription drug management. Decision regarding hospitalization.   Patient is a 79 year old male who presents today for evaluation of altered mental status as well as acute respiratory distress.  On initial assessment patient was noted to be toxic-appearing and critically ill.  Obtunded on my assessment with significant respiratory distress.  Diminished lung sounds to the left.  Patient requiring nonrebreather mask to maintain oxygen saturations at this point in time.  Patient was also notably  hypotensive here in the emergency department.  Was subsequent started on Levophed  and uptitrated to 20 mcg/min.  Due to patient's current presentation, do not feel that he is appropriate for positive pressure ventilation at this point in time.  Will proceed with intubation due to hypoxic respiratory failure as well as altered mental status.  Procedure details outlined above.  Postintubation x-ray with evidence of significant opacities throughout the left lung.  Patient was also noted to be febrile.  Sepsis protocol initiated and patient provided vancomycin  as well as cefepime  for initial antibiotic therapy.  Patient's ejection fraction appears preserved on my bedside assessment and patient provided initial 1 L of IV fluids for management.  Patient is A-fib RVR felt likely secondary to critical illness and immediate rate control deferred at this point in time.  Laboratory evaluation notable for elevated leukocytosis, lactic acid of 4.4, BNP 8000.  With concerns for potential volume overload that may be contributing, fluid resuscitation subsequent discontinued.  Patient was also with persistent pressor requirements to maintain blood pressure.  Central line inserted as above.  Patient was also admitted to the medical ICU for further management and concerns for pulmonary sepsis and acute hypoxic respiratory failure.    Final diagnoses:  Acute hypoxic respiratory failure (HCC)  Atrial fibrillation with RVR (HCC)  Septic shock [facility])    ED Discharge Orders     None          Laurita,  Toribio, MD 06/16/24 2342  "

## 2024-06-16 NOTE — ED Notes (Signed)
 Dr Laurita at bedside for central line

## 2024-06-16 NOTE — Procedures (Signed)
 Arterial Catheter Insertion Procedure Note  Kyle Kelley  [id number]  [birth year]  Date:06/16/24  Time:10:35 PM    Provider Performing: Leita SAUNDERS Agusta Hackenberg    Procedure: Insertion of Arterial Line (63379) with US  guidance (23062)   Indication(s) Blood pressure monitoring and/or need for frequent ABGs  Consent Unable to obtain consent due to emergent nature of procedure.  Anesthesia None   Time Out Verified patient identification, verified procedure, site/side was marked, verified correct patient position, special equipment/implants available, medications/allergies/relevant history reviewed, required imaging and test results available.   Sterile Technique Maximal sterile technique including full sterile barrier drape, hand hygiene, sterile gown, sterile gloves, mask, hair covering, sterile ultrasound probe cover (if used).   Procedure Description Area of catheter insertion was cleaned with chlorhexidine  and draped in sterile fashion. With real-time ultrasound guidance an arterial catheter was placed into the left radial artery.  Appropriate arterial tracings confirmed on monitor.     Complications/Tolerance None; patient tolerated the procedure well.   EBL Minimal   Specimen(s) None   Leita SAUNDERS Isabel Freese, PA-C

## 2024-06-16 NOTE — Code Documentation (Signed)
 Pt currently on non rebreather in preperation for intubation

## 2024-06-16 NOTE — ED Notes (Addendum)
 Critical care at bedside

## 2024-06-16 NOTE — Progress Notes (Addendum)
 SPIRITUAL CARE AND COUNSELING CONSULT NOTE   VISIT SUMMARY Chaplain responded to RN referral to visit with pt Jimmy's wife, Devere. I met her in the 2H waiting area. She shared with me her thoughts, fears and emotions regarding Jimmy's current hospitalization. Support provided as listed below, including prayer with her consent. Devere expressed gratitude for chaplain presence and understands that we remain available as needed. Time spent: 1.5 hours.   SPIRITUAL ENCOUNTER                                                                                                                                                                      Type of Visit: Initial Care provided to:: Pt and family Conversation partners present during encounter: Nurse Referral source: Nurse (RN/NT/LPN) Reason for visit: Urgent spiritual support OnCall Visit: Yes  INTERVENTIONS   Spiritual Care Interventions Made: Established relationship of care and support, Compassionate presence, Reflective listening, Normalization of emotions, Narrative/life review, Explored values/beliefs/practices/strengths, Prayer, Encouragement   INTERVENTION OUTCOMES   Outcomes: Connection to spiritual care, Awareness around self/spiritual resourses, Connection to values and goals of care, Autonomy/agency, Awareness of support, Reduced anxiety, Reduced fear, Reduced isolation, Patient family open to resources  If immediate needs arise, please contact [name] 24 hour on call [phone]   Donnice JINNY Shuck, Chaplain  06/16/2024 11:48 PM

## 2024-06-16 NOTE — Sepsis Progress Note (Signed)
 Elink following code sepsis

## 2024-06-16 NOTE — ED Notes (Signed)
 OG tube dislodged

## 2024-06-16 NOTE — ED Notes (Signed)
 Wife is at the bedside.

## 2024-06-16 NOTE — ED Notes (Signed)
 Back from CT

## 2024-06-16 NOTE — Progress Notes (Signed)
 Pharmacy Antibiotic Note  Kyle Kelley is a 79 y.o. male for which pharmacy has been consulted for cefepime  and vancomycin  dosing for pneumonia. Patient unresponsive from SNF. Intubated in ED.  SCr 1.6 - AKI  Plan: Cefepime  2g q12hr  Vancomycin  1500 mg once, subsequent dosing as indicated per random vancomycin  level until renal function stable and/or improved, at which time scheduled dosing can be considered Monitor WBC, fever, renal function, cultures De-escalate when able F/u MRSA PCR     Temp (24hrs), Avg:101.7 F (38.7 C), Min:100.6 F (38.1 C), Max:102.3 F (39.1 C)  Recent Labs  Lab 06/16/24 1915 06/16/24 1935 06/16/24 1936 06/16/24 2056  WBC 24.3*  --   --   --   CREATININE 1.44* 1.60*  --   --   LATICACIDVEN  --   --  4.4* 4.7*    Estimated Creatinine Clearance: 43.8 mL/min (A) (by C-G formula based on SCr of 1.6 mg/dL (H)).    Allergies[1]  Microbiology results: Pending  Thank you for allowing pharmacy to be a part of this patients care.  Dorn Buttner, PharmD, BCPS 06/16/2024 9:06 PM ED Clinical Pharmacist -  [phone]      [1]  Allergies Allergen Reactions   Ace Inhibitors Anaphylaxis   Bactrim  Ds [Sulfamethoxazole -Trimethoprim ] Shortness Of Breath   Rythmol [Propafenone] Shortness Of Breath   Nitrofurantoin Rash

## 2024-06-16 NOTE — ED Notes (Signed)
 Dr

## 2024-06-17 ENCOUNTER — Inpatient Hospital Stay (HOSPITAL_COMMUNITY)

## 2024-06-17 DIAGNOSIS — I739 Peripheral vascular disease, unspecified: Secondary | ICD-10-CM | POA: Diagnosis not present

## 2024-06-17 DIAGNOSIS — I509 Heart failure, unspecified: Secondary | ICD-10-CM | POA: Diagnosis not present

## 2024-06-17 DIAGNOSIS — I10 Essential (primary) hypertension: Secondary | ICD-10-CM | POA: Diagnosis not present

## 2024-06-17 DIAGNOSIS — R578 Other shock: Secondary | ICD-10-CM

## 2024-06-17 DIAGNOSIS — E876 Hypokalemia: Secondary | ICD-10-CM | POA: Diagnosis not present

## 2024-06-17 DIAGNOSIS — N179 Acute kidney failure, unspecified: Secondary | ICD-10-CM | POA: Diagnosis not present

## 2024-06-17 DIAGNOSIS — I482 Chronic atrial fibrillation, unspecified: Secondary | ICD-10-CM | POA: Diagnosis not present

## 2024-06-17 DIAGNOSIS — R6521 Severe sepsis with septic shock: Secondary | ICD-10-CM | POA: Diagnosis not present

## 2024-06-17 DIAGNOSIS — J9601 Acute respiratory failure with hypoxia: Secondary | ICD-10-CM

## 2024-06-17 DIAGNOSIS — E872 Acidosis, unspecified: Secondary | ICD-10-CM | POA: Diagnosis not present

## 2024-06-17 DIAGNOSIS — E871 Hypo-osmolality and hyponatremia: Secondary | ICD-10-CM | POA: Diagnosis not present

## 2024-06-17 DIAGNOSIS — A419 Sepsis, unspecified organism: Secondary | ICD-10-CM | POA: Diagnosis not present

## 2024-06-17 DIAGNOSIS — E785 Hyperlipidemia, unspecified: Secondary | ICD-10-CM

## 2024-06-17 LAB — COOXEMETRY PANEL
Carboxyhemoglobin: 1.5 % (ref 0.5–1.5)
Methemoglobin: 1.3 % (ref 0.0–1.5)
O2 Saturation: 64.1 %
Total hemoglobin: 9.5 g/dL — ABNORMAL LOW (ref 12.0–16.0)

## 2024-06-17 LAB — POCT I-STAT 7, (LYTES, BLD GAS, ICA,H+H)
Acid-Base Excess: 8 mmol/L — ABNORMAL HIGH (ref 0.0–2.0)
Acid-Base Excess: 9 mmol/L — ABNORMAL HIGH (ref 0.0–2.0)
Acid-Base Excess: 12 mmol/L — ABNORMAL HIGH (ref 0.0–2.0)
Acid-Base Excess: 10 mmol/L — ABNORMAL HIGH (ref 0.0–2.0)
Bicarbonate: 32.3 mmol/L — ABNORMAL HIGH (ref 20.0–28.0)
Bicarbonate: 35.4 mmol/L — ABNORMAL HIGH (ref 20.0–28.0)
Bicarbonate: 34.9 mmol/L — ABNORMAL HIGH (ref 20.0–28.0)
Bicarbonate: 36.5 mmol/L — ABNORMAL HIGH (ref 20.0–28.0)
Calcium, Ion: 1.02 mmol/L — ABNORMAL LOW (ref 1.15–1.40)
Calcium, Ion: 1.03 mmol/L — ABNORMAL LOW (ref 1.15–1.40)
Calcium, Ion: 1 mmol/L — ABNORMAL LOW (ref 1.15–1.40)
Calcium, Ion: 1.07 mmol/L — ABNORMAL LOW (ref 1.15–1.40)
HCT: 33 % — ABNORMAL LOW (ref 39.0–52.0)
HCT: 31 % — ABNORMAL LOW (ref 39.0–52.0)
HCT: 32 % — ABNORMAL LOW (ref 39.0–52.0)
HCT: 32 % — ABNORMAL LOW (ref 39.0–52.0)
Hemoglobin: 11.2 g/dL — ABNORMAL LOW (ref 13.0–17.0)
Hemoglobin: 10.5 g/dL — ABNORMAL LOW (ref 13.0–17.0)
Hemoglobin: 10.9 g/dL — ABNORMAL LOW (ref 13.0–17.0)
Hemoglobin: 10.9 g/dL — ABNORMAL LOW (ref 13.0–17.0)
O2 Saturation: 100 %
O2 Saturation: 98 %
O2 Saturation: 99 %
O2 Saturation: 97 %
Patient temperature: 38
Patient temperature: 37.1
Patient temperature: 37.5
Patient temperature: 36.8
Potassium: 3.2 mmol/L — ABNORMAL LOW (ref 3.5–5.1)
Potassium: 3.3 mmol/L — ABNORMAL LOW (ref 3.5–5.1)
Potassium: 3 mmol/L — ABNORMAL LOW (ref 3.5–5.1)
Potassium: 4 mmol/L (ref 3.5–5.1)
Sodium: 130 mmol/L — ABNORMAL LOW (ref 135–145)
Sodium: 130 mmol/L — ABNORMAL LOW (ref 135–145)
Sodium: 132 mmol/L — ABNORMAL LOW (ref 135–145)
Sodium: 130 mmol/L — ABNORMAL LOW (ref 135–145)
TCO2: 34 mmol/L — ABNORMAL HIGH (ref 22–32)
TCO2: 37 mmol/L — ABNORMAL HIGH (ref 22–32)
TCO2: 36 mmol/L — ABNORMAL HIGH (ref 22–32)
TCO2: 38 mmol/L — ABNORMAL HIGH (ref 22–32)
pCO2 arterial: 45 mmHg (ref 32–48)
pCO2 arterial: 55.6 mmHg — ABNORMAL HIGH (ref 32–48)
pCO2 arterial: 36.6 mmHg (ref 32–48)
pCO2 arterial: 56.6 mmHg — ABNORMAL HIGH (ref 32–48)
pH, Arterial: 7.468 — ABNORMAL HIGH (ref 7.35–7.45)
pH, Arterial: 7.412 (ref 7.35–7.45)
pH, Arterial: 7.588 — ABNORMAL HIGH (ref 7.35–7.45)
pH, Arterial: 7.417 (ref 7.35–7.45)
pO2, Arterial: 179 mmHg — ABNORMAL HIGH (ref 83–108)
pO2, Arterial: 100 mmHg (ref 83–108)
pO2, Arterial: 130 mmHg — ABNORMAL HIGH (ref 83–108)
pO2, Arterial: 89 mmHg (ref 83–108)

## 2024-06-17 LAB — ECHOCARDIOGRAM COMPLETE
AR max vel: 1.5 cm2
AV Area VTI: 1.87 cm2
AV Area mean vel: 1.48 cm2
AV Mean grad: 5.3 mmHg
AV Peak grad: 10.3 mmHg
Ao pk vel: 1.61 m/s
Area-P 1/2: 5.38 cm2
Height: 73 in
S' Lateral: 3.3 cm
Weight: 2606.72 [oz_av]

## 2024-06-17 LAB — RESPIRATORY PANEL BY PCR

## 2024-06-17 LAB — BASIC METABOLIC PANEL WITH GFR
Anion gap: 12 (ref 5–15)
Anion gap: 8 (ref 5–15)
BUN: 48 mg/dL — ABNORMAL HIGH (ref 8–23)
BUN: 51 mg/dL — ABNORMAL HIGH (ref 8–23)
CO2: 31 mmol/L (ref 22–32)
CO2: 33 mmol/L — ABNORMAL HIGH (ref 22–32)
Calcium: 8.2 mg/dL — ABNORMAL LOW (ref 8.9–10.3)
Calcium: 8 mg/dL — ABNORMAL LOW (ref 8.9–10.3)
Chloride: 87 mmol/L — ABNORMAL LOW (ref 98–111)
Chloride: 89 mmol/L — ABNORMAL LOW (ref 98–111)
Creatinine, Ser: 1.22 mg/dL (ref 0.61–1.24)
Creatinine, Ser: 1.06 mg/dL (ref 0.61–1.24)
GFR, Estimated: >60 mL/min
GFR, Estimated: >60 mL/min
Glucose, Bld: 164 mg/dL — ABNORMAL HIGH (ref 70–99)
Glucose, Bld: 152 mg/dL — ABNORMAL HIGH (ref 70–99)
Potassium: 3.2 mmol/L — ABNORMAL LOW (ref 3.5–5.1)
Potassium: 4.5 mmol/L (ref 3.5–5.1)
Sodium: 130 mmol/L — ABNORMAL LOW (ref 135–145)
Sodium: 130 mmol/L — ABNORMAL LOW (ref 135–145)

## 2024-06-17 LAB — GLUCOSE, CAPILLARY
Glucose-Capillary: 145 mg/dL — ABNORMAL HIGH (ref 70–99)
Glucose-Capillary: 147 mg/dL — ABNORMAL HIGH (ref 70–99)
Glucose-Capillary: 148 mg/dL — ABNORMAL HIGH (ref 70–99)
Glucose-Capillary: 155 mg/dL — ABNORMAL HIGH (ref 70–99)
Glucose-Capillary: 158 mg/dL — ABNORMAL HIGH (ref 70–99)
Glucose-Capillary: 162 mg/dL — ABNORMAL HIGH (ref 70–99)
Glucose-Capillary: 164 mg/dL — ABNORMAL HIGH (ref 70–99)

## 2024-06-17 LAB — CBC
HCT: 31.3 % — ABNORMAL LOW (ref 39.0–52.0)
Hemoglobin: 9.5 g/dL — ABNORMAL LOW (ref 13.0–17.0)
MCH: 23.3 pg — ABNORMAL LOW (ref 26.0–34.0)
MCHC: 30.4 g/dL (ref 30.0–36.0)
MCV: 76.9 fL — ABNORMAL LOW (ref 80.0–100.0)
Platelets: 355 K/uL (ref 150–400)
RBC: 4.07 MIL/uL — ABNORMAL LOW (ref 4.22–5.81)
RDW: 19 % — ABNORMAL HIGH (ref 11.5–15.5)
WBC: 28.1 K/uL — ABNORMAL HIGH (ref 4.0–10.5)
nRBC: 0.1 % (ref 0.0–0.2)

## 2024-06-17 LAB — LACTIC ACID, PLASMA
Lactic Acid, Venous: 3.4 mmol/L (ref 0.5–1.9)
Lactic Acid, Venous: 2 mmol/L (ref 0.5–1.9)
Lactic Acid, Venous: 2 mmol/L (ref 0.5–1.9)
Lactic Acid, Venous: 1.7 mmol/L (ref 0.5–1.9)

## 2024-06-17 LAB — TRIGLYCERIDES: Triglycerides: 71 mg/dL

## 2024-06-17 LAB — HEPATIC FUNCTION PANEL
ALT: 481 U/L — ABNORMAL HIGH (ref 0–44)
AST: 590 U/L — ABNORMAL HIGH (ref 15–41)
Albumin: 2.4 g/dL — ABNORMAL LOW (ref 3.5–5.0)
Alkaline Phosphatase: 72 U/L (ref 38–126)
Bilirubin, Direct: 0.4 mg/dL — ABNORMAL HIGH (ref 0.0–0.2)
Indirect Bilirubin: 0.2 mg/dL — ABNORMAL LOW (ref 0.3–0.9)
Total Bilirubin: 0.6 mg/dL (ref 0.0–1.2)
Total Protein: 6.6 g/dL (ref 6.5–8.1)

## 2024-06-17 LAB — TROPONIN T, HIGH SENSITIVITY
Troponin T High Sensitivity: 64 ng/L — ABNORMAL HIGH (ref 0–19)
Troponin T High Sensitivity: 110 ng/L (ref 0–19)
Troponin T High Sensitivity: 95 ng/L — ABNORMAL HIGH (ref 0–19)
Troponin T High Sensitivity: 94 ng/L — ABNORMAL HIGH (ref 0–19)

## 2024-06-17 LAB — CG4 I-STAT (LACTIC ACID): Lactic Acid, Venous: 3.3 mmol/L (ref 0.5–1.9)

## 2024-06-17 LAB — TSH: TSH: 0.508 u[IU]/mL (ref 0.350–4.500)

## 2024-06-17 LAB — MRSA NEXT GEN BY PCR, NASAL: MRSA by PCR Next Gen: NOT DETECTED

## 2024-06-17 LAB — APTT: aPTT: 56 s — ABNORMAL HIGH (ref 24–36)

## 2024-06-17 LAB — PRO BRAIN NATRIURETIC PEPTIDE: Pro Brain Natriuretic Peptide: 5997 pg/mL — ABNORMAL HIGH

## 2024-06-17 LAB — MAGNESIUM: Magnesium: 2.5 mg/dL — ABNORMAL HIGH (ref 1.7–2.4)

## 2024-06-17 MED ORDER — AMIODARONE LOAD VIA INFUSION
150.0000 mg | Freq: Once | INTRAVENOUS | Status: DC
  Filled 2024-06-17: qty 83.34

## 2024-06-17 MED ORDER — BISACODYL 10 MG RE SUPP
10.0000 mg | Freq: Once | RECTAL | Status: AC
  Administered 2024-06-17: 10 mg via RECTAL
  Filled 2024-06-17: qty 1

## 2024-06-17 MED ORDER — VITAL HP 1.0 CAL PO LIQD
1000.0000 mL | ORAL | Status: DC
  Administered 2024-06-17: 1000 mL

## 2024-06-17 MED ORDER — HEPARIN (PORCINE) 25000 UT/250ML-% IV SOLN
1500.0000 [IU]/h | INTRAVENOUS | Status: DC
  Administered 2024-06-17: 1100 [IU]/h via INTRAVENOUS
  Administered 2024-06-18: 1200 [IU]/h via INTRAVENOUS
  Administered 2024-06-19: 1300 [IU]/h via INTRAVENOUS
  Administered 2024-06-20: 1400 [IU]/h via INTRAVENOUS
  Administered 2024-06-20: 1300 [IU]/h via INTRAVENOUS
  Administered 2024-06-21: 1400 [IU]/h via INTRAVENOUS
  Administered 2024-06-22 – 2024-06-24 (×4): 1500 [IU]/h via INTRAVENOUS
  Filled 2024-06-17 (×10): qty 250

## 2024-06-17 MED ORDER — SENNOSIDES-DOCUSATE SODIUM 8.6-50 MG PO TABS: 2.0000 | ORAL_TABLET | Freq: Every day | ORAL | Status: DC

## 2024-06-17 MED ORDER — DOCUSATE SODIUM 50 MG/5ML PO LIQD
50.0000 mg | Freq: Two times a day (BID) | ORAL | Status: DC
  Administered 2024-06-17: 50 mg
  Filled 2024-06-17: qty 10

## 2024-06-17 MED ORDER — PERFLUTREN LIPID MICROSPHERE
1.0000 mL | INTRAVENOUS | Status: AC | PRN
  Administered 2024-06-17: 3 mL via INTRAVENOUS

## 2024-06-17 MED ORDER — HYDROCORTISONE SOD SUC (PF) 100 MG IJ SOLR
100.0000 mg | Freq: Two times a day (BID) | INTRAMUSCULAR | Status: DC
  Administered 2024-06-17 (×2): 100 mg via INTRAVENOUS
  Filled 2024-06-17 (×2): qty 2

## 2024-06-17 MED ORDER — FAMOTIDINE 20 MG PO TABS
20.0000 mg | ORAL_TABLET | Freq: Every day | ORAL | Status: DC
  Administered 2024-06-17 – 2024-06-18 (×2): 20 mg
  Filled 2024-06-17 (×2): qty 1

## 2024-06-17 MED ORDER — AMIODARONE HCL IN DEXTROSE 360-4.14 MG/200ML-% IV SOLN: 60.0000 mg/h | INTRAVENOUS | Status: DC

## 2024-06-17 MED ORDER — DEXTROSE 5 % IV SOLN
60.0000 mg/h | INTRAVENOUS | Status: AC
  Administered 2024-06-17 (×2): 60 mg/h via INTRAVENOUS
  Filled 2024-06-17 (×2): qty 450

## 2024-06-17 MED ORDER — METRONIDAZOLE 500 MG PO TABS
500.0000 mg | ORAL_TABLET | Freq: Two times a day (BID) | ORAL | Status: DC
  Administered 2024-06-17 (×2): 500 mg
  Filled 2024-06-17 (×3): qty 1

## 2024-06-17 MED ORDER — DEXTROSE 5 % IV SOLN
30.0000 mg/h | INTRAVENOUS | Status: DC
  Filled 2024-06-17: qty 9

## 2024-06-17 MED ORDER — AMIODARONE LOAD VIA INFUSION
150.0000 mg | Freq: Once | INTRAVENOUS | Status: AC
  Administered 2024-06-17: 150 mg via INTRAVENOUS
  Filled 2024-06-17: qty 83.34

## 2024-06-17 MED ORDER — DEXTROSE 5 % IV SOLN
30.0000 mg/h | INTRAVENOUS | Status: DC
  Administered 2024-06-18: 30 mg/h via INTRAVENOUS
  Filled 2024-06-17: qty 9
  Filled 2024-06-17: qty 450

## 2024-06-17 MED ORDER — POTASSIUM CHLORIDE 10 MEQ/100ML IV SOLN
10.0000 meq | INTRAVENOUS | Status: AC
  Administered 2024-06-17 (×4): 10 meq via INTRAVENOUS
  Filled 2024-06-17 (×4): qty 100

## 2024-06-17 MED ORDER — AMIODARONE IV BOLUS ONLY 150 MG/100ML: 150.0000 mg | Freq: Once | INTRAVENOUS | Status: DC

## 2024-06-17 MED ORDER — POLYETHYLENE GLYCOL 3350 17 G PO PACK
17.0000 g | PACK | Freq: Two times a day (BID) | ORAL | Status: DC
  Administered 2024-06-17 – 2024-06-20 (×6): 17 g
  Filled 2024-06-17 (×6): qty 1

## 2024-06-17 MED ORDER — COLLAGENASE 250 UNIT/GM EX OINT
TOPICAL_OINTMENT | Freq: Every day | CUTANEOUS | Status: DC
  Administered 2024-06-24 – 2024-06-28 (×2): 1 via TOPICAL
  Filled 2024-06-17 (×2): qty 30

## 2024-06-17 MED ORDER — CLOPIDOGREL BISULFATE 75 MG PO TABS
75.0000 mg | ORAL_TABLET | Freq: Every day | ORAL | Status: DC
  Administered 2024-06-17 – 2024-06-20 (×4): 75 mg
  Filled 2024-06-17 (×4): qty 1

## 2024-06-17 MED ORDER — LACTATED RINGERS IV BOLUS
500.0000 mL | Freq: Once | INTRAVENOUS | Status: AC
  Administered 2024-06-17: 500 mL via INTRAVENOUS

## 2024-06-17 MED ORDER — SENNOSIDES-DOCUSATE SODIUM 8.6-50 MG PO TABS
2.0000 | ORAL_TABLET | Freq: Every day | ORAL | Status: DC
  Administered 2024-06-17 – 2024-06-18 (×2): 2
  Filled 2024-06-17 (×2): qty 2

## 2024-06-17 MED ORDER — POTASSIUM CHLORIDE 20 MEQ PO PACK
40.0000 meq | PACK | Freq: Two times a day (BID) | ORAL | Status: DC
  Administered 2024-06-17 (×2): 40 meq
  Filled 2024-06-17 (×2): qty 2

## 2024-06-17 NOTE — Progress Notes (Addendum)
 "  NAME:  Kyle Kelley, MRN:  [id number], DOB:  12/01/1945, LOS: 1 ADMISSION DATE:  06/16/2024, CONSULTATION DATE:  06/16/24 REFERRING MD:  EDP, CHIEF COMPLAINT:  acute hypoxic resp failure   History of Present Illness:  79 yo male was reportedly in normal state of health at rehab facility until yesterday afternoon/evening. Pt has complex medical history with frequent hospitalizations since October 2/2 PAD, GI bleed with perforated duodenal ulcer, wound dehiscence with wound vac placement and need for rehab/snf placement at d/c  .   All history is obtained from chart review and wife at bedside as pt is intubated and sedated.   Per wife pt was participating in rehab yesterday, walking, eating overall feeling well yesterday. In evening he did not eat much and went to bed. Unclear about events of today, per her however tonight he presented to [facility] via EMS after being found in resp distress. EMS found him unresponsive with diffuse wheezing. He was recently started on diuretics 2/2 increased edema since surgery. He was also in afib with rvr. Upon arrival to ed he was unresponsive and emergently intubated for hypoxia and airway protection. He was hypotensive unresponsive to fluids and started on levo escalated up to 25mcg at this time  Ccm asked to admit for resp failure and shock. Wife requests full code for now however expressed understanding of critical state, indicated would not want long term support.  Pertinent  Medical History  Afib on chronic a/c Pad with chronic LLE non healing wound Htn Hyperlipidemia Ao stenosis H/o cva H/o prostate cancer H/o LLE arterial occlusion with critical limb ischemia 10/25, s/p thrombectomy H/o GI bleed 2/2 duodenal ulcer 12/25 H/o perforated small bowel s/p Arlyss patch repair and lengthy TPN  Significant Hospital Events: Including procedures, antibiotic start and stop dates in addition to other pertinent events   Admitted to ICU 06/16/24 1/19 Intubated,  sedated, Afib RVR, on pressors   Interim History / Subjective:    Objective    Blood pressure 110/67, pulse (!) 142, temperature 99.1 F (37.3 C), resp. rate 16, height 6' 1 (1.854 m), weight 73.9 kg, SpO2 97%. CVP:  [8 mmHg-15 mmHg] 10 mmHg  Vent Mode: PRVC FiO2 (%):  [50 %-100 %] 50 % Set Rate:  [16 bmp-20 bmp] 16 bmp Vt Set:  [500 mL-640 mL] 500 mL PEEP:  [5 cmH20-10 cmH20] 10 cmH20 Plateau Pressure:  [21 cmH20-28 cmH20] 21 cmH20   Intake/Output Summary (Last 24 hours) at 06/17/2024 0920 Last data filed at 06/17/2024 0920 Gross per 24 hour  Intake 1752.63 ml  Output 825 ml  Net 927.63 ml   Filed Weights   06/16/24 2200  Weight: 73.9 kg    Examination: General: acute on chronic older adult male, lying in icu bed on vent  HEENT: Normocephalic, PERRLA intact, ETT, OG, Pink MM CV: s1,s2, RRR, no MRG, No JVD  pulm: clear, diminished, no distress on vent  Abs: bs active, soft, midline incision- scar  Extremities: no edema, no deformity, moves all extremities on command  Skin: no rash  Neuro: Rass 0, responds to painful stimuli, cough gag reflex present  GU: foley intact- making urine   Resolved problem list   Assessment and Plan  Acute hypoxic resp failure req mechanical ventilation Acute pneumonia HCAP vs aspiration but also with component of CHF  MRSA negative -CTA negative for PE P:  Continue ventilator support and lung protective strategies  Continue LTVV  Wean PEEP and Fio2 requirements to sat goal of >  92%  HOB > 30 degrees Plat < 30  Aim for Driving pressures < 15  Intermittent Chest X-ray and ABGS Continue follow cultures-blood and tracheal aspirate VAP and PAD protocols in place  Wean sedation as tolerated, SBT and WUA daily- decrease propofol  as tolerated as well as fentanyl  gtt  DC vanc, azithromycin  and vanc- change to cefepime  and flagyl  for now   Mixed Shock- Septic with Cardiogenic Shock  Lactic Acidosis Elevated BNP- >8,000 Echo obtained-  30-35%, LV moderately decreased, global hypokinesis, RV systolic function normal- RV/LV mildly dilated  MV degenerative, Trivial MV regurg, severe mitral annular calcification, moderated calcification of aortic valve, no stenosis  - Last ECHO showing 50-55%  P: Continue cardiac tele  Continue levophed  and vaso  Currently also in Afib RVR- amio load with gtt  -will need to start heparin  infusion as well  Obtain- Coox, BNP, and Trops, obtain TSH  Obtain 12 Lead EKG  Will more than likely need cardiology consult, for heart failure  Repeat lactic trend Place on stress dose steroids along with increasing vaso to 0.04 for now   Afib RVR -hx of chronic afib on eliquis    Recent LE DVT s/p Thrombectomy  Acute Ischemic Leg in 02/2024- s/p left tibioperoneal trunk endarterectomy with vein patch angioplasty, and stenting of left SFA- on plavix   ICH in 2025  PAD P: Place on Amio, load and infusion  Obtain TSH  Monitor electrolytes  Cardiac tele   AKI-improving based last BMP  Chronic Hyponatremia  Hypokalemia  P: Continue to trend renal function daily  Continue to monitor and optimize electrolytes daily Continue to monitor urine output Continue strict I/Os Continue Adequate renal perfusion  Avoid nephrotoxic agents  Consider nephro consult  Renal US  order- f/u with  Replace K   Acute on Chronic Anemia Hx of GI bleed- 05/04/24-EGD showing duodenal ulcer Hx of perforated duodenal ulcer-taken to OR on 12/9 s/p ex-lap, graham patch repeair  - no evidence of overt bleeding Hgb 9.5 on 1/19  P:  Continue to trend CBC daily Transfuse for hgb < 7 Monitor for signs of bleeding   HTN HLD  P: Hold antihypertensives for now  Hold statin   Elevated LFTs- suspect related to shock state Ammonia-39  P: Continue to trend daily  Continue to avoid hepatotoxic agents  Liver US - ordered f/u with     Labs   CBC: Recent Labs  Lab 06/16/24 1915 06/16/24 1931 06/16/24 1935  06/16/24 2020 06/16/24 2209 06/17/24 0025 06/17/24 0456  WBC 24.3*  --   --   --  25.1*  --  28.1*  NEUTROABS 23.0*  --   --   --   --   --   --   HGB 9.3*   < > 11.2* 9.9* 8.9* 11.2* 9.5*  HCT 32.2*   < > 33.0* 29.0* 29.9* 33.0* 31.3*  MCV 82.1  --   --   --  79.9*  --  76.9*  PLT 334  --   --   --  341  --  355   < > = values in this interval not displayed.    Basic Metabolic Panel: Recent Labs  Lab 06/16/24 1915 06/16/24 1931 06/16/24 1935 06/16/24 2020 06/16/24 2207 06/17/24 0025 06/17/24 0456  NA 133*   < > 130* 131* 130* 130* 130*  K 3.9   < > 3.8 3.5 3.6 3.2* 3.2*  CL 89*  --  89*  --  86*  --  87*  CO2 33*  --   --   --  33*  --  31  GLUCOSE 93  --  96  --  148*  --  164*  BUN 44*  --  50*  --  48*  --  48*  CREATININE 1.44*  --  1.60*  --  1.39*  --  1.22  CALCIUM  8.1*  --   --   --  7.9*  --  8.2*  MG  --   --   --   --   --   --  2.5*   < > = values in this interval not displayed.   GFR: Estimated Creatinine Clearance: 53.8 mL/min (by C-G formula based on SCr of 1.22 mg/dL). Recent Labs  Lab 06/16/24 1915 06/16/24 1936 06/16/24 2056 06/16/24 2209 06/17/24 0025 06/17/24 0046 06/17/24 0456  WBC 24.3*  --   --  25.1*  --   --  28.1*  LATICACIDVEN  --  4.4* 4.7*  --  3.3* 3.4*  --     Liver Function Tests: Recent Labs  Lab 06/16/24 1915  AST 330*  ALT 272*  ALKPHOS 70  BILITOT 0.5  PROT 7.1  ALBUMIN  2.5*   No results for input(s): LIPASE, AMYLASE in the last 168 hours. Recent Labs  Lab 06/16/24 2209  AMMONIA 39*    ABG    Component Value Date/Time   PHART 7.468 (H) 06/17/2024 0025   PCO2ART 45.0 06/17/2024 0025   PO2ART 179 (H) 06/17/2024 0025   HCO3 32.3 (H) 06/17/2024 0025   TCO2 34 (H) 06/17/2024 0025   O2SAT 100 06/17/2024 0025     Coagulation Profile: Recent Labs  Lab 06/16/24 1915  INR 2.7*    Cardiac Enzymes: No results for input(s): CKTOTAL, CKMB, CKMBINDEX, TROPONINI in the last 168  hours.  HbA1C: Hgb A1c MFr Bld  Date/Time Value Ref Range Status  02/29/2024 04:22 AM 5.7 (H) 4.8 - 5.6 % Final    Comment:    (NOTE) Diagnosis of Diabetes The following HbA1c ranges recommended by the American Diabetes Association (ADA) may be used as an aid in the diagnosis of diabetes mellitus.  Hemoglobin             Suggested A1C NGSP%              Diagnosis  <5.7                   Non Diabetic  5.7-6.4                Pre-Diabetic  >6.4                   Diabetic  <7.0                   Glycemic control for                       adults with diabetes.    09/03/2020 04:51 AM 5.9 (H) 4.8 - 5.6 % Final    Comment:    (NOTE) Pre diabetes:          5.7%-6.4%  Diabetes:              >6.4%  Glycemic control for   <7.0% adults with diabetes     CBG: Recent Labs  Lab 06/16/24 2149 06/17/24 0037 06/17/24 0356 06/17/24 0744  GLUCAP 148* 145* 158* 155*    Review of Systems:  Unobtainable 2/2 intubated sedated status  Past Medical History:  He,  has a past medical history of Aortic stenosis, Carotid artery stenosis, Dyslipidemia, Hypertension, Inguinal hernia (2010), Peripheral vascular disease, Permanent atrial fibrillation (HCC), Pneumonia, Prostate cancer (HCC) (07/2012), PSA elevation, Sigmoid diverticulosis, Skin cancer (02/2012), Stroke (HCC), and Tobacco dependence.   Surgical History:   Past Surgical History:  Procedure Laterality Date   APPLICATION OF WOUND VAC Left 05/03/2024   Procedure: APPLICATION, LEFT LEG WOUND VAC;  Surgeon: Sheree Penne Bruckner, MD;  Location: [facility] OR;  Service: Vascular;  Laterality: Left;   APPLICATION, SKIN SUBSTITUTE Left 05/03/2024   Procedure: APPLICATION, SKIN SUBSTITUTE KERECIS 38 SQ CM;  Surgeon: Sheree Penne Bruckner, MD;  Location: [facility] OR;  Service: Vascular;  Laterality: Left;   CARDIOVERSION  08/05/2005   Direct Current Cardioversion   COLONOSCOPY  2010   Direct current Cardioversion  08/05/2005    ENDARTERECTOMY POPLITEAL  03/25/2024   Procedure: ENDARTERECTOMY, LEFT TP TRUNK WITH PATCH ANGIOPLASTY;  Surgeon: Sheree Penne Bruckner, MD;  Location: [facility] OR;  Service: Vascular;;   ESOPHAGOGASTRODUODENOSCOPY N/A 05/04/2024   Procedure: EGD (ESOPHAGOGASTRODUODENOSCOPY);  Surgeon: Elicia Claw, MD;  Location: [facility] ENDOSCOPY;  Service: Gastroenterology;  Laterality: N/A;   INCISION AND DRAINAGE OF WOUND Left 05/03/2024   Procedure: IRRIGATION AND DEBRIDEMENT WOUND, LEFT LOWER EXTREMITY;  Surgeon: Sheree Penne Bruckner, MD;  Location: [facility] OR;  Service: Vascular;  Laterality: Left;   LAPAROTOMY N/A 05/07/2024   Procedure: LAPAROTOMY, EXPLORATORY;  Surgeon: Vernetta Berg, MD;  Location: [facility] OR;  Service: General;  Laterality: N/A;   LOWER EXTREMITY ANGIOGRAM Left 03/25/2024   Procedure: GERALYN, LOWER EXTREMITY;  Surgeon: Sheree Penne Bruckner, MD;  Location: [facility] OR;  Service: Vascular;  Laterality: Left;   PROSTATE BIOPSY  09/06/2012   Dr. Alm Fragmin   ROBOT ASSISTED LAPAROSCOPIC RADICAL PROSTATECTOMY N/A 10/31/2012   Procedure: ROBOTIC ASSISTED LAPAROSCOPIC RADICAL PROSTATECTOMY;  Surgeon: Alm GORMAN Fragmin, MD;  Location: [facility] ORS;  Service: Urology;  Laterality: N/A;   THROMBECTOMY FEMORAL ARTERY Left 03/25/2024   Procedure: THROMBECTOMY, LEFT FEMORAL TO BELOW KNEE ARTERY;  Surgeon: Sheree Penne Bruckner, MD;  Location: [facility] OR;  Service: Vascular;  Laterality: Left;   TONSILLECTOMY AND ADENOIDECTOMY     TONSILLECTOMY AND ADENOIDECTOMY     VEIN HARVEST Left 03/25/2024   Procedure: SURGICAL PROCUREMENT, LEFT LOWER SAPHENOUS VEIN;  Surgeon: Sheree Penne Bruckner, MD;  Location: [facility] OR;  Service: Vascular;  Laterality: Left;     Social History:   reports that he has quit smoking. His smoking use included cigarettes. He has a 25 pack-year smoking history. He has never used smokeless tobacco. He reports current alcohol use. He reports that he does not use drugs.   Family History:   His family history includes Cancer in his father; Heart disease in his father; Hyperlipidemia in his father; Hypertension in his mother.   Allergies Allergies[1]   Home Medications  Prior to Admission medications  Medication Sig Start Date End Date Taking? Authorizing Provider  apixaban  (ELIQUIS ) 5 MG TABS tablet Take 5 mg by mouth 2 (two) times daily.   Yes [provider]  clopidogrel  (PLAVIX ) 75 MG tablet Take 1 tablet (75 mg total) by mouth daily. 05/19/24   Gherghe, Costin M, MD  diltiazem  (TIADYLT  ER) 300 MG 24 hr capsule TAKE 1 CAPSULE BY MOUTH EVERY DAY FOR 90 DAYS; Duration: 90    [provider]  docusate sodium  (COLACE) 100 MG capsule Take 100 mg by mouth daily as needed for  mild constipation.    [provider]  EPINEPHrine  (EPIPEN  IJ) Inject 1 application  as directed as needed (anaphylaxis).    [provider]  metolazone (ZAROXOLYN) 2.5 MG tablet Take 2.5 mg by mouth 3 (three) times a week.    [provider]  metoprolol  succinate (TOPROL -XL) 25 MG 24 hr tablet Take 1 tablet (25 mg total) by mouth daily. 12/25/23   Shlomo Wilbert SAUNDERS, MD  pantoprazole  (PROTONIX ) 40 MG tablet Take 1 tablet (40 mg total) by mouth 2 (two) times daily. 05/19/24   Trixie Nilda HERO, MD  Potassium Chloride  ER 20 MEQ TBCR Take by mouth. Patient taking differently: Take 20 mEq by mouth 4 (four) times a week.    [provider]  rosuvastatin  (CRESTOR ) 10 MG tablet Take 1 tablet (10 mg total) by mouth daily. 12/25/23   Shlomo Wilbert SAUNDERS, MD  torsemide (DEMADEX) 20 MG tablet Take 20 mg by mouth daily.    [provider]     Critical care time: 60 mins     Christian Lesly Pontarelli AGACNP-BC    [facility] 06/17/2024, 10:05 AM  Please see [email or web address] for pager details.           [1]  Allergies Allergen Reactions   Ace Inhibitors Anaphylaxis   Bactrim  Ds [Sulfamethoxazole -Trimethoprim ] Shortness Of Breath   Rythmol  [Propafenone] Shortness Of Breath   Nitrofurantoin Rash   "

## 2024-06-17 NOTE — Consult Note (Signed)
 WOC Nurse Consult Note: patient is known to WOC team from previous admit post vascular procedure with NPWT L leg Reason for Consult: sacral wound Wound type: 1.  Unstageable Pressure Injury sacrococcygeal dark soft eschar (appears to have been a deep tissue pressure injury that has evolved)  2.  Full thickness L lower leg postop as above red moist  Pressure Injury POA: Yes Measurement: see nursing flowsheet  Wound bed: as above  Drainage (amount, consistency, odor) see nursing flowsheet  Periwound: erythema surrounding sacral wound  Dressing procedure/placement/frequency: 1.  Cleanse sacral wound with Vashe, do not rinse.  Apply 1/4 thick layer of Santyl  to wound bed daily, top with saline moist gauze, dry gauze and secure with silicone foam or ABD pad and clothe tape whichever is preferred.  2. Cleanse L leg wound with Vashe, do not rinse. Apply Vashe moistened gauze to wound bed daily, cover with dry gauze and ABD pad.  Secure with Kerlix roll gauze or silicone foam whichever works best.   POC discussed with nurse, mental health.  Appreciate EMERSON Greener, RN assistance with this consult.   Thank you,    Powell Bar MSN, RN-BC, [employer]

## 2024-06-17 NOTE — Progress Notes (Signed)
 Vent changes made by CCM post ABG results.

## 2024-06-17 NOTE — Progress Notes (Signed)
 ANTICOAGULATION CONSULT NOTE  Pharmacy Consult for heparin  Indication: Afib (Eliquis  PTA), CLI s/p thrombectomy/endarterectomy 02/2024 *hx ICH (cleared by [facility] for anticoagulation 02/2024), GIB 04/2024 s/p repair  Allergies[1]  Patient Measurements: Height: 6' 1 (185.4 cm) Weight: 73.9 kg (162 lb 14.7 oz) IBW/kg (Calculated) : 79.9 Heparin  Dosing Weight: 74 kg   Vital Signs: Temp: 98.1 F (36.7 C) (01/19 1800) BP: 103/64 (01/19 1647) Pulse Rate: 133 (01/19 1800)  Labs: Recent Labs    06/16/24 1915 06/16/24 1931 06/16/24 1935 06/16/24 2020 06/16/24 2207 06/16/24 2209 06/17/24 0025 06/17/24 0456 06/17/24 0628 06/17/24 0916 06/17/24 1146 06/17/24 1759  HGB 9.3*   < > 11.2*   < >  --  8.9*   < > 9.5* 10.9* 10.5* 10.9*  --   HCT 32.2*   < > 33.0*   < >  --  29.9*   < > 31.3* 32.0* 31.0* 32.0*  --   PLT 334  --   --   --   --  341  --  355  --   --   --   --   APTT  --   --   --   --   --   --   --   --   --   --   --  56*  LABPROT 29.7*  --   --   --   --   --   --   --   --   --   --   --   INR 2.7*  --   --   --   --   --   --   --   --   --   --   --   CREATININE 1.44*  --  1.60*  --  1.39*  --   --  1.22  --   --   --   --    < > = values in this interval not displayed.     Assessment: 73 yoM presents from SNF with respiratory failure now with newly reduced LVEF (30-35%), AF RVR, and possible HCAP vs aspiration PNA..  On Eliquis  PTA for Afib (last dose 1/18 AM).  Pharmacy consulted for heparin  dosing.  Baseline CBC stable - Hgb 9.5, Plt 355.  Will use aPTT for therapeutic monitoring while DOAC impacting anti-Xa levels.  Per discussion with CCM, plan to defer bolus and pursue low-goal with hx of recent GIB, ICH and anemia at baseline.  aPTT 56 sec (subtherapeutic) on infusion at 1100 units/hr. No issues with line or bleeding reported per RN.  Goal of Therapy:  Heparin  level 0.3-0.5 units/ml aPTT 66-85 seconds  Monitor platelets by anticoagulation protocol:  Yes   Plan:  Increase heparin  infusion to 1200 units/hr 8 hour aPTT Daily CBC, anti-Xa level, aPTT until correlating Monitor for s/sx of bleeding  Maurilio Fila, PharmD Clinical Pharmacist 06/17/2024  6:47 PM      [1]  Allergies Allergen Reactions   Ace Inhibitors Anaphylaxis   Bactrim  Ds [Sulfamethoxazole -Trimethoprim ] Shortness Of Breath   Rythmol [Propafenone] Shortness Of Breath   Nitrofurantoin Rash

## 2024-06-17 NOTE — Progress Notes (Signed)
 ANTICOAGULATION CONSULT NOTE  Pharmacy Consult for heparin  Indication: Afib (Eliquis  PTA), CLI s/p thrombectomy/endarterectomy 02/2024 *hx ICH (cleared by [facility] for anticoagulation 02/2024), GIB 04/2024 s/p repair  Allergies[1]  Patient Measurements: Height: 6' 1 (185.4 cm) Weight: 73.9 kg (162 lb 14.7 oz) IBW/kg (Calculated) : 79.9 Heparin  Dosing Weight: 74 kg   Vital Signs: Temp: 99.1 F (37.3 C) (01/19 0815) Temp Source: Core (Comment) (01/18 2200) BP: 110/67 (01/19 0754) Pulse Rate: 142 (01/19 0815)  Labs: Recent Labs    06/16/24 1915 06/16/24 1931 06/16/24 1935 06/16/24 2020 06/16/24 2207 06/16/24 2209 06/17/24 0025 06/17/24 0456 06/17/24 0916  HGB 9.3*   < > 11.2*   < >  --  8.9* 11.2* 9.5* 10.5*  HCT 32.2*   < > 33.0*   < >  --  29.9* 33.0* 31.3* 31.0*  PLT 334  --   --   --   --  341  --  355  --   LABPROT 29.7*  --   --   --   --   --   --   --   --   INR 2.7*  --   --   --   --   --   --   --   --   CREATININE 1.44*  --  1.60*  --  1.39*  --   --  1.22  --    < > = values in this interval not displayed.    Estimated Creatinine Clearance: 53.8 mL/min (by C-G formula based on SCr of 1.22 mg/dL).  Medical History: Past Medical History:  Diagnosis Date   Aortic stenosis    Moderate AS with mean AVG 10.8, V-max 2.76m/s, SVI 28, DI 0.3   Carotid artery stenosis    1-39% bilaterally by dopplers 11/2022   Dyslipidemia    Hypertension    Inguinal hernia 2010   Small asymptomatic L inguinal Hernia   Peripheral vascular disease    Permanent atrial fibrillation (HCC)    atrial fib    Pneumonia    hx of    Prostate cancer (HCC) 07/2012   s/p prostatectomy   PSA elevation    workup per Dr. Alm Fragmin, March 2014   Sigmoid diverticulosis    on colonoscopy in 2010   Skin cancer 02/2012   Dr Renay Homer   Stroke [facility])    Tobacco dependence    rarely smokes-3 cigs per week    Medications:  See [facility]  Assessment: 60 yoM presents from SNF with respiratory  failure now with newly reduced LVEF (30-35%), AF RVR, and possible HCAP vs aspiration PNA..  On Eliquis  PTA for Afib (last dose 1/18 AM).  Pharmacy consulted for heparin  dosing.  Baseline CBC stable - Hgb 9.5, Plt 355.  Will use aPTT for therapeutic monitoring while DOAC impacting anti-Xa levels.  Per discussion with CCM, plan to defer bolus and pursue low-goal with hx of recent GIB, ICH and anemia at baseline.  Goal of Therapy:  Heparin  level 0.3-0.5 units/ml aPTT 66-85 seconds  Monitor platelets by anticoagulation protocol: Yes   Plan:  START heparin  infusion 1100 units/hr 8 hour aPTT Daily CBC, anti-Xa level, aPTT until correlating Monitor for s/sx of bleeding  Maurilio Fila, PharmD Clinical Pharmacist 06/17/2024  9:51 AM     [1]  Allergies Allergen Reactions   Ace Inhibitors Anaphylaxis   Bactrim  Ds [Sulfamethoxazole -Trimethoprim ] Shortness Of Breath   Rythmol [Propafenone] Shortness Of Breath   Nitrofurantoin Rash

## 2024-06-17 NOTE — Progress Notes (Signed)
 VENTILATOR WEAN NOTE 06/17/2024  Start Mode: PRVC  Wean Mode: Pressure Support  Duration before failure: n/a  Reason for failure: N/A  Notes: Recent intubation. Peep +10, FiO2 50%. No vent wean attempted due to settings & inadequate cough with suctioning.

## 2024-06-18 ENCOUNTER — Ambulatory Visit: Admitting: Student in an Organized Health Care Education/Training Program

## 2024-06-18 ENCOUNTER — Inpatient Hospital Stay (HOSPITAL_COMMUNITY)

## 2024-06-18 DIAGNOSIS — I739 Peripheral vascular disease, unspecified: Secondary | ICD-10-CM | POA: Diagnosis not present

## 2024-06-18 DIAGNOSIS — E871 Hypo-osmolality and hyponatremia: Secondary | ICD-10-CM | POA: Diagnosis not present

## 2024-06-18 DIAGNOSIS — J9601 Acute respiratory failure with hypoxia: Secondary | ICD-10-CM | POA: Diagnosis not present

## 2024-06-18 DIAGNOSIS — R578 Other shock: Secondary | ICD-10-CM | POA: Diagnosis not present

## 2024-06-18 DIAGNOSIS — R6521 Severe sepsis with septic shock: Secondary | ICD-10-CM | POA: Diagnosis not present

## 2024-06-18 DIAGNOSIS — E872 Acidosis, unspecified: Secondary | ICD-10-CM | POA: Diagnosis not present

## 2024-06-18 DIAGNOSIS — I10 Essential (primary) hypertension: Secondary | ICD-10-CM | POA: Diagnosis not present

## 2024-06-18 DIAGNOSIS — E785 Hyperlipidemia, unspecified: Secondary | ICD-10-CM | POA: Diagnosis not present

## 2024-06-18 DIAGNOSIS — N179 Acute kidney failure, unspecified: Secondary | ICD-10-CM | POA: Diagnosis not present

## 2024-06-18 DIAGNOSIS — I482 Chronic atrial fibrillation, unspecified: Secondary | ICD-10-CM | POA: Diagnosis not present

## 2024-06-18 DIAGNOSIS — E876 Hypokalemia: Secondary | ICD-10-CM | POA: Diagnosis not present

## 2024-06-18 DIAGNOSIS — A419 Sepsis, unspecified organism: Secondary | ICD-10-CM | POA: Diagnosis not present

## 2024-06-18 LAB — RENAL FUNCTION PANEL
Albumin: 2.4 g/dL — ABNORMAL LOW (ref 3.5–5.0)
Anion gap: 8 (ref 5–15)
BUN: 54 mg/dL — ABNORMAL HIGH (ref 8–23)
CO2: 31 mmol/L (ref 22–32)
Calcium: 8.2 mg/dL — ABNORMAL LOW (ref 8.9–10.3)
Chloride: 89 mmol/L — ABNORMAL LOW (ref 98–111)
Creatinine, Ser: 1.09 mg/dL (ref 0.61–1.24)
GFR, Estimated: >60 mL/min
Glucose, Bld: 167 mg/dL — ABNORMAL HIGH (ref 70–99)
Phosphorus: 4.2 mg/dL (ref 2.5–4.6)
Potassium: 5.1 mmol/L (ref 3.5–5.1)
Sodium: 128 mmol/L — ABNORMAL LOW (ref 135–145)

## 2024-06-18 LAB — CBC
HCT: 29.1 % — ABNORMAL LOW (ref 39.0–52.0)
Hemoglobin: 8.7 g/dL — ABNORMAL LOW (ref 13.0–17.0)
MCH: 23.5 pg — ABNORMAL LOW (ref 26.0–34.0)
MCHC: 29.9 g/dL — ABNORMAL LOW (ref 30.0–36.0)
MCV: 78.6 fL — ABNORMAL LOW (ref 80.0–100.0)
Platelets: 327 K/uL (ref 150–400)
RBC: 3.7 MIL/uL — ABNORMAL LOW (ref 4.22–5.81)
RDW: 19.1 % — ABNORMAL HIGH (ref 11.5–15.5)
WBC: 25.9 K/uL — ABNORMAL HIGH (ref 4.0–10.5)
nRBC: 0.2 % (ref 0.0–0.2)

## 2024-06-18 LAB — GLUCOSE, CAPILLARY
Glucose-Capillary: 118 mg/dL — ABNORMAL HIGH (ref 70–99)
Glucose-Capillary: 128 mg/dL — ABNORMAL HIGH (ref 70–99)
Glucose-Capillary: 136 mg/dL — ABNORMAL HIGH (ref 70–99)
Glucose-Capillary: 145 mg/dL — ABNORMAL HIGH (ref 70–99)
Glucose-Capillary: 162 mg/dL — ABNORMAL HIGH (ref 70–99)
Glucose-Capillary: 177 mg/dL — ABNORMAL HIGH (ref 70–99)

## 2024-06-18 LAB — APTT
aPTT: 70 s — ABNORMAL HIGH (ref 24–36)
aPTT: 62 s — ABNORMAL HIGH (ref 24–36)
aPTT: 82 s — ABNORMAL HIGH (ref 24–36)

## 2024-06-18 LAB — COMPREHENSIVE METABOLIC PANEL WITH GFR
ALT: 623 U/L — ABNORMAL HIGH (ref 0–44)
AST: 565 U/L — ABNORMAL HIGH (ref 15–41)
Albumin: 2.4 g/dL — ABNORMAL LOW (ref 3.5–5.0)
Alkaline Phosphatase: 110 U/L (ref 38–126)
Anion gap: 10 (ref 5–15)
BUN: 63 mg/dL — ABNORMAL HIGH (ref 8–23)
CO2: 29 mmol/L (ref 22–32)
Calcium: 8 mg/dL — ABNORMAL LOW (ref 8.9–10.3)
Chloride: 90 mmol/L — ABNORMAL LOW (ref 98–111)
Creatinine, Ser: 1.26 mg/dL — ABNORMAL HIGH (ref 0.61–1.24)
GFR, Estimated: 59 mL/min — ABNORMAL LOW
Glucose, Bld: 109 mg/dL — ABNORMAL HIGH (ref 70–99)
Potassium: 5 mmol/L (ref 3.5–5.1)
Sodium: 129 mmol/L — ABNORMAL LOW (ref 135–145)
Total Bilirubin: 0.5 mg/dL (ref 0.0–1.2)
Total Protein: 6.7 g/dL (ref 6.5–8.1)

## 2024-06-18 LAB — HEPARIN LEVEL (UNFRACTIONATED): Heparin Unfractionated: >1.1 [IU]/mL — ABNORMAL HIGH (ref 0.30–0.70)

## 2024-06-18 LAB — MAGNESIUM: Magnesium: 2.6 mg/dL — ABNORMAL HIGH (ref 1.7–2.4)

## 2024-06-18 LAB — UREA NITROGEN, URINE: Urea Nitrogen, Ur: 203 mg/dL

## 2024-06-18 MED ORDER — THIAMINE MONONITRATE 100 MG PO TABS
100.0000 mg | ORAL_TABLET | Freq: Every day | ORAL | Status: DC
  Administered 2024-06-18 – 2024-06-20 (×3): 100 mg
  Filled 2024-06-18 (×3): qty 1

## 2024-06-18 MED ORDER — VITAL 1.5 CAL PO LIQD
1000.0000 mL | ORAL | Status: DC
  Administered 2024-06-18: 1000 mL

## 2024-06-18 MED ORDER — SODIUM CHLORIDE 0.9 % IV SOLN
2.0000 g | INTRAVENOUS | Status: DC
  Administered 2024-06-18 – 2024-06-22 (×5): 2 g via INTRAVENOUS
  Filled 2024-06-18 (×5): qty 20

## 2024-06-18 MED ORDER — SMOG ENEMA
960.0000 mL | Freq: Once | RECTAL | Status: AC
  Administered 2024-06-18: 960 mL via RECTAL
  Filled 2024-06-18: qty 960

## 2024-06-18 MED ORDER — DEXTROSE 5 % IV SOLN
60.0000 mg/h | INTRAVENOUS | Status: DC
  Administered 2024-06-18 – 2024-06-19 (×4): 60 mg/h via INTRAVENOUS
  Filled 2024-06-18: qty 9
  Filled 2024-06-18 (×2): qty 450
  Filled 2024-06-18: qty 9

## 2024-06-18 MED ORDER — HALOPERIDOL LACTATE 5 MG/ML IJ SOLN
5.0000 mg | Freq: Four times a day (QID) | INTRAMUSCULAR | Status: DC | PRN
  Administered 2024-06-18 (×2): 2.5 mg via INTRAVENOUS
  Filled 2024-06-18 (×2): qty 1

## 2024-06-18 MED ORDER — PROSOURCE TF20 ENFIT COMPATIBL EN LIQD
60.0000 mL | Freq: Three times a day (TID) | ENTERAL | Status: DC
  Administered 2024-06-18 – 2024-06-19 (×4): 60 mL
  Filled 2024-06-18 (×4): qty 60

## 2024-06-18 MED ORDER — DEXMEDETOMIDINE HCL IN NACL 400 MCG/100ML IV SOLN
0.0000 ug/kg/h | INTRAVENOUS | Status: DC
  Administered 2024-06-18: 0.6 ug/kg/h via INTRAVENOUS
  Administered 2024-06-18: 0.4 ug/kg/h via INTRAVENOUS
  Administered 2024-06-19: 1.1 ug/kg/h via INTRAVENOUS
  Filled 2024-06-18 (×4): qty 100

## 2024-06-18 MED ORDER — AMIODARONE LOAD VIA INFUSION
150.0000 mg | Freq: Once | INTRAVENOUS | Status: AC
  Administered 2024-06-18: 150 mg via INTRAVENOUS
  Filled 2024-06-18: qty 83.34

## 2024-06-18 MED ORDER — HALOPERIDOL LACTATE 5 MG/ML IJ SOLN
INTRAMUSCULAR | Status: AC
  Filled 2024-06-18: qty 1

## 2024-06-18 MED ORDER — AMIODARONE HCL IN DEXTROSE 360-4.14 MG/200ML-% IV SOLN: 60.0000 mg/h | INTRAVENOUS | Status: DC

## 2024-06-18 MED ORDER — FUROSEMIDE 10 MG/ML IJ SOLN
40.0000 mg | Freq: Once | INTRAMUSCULAR | Status: AC
  Administered 2024-06-18: 40 mg via INTRAVENOUS
  Filled 2024-06-18: qty 4

## 2024-06-18 MED ORDER — AMIODARONE IV BOLUS ONLY 150 MG/100ML: 150.0000 mg | Freq: Once | INTRAVENOUS | Status: DC

## 2024-06-18 NOTE — Progress Notes (Signed)
 VENTILATOR WEAN NOTE 06/18/2024  Start Mode: PRVC  Wean Mode: Pressure Support  Duration before failure:   Reason for failure: N/A  Notes: Patient placed on vent wean PS/CPAP 5/5 by CCM.

## 2024-06-18 NOTE — Progress Notes (Signed)
 PHARMACY - ANTICOAGULATION CONSULT NOTE  Pharmacy Consult for heparin  Indication: atrial fibrillation  Labs: Recent Labs    06/16/24 1915 06/16/24 1931 06/16/24 2209 06/17/24 0025 06/17/24 0456 06/17/24 0628 06/17/24 0916 06/17/24 1146 06/17/24 1759 06/17/24 2004 06/18/24 0305 06/18/24 1109 06/18/24 1552 06/18/24 2200  HGB 9.3*   < > 8.9*   < > 9.5*   < > 10.5* 10.9*  --   --  8.7*  --   --   --   HCT 32.2*   < > 29.9*   < > 31.3*   < > 31.0* 32.0*  --   --  29.1*  --   --   --   PLT 334  --  341  --  355  --   --   --   --   --  327  --   --   --   APTT  --   --   --   --   --   --   --   --    < >  --  70* 62*  --  82*  LABPROT 29.7*  --   --   --   --   --   --   --   --   --   --   --   --   --   INR 2.7*  --   --   --   --   --   --   --   --   --   --   --   --   --   HEPARINUNFRC  --   --   --   --   --   --   --   --   --   --  >1.10*  --   --   --   CREATININE 1.44*   < >  --   --  1.22  --   --   --   --  1.06 1.09  --  1.26*  --    < > = values in this interval not displayed.   Assessment/Plan:  78yo male therapeutic on heparin  after rate change. Will continue infusion at current rate of 1300 units/hr and monitor daily labs.  Marvetta Dauphin, PharmD, BCPS 06/18/2024 11:43 PM

## 2024-06-18 NOTE — Progress Notes (Signed)
Vent changes made per CCM

## 2024-06-18 NOTE — Progress Notes (Signed)
 VENTILATOR WEAN NOTE 06/18/2024  Start Mode: PRVC  Wean Mode: Pressure Support  Duration before failure: 2 minutes  Reason for failure: Apnea  Notes: Patient placed on vent wean PS/CPAP 16/8. Patient has periods of apnea when not stimulated. Patient placed back on full vent support. CCM and RN at bedside.

## 2024-06-18 NOTE — Plan of Care (Signed)
" °  Problem: Nutritional: Goal: Maintenance of adequate nutrition will improve Outcome: Progressing   Problem: Role Relationship: Goal: Method of communication will improve Outcome: Progressing   Problem: Education: Goal: Knowledge of General Education information will improve Description: Including pain rating scale, medication(s)/side effects and non-pharmacologic comfort measures Outcome: Progressing   Problem: Respiratory: Goal: Ability to maintain a clear airway and adequate ventilation will improve Outcome: Not Progressing   "

## 2024-06-18 NOTE — Progress Notes (Signed)
 Initial Nutrition Assessment  DOCUMENTATION CODES:   Severe malnutrition in context of chronic illness  INTERVENTION:   Tube Feeding via OG:  Change to Vital 1.5 formula, continue at 10 ml/hr only today per PCCM TF at goal: Vital 1.5 at 60 ml/hr with Pro-Source TF20 60 mL BID TF at goal provides 137 g of protein, 2320 kcals and 1094 mL of free water   Add wound specific nutrition therapy once tolerating TF at goal  Add Thiamine  100 mg daily x 7 days  NUTRITION DIAGNOSIS:   Severe Malnutrition related to chronic illness as evidenced by severe muscle depletion, severe fat depletion, edema.  GOAL:   Patient will meet greater than or equal to 90% of their needs  MONITOR:   Vent status, Labs, TF tolerance, Weight trends, I & O's  REASON FOR ASSESSMENT:   Consult Enteral/tube feeding initiation and management  ASSESSMENT:   79 yo male admitted with acute respiratory failure with pneumonia (aspiration vs HCAP) and CHF requiring intubation, +shock (mixed: cardiogenic and septic). Pt with recent hx of GI bleed with  perforated ulcer requiring graham patch repair and TPN for nutrition in December of 2025, acute left leg ischemia requiring stent in October 2025. PMH includes HTN, prostate cancer, anemia, malnutrition, PVD/PAD  1/18 Admitted, Intubated 1/19 Vital High Protein started at 10 ml/hr, US : suggesting cirrhosis, perihepatic ascites, GB wall thickening  OG tube in proximal stomach, bowel gas pattern unremarkable per xray imaging report on 1/19  Pt remains on vent support Levophed  was down to 6 this AM but currently titrating back up. Vasopressin  at 0.04  Vital High Protein at 10 ml/hr via OG  Lasix  x 1 today, only 194 mL UOP so far today, 635 mL in 24 hours  Pt with deep pitting edema in BLE, edema noted in abdomen as well Current wt: 75 kg Admit Wt: 73.9 kg Current dry weight unknown, unclear info on UBW  Wife at bedside. Wife indicates that pt has been told to  eat more protein. Pt has been at [facility] for Rehab and had been taking Magic Cup and Boost supplements. Unable to ascertain how well pt was eating with meals while at rehab  +small liquid BM with small hard lumps post enema today; no other BM yet. Per report, +stool  burden on imaging. PCCM aware, adjusting bowel regimen  Midline abdominal wound/surgical incision appears to be healing; pt with open surgical incision to leg (pretibial region) from 02/2024  Unstageable pressure injury to sacrum  Labs: Sodium 128 (L) Potassium 5.1 (wdl) BUN 54  Creatinine 1.09 (wdl) Phosphorus 4.2 (wdl)  Meds: Lasix  x 1 Pepcid  Miralax  BID Senna-Docusate daily SS novolog  SMOG enema x 1    NUTRITION - FOCUSED PHYSICAL EXAM:  Flowsheet Row Most Recent Value  Orbital Region Severe depletion  Upper Arm Region Severe depletion  Thoracic and Lumbar Region Severe depletion  Buccal Region Unable to assess  Temple Region Severe depletion  Clavicle Bone Region Severe depletion  Clavicle and Acromion Bone Region Severe depletion  Scapular Bone Region Severe depletion  Dorsal Hand Unable to assess  Patellar Region Unable to assess  Anterior Thigh Region Unable to assess  Posterior Calf Region Unable to assess  Edema (RD Assessment) Severe    Diet Order:   Diet Order             Diet NPO time specified  Diet effective now  EDUCATION NEEDS:   Education needs have been addressed  Skin:  Skin Assessment: Skin Integrity Issues: Skin Integrity Issues:: Unstageable Unstageable: sacrum (image noted in chart)  Last BM:  1/20 +small amount of stool post enema (type seven with small hard lumps), +flatus  Height:   Ht Readings from Last 1 Encounters:  06/16/24 6' 1 (1.854 m)    Weight:   Wt Readings from Last 1 Encounters:  06/18/24 75 kg    BMI:  Body mass index is 21.81 kg/m.  Estimated Nutritional Needs:   Kcal:  2200-2400 kcals  Protein:  125-155  g  Fluid:  1.8 L   Betsey Finger MS, RDN, LDN, CNSC Registered Dietitian 3 Clinical Nutrition RD Inpatient Contact Info in Amion

## 2024-06-18 NOTE — Progress Notes (Signed)
 Pharmacy mixing Amiodarone  bags due to medication supply shortage. Confirmed with pharmacist Maurilio Fila and provider Fonda Sharps, NP, no order available to match premix bag for bolus on MAR.   Amiodarone  HCL 450 mg in dextrose  5% 250 mL infusing.  Amiodarone  150 mg bolus given from premix bag above, appears as Amiodarone  1.8 mg/mL load via infusion on MAR.

## 2024-06-18 NOTE — Progress Notes (Signed)
 ANTICOAGULATION CONSULT NOTE  Pharmacy Consult for heparin  Indication: Afib (Eliquis  PTA), CLI s/p thrombectomy/endarterectomy 02/2024 *hx ICH (cleared by [facility] for anticoagulation 02/2024), GIB 04/2024 s/p repair  Allergies[1]  Patient Measurements: Height: 6' 1 (185.4 cm) Weight: 73.9 kg (162 lb 14.7 oz) IBW/kg (Calculated) : 79.9 Heparin  Dosing Weight: 74 kg   Vital Signs: Temp: 98.1 F (36.7 C) (01/20 0337) Temp Source: Bladder (01/20 0000) BP: 103/64 (01/19 1647) Pulse Rate: 119 (01/20 0337)  Labs: Recent Labs    06/16/24 1915 06/16/24 1931 06/16/24 2209 06/17/24 0025 06/17/24 0456 06/17/24 0628 06/17/24 0916 06/17/24 1146 06/17/24 1759 06/17/24 2004 06/18/24 0305  HGB 9.3*   < > 8.9*   < > 9.5*   < > 10.5* 10.9*  --   --  8.7*  HCT 32.2*   < > 29.9*   < > 31.3*   < > 31.0* 32.0*  --   --  29.1*  PLT 334  --  341  --  355  --   --   --   --   --  327  APTT  --   --   --   --   --   --   --   --  56*  --  70*  LABPROT 29.7*  --   --   --   --   --   --   --   --   --   --   INR 2.7*  --   --   --   --   --   --   --   --   --   --   HEPARINUNFRC  --   --   --   --   --   --   --   --   --   --  >1.10*  CREATININE 1.44*   < >  --   --  1.22  --   --   --   --  1.06 1.09   < > = values in this interval not displayed.     Assessment: 14 yoM presents from SNF with respiratory failure now with newly reduced LVEF (30-35%), AF RVR, and possible HCAP vs aspiration PNA..  On Eliquis  PTA for Afib (last dose 1/18 AM).  Pharmacy consulted for heparin  dosing.  Baseline CBC stable - Hgb 9.5, Plt 355.  Will use aPTT for therapeutic monitoring while DOAC impacting anti-Xa levels.  Per discussion with CCM, plan to defer bolus and pursue low-goal with hx of recent GIB, ICH and anemia at baseline.  aPTT 70 sec (therapeutic) on infusion at 1200 units/hr. No issues with line or bleeding reported per RN. CBC shows Hgb 10.7 > 8.7, PLTS wnl.  Goal of Therapy:  Heparin  level 0.3-0.5  units/ml aPTT 66-85 seconds  Monitor platelets by anticoagulation protocol: Yes   Plan:  Continue heparin  infusion at 1200 units/hr 8 hour aPTT Daily CBC, anti-Xa level, aPTT until correlating Monitor for s/sx of bleeding  Lynwood Poplar, PharmD, BCPS Clinical Pharmacist 06/18/2024 3:46 AM         [1]  Allergies Allergen Reactions   Ace Inhibitors Anaphylaxis   Bactrim  Ds [Sulfamethoxazole -Trimethoprim ] Shortness Of Breath   Rythmol [Propafenone] Shortness Of Breath   Nitrofurantoin Rash

## 2024-06-18 NOTE — Progress Notes (Addendum)
 "  NAME:  Kyle Kelley, MRN:  [id number], DOB:  Sep 07, 1945, LOS: 2 ADMISSION DATE:  06/16/2024, CONSULTATION DATE:  06/16/24 REFERRING MD:  EDP, CHIEF COMPLAINT:  acute hypoxic resp failure   History of Present Illness:  79 yo male was reportedly in normal state of health at rehab facility until yesterday afternoon/evening. Pt has complex medical history with frequent hospitalizations since October 2/2 PAD, GI bleed with perforated duodenal ulcer, wound dehiscence with wound vac placement and need for rehab/snf placement at d/c  .   All history is obtained from chart review and wife at bedside as pt is intubated and sedated.   Per wife pt was participating in rehab yesterday, walking, eating overall feeling well yesterday. In evening he did not eat much and went to bed. Unclear about events of today, per her however tonight he presented to [facility] via EMS after being found in resp distress. EMS found him unresponsive with diffuse wheezing. He was recently started on diuretics 2/2 increased edema since surgery. He was also in afib with rvr. Upon arrival to ed he was unresponsive and emergently intubated for hypoxia and airway protection. He was hypotensive unresponsive to fluids and started on levo escalated up to 25mcg at this time  Ccm asked to admit for resp failure and shock. Wife requests full code for now however expressed understanding of critical state, indicated would not want long term support.  Pertinent  Medical History  Afib on chronic a/c Pad with chronic LLE non healing wound Htn Hyperlipidemia Ao stenosis H/o cva H/o prostate cancer H/o LLE arterial occlusion with critical limb ischemia 10/25, s/p thrombectomy H/o GI bleed 2/2 duodenal ulcer 12/25 H/o perforated small bowel s/p Arlyss patch repair and lengthy TPN  Significant Hospital Events: Including procedures, antibiotic start and stop dates in addition to other pertinent events   Admitted to ICU 06/16/24 1/19 Intubated,  sedated, Afib RVR, on pressors  1/20 Pressor requirements improving, Afib 120s-130s, now following commands, still no BM- SMOG enema given   Interim History / Subjective:  No significant events overnight  Overall improving, pressor requirements decreasing   Afib 120s-130s   Objective    Blood pressure 104/64, pulse (!) 115, temperature (!) 97.2 F (36.2 C), resp. rate 20, height 6' 1 (1.854 m), weight 75 kg, SpO2 97%. CVP:  [7 mmHg-17 mmHg] 15 mmHg  Vent Mode: PRVC FiO2 (%):  [40 %-50 %] 50 % Set Rate:  [16 bmp-18 bmp] 16 bmp Vt Set:  [440 mL-500 mL] 480 mL PEEP:  [5 cmH20-10 cmH20] 7 cmH20 Pressure Support:  [5 cmH20] 5 cmH20 Plateau Pressure:  [16 cmH20-24 cmH20] 16 cmH20   Intake/Output Summary (Last 24 hours) at 06/18/2024 1152 Last data filed at 06/18/2024 1114 Gross per 24 hour  Intake 3182.11 ml  Output 542 ml  Net 2640.11 ml   Filed Weights   06/16/24 2200 06/18/24 0800  Weight: 73.9 kg 75 kg    Examination: General: acute on chronically ill adult male, lying in icu bed on vent NAD HEENT: Normocephalic, PERRLA intact, ETT, OG , poor dentition Pink MM CV: s1,s2, irregular-A-fib 120s 130s, no MRG, No JVD  pulm: clear, diminished, no distress on vent, starting to wean Abs: bs active, soft, midline incision-now scar Extremities: Nonpitting edema in lower extremities , no deformity, moves all extremities on command  Skin: no rash  Neuro: Rass -1, responds to painful stimuli, cough gag reflex present, follows commands GU: foley intact   Resolved problem list  Assessment and Plan  Acute hypoxic resp failure req mechanical ventilation Acute pneumonia HCAP vs aspiration but also with component of CHF  MRSA negative -CTA negative for PE -Strep Legionella pending -Strep urinary antigen negative -RVP panel negative Currently blood cultures x 2-no growth to date P:  Continue ventilator support and lung protective strategies  Continue LTVV  Wean PEEP and Fio2  requirements to sat goal of >92%  HOB > 30 degrees Plat < 30  Aim for Driving pressures < 15  Intermittent Chest X-ray and ABGS Continue follow cultures-blood and tracheal aspirate VAP and PAD protocols in place  Wean sedation as tolerated, SBT and WUA daily   Currently off sedation, beginning to follow commands, if patient performs well with weaning will hopefully extubate later on this afternoon  Mixed Shock- Septic with Cardiogenic Shock  Lactic Acidosis Elevated BNP- >8,000 Echo obtained- 30-35%, LV moderately decreased, global hypokinesis, RV systolic function normal- RV/LV mildly dilated  MV degenerative, Trivial MV regurg, severe mitral annular calcification, moderated calcification of aortic valve, no stenosis  - Last ECHO showing 50-55%  Suspecting that cardiogenic component induced by sepsis as well as uncontrolled A-fib Co. ox 64 on 1/19, TSH in normal range Troponin >1 10 > 95 > 94 Lactate cleared-1.7 P: Continue cardiac telemetry Continue MAP goal greater than 65, continue levo and vaso-continue to wean And stop stress to steroids can transition off of Flagyl  but continue cefepime  for now Continue to follow blood cultures   Afib RVR -hx of chronic afib on eliquis    Recent LE DVT s/p Thrombectomy  Acute Ischemic Leg in 02/2024- s/p left tibioperoneal trunk endarterectomy with vein patch angioplasty, and stenting of left SFA- on plavix   ICH in 2025  PAD -TSH and within normal range P: Continue to monitor electrolytes Obtain renal function panel with magnesium  later this afternoon 01/20 Replace as needed Give additional amio bolus, increase Amio gtt, continue heparin  gtt  AKI-improving  Chronic Hyponatremia  Hypokalemia  - Renal ultrasound on 1/19-negative for hydronephrosis P: Continue to trend renal function daily  Continue to monitor and optimize electrolytes daily Continue to monitor urine output Continue strict I/Os Continue Adequate renal perfusion   Avoid nephrotoxic agents   Acute on Chronic Anemia Hx of GI bleed- 05/04/24-EGD showing duodenal ulcer Hx of perforated duodenal ulcer-taken to OR on 12/9 s/p ex-lap, graham patch repeair  - no evidence of overt bleeding Hgb 9.5 on 1/19  Hemoglobin 8.7 on 1/20 P:  Continue to trend CBC daily Continue to monitor for signs of bleeding Transfuse for hemoglobin less than 7  HTN HLD  P: Continue to hold antihypertensives for now in setting of vasopressors Continue to hold statin  Elevated LFTs- suspect related to shock state Ammonia-39  1/19-ultrasound suggesting cirrhosis, Perry hepatic ascites, gallbladder wall thickening-but no gallstones or peicholecystic fluid P: Repeat CMP this afternoon to evaluate LFTs Continue to trend Continue to monitor    Labs   CBC: Recent Labs  Lab 06/16/24 1915 06/16/24 1931 06/16/24 2209 06/17/24 0025 06/17/24 0456 06/17/24 0628 06/17/24 0916 06/17/24 1146 06/18/24 0305  WBC 24.3*  --  25.1*  --  28.1*  --   --   --  25.9*  NEUTROABS 23.0*  --   --   --   --   --   --   --   --   HGB 9.3*   < > 8.9*   < > 9.5* 10.9* 10.5* 10.9* 8.7*  HCT 32.2*   < >  29.9*   < > 31.3* 32.0* 31.0* 32.0* 29.1*  MCV 82.1  --  79.9*  --  76.9*  --   --   --  78.6*  PLT 334  --  341  --  355  --   --   --  327   < > = values in this interval not displayed.    Basic Metabolic Panel: Recent Labs  Lab 06/16/24 1915 06/16/24 1931 06/16/24 1935 06/16/24 2020 06/16/24 2207 06/17/24 0025 06/17/24 0456 06/17/24 9371 06/17/24 0916 06/17/24 1146 06/17/24 2004 06/18/24 0305  NA 133*   < > 130*   < > 130*   < > 130* 132* 130* 130* 130* 128*  K 3.9   < > 3.8   < > 3.6   < > 3.2* 3.0* 3.3* 4.0 4.5 5.1  CL 89*  --  89*  --  86*  --  87*  --   --   --  89* 89*  CO2 33*  --   --   --  33*  --  31  --   --   --  33* 31  GLUCOSE 93  --  96  --  148*  --  164*  --   --   --  152* 167*  BUN 44*  --  50*  --  48*  --  48*  --   --   --  51* 54*  CREATININE  1.44*  --  1.60*  --  1.39*  --  1.22  --   --   --  1.06 1.09  CALCIUM  8.1*  --   --   --  7.9*  --  8.2*  --   --   --  8.0* 8.2*  MG  --   --   --   --   --   --  2.5*  --   --   --   --   --   PHOS  --   --   --   --   --   --   --   --   --   --   --  4.2   < > = values in this interval not displayed.   GFR: Estimated Creatinine Clearance: 61.2 mL/min (by C-G formula based on SCr of 1.09 mg/dL). Recent Labs  Lab 06/16/24 1915 06/16/24 1936 06/16/24 2209 06/17/24 0025 06/17/24 0046 06/17/24 0456 06/17/24 0929 06/17/24 1144 06/17/24 2004 06/18/24 0305  WBC 24.3*  --  25.1*  --   --  28.1*  --   --   --  25.9*  LATICACIDVEN  --    < >  --    < > 3.4*  --  2.0* 2.0* 1.7  --    < > = values in this interval not displayed.    Liver Function Tests: Recent Labs  Lab 06/16/24 1915 06/17/24 0456 06/18/24 0305  AST 330* 590*  --   ALT 272* 481*  --   ALKPHOS 70 72  --   BILITOT 0.5 0.6  --   PROT 7.1 6.6  --   ALBUMIN  2.5* 2.4* 2.4*   No results for input(s): LIPASE, AMYLASE in the last 168 hours. Recent Labs  Lab 06/16/24 2209  AMMONIA 39*    ABG    Component Value Date/Time   PHART 7.417 06/17/2024 1146   PCO2ART 56.6 (H) 06/17/2024 1146   PO2ART 89 06/17/2024 1146   HCO3 36.5 (  H) 06/17/2024 1146   TCO2 38 (H) 06/17/2024 1146   O2SAT 97 06/17/2024 1146     Coagulation Profile: Recent Labs  Lab 06/16/24 1915  INR 2.7*    Cardiac Enzymes: No results for input(s): CKTOTAL, CKMB, CKMBINDEX, TROPONINI in the last 168 hours.  HbA1C: Hgb A1c MFr Bld  Date/Time Value Ref Range Status  02/29/2024 04:22 AM 5.7 (H) 4.8 - 5.6 % Final    Comment:    (NOTE) Diagnosis of Diabetes The following HbA1c ranges recommended by the American Diabetes Association (ADA) may be used as an aid in the diagnosis of diabetes mellitus.  Hemoglobin             Suggested A1C NGSP%              Diagnosis  <5.7                   Non Diabetic  5.7-6.4                 Pre-Diabetic  >6.4                   Diabetic  <7.0                   Glycemic control for                       adults with diabetes.    09/03/2020 04:51 AM 5.9 (H) 4.8 - 5.6 % Final    Comment:    (NOTE) Pre diabetes:          5.7%-6.4%  Diabetes:              >6.4%  Glycemic control for   <7.0% adults with diabetes     CBG: Recent Labs  Lab 06/17/24 1701 06/17/24 1931 06/17/24 2307 06/18/24 0304 06/18/24 0842  GLUCAP 164* 148* 162* 162* 177*   Home Medications  Prior to Admission medications  Medication Sig Start Date End Date Taking? Authorizing Provider  apixaban  (ELIQUIS ) 5 MG TABS tablet Take 5 mg by mouth 2 (two) times daily.   Yes [provider]  clopidogrel  (PLAVIX ) 75 MG tablet Take 1 tablet (75 mg total) by mouth daily. 05/19/24   Gherghe, Costin M, MD  diltiazem  (TIADYLT  ER) 300 MG 24 hr capsule TAKE 1 CAPSULE BY MOUTH EVERY DAY FOR 90 DAYS; Duration: 90    [provider]  docusate sodium  (COLACE) 100 MG capsule Take 100 mg by mouth daily as needed for mild constipation.    [provider]  EPINEPHrine  (EPIPEN  IJ) Inject 1 application  as directed as needed (anaphylaxis).    [provider]  metolazone (ZAROXOLYN) 2.5 MG tablet Take 2.5 mg by mouth 3 (three) times a week.    [provider]  metoprolol  succinate (TOPROL -XL) 25 MG 24 hr tablet Take 1 tablet (25 mg total) by mouth daily. 12/25/23   Shlomo Wilbert SAUNDERS, MD  pantoprazole  (PROTONIX ) 40 MG tablet Take 1 tablet (40 mg total) by mouth 2 (two) times daily. 05/19/24   Trixie Nilda HERO, MD  Potassium Chloride  ER 20 MEQ TBCR Take by mouth. Patient taking differently: Take 20 mEq by mouth 4 (four) times a week.    [provider]  rosuvastatin  (CRESTOR ) 10 MG tablet Take 1 tablet (10 mg total) by mouth daily. 12/25/23   Shlomo Wilbert SAUNDERS, MD  torsemide (DEMADEX) 20 MG tablet Take 20 mg by mouth daily.  [provider]     Critical care time:  55 mins     [facility]   [facility] 06/13/2024, 8:41 PM  Call Cell: [phone] if have any emergent needs          "

## 2024-06-18 NOTE — Progress Notes (Signed)
 ANTICOAGULATION CONSULT NOTE  Pharmacy Consult for heparin  Indication: Afib (Eliquis  PTA), CLI s/p thrombectomy/endarterectomy 02/2024 *hx ICH (cleared by [facility] for anticoagulation 02/2024), GIB 04/2024 s/p repair  Allergies[1]  Patient Measurements: Height: 6' 1 (185.4 cm) Weight: 75 kg (165 lb 5.5 oz) IBW/kg (Calculated) : 79.9 Heparin  Dosing Weight: 74 kg   Vital Signs: Temp: 97 F (36.1 C) (01/20 0903) Temp Source: Bladder (01/20 0000) BP: 100/62 (01/20 0801) Pulse Rate: 113 (01/20 0903)  Labs: Recent Labs    06/16/24 1915 06/16/24 1931 06/16/24 2209 06/17/24 0025 06/17/24 0456 06/17/24 0628 06/17/24 0916 06/17/24 1146 06/17/24 1759 06/17/24 2004 06/18/24 0305  HGB 9.3*   < > 8.9*   < > 9.5*   < > 10.5* 10.9*  --   --  8.7*  HCT 32.2*   < > 29.9*   < > 31.3*   < > 31.0* 32.0*  --   --  29.1*  PLT 334  --  341  --  355  --   --   --   --   --  327  APTT  --   --   --   --   --   --   --   --  56*  --  70*  LABPROT 29.7*  --   --   --   --   --   --   --   --   --   --   INR 2.7*  --   --   --   --   --   --   --   --   --   --   HEPARINUNFRC  --   --   --   --   --   --   --   --   --   --  >1.10*  CREATININE 1.44*   < >  --   --  1.22  --   --   --   --  1.06 1.09   < > = values in this interval not displayed.     Assessment: 35 yoM presents from SNF with respiratory failure now with newly reduced LVEF (30-35%), AF RVR, and possible HCAP vs aspiration PNA..  On Eliquis  PTA for Afib (last dose 1/18 AM).  Pharmacy consulted for heparin  dosing.  Per discussion with CCM 1/19, plan to defer bolus and pursue low-goal with hx of recent GIB, ICH and anemia at baseline.  Confirmatory aPTT 62 sec now below goal after therapeutic level this morning on heparin  1200 units/hr.  No issues with line or bleeding reported per RN.   Hgb down to 8.7 today, pltc WNL - likely dilutional as patient is net +4L.  Goal of Therapy:  Heparin  level 0.3-0.5 units/ml aPTT 66-85  seconds  Monitor platelets by anticoagulation protocol: Yes   Plan:  Increase heparin  infusion to 1300 units/hr 8 hour aPTT Daily CBC, anti-Xa level, aPTT until correlating Monitor for s/sx of bleeding  Maurilio Fila, PharmD Clinical Pharmacist 06/18/2024  1:41 PM    [1]  Allergies Allergen Reactions   Ace Inhibitors Anaphylaxis   Bactrim  Ds [Sulfamethoxazole -Trimethoprim ] Shortness Of Breath   Rythmol [Propafenone] Shortness Of Breath   Nitrofurantoin Rash

## 2024-06-18 NOTE — Progress Notes (Signed)
"  °  ° °  Pharmacy mixing Amiodarone  bags due to medication supply shortage. Confirmed with pharmacist Maurilio Fila and provider Fonda Sharps, NP, no order available to match premix bag for bolus on MAR.    Amiodarone  HCL 450 mg in dextrose  5% 250 mL infusing.  Amiodarone  150 mg bolus given from premix bag above, appears as Amiodarone  1.8 mg/mL load via infusion on MAR.          "

## 2024-06-19 DIAGNOSIS — I4891 Unspecified atrial fibrillation: Secondary | ICD-10-CM | POA: Diagnosis not present

## 2024-06-19 DIAGNOSIS — E878 Other disorders of electrolyte and fluid balance, not elsewhere classified: Secondary | ICD-10-CM | POA: Diagnosis not present

## 2024-06-19 DIAGNOSIS — N179 Acute kidney failure, unspecified: Secondary | ICD-10-CM | POA: Diagnosis not present

## 2024-06-19 DIAGNOSIS — K922 Gastrointestinal hemorrhage, unspecified: Secondary | ICD-10-CM | POA: Diagnosis not present

## 2024-06-19 DIAGNOSIS — D649 Anemia, unspecified: Secondary | ICD-10-CM | POA: Diagnosis not present

## 2024-06-19 DIAGNOSIS — E871 Hypo-osmolality and hyponatremia: Secondary | ICD-10-CM | POA: Diagnosis not present

## 2024-06-19 DIAGNOSIS — J810 Acute pulmonary edema: Secondary | ICD-10-CM | POA: Diagnosis not present

## 2024-06-19 DIAGNOSIS — J9601 Acute respiratory failure with hypoxia: Secondary | ICD-10-CM | POA: Diagnosis not present

## 2024-06-19 DIAGNOSIS — R7401 Elevation of levels of liver transaminase levels: Secondary | ICD-10-CM | POA: Diagnosis not present

## 2024-06-19 DIAGNOSIS — R579 Shock, unspecified: Secondary | ICD-10-CM

## 2024-06-19 DIAGNOSIS — J189 Pneumonia, unspecified organism: Secondary | ICD-10-CM | POA: Diagnosis not present

## 2024-06-19 LAB — COMPREHENSIVE METABOLIC PANEL WITH GFR
ALT: 730 U/L — ABNORMAL HIGH (ref 0–44)
ALT: 771 U/L — ABNORMAL HIGH (ref 0–44)
ALT: 720 U/L — ABNORMAL HIGH (ref 0–44)
AST: 667 U/L — ABNORMAL HIGH (ref 15–41)
AST: 738 U/L — ABNORMAL HIGH (ref 15–41)
AST: 594 U/L — ABNORMAL HIGH (ref 15–41)
Albumin: 2.4 g/dL — ABNORMAL LOW (ref 3.5–5.0)
Albumin: 2.5 g/dL — ABNORMAL LOW (ref 3.5–5.0)
Albumin: 2.4 g/dL — ABNORMAL LOW (ref 3.5–5.0)
Alkaline Phosphatase: 120 U/L (ref 38–126)
Alkaline Phosphatase: 123 U/L (ref 38–126)
Alkaline Phosphatase: 118 U/L (ref 38–126)
Anion gap: 11 (ref 5–15)
Anion gap: 12 (ref 5–15)
Anion gap: 11 (ref 5–15)
BUN: 75 mg/dL — ABNORMAL HIGH (ref 8–23)
BUN: 75 mg/dL — ABNORMAL HIGH (ref 8–23)
BUN: 75 mg/dL — ABNORMAL HIGH (ref 8–23)
CO2: 28 mmol/L (ref 22–32)
CO2: 27 mmol/L (ref 22–32)
CO2: 30 mmol/L (ref 22–32)
Calcium: 7.9 mg/dL — ABNORMAL LOW (ref 8.9–10.3)
Calcium: 7.9 mg/dL — ABNORMAL LOW (ref 8.9–10.3)
Calcium: 7.8 mg/dL — ABNORMAL LOW (ref 8.9–10.3)
Chloride: 89 mmol/L — ABNORMAL LOW (ref 98–111)
Chloride: 91 mmol/L — ABNORMAL LOW (ref 98–111)
Chloride: 91 mmol/L — ABNORMAL LOW (ref 98–111)
Creatinine, Ser: 1.36 mg/dL — ABNORMAL HIGH (ref 0.61–1.24)
Creatinine, Ser: 1.35 mg/dL — ABNORMAL HIGH (ref 0.61–1.24)
Creatinine, Ser: 1.3 mg/dL — ABNORMAL HIGH (ref 0.61–1.24)
GFR, Estimated: 54 mL/min — ABNORMAL LOW
GFR, Estimated: 54 mL/min — ABNORMAL LOW
GFR, Estimated: 57 mL/min — ABNORMAL LOW
Glucose, Bld: 132 mg/dL — ABNORMAL HIGH (ref 70–99)
Glucose, Bld: 140 mg/dL — ABNORMAL HIGH (ref 70–99)
Glucose, Bld: 146 mg/dL — ABNORMAL HIGH (ref 70–99)
Potassium: 4.8 mmol/L (ref 3.5–5.1)
Potassium: 4.8 mmol/L (ref 3.5–5.1)
Potassium: 4.2 mmol/L (ref 3.5–5.1)
Sodium: 128 mmol/L — ABNORMAL LOW (ref 135–145)
Sodium: 130 mmol/L — ABNORMAL LOW (ref 135–145)
Sodium: 132 mmol/L — ABNORMAL LOW (ref 135–145)
Total Bilirubin: 0.5 mg/dL (ref 0.0–1.2)
Total Bilirubin: 0.5 mg/dL (ref 0.0–1.2)
Total Bilirubin: 0.5 mg/dL (ref 0.0–1.2)
Total Protein: 6.7 g/dL (ref 6.5–8.1)
Total Protein: 6.8 g/dL (ref 6.5–8.1)
Total Protein: 6.8 g/dL (ref 6.5–8.1)

## 2024-06-19 LAB — RENAL FUNCTION PANEL
Albumin: 2.5 g/dL — ABNORMAL LOW (ref 3.5–5.0)
Anion gap: 10 (ref 5–15)
BUN: 76 mg/dL — ABNORMAL HIGH (ref 8–23)
CO2: 28 mmol/L (ref 22–32)
Calcium: 7.9 mg/dL — ABNORMAL LOW (ref 8.9–10.3)
Chloride: 89 mmol/L — ABNORMAL LOW (ref 98–111)
Creatinine, Ser: 1.36 mg/dL — ABNORMAL HIGH (ref 0.61–1.24)
GFR, Estimated: 54 mL/min — ABNORMAL LOW
Glucose, Bld: 133 mg/dL — ABNORMAL HIGH (ref 70–99)
Phosphorus: 3.9 mg/dL (ref 2.5–4.6)
Potassium: 4.8 mmol/L (ref 3.5–5.1)
Sodium: 128 mmol/L — ABNORMAL LOW (ref 135–145)

## 2024-06-19 LAB — COOXEMETRY PANEL
Carboxyhemoglobin: 2.3 % — ABNORMAL HIGH (ref 0.5–1.5)
Carboxyhemoglobin: 1.4 % (ref 0.5–1.5)
Carboxyhemoglobin: 2 % — ABNORMAL HIGH (ref 0.5–1.5)
Methemoglobin: <0.7 % (ref 0.0–1.5)
Methemoglobin: 1.3 % (ref 0.0–1.5)
Methemoglobin: <0.7 % (ref 0.0–1.5)
O2 Saturation: 49.6 %
O2 Saturation: 54.3 %
O2 Saturation: 60.7 %
Total hemoglobin: 8.2 g/dL — ABNORMAL LOW (ref 12.0–16.0)
Total hemoglobin: 8.2 g/dL — ABNORMAL LOW (ref 12.0–16.0)
Total hemoglobin: 8.1 g/dL — ABNORMAL LOW (ref 12.0–16.0)

## 2024-06-19 LAB — HEPARIN LEVEL (UNFRACTIONATED): Heparin Unfractionated: >1.1 [IU]/mL — ABNORMAL HIGH (ref 0.30–0.70)

## 2024-06-19 LAB — CBC
HCT: 26.2 % — ABNORMAL LOW (ref 39.0–52.0)
Hemoglobin: 8.1 g/dL — ABNORMAL LOW (ref 13.0–17.0)
MCH: 23.8 pg — ABNORMAL LOW (ref 26.0–34.0)
MCHC: 30.9 g/dL (ref 30.0–36.0)
MCV: 76.8 fL — ABNORMAL LOW (ref 80.0–100.0)
Platelets: 286 K/uL (ref 150–400)
RBC: 3.41 MIL/uL — ABNORMAL LOW (ref 4.22–5.81)
RDW: 19.5 % — ABNORMAL HIGH (ref 11.5–15.5)
WBC: 27.2 K/uL — ABNORMAL HIGH (ref 4.0–10.5)
nRBC: 0.5 % — ABNORMAL HIGH (ref 0.0–0.2)

## 2024-06-19 LAB — CK: Total CK: 74 U/L (ref 49–397)

## 2024-06-19 LAB — APTT: aPTT: 73 s — ABNORMAL HIGH (ref 24–36)

## 2024-06-19 LAB — PRO BRAIN NATRIURETIC PEPTIDE: Pro Brain Natriuretic Peptide: 7678 pg/mL — ABNORMAL HIGH

## 2024-06-19 LAB — GLUCOSE, CAPILLARY
Glucose-Capillary: 130 mg/dL — ABNORMAL HIGH (ref 70–99)
Glucose-Capillary: 144 mg/dL — ABNORMAL HIGH (ref 70–99)
Glucose-Capillary: 154 mg/dL — ABNORMAL HIGH (ref 70–99)

## 2024-06-19 LAB — LEGIONELLA PNEUMOPHILA SEROGP 1 UR AG: L. pneumophila Serogp 1 Ur Ag: NEGATIVE

## 2024-06-19 MED ORDER — FUROSEMIDE 10 MG/ML IJ SOLN
80.0000 mg | Freq: Once | INTRAMUSCULAR | Status: AC
  Administered 2024-06-19: 80 mg via INTRAVENOUS
  Filled 2024-06-19: qty 8

## 2024-06-19 MED ORDER — LACTULOSE 10 GM/15ML PO SOLN
20.0000 g | Freq: Three times a day (TID) | ORAL | Status: DC
  Administered 2024-06-19: 20 g
  Filled 2024-06-19: qty 30

## 2024-06-19 MED ORDER — DEXTROSE 5 % IV SOLN
30.0000 mg/h | INTRAVENOUS | Status: DC
  Administered 2024-06-19 – 2024-06-20 (×2): 30 mg/h via INTRAVENOUS
  Filled 2024-06-19: qty 9
  Filled 2024-06-19 (×2): qty 450

## 2024-06-19 MED ORDER — AMIODARONE HCL IN DEXTROSE 360-4.14 MG/200ML-% IV SOLN: 30.0000 mg/h | INTRAVENOUS | Status: DC

## 2024-06-19 MED ORDER — DOBUTAMINE-DEXTROSE 4-5 MG/ML-% IV SOLN
5.0000 ug/kg/min | INTRAVENOUS | Status: DC
  Administered 2024-06-19: 2.5 ug/kg/min via INTRAVENOUS
  Administered 2024-06-21: 5 ug/kg/min via INTRAVENOUS
  Administered 2024-06-23: 3 ug/kg/min via INTRAVENOUS
  Filled 2024-06-19 (×5): qty 250

## 2024-06-19 NOTE — Progress Notes (Signed)
 Interval  Started on 2.5 dobut. ScVO2 49->54. No sig change in HR We have been able to wean NE. Currently at 4 mcg/min UOP > 1 liter since 80mg  lasix  Plan Will inc and hold dobutamine  at 5 mcg/kg/min Dec VPA to .03 Repeat ScVO2 30-45 min  Hold dex at 0.7 If no issues w/ above will re-attempt SBT.   Kyle Kelley Banner ACNP-BC [facility] Pulmonary/Critical Care Pager # [phone] OR # [phone] if no answer

## 2024-06-19 NOTE — Progress Notes (Signed)
 Extubated to heated high flow Hemodynamics stable Off VPA Still on NE at 2 mcg/min and DBA at 5mcg/kg/min  O2 currently 40 lpm at 40% sats mid 90s. RR 16 no accessory use Appears calm and comfortable   Weaning dex. Has already asked for water .  Plan Cont to wean dex to off Cont HHFNC for sats > 92 Wean NE for MAP > 65 Keep dba at 5 mcg  Maude FORBES Banner ACNP-BC [facility] Pulmonary/Critical Care Pager # [phone] OR # [phone] if no answer

## 2024-06-19 NOTE — Progress Notes (Signed)
 "  NAME:  Kyle Kelley, MRN:  [id number], DOB:  04-07-1946, LOS: 3 ADMISSION DATE:  06/16/2024, CONSULTATION DATE:  06/16/24 REFERRING MD:  EDP, CHIEF COMPLAINT:  acute hypoxic resp failure   History of Present Illness:  79 yo male was reportedly in normal state of health at rehab facility until yesterday afternoon/evening. Pt has complex medical history with frequent hospitalizations since October 2/2 PAD, GI bleed with perforated duodenal ulcer, wound dehiscence with wound vac placement and need for rehab/snf placement at d/c  .   All history is obtained from chart review and wife at bedside as pt is intubated and sedated.   Per wife pt was participating in rehab yesterday, walking, eating overall feeling well yesterday. In evening he did not eat much and went to bed. Unclear about events of today, per her however tonight he presented to [facility] via EMS after being found in resp distress. EMS found him unresponsive with diffuse wheezing. He was recently started on diuretics 2/2 increased edema since surgery. He was also in afib with rvr. Upon arrival to ed he was unresponsive and emergently intubated for hypoxia and airway protection. He was hypotensive unresponsive to fluids and started on levo escalated up to 25mcg at this time  Ccm asked to admit for resp failure and shock. Wife requests full code for now however expressed understanding of critical state, indicated would not want long term support.  Pertinent  Medical History  Afib on chronic a/c Pad with chronic LLE non healing wound Htn Hyperlipidemia Ao stenosis H/o cva H/o prostate cancer H/o LLE arterial occlusion with critical limb ischemia 10/25, s/p thrombectomy H/o GI bleed 2/2 duodenal ulcer 12/25 H/o perforated small bowel s/p Arlyss patch repair and lengthy TPN  Significant Hospital Events: Including procedures, antibiotic start and stop dates in addition to other pertinent events   Admitted to ICU 06/16/24 1/19 Intubated,  sedated, Afib RVR, on pressors  1/20 Pressor requirements improving, Afib 120s-130s, now following commands, still no BM- SMOG enema given. Stress dose steroids and flagyl  stopped 1/21 remains in A-fib but rate controlled.  Failed SBT.  Culture data still negative.  Added dobutamine   Interim History / Subjective:  Failed SBT.  He became rapidly tachypneic once tidal volumes titrated down to pressure support of less than 12 became restless and agitated Currently CVP 18-20 Norepinephrine  at 7 mics per minute, vasopressin  at 0.03 MAP within goal; Co. oximetry 49%, currently fluid balance greater than 5 L positive Dex at 0.3 24-hour Tmax at 99.9 Sodium 129-> 128 BUN 63->75 Creatinine 1.26-> 1.36 AST 565->667 ALT 623-> 730 Objective    Blood pressure 101/65, pulse 76, temperature 99.5 F (37.5 C), resp. rate 19, height 6' 1 (1.854 m), weight 75 kg, SpO2 99%. CVP:  [12 mmHg-27 mmHg] 17 mmHg  Vent Mode: PRVC FiO2 (%):  [40 %-50 %] 50 % Set Rate:  [16 bmp-18 bmp] 16 bmp Vt Set:  [440 mL-500 mL] 480 mL PEEP:  [5 cmH20-8 cmH20] 7 cmH20 Pressure Support:  [5 cmH20] 5 cmH20 Plateau Pressure:  [16 cmH20-21 cmH20] 17 cmH20   Intake/Output Summary (Last 24 hours) at 06/19/2024 0732 Last data filed at 06/19/2024 0700 Gross per 24 hour  Intake 2457.77 ml  Output 901 ml  Net 1556.77 ml   Filed Weights   06/16/24 2200 06/18/24 0800  Weight: 73.9 kg 75 kg    Examination: General this is a chronically ill-appearing 79 year old male patient who remains on full ventilatory support, requiring low-dose Precedex  for ventilator  compliance  HEENT temporal wasting orally intubated mucous membranes moist  Pulmonary: Crackles both bases, on full ventilator support tidal volume 480 respiratory rate 17, PEEP 7, FiO2 50% PEEP Plateau pressure 13, attempted pressure support trial.  Initially looked comfortable on pressure support of 15, once tidal volume titrated to less than 12 respiratory rate began to  increase, and rapidly became paradoxical, this is followed by agitation and restlessness with respiratory rate exceeding 35 breaths/min PXR 20th L>R airspace disease w/ some improved aeration  Cardiac irregular irregular: A-fib on telemetry Abdomen soft not tender tolerating tube feeds GU Foley catheter in place clear yellow urine Extremities he has cool extremities.  Both distal pulses requiring Doppler, the right DP via Doppler in the left PT via Doppler he does have some purple discolorations on the tip of his toes.  His left lower extremity wound dressing was broken down.  That site is well granulated and has no evidence of infection Neuro: Awakens to voice on minimal Precedex  dosing, follows commands, has generalized weakness but no focal deficits   Resolved problem list   Assessment and Plan  Acute hypoxic resp failure req mechanical ventilation Acute pneumonia HCAP vs aspiration but also with component of pulmonary edema from CHF MRSA negative Failed spontaneous breathing trial this a.m. Plan  Continue full ventilator support, with daily assessment for SBT and PSV trials, can reattempt pressure support later this afternoon if we can get some volume off PAD protocol with RASS goal -1 VAP bundle IV diuresis today, we need to achieve a negative volume status, I think this is the barrier to progress from a ventilator standpoint A.m. chest x-ray Antibiotics as below  Mixed Shock- Septic with Cardiogenic Shock w/ lactic acidosis  Cardiogenic component exacerbated by sepsis and AF w/ RVR  Elevated BNP- >8,000 & Echo obtained- 30-35%, LV moderately decreased, global hypokinesis, RV systolic function normal- RV/LV mildly dilated MV degenerative, Trivial MV regurg, severe mitral annular calcification, moderated calcification of aortic valve, no stenosis  I&O +5.1 liters -lactate cleared 1/20, culture data negative to date  Plan Day 4 antibiotics, changed to ceftriaxone  on 1/20, will plan  for 7-day course MAP goal > 65  NE at 7 mics per minute VPA at 0.04 IV Lasix  today Co-ox was low, adding dobutamine , then will repeat Co. ox and depending on response to dobutamine  wean vasopressin  to 0.03 CVP monitoring Cont tele  Rate control  Continue to hold antihypertensives   Afib RVR -hx of chronic afib on eliquis    Plan Continue amiodarone  Continue IV heparin   AKI: - Renal ultrasound on 1/19-negative for hydronephrosis. Plan Maximize mean arterial pressure, goal greater than 65 Renal dose medications Volume removal as tolerated Strict intake output Afternoon chemistry, as well as a.m. chemistry  Fluid and electrolyte imbalance: Non-hypotonic hyponatremia (on admit)-Baseline Na 130s Na trending down w/ + vol status  Plan IV Lasix  today Strict intake output Trend chemistries  Acute on Chronic Anemia Hx of GI bleed- 05/04/24-EGD showing duodenal ulcer Hx of perforated duodenal ulcer-taken to OR on 12/9 s/p ex-lap, graham patch repeair  - no evidence of overt bleeding - Hemoglobin stable since admission Plan  Continue to trend CBC daily Continue to monitor for signs of bleeding Transfuse for hemoglobin less than 7   Elevated LFTs- suspect related to shock state; Abd US  neg for stones or pericholecystic fluid, these have increased a little bit I think secondary to passive congestion from his heart failure Plan Trend LFTs Holding his statin  H/o  severe PAD w/ Recent LE DVT s/p Thrombectomy  Acute Ischemic Leg in 02/2024- s/p left tibioperoneal trunk endarterectomy with vein patch angioplasty, and stenting of left SFA- on plavix   Plan Continue Plavix  Continue sterile gauze dressing changes to the left lower extremity every shift  H/o HTN & HLD  plan Continue to hold antihypertensives for now in setting of vasopressors Continue to hold statin  Remote H/o ICH Plan Monitor MS on [facility]     I personally  spent 43 minutes  on this patient which included: review  of medical records, nursing notes, progress notes, evaluation, interpretation of lab data and diagnostic studies, taking independent history, performing exam, documenting plan, ordering diagnostics and interventions for the following critical care issues: Circulatory shock, Acute respiratory failure, Acute toxic and or metabolic encephalopathy, Acute renal failure, Severe metabolic derangements with the following interventions which included: titration of ventilatory support, titration of hemodynamic drips to desired MAP, titration of inotropic support, evaluation of complex metabolic derangements and ordering appropriate interventions and or consultations          "

## 2024-06-19 NOTE — Procedures (Addendum)
 Extubation Procedure Note  Patient Details:   Name: Kyle Kelley DOB: [birth year] MRN: [id number]   Airway Documentation:    Vent end date: 06/19/24 Vent end time: 1620   Evaluation  O2 sats: stable throughout Complications: No apparent complications Patient did tolerate procedure well. Bilateral Breath Sounds: Clear, Diminished    Patient was extubated to heated hi flow at 40 liters 70%. Patient had audible cuff leak prior to extubation patient had a productive cough will continue to monitor  Delon Goltz 06/19/2024, 4:33 PM

## 2024-06-19 NOTE — Progress Notes (Signed)
 Scvo2 reviewed this has Improved Placed on SBT.  VTs excellent. No accessory use. He is on dex at 0.7 Plan Extubate to heated high flow  Wean dex after extubation Cont pulse ox NPO

## 2024-06-19 NOTE — Progress Notes (Signed)
 ANTICOAGULATION CONSULT NOTE  Pharmacy Consult for heparin  Indication: Afib (Eliquis  PTA), CLI s/p thrombectomy/endarterectomy 02/2024 *hx ICH (cleared by [facility] for anticoagulation 02/2024), GIB 04/2024 s/p repair  Allergies[1]  Patient Measurements: Height: 6' 1 (185.4 cm) Weight: 75 kg (165 lb 5.5 oz) IBW/kg (Calculated) : 79.9 Heparin  Dosing Weight: 74 kg   Vital Signs: Temp: 99.5 F (37.5 C) (01/21 0800) Temp Source: Bladder (01/21 0800) BP: 100/69 (01/21 0800) Pulse Rate: 74 (01/21 0800)  Labs: Recent Labs    06/16/24 1915 06/16/24 1931 06/17/24 0456 06/17/24 0628 06/17/24 1146 06/17/24 1759 06/18/24 0305 06/18/24 1109 06/18/24 1552 06/18/24 2200 06/19/24 0402  HGB 9.3*   < > 9.5*   < > 10.9*  --  8.7*  --   --   --  8.1*  HCT 32.2*   < > 31.3*   < > 32.0*  --  29.1*  --   --   --  26.2*  PLT 334   < > 355  --   --   --  327  --   --   --  286  APTT  --   --   --   --   --    < > 70* 62*  --  82* 73*  LABPROT 29.7*  --   --   --   --   --   --   --   --   --   --   INR 2.7*  --   --   --   --   --   --   --   --   --   --   HEPARINUNFRC  --   --   --   --   --   --  >1.10*  --   --   --  >1.10*  CREATININE 1.44*   < > 1.22  --   --    < > 1.09  --  1.26*  --  1.36*  1.36*   < > = values in this interval not displayed.     Assessment: 38 yoM presents from SNF with respiratory failure now with newly reduced LVEF (30-35%), AF RVR, and possible HCAP vs aspiration PNA..  On Eliquis  PTA for Afib (last dose 1/18 AM).  Pharmacy consulted for heparin  dosing.  Per discussion with CCM 1/19, plan to defer bolus and pursue low-goal with hx of recent GIB, ICH and anemia at baseline.  Heparin  level > 1.1 remains falsely elevated from recent apixaban  use  - will use appt to dose heparin  until apixaban  clears. Aptt 73sec at goal on this morning on heparin  1300 units/hr.  No issues with line or bleeding reported per RN.   Hgb 8s   pltc WNL   Goal of Therapy:  Heparin   level 0.3-0.5 units/ml aPTT 66-85 seconds  Monitor platelets by anticoagulation protocol: Yes   Plan:  Continue heparin  infusion 1300 units/hr Daily CBC, anti-Xa level, aPTT until correlating Monitor for s/sx of bleeding    Olam Chalk Pharm.D. CPP, BCPS Clinical Pharmacist [phone] 06/19/2024 9:49 AM       [1]  Allergies Allergen Reactions   Ace Inhibitors Anaphylaxis   Bactrim  Ds [Sulfamethoxazole -Trimethoprim ] Shortness Of Breath   Rythmol [Propafenone] Shortness Of Breath   Nitrofurantoin Rash

## 2024-06-19 NOTE — TOC Initial Note (Signed)
 Transition of Care [facility]) - Initial/Assessment Note    Patient Details  Name: Kyle Kelley MRN: [id number] Date of Birth: [birth year]  [facility] CM/SW Contact:    Kyle Gell, RN Phone Number: 06/19/2024, 11:59 AM  Clinical Narrative:                  Patient admitted from [facility] after being found unresponsive, and in respiratory distress, intubated.  ICM will continue to follow as patient progresses.     Barriers to Discharge: Continued Medical Work up   Patient Goals and CMS Choice            Expected Discharge Plan and Services                                              Prior Living Arrangements/Services                       Activities of Daily Living      Permission Sought/Granted                  Emotional Assessment              Admission diagnosis:  Atrial fibrillation with RVR (HCC) [I48.91] Septic shock (HCC) [A41.9, R65.21] Acute hypoxemic respiratory failure (HCC) [J96.01] Acute hypoxic respiratory failure (HCC) [J96.01] Patient Active Problem List   Diagnosis Date Noted   Acute hypoxemic respiratory failure (HCC) 06/16/2024   Protein-calorie malnutrition, severe 05/14/2024   Symptomatic anemia 05/03/2024   Critical limb ischemia of left lower extremity (HCC) 03/26/2024   Critical lower limb ischemia (HCC) 03/26/2024   History of stroke without residual deficits 03/25/2024   Mixed hyperlipidemia 03/25/2024   Status post surgery 03/25/2024   ICH (intracerebral hemorrhage) (HCC) 02/28/2024   Aortic stenosis    Carotid artery stenosis    Dizziness 09/02/2020   Tobacco abuse 09/02/2020   Parotid mass 09/02/2020   Atrial fibrillation (HCC)    Malignant neoplasm of prostate (HCC) 09/05/2016   Essential hypertension, benign 05/08/2013   PCP:  Kyle Kelley LABOR, MD Pharmacy:   [facility] [phone] - [place], [place] - [address] AT [facility] [address] [place] [address] Phone: [phone] Fax: [phone]  [facility] [address] [place] [address] Phone: [phone] Fax: [phone]  [facility] [address] [place] [address] Phone: [phone] Fax: [phone]     Social Drivers of Health (SDOH) Social History: SDOH Screenings   Food Insecurity: No Food Insecurity (06/17/2024)  Housing: Low Risk (06/17/2024)  Transportation Needs: No Transportation Needs (06/17/2024)  Utilities: Not At Risk (06/17/2024)  Social Connections: Moderately Integrated (06/17/2024)  Tobacco Use: Medium Risk (06/06/2024)   SDOH Interventions:     Readmission Risk Interventions    05/06/2024   12:43 PM  Readmission Risk Prevention Plan  Transportation Screening Complete  PCP or Specialist Appt within 3-5 Days Complete  HRI or Home Care Consult Complete  Social Work Consult for Recovery Care Planning/Counseling Complete  Palliative Care Screening Complete  Medication Review Oceanographer) Referral to Pharmacy

## 2024-06-20 ENCOUNTER — Inpatient Hospital Stay (HOSPITAL_COMMUNITY)

## 2024-06-20 DIAGNOSIS — J9601 Acute respiratory failure with hypoxia: Secondary | ICD-10-CM | POA: Diagnosis not present

## 2024-06-20 DIAGNOSIS — R579 Shock, unspecified: Secondary | ICD-10-CM | POA: Diagnosis not present

## 2024-06-20 DIAGNOSIS — I4891 Unspecified atrial fibrillation: Secondary | ICD-10-CM | POA: Diagnosis not present

## 2024-06-20 DIAGNOSIS — J189 Pneumonia, unspecified organism: Secondary | ICD-10-CM | POA: Diagnosis not present

## 2024-06-20 DIAGNOSIS — E878 Other disorders of electrolyte and fluid balance, not elsewhere classified: Secondary | ICD-10-CM | POA: Diagnosis not present

## 2024-06-20 DIAGNOSIS — D649 Anemia, unspecified: Secondary | ICD-10-CM | POA: Diagnosis not present

## 2024-06-20 DIAGNOSIS — R7401 Elevation of levels of liver transaminase levels: Secondary | ICD-10-CM | POA: Diagnosis not present

## 2024-06-20 DIAGNOSIS — J810 Acute pulmonary edema: Secondary | ICD-10-CM | POA: Diagnosis not present

## 2024-06-20 DIAGNOSIS — N179 Acute kidney failure, unspecified: Secondary | ICD-10-CM | POA: Diagnosis not present

## 2024-06-20 LAB — COMPREHENSIVE METABOLIC PANEL WITH GFR
ALT: 559 U/L — ABNORMAL HIGH (ref 0–44)
ALT: 444 U/L — ABNORMAL HIGH (ref 0–44)
AST: 370 U/L — ABNORMAL HIGH (ref 15–41)
AST: 230 U/L — ABNORMAL HIGH (ref 15–41)
Albumin: 2.3 g/dL — ABNORMAL LOW (ref 3.5–5.0)
Albumin: 2.2 g/dL — ABNORMAL LOW (ref 3.5–5.0)
Alkaline Phosphatase: 108 U/L (ref 38–126)
Alkaline Phosphatase: 105 U/L (ref 38–126)
Anion gap: 12 (ref 5–15)
Anion gap: 8 (ref 5–15)
BUN: 68 mg/dL — ABNORMAL HIGH (ref 8–23)
BUN: 58 mg/dL — ABNORMAL HIGH (ref 8–23)
CO2: 32 mmol/L (ref 22–32)
CO2: 33 mmol/L — ABNORMAL HIGH (ref 22–32)
Calcium: 7.7 mg/dL — ABNORMAL LOW (ref 8.9–10.3)
Calcium: 7.7 mg/dL — ABNORMAL LOW (ref 8.9–10.3)
Chloride: 93 mmol/L — ABNORMAL LOW (ref 98–111)
Chloride: 93 mmol/L — ABNORMAL LOW (ref 98–111)
Creatinine, Ser: 1.19 mg/dL (ref 0.61–1.24)
Creatinine, Ser: 1.1 mg/dL (ref 0.61–1.24)
GFR, Estimated: >60 mL/min
GFR, Estimated: >60 mL/min
Glucose, Bld: 89 mg/dL (ref 70–99)
Glucose, Bld: 180 mg/dL — ABNORMAL HIGH (ref 70–99)
Potassium: 3.5 mmol/L (ref 3.5–5.1)
Potassium: 3.6 mmol/L (ref 3.5–5.1)
Sodium: 136 mmol/L (ref 135–145)
Sodium: 135 mmol/L (ref 135–145)
Total Bilirubin: 0.5 mg/dL (ref 0.0–1.2)
Total Bilirubin: 0.5 mg/dL (ref 0.0–1.2)
Total Protein: 6.5 g/dL (ref 6.5–8.1)
Total Protein: 6.3 g/dL — ABNORMAL LOW (ref 6.5–8.1)

## 2024-06-20 LAB — CBC
HCT: 25.7 % — ABNORMAL LOW (ref 39.0–52.0)
Hemoglobin: 7.9 g/dL — ABNORMAL LOW (ref 13.0–17.0)
MCH: 23.3 pg — ABNORMAL LOW (ref 26.0–34.0)
MCHC: 30.7 g/dL (ref 30.0–36.0)
MCV: 75.8 fL — ABNORMAL LOW (ref 80.0–100.0)
Platelets: 247 K/uL (ref 150–400)
RBC: 3.39 MIL/uL — ABNORMAL LOW (ref 4.22–5.81)
RDW: 19.6 % — ABNORMAL HIGH (ref 11.5–15.5)
WBC: 20.8 K/uL — ABNORMAL HIGH (ref 4.0–10.5)
nRBC: 0.2 % (ref 0.0–0.2)

## 2024-06-20 LAB — BLOOD CULTURE ID PANEL (REFLEXED) - BCID2

## 2024-06-20 LAB — GLUCOSE, CAPILLARY
Glucose-Capillary: 112 mg/dL — ABNORMAL HIGH (ref 70–99)
Glucose-Capillary: 131 mg/dL — ABNORMAL HIGH (ref 70–99)
Glucose-Capillary: 156 mg/dL — ABNORMAL HIGH (ref 70–99)
Glucose-Capillary: 160 mg/dL — ABNORMAL HIGH (ref 70–99)
Glucose-Capillary: 79 mg/dL (ref 70–99)
Glucose-Capillary: 89 mg/dL (ref 70–99)
Glucose-Capillary: 91 mg/dL (ref 70–99)
Glucose-Capillary: 91 mg/dL (ref 70–99)
Glucose-Capillary: 99 mg/dL (ref 70–99)

## 2024-06-20 LAB — COOXEMETRY PANEL
Carboxyhemoglobin: 1.7 % — ABNORMAL HIGH (ref 0.5–1.5)
Carboxyhemoglobin: 1.9 % — ABNORMAL HIGH (ref 0.5–1.5)
Methemoglobin: <0.7 % (ref 0.0–1.5)
Methemoglobin: <0.7 % (ref 0.0–1.5)
O2 Saturation: 72.9 %
O2 Saturation: 72.2 %
Total hemoglobin: 8.2 g/dL — ABNORMAL LOW (ref 12.0–16.0)
Total hemoglobin: 8.3 g/dL — ABNORMAL LOW (ref 12.0–16.0)

## 2024-06-20 LAB — APTT: aPTT: 57 s — ABNORMAL HIGH (ref 24–36)

## 2024-06-20 LAB — HEPARIN LEVEL (UNFRACTIONATED): Heparin Unfractionated: 1.1 [IU]/mL — ABNORMAL HIGH (ref 0.30–0.70)

## 2024-06-20 LAB — PRO BRAIN NATRIURETIC PEPTIDE: Pro Brain Natriuretic Peptide: 6984 pg/mL — ABNORMAL HIGH

## 2024-06-20 MED ORDER — SENNOSIDES-DOCUSATE SODIUM 8.6-50 MG PO TABS
2.0000 | ORAL_TABLET | Freq: Every day | ORAL | Status: DC
  Administered 2024-06-20: 2 via ORAL
  Filled 2024-06-20 (×5): qty 2

## 2024-06-20 MED ORDER — LACTULOSE 10 GM/15ML PO SOLN
20.0000 g | Freq: Three times a day (TID) | ORAL | Status: DC
  Filled 2024-06-20: qty 30

## 2024-06-20 MED ORDER — ADULT MULTIVITAMIN W/MINERALS CH
1.0000 | ORAL_TABLET | Freq: Every day | ORAL | Status: DC
  Administered 2024-06-20 – 2024-07-01 (×12): 1 via ORAL
  Filled 2024-06-20 (×11): qty 1

## 2024-06-20 MED ORDER — CLOPIDOGREL BISULFATE 75 MG PO TABS
75.0000 mg | ORAL_TABLET | Freq: Every day | ORAL | Status: DC
  Administered 2024-06-21 – 2024-07-01 (×11): 75 mg via ORAL
  Filled 2024-06-20 (×11): qty 1

## 2024-06-20 MED ORDER — ZINC SULFATE 220 (50 ZN) MG PO CAPS
220.0000 mg | ORAL_CAPSULE | Freq: Every day | ORAL | Status: DC
  Administered 2024-06-20 – 2024-07-01 (×12): 220 mg via ORAL
  Filled 2024-06-20 (×11): qty 1

## 2024-06-20 MED ORDER — POLYETHYLENE GLYCOL 3350 17 G PO PACK
17.0000 g | PACK | Freq: Two times a day (BID) | ORAL | Status: DC
  Filled 2024-06-20 (×8): qty 1

## 2024-06-20 MED ORDER — VITAMIN C 500 MG PO TABS
500.0000 mg | ORAL_TABLET | Freq: Every day | ORAL | Status: DC
  Administered 2024-06-20 – 2024-07-01 (×12): 500 mg via ORAL
  Filled 2024-06-20 (×11): qty 1

## 2024-06-20 MED ORDER — FAMOTIDINE 20 MG PO TABS
20.0000 mg | ORAL_TABLET | Freq: Every day | ORAL | Status: DC
  Administered 2024-06-20 – 2024-06-30 (×11): 20 mg via ORAL
  Filled 2024-06-20 (×11): qty 1

## 2024-06-20 MED ORDER — ENSURE PLUS HIGH PROTEIN PO LIQD
237.0000 mL | Freq: Three times a day (TID) | ORAL | Status: DC
  Administered 2024-06-20 – 2024-06-25 (×12): 237 mL via ORAL

## 2024-06-20 MED ORDER — THIAMINE MONONITRATE 100 MG PO TABS
100.0000 mg | ORAL_TABLET | Freq: Every day | ORAL | Status: AC
  Administered 2024-06-21 – 2024-06-24 (×4): 100 mg via ORAL
  Filled 2024-06-20 (×4): qty 1

## 2024-06-20 NOTE — Progress Notes (Signed)
 PHARMACY - PHYSICIAN COMMUNICATION CRITICAL VALUE ALERT - BLOOD CULTURE IDENTIFICATION (BCID)  Kyle Kelley is an 79 y.o. male who presented to [facility] on 06/16/2024 with a chief complaint of respiratory distress  1/18 Blood>>1/4 bottles with GPC, no organism detected on BCID  Name of physician (or Provider) Contacted: L. Adolph PA-C  Current antibiotics: Ceftriaxone   Changes to prescribed antibiotics recommended:  No changes  Results for orders placed or performed during the hospital encounter of 06/16/24  Blood Culture ID Panel (Reflexed) (Collected: 06/16/2024  7:36 PM)  Result Value Ref Range   Enterococcus faecalis NOT DETECTED NOT DETECTED   Enterococcus Faecium NOT DETECTED NOT DETECTED   Listeria monocytogenes NOT DETECTED NOT DETECTED   Staphylococcus species NOT DETECTED NOT DETECTED   Staphylococcus aureus (BCID) NOT DETECTED NOT DETECTED   Staphylococcus epidermidis NOT DETECTED NOT DETECTED   Staphylococcus lugdunensis NOT DETECTED NOT DETECTED   Streptococcus species NOT DETECTED NOT DETECTED   Streptococcus agalactiae NOT DETECTED NOT DETECTED   Streptococcus pneumoniae NOT DETECTED NOT DETECTED   Streptococcus pyogenes NOT DETECTED NOT DETECTED   A.calcoaceticus-baumannii NOT DETECTED NOT DETECTED   Bacteroides fragilis NOT DETECTED NOT DETECTED   Enterobacterales NOT DETECTED NOT DETECTED   Enterobacter cloacae complex NOT DETECTED NOT DETECTED   Escherichia coli NOT DETECTED NOT DETECTED   Klebsiella aerogenes NOT DETECTED NOT DETECTED   Klebsiella oxytoca NOT DETECTED NOT DETECTED   Klebsiella pneumoniae NOT DETECTED NOT DETECTED   Proteus species NOT DETECTED NOT DETECTED   Salmonella species NOT DETECTED NOT DETECTED   Serratia marcescens NOT DETECTED NOT DETECTED   Haemophilus influenzae NOT DETECTED NOT DETECTED   Neisseria meningitidis NOT DETECTED NOT DETECTED   Pseudomonas aeruginosa NOT DETECTED NOT DETECTED   Stenotrophomonas maltophilia NOT  DETECTED NOT DETECTED   Candida albicans NOT DETECTED NOT DETECTED   Candida auris NOT DETECTED NOT DETECTED   Candida glabrata NOT DETECTED NOT DETECTED   Candida krusei NOT DETECTED NOT DETECTED   Candida parapsilosis NOT DETECTED NOT DETECTED   Candida tropicalis NOT DETECTED NOT DETECTED   Cryptococcus neoformans/gattii NOT DETECTED NOT DETECTED    Giovonni, Poirier 06/20/2024  11:13 PM

## 2024-06-20 NOTE — Progress Notes (Signed)
 ANTICOAGULATION CONSULT NOTE  Pharmacy Consult for heparin  Indication: Afib (Eliquis  PTA), CLI s/p thrombectomy/endarterectomy 02/2024 *hx ICH (cleared by [facility] for anticoagulation 02/2024), GIB 04/2024 s/p repair  Allergies[1]  Patient Measurements: Height: 6' 1 (185.4 cm) Weight: 75 kg (165 lb 5.5 oz) IBW/kg (Calculated) : 79.9 Heparin  Dosing Weight: 74 kg   Vital Signs: Temp: 98.4 F (36.9 C) (01/22 1400) Temp Source: Bladder (01/22 0400) BP: 100/53 (01/22 1500) Pulse Rate: 123 (01/22 1500)  Labs: Recent Labs    06/18/24 0305 06/18/24 1109 06/18/24 2200 06/19/24 0402 06/19/24 1016 06/19/24 1633 06/20/24 0346 06/20/24 0507  HGB 8.7*  --   --  8.1*  --   --  7.9*  --   HCT 29.1*  --   --  26.2*  --   --  25.7*  --   PLT 327  --   --  286  --   --  247  --   APTT 70*   < > 82* 73*  --   --   --  57*  HEPARINUNFRC >1.10*  --   --  >1.10*  --   --   --  1.10*  CREATININE 1.09   < >  --  1.36*  1.36* 1.35* 1.30* 1.19  --   CKTOTAL  --   --   --   --  74  --   --   --    < > = values in this interval not displayed.     Assessment: 89 yoM presents from SNF with respiratory failure now with newly reduced LVEF (30-35%), AF RVR, and possible HCAP vs aspiration PNA..  On Eliquis  PTA for Afib (last dose 1/18 AM).  Pharmacy consulted for heparin  dosing.  Per discussion with CCM 1/19, plan to defer bolus and pursue low-goal with hx of recent GIB, ICH and anemia at baseline.  Heparin  level > 1.1 remains falsely elevated from recent apixaban  use  - will use appt to dose heparin  until apixaban  clears. Aptt fell ths am from 73sec to 56sec < goal on same heparin  drip 1300 units/hr.  No issues with line or bleeding reported per RN.   Hgb 8s   pltc WNL   Goal of Therapy:  Heparin  level 0.3-0.5 units/ml aPTT 66-85 seconds  Monitor platelets by anticoagulation protocol: Yes   Plan:  Increase  heparin  infusion 1400 units/hr Daily CBC, anti-Xa level, aPTT until  correlating Monitor for s/sx of bleeding    Olam Chalk Pharm.D. CPP, BCPS Clinical Pharmacist [phone] 06/20/2024 3:44 PM        [1]  Allergies Allergen Reactions   Ace Inhibitors Anaphylaxis   Bactrim  Ds [Sulfamethoxazole -Trimethoprim ] Shortness Of Breath   Rythmol [Propafenone] Shortness Of Breath   Nitrofurantoin Rash

## 2024-06-20 NOTE — Progress Notes (Signed)
 Nutrition Follow-up  DOCUMENTATION CODES:   Severe malnutrition in context of chronic illness  INTERVENTION:    Add Magic cup TID with meals, each supplement provides 290 kcal and 9 grams of protein  Ensure Plus High Protein po TID between meals, each supplement provides 350 kcal and 20 grams of protein  Add MVI with Minerals daily Continue Thiamine  100 mg daily x 7 days  Add Vitamin C  500 mg daily x 30 days and Zinc  Sulfate 220 mg daily x 14 days for wound healing  Add Daily Weights  NUTRITION DIAGNOSIS:   Severe Malnutrition related to chronic illness as evidenced by severe muscle depletion, severe fat depletion, edema.  Continues but being addressed via diet advancement  GOAL:   Patient will meet greater than or equal to 90% of their needs  Progressing   MONITOR:   Vent status, Labs, TF tolerance, Weight trends, I & O's  REASON FOR ASSESSMENT:   Consult Enteral/tube feeding initiation and management  ASSESSMENT:   79 yo male admitted with acute respiratory failure with pneumonia (aspiration vs HCAP) and CHF requiring intubation, +shock (mixed: cardiogenic and septic). Pt with recent hx of GI bleed with  perforated ulcer requiring graham patch repair and TPN for nutrition in December of 2025, acute left leg ischemia requiring stent in October 2025. PMH includes HTN, prostate cancer, anemia, malnutrition, PVD/PAD  1/18 Admitted, Intubated 1/19 Vital High Protein started at 10 ml/hr, US : suggesting cirrhosis, perihepatic ascites, GB wall thickening 1/20 Changed to Vital 1.5 at 10 ml/hr 1/21 Extubated, TF discontinued  Dobutamine  at 5, Levophed  at 6  Diet advanced to Puree today with Thins, SLP also consulted-eval pending. No po intake yet  Received lasix  yesterday; UOP 3.9 L plus 1 unmeasured urine occurrence in 24 hours  No weight since 1/20, recommend daily weights  +BM this AM, noted 2 BMs documented overnight  Labs: Sodium 136 (wdl) Potassium 3.5  (wdl) BUN 68 Creatinine wdl Elevated LFTs, T.Bili wdl CBGs 79-154  Meds: Ss novolog  Lactulose  20 g TID   Diet Order:   Diet Order             DIET - DYS 1 Room service appropriate? Yes; Fluid consistency: Thin  Diet effective now                   EDUCATION NEEDS:   Education needs have been addressed  Skin:  Skin Assessment: Skin Integrity Issues: Skin Integrity Issues:: Unstageable Unstageable: sacrum (image noted in chart)  Last BM:  1/22 +type 4 medium  Height:   Ht Readings from Last 1 Encounters:  06/16/24 6' 1 (1.854 m)    Weight:   Wt Readings from Last 1 Encounters:  06/18/24 75 kg    BMI:  Body mass index is 21.81 kg/m.  Estimated Nutritional Needs:   Kcal:  2200-2400 kcals  Protein:  125-155 g  Fluid:  1.8 L   Betsey Finger MS, RDN, LDN, CNSC Registered Dietitian 3 Clinical Nutrition RD Inpatient Contact Info in Amion

## 2024-06-20 NOTE — Evaluation (Signed)
 Physical Therapy Evaluation Patient Details Name: Kyle Kelley MRN: [id number] DOB: [birth year] Today's Date: 06/20/2024  History of Present Illness  79 yo male presents from Rehab Facility 06/16/2024 for respiratory distress intubated on arrival.  Severe L-sided aspiration PNA and resultant septic shock.  PMHx: atrial fibrillation (on Eliquis /recently held), vasculopath, acute on chronic ischemic LLE nonhealing wound, HTN, HLD, aortic stenosis, CVA, prostate cancer s/p prostatectomy 2014, and PVD.  Clinical Impression  Pt discharged from [facility] 05/18/24 to Rehab Facility and at that time he was generally contact guard for mobility. Pt originally from home with wife where he was independent. Pt is currently limited in safe mobility with increased O2 demand (currently HHFNC 40L 60%FiO2) in presence of decreased strength, balance and endurance. Plan for transfer to recliner for lunch. Utilized bed egress position and pt need modAx2 for coming to standing and taking lateral steps to recliner. Pt with ongoing incontinence of stool. PT/RN decide to return to bed for ease of cleaning. Pt left in seated egress position with wife and RN to eat lunch prior to return to supine. PT recommends return to continued inpatient follow up therapy, <3 hours/day. PT will continue to follow acutely.          If plan is discharge home, recommend the following: A lot of help with walking and/or transfers;A lot of help with bathing/dressing/bathroom;Assistance with cooking/housework;Direct supervision/assist for medications management;Direct supervision/assist for financial management;Assist for transportation;Help with stairs or ramp for entrance;Supervision due to cognitive status   Can travel by private vehicle   No    Equipment Recommendations Hospital bed;Hoyer lift;Wheelchair (measurements PT);Wheelchair cushion (measurements PT)     Functional Status Assessment Patient has had a recent decline in their functional  status and demonstrates the ability to make significant improvements in function in a reasonable and predictable amount of time.     Precautions / Restrictions Precautions Precautions: Fall Precaution/Restrictions Comments: HHFNC Restrictions Weight Bearing Restrictions Per Provider Order: No      Mobility  Bed Mobility Overal bed mobility: Needs Assistance             General bed mobility comments: utilized bed egress to come to standing    Transfers Overall transfer level: Needs assistance Equipment used: Rolling walker (2 wheels) Transfers: Sit to/from Stand, Bed to chair/wheelchair/BSC Sit to Stand: Mod assist, +2 safety/equipment          Lateral/Scoot Transfers: Mod assist, +2 safety/equipment General transfer comment: plan to get up to chair for lunch, modAx2 for power up and steadying in RW cues for upright posture, pt able to take lateral steps to L to stand in front of recliner, pt with active BM, pt able to stand for pericare but continuous BM, PT/RN decide to take lateral step to R back to in front of bed, pt sat back on bed/chair with decreased eccentric control    Ambulation/Gait               General Gait Details: deferred        Balance Overall balance assessment: Needs assistance Sitting-balance support: Single extremity supported, Bilateral upper extremity supported, Feet supported Sitting balance-Leahy Scale: Poor Sitting balance - Comments: requires posterior support Postural control: Posterior lean Standing balance support: During functional activity, Reliant on assistive device for balance, Bilateral upper extremity supported Standing balance-Leahy Scale: Zero Standing balance comment: requires mod outside assist to maintain standing balance  Pertinent Vitals/Pain Pain Assessment Pain Assessment: Faces Faces Pain Scale: Hurts little more Pain Location: generalized with movement, sacral  wound Pain Descriptors / Indicators: Grimacing, Guarding, Moaning Pain Intervention(s): Limited activity within patient's tolerance, Monitored during session, Repositioned    Home Living Family/patient expects to be discharged to:: Private residence Living Arrangements: Spouse/significant other Available Help at Discharge: Family;Available 24 hours/day (wife) Type of Home: House Home Access: Stairs to enter Entrance Stairs-Rails: Left Entrance Stairs-Number of Steps: 3 (into kitchen from back door)   Home Layout: One level Home Equipment: Agricultural Consultant (2 wheels);Cane - single point;Grab bars - tub/shower;Hand held shower head;Grab bars - toilet      Prior Function Prior Level of Function : Independent/Modified Independent             Mobility Comments: indep, recently progressed from using RW to no AD, denies falls hx in past 6 months (prior to going to Rehab) ADLs Comments: Pt reports sponge baths secondary to LLE wound. Previously able to get into the shower, holidng onto the bars to step in, and bath himself. Gets dressed sitting/standing. Wears briefs at home d/t worry of incontinence. Manages his own medicaitons. Hasn't driven since the stroke. (prior to going to rehab)     Extremity/Trunk Assessment   Upper Extremity Assessment Upper Extremity Assessment: Defer to OT evaluation    Lower Extremity Assessment Lower Extremity Assessment: Generalized weakness;LLE deficits/detail;RLE deficits/detail RLE Deficits / Details: increased R LE ankle swelling LLE Deficits / Details: L LE non healing wound    Cervical / Trunk Assessment Cervical / Trunk Assessment: Kyphotic  Communication   Communication Communication: Impaired Factors Affecting Communication: Hearing impaired    Cognition Arousal: Lethargic Behavior During Therapy: WFL for tasks assessed/performed   PT - Cognitive impairments: Difficult to assess Difficult to assess due to:  (HHFNC)                      PT - Cognition Comments: limited communication Following commands: Intact       Cueing Cueing Techniques: Verbal cues, Gestural cues, Tactile cues     General Comments General comments (skin integrity, edema, etc.): pt on 40L HHFNC, 60%FiO2, Afib HR in 130s with mobilization,        Assessment/Plan    PT Assessment Patient needs continued PT services  PT Problem List Decreased strength;Decreased activity tolerance;Decreased balance;Decreased mobility;Pain;Decreased cognition       PT Treatment Interventions DME instruction;Gait training;Stair training;Functional mobility training;Therapeutic activities;Therapeutic exercise;Balance training;Cognitive remediation;Patient/family education    PT Goals (Current goals can be found in the Care Plan section)  Acute Rehab PT Goals PT Goal Formulation: With patient/family Time For Goal Achievement: 07/04/24 Potential to Achieve Goals: Fair    Frequency Min 2X/week        AM-PAC PT 6 Clicks Mobility  Outcome Measure Help needed turning from your back to your side while in a flat bed without using bedrails?: A Lot Help needed moving from lying on your back to sitting on the side of a flat bed without using bedrails?: Total Help needed moving to and from a bed to a chair (including a wheelchair)?: Total Help needed standing up from a chair using your arms (e.g., wheelchair or bedside chair)?: Total Help needed to walk in hospital room?: Total Help needed climbing 3-5 steps with a railing? : Total 6 Click Score: 7    End of Session Equipment Utilized During Treatment: Gait belt;Oxygen Activity Tolerance: Other (comment);Patient limited by fatigue (continuous stooling  in standing) Patient left: Other (comment);with nursing/sitter in room;with family/visitor present (sitting in bed egress position to eat lunch prior to return to supine.) Nurse Communication: Mobility status PT Visit Diagnosis: Other abnormalities of gait  and mobility (R26.89);Unsteadiness on feet (R26.81);Muscle weakness (generalized) (M62.81);Difficulty in walking, not elsewhere classified (R26.2);Adult, failure to thrive (R62.7);Pain Pain - part of body:  (sacrum)    Time: 8761-8677 PT Time Calculation (min) (ACUTE ONLY): 44 min   Charges:   PT Evaluation $PT Eval Moderate Complexity: 1 Mod   PT General Charges $$ ACUTE PT VISIT: 1 Visit         Prem Coykendall B. [name] Lapidus PT, DPT Acute Rehabilitation Services Please use secure chat or  Call Office [phone]   Almarie KATHEE [name] [facility] 06/20/2024, 1:44 PM

## 2024-06-20 NOTE — Progress Notes (Signed)
 "  NAME:  Kyle Kelley, MRN:  [id number], DOB:  January 22, 1946, LOS: 4 ADMISSION DATE:  06/16/2024, CONSULTATION DATE:  06/16/24 REFERRING MD:  EDP, CHIEF COMPLAINT:  acute hypoxic resp failure   History of Present Illness:  79 yo male was reportedly in normal state of health at rehab facility until yesterday afternoon/evening. Pt has complex medical history with frequent hospitalizations since October 2/2 PAD, GI bleed with perforated duodenal ulcer, wound dehiscence with wound vac placement and need for rehab/snf placement at d/c  .   All history is obtained from chart review and wife at bedside as pt is intubated and sedated.   Per wife pt was participating in rehab yesterday, walking, eating overall feeling well yesterday. In evening he did not eat much and went to bed. Unclear about events of today, per her however tonight he presented to [facility] via EMS after being found in resp distress. EMS found him unresponsive with diffuse wheezing. He was recently started on diuretics 2/2 increased edema since surgery. He was also in afib with rvr. Upon arrival to ed he was unresponsive and emergently intubated for hypoxia and airway protection. He was hypotensive unresponsive to fluids and started on levo escalated up to 25mcg at this time  Ccm asked to admit for resp failure and shock. Wife requests full code for now however expressed understanding of critical state, indicated would not want long term support.  Pertinent  Medical History  Afib on chronic a/c Pad with chronic LLE non healing wound Htn Hyperlipidemia Ao stenosis H/o cva H/o prostate cancer H/o LLE arterial occlusion with critical limb ischemia 10/25, s/p thrombectomy H/o GI bleed 2/2 duodenal ulcer 12/25 H/o perforated small bowel s/p Arlyss patch repair and lengthy TPN  Significant Hospital Events: Including procedures, antibiotic start and stop dates in addition to other pertinent events   Admitted to ICU 06/16/24 1/19 Intubated,  sedated, Afib RVR, on pressors  1/20 Pressor requirements improving, Afib 120s-130s, now following commands, still no BM- SMOG enema given. Stress dose steroids and flagyl  stopped 1/21 remains in A-fib but rate controlled.  Failed SBT.  Culture data still negative.  Added dobutamine  1/22 awake with stable respiratory status post extubation  Interim History / Subjective:  On high flow nasal cannula postextubation, awake and conversational Remains on nor epi 6 mcg and dobutamine  5 with CoOx 72%  Objective    Blood pressure (!) 101/59, pulse (!) 118, temperature 98.4 F (36.9 C), resp. rate 19, height 6' 1 (1.854 m), weight 75 kg, SpO2 100%. CVP:  [4 mmHg-28 mmHg] 10 mmHg  Vent Mode: PRVC FiO2 (%):  [40 %-70 %] 60 % Set Rate:  [16 bmp] 16 bmp Vt Set:  [480 mL] 480 mL PEEP:  [5 cmH20] 5 cmH20 Plateau Pressure:  [13 cmH20] 13 cmH20   Intake/Output Summary (Last 24 hours) at 06/20/2024 0920 Last data filed at 06/20/2024 0800 Gross per 24 hour  Intake 1384.57 ml  Output 3815 ml  Net -2430.43 ml   Filed Weights   06/16/24 2200 06/18/24 0800  Weight: 73.9 kg 75 kg    General: Chronically ill-appearing male sitting up in bed on high flow nasal cannula in no distress HEENT: MM pink/moist, sclera anicteric Neuro:, Alert and oriented and moving all extremities CV: s1s2 regular, tachycardic, no m/r/g PULM: Clear bilaterally, mildly tachypneic without severe distress GI: soft, nontender Extremities: warm/dry, no edema  Skin: no rashes or lesions   Resolved problem list   Assessment and Plan   Acute  hypoxic resp failure secondary to secondary to HCAP vs aspiration concurrent with a component of pulmonary edema from CHF MRSA negative -Respiratory cultures and viral panels negative - Extubated successfully to high flow nasal cannula - Encourage mobilization with PT/OT along with I-S and flutter valve - Continue ceftriaxone  for 7-day course  Mixed Shock- Septic with Cardiogenic  Shock Echo obtained- 30-35%, LV moderately decreased, global hypokinesis, RV systolic function normal- RV/LV mildly dilated  -Coox was 49% yesterday, started on dobutamine  5 -CVP 8-10 today, hold diuresis while on norepinephrine  -MAP goal > 65  -Coox improved, wean Dobutamine  to 2.5, repeat coox this evening -consider repeat echo tomorrow as septic physiology continues to improve   Afib RVR -hx of chronic afib on eliquis    -Continue amiodarone  and  IV heparin   AKI:  -Renal ultrasound on 1/19-negative for hydronephrosis. -Maximize mean arterial pressure, goal greater than 65 -Renal dose medications -Volume removal as tolerated -Strict intake output -Afternoon chemistry, as well as a.m. chemistry  Fluid and electrolyte imbalance:  - Sinew to monitor volume status and metabolic derangements, NA improved  Acute on Chronic Anemia Hx of GI bleed- 05/04/24-EGD showing duodenal ulcer Hx of perforated duodenal ulcer-taken to OR on 12/9 s/p ex-lap, graham patch repeair  - no evidence of overt bleeding - Hemoglobin stable since admission -Continue to trend CBC daily and monitor for signs of bleeding -Transfuse for hemoglobin less than 7   Elevated LFTs Suspect shock liver and congestive hepatopathy -LFTs downtrending -Holding his statin  H/o severe PAD w/ Recent LE DVT s/p Thrombectomy  Acute Ischemic Leg in 02/2024- s/p left tibioperoneal trunk endarterectomy with vein patch angioplasty, and stenting of left SFA- on plavix   -Continue Plavix  -Continue sterile gauze dressing changes to the left lower extremity every shift  H/o HTN & HLD  -Continue to hold antihypertensives for now in setting of vasopressors and statin due to LFTs   Remote H/o ICH -Monitor MS on [facility]   CRITICAL CARE Performed by: Leita SAUNDERS Darric Plante   Total critical care time: 40 minutes  Critical care time was exclusive of separately billable procedures and treating other patients.  Critical care was necessary  to treat or prevent imminent or life-threatening deterioration.  Critical care was time spent personally by me on the following activities: development of treatment plan with patient and/or surrogate as well as nursing, discussions with consultants, evaluation of patient's response to treatment, examination of patient, obtaining history from patient or surrogate, ordering and performing treatments and interventions, ordering and review of laboratory studies, ordering and review of radiographic studies, pulse oximetry and re-evaluation of patient's condition.    Leita SAUNDERS Tristen Luce, PA-C [facility] See Amion for pager If no response to pager , please call [phone] until 7pm After 7:00 pm call Elink  [phone]   "

## 2024-06-20 NOTE — TOC Progression Note (Addendum)
 Transition of Care [facility]) - Progression Note    Patient Details  Name: Kyle Kelley MRN: [id number] Date of Birth: [birth year]  [facility] of [facility] CM/SW Contact  Isaiah Public, LCSWA Phone Number: 06/20/2024, 3:52 PM  Clinical Narrative:     CSW received consult for possible SNF placement at time of discharge. CSW spoke with patient at bedside regarding PT recommendation of SNF placement at time of discharge. Patient reports PTA he comes from [facility] short term.Patient expressed understanding of PT recommendation and is agreeable to SNF placement at time of discharge. Patient reports preference for [facility] as first choice. Patient gave CSW permission to fax out initial referral for SNF placement. CSW explained to patient will continue to follow and will fax patient out for SNF closer to patient being medically ready for dc. CSW discussed insurance authorization process. All questions answered. No further questions reported at this time.CSW to continue to follow and assist with discharge planning needs.   Expected Discharge Plan: Skilled Nursing Facility Barriers to Discharge: Continued Medical Work up               Expected Discharge Plan and Services In-house Referral: Clinical Social Work     Living arrangements for the past 2 months: Single Family Home                                       Social Drivers of Health (SDOH) Interventions SDOH Screenings   Food Insecurity: No Food Insecurity (06/17/2024)  Housing: Low Risk (06/17/2024)  Transportation Needs: No Transportation Needs (06/17/2024)  Utilities: Not At Risk (06/17/2024)  Social Connections: Moderately Integrated (06/17/2024)  Tobacco Use: Medium Risk (06/06/2024)    Readmission Risk Interventions    05/06/2024   12:43 PM  Readmission Risk Prevention Plan  Transportation Screening Complete  PCP or Specialist Appt within 3-5 Days Complete  HRI or Home Care Consult Complete  Social Work Consult  for Recovery Care Planning/Counseling Complete  Palliative Care Screening Complete  Medication Review Oceanographer) Referral to Pharmacy

## 2024-06-21 ENCOUNTER — Inpatient Hospital Stay (HOSPITAL_COMMUNITY)

## 2024-06-21 DIAGNOSIS — J9601 Acute respiratory failure with hypoxia: Secondary | ICD-10-CM | POA: Diagnosis not present

## 2024-06-21 DIAGNOSIS — I509 Heart failure, unspecified: Secondary | ICD-10-CM | POA: Diagnosis not present

## 2024-06-21 LAB — COOXEMETRY PANEL
Carboxyhemoglobin: 1.3 % (ref 0.5–1.5)
Carboxyhemoglobin: 2 % — ABNORMAL HIGH (ref 0.5–1.5)
Methemoglobin: 0.7 % (ref 0.0–1.5)
Methemoglobin: <0.7 % (ref 0.0–1.5)
O2 Saturation: 47.3 %
O2 Saturation: 67 %
Total hemoglobin: 8.1 g/dL — ABNORMAL LOW (ref 12.0–16.0)
Total hemoglobin: 7.8 g/dL — ABNORMAL LOW (ref 12.0–16.0)

## 2024-06-21 LAB — PRO BRAIN NATRIURETIC PEPTIDE: Pro Brain Natriuretic Peptide: 4584 pg/mL — ABNORMAL HIGH

## 2024-06-21 LAB — ECHOCARDIOGRAM LIMITED
AR max vel: 1.51 cm2
AV Area VTI: 1.31 cm2
AV Area mean vel: 1.53 cm2
AV Mean grad: 10 mmHg
AV Peak grad: 19.2 mmHg
Ao pk vel: 2.19 m/s
Calc EF: 43 %
Height: 73 in
S' Lateral: 4.1 cm
Single Plane A2C EF: 43.6 %
Single Plane A4C EF: 44.2 %
Weight: 2684.32 [oz_av]

## 2024-06-21 LAB — GLUCOSE, CAPILLARY
Glucose-Capillary: 99 mg/dL (ref 70–99)
Glucose-Capillary: 105 mg/dL — ABNORMAL HIGH (ref 70–99)
Glucose-Capillary: 159 mg/dL — ABNORMAL HIGH (ref 70–99)
Glucose-Capillary: 132 mg/dL — ABNORMAL HIGH (ref 70–99)
Glucose-Capillary: 160 mg/dL — ABNORMAL HIGH (ref 70–99)
Glucose-Capillary: 72 mg/dL (ref 70–99)

## 2024-06-21 LAB — BODY FLUID CELL COUNT WITH DIFFERENTIAL
Eos, Fluid: 0 %
Lymphs, Fluid: 2 %
Monocyte-Macrophage-Serous Fluid: 13 % — ABNORMAL LOW (ref 50–90)
Neutrophil Count, Fluid: 85 % — ABNORMAL HIGH (ref 0–25)
Total Nucleated Cell Count, Fluid: 2276 uL — ABNORMAL HIGH (ref 0–1000)

## 2024-06-21 LAB — CBC
HCT: 25.3 % — ABNORMAL LOW (ref 39.0–52.0)
Hemoglobin: 7.5 g/dL — ABNORMAL LOW (ref 13.0–17.0)
MCH: 22.9 pg — ABNORMAL LOW (ref 26.0–34.0)
MCHC: 29.6 g/dL — ABNORMAL LOW (ref 30.0–36.0)
MCV: 77.4 fL — ABNORMAL LOW (ref 80.0–100.0)
Platelets: 255 K/uL (ref 150–400)
RBC: 3.27 MIL/uL — ABNORMAL LOW (ref 4.22–5.81)
RDW: 19.5 % — ABNORMAL HIGH (ref 11.5–15.5)
WBC: 16.1 K/uL — ABNORMAL HIGH (ref 4.0–10.5)
nRBC: 0.2 % (ref 0.0–0.2)

## 2024-06-21 LAB — MAGNESIUM: Magnesium: 2.4 mg/dL (ref 1.7–2.4)

## 2024-06-21 LAB — BASIC METABOLIC PANEL WITH GFR
Anion gap: 10 (ref 5–15)
BUN: 48 mg/dL — ABNORMAL HIGH (ref 8–23)
CO2: 33 mmol/L — ABNORMAL HIGH (ref 22–32)
Calcium: 7.6 mg/dL — ABNORMAL LOW (ref 8.9–10.3)
Chloride: 96 mmol/L — ABNORMAL LOW (ref 98–111)
Creatinine, Ser: 0.85 mg/dL (ref 0.61–1.24)
GFR, Estimated: >60 mL/min
Glucose, Bld: 98 mg/dL (ref 70–99)
Potassium: 3.3 mmol/L — ABNORMAL LOW (ref 3.5–5.1)
Sodium: 139 mmol/L (ref 135–145)

## 2024-06-21 LAB — APTT: aPTT: 72 s — ABNORMAL HIGH (ref 24–36)

## 2024-06-21 LAB — HEPARIN LEVEL (UNFRACTIONATED): Heparin Unfractionated: 0.68 [IU]/mL (ref 0.30–0.70)

## 2024-06-21 MED ORDER — AMIODARONE IV BOLUS ONLY 150 MG/100ML
150.0000 mg | Freq: Once | INTRAVENOUS | Status: AC
  Administered 2024-06-21: 150 mg via INTRAVENOUS

## 2024-06-21 MED ORDER — AMIODARONE HCL IN DEXTROSE 360-4.14 MG/200ML-% IV SOLN
60.0000 mg/h | INTRAVENOUS | Status: DC
  Administered 2024-06-21 – 2024-06-24 (×12): 60 mg/h via INTRAVENOUS
  Administered 2024-06-24: 30 mg/h via INTRAVENOUS
  Administered 2024-06-24: 60 mg/h via INTRAVENOUS
  Administered 2024-06-25 – 2024-06-26 (×3): 30 mg/h via INTRAVENOUS
  Filled 2024-06-21 (×3): qty 200
  Filled 2024-06-21: qty 400
  Filled 2024-06-21: qty 200
  Filled 2024-06-21: qty 400
  Filled 2024-06-21 (×3): qty 200
  Filled 2024-06-21: qty 400
  Filled 2024-06-21: qty 200
  Filled 2024-06-21 (×2): qty 400

## 2024-06-21 MED ORDER — FUROSEMIDE 10 MG/ML IJ SOLN
80.0000 mg | Freq: Once | INTRAMUSCULAR | Status: AC
  Administered 2024-06-21: 80 mg via INTRAVENOUS
  Filled 2024-06-21: qty 8

## 2024-06-21 MED ORDER — POTASSIUM CHLORIDE CRYS ER 20 MEQ PO TBCR
60.0000 meq | EXTENDED_RELEASE_TABLET | Freq: Once | ORAL | Status: AC
  Administered 2024-06-21: 60 meq via ORAL
  Filled 2024-06-21: qty 3

## 2024-06-21 NOTE — Procedures (Signed)
 Thoracentesis  Procedure Note  Kyle Kelley  [id number]  08-05-1945  Date:06/21/24  Time:9:16 PM   Provider Performing:Pete E Jenna   Procedure: Thoracentesis with imaging guidance ([id number])  Indication(s) Pleural Effusion  Consent Risks of the procedure as well as the alternatives and risks of each were explained to the patient and/or caregiver.  Consent for the procedure was obtained and is signed in the bedside chart  Anesthesia Topical only with 1% lidocaine     Time Out Verified patient identification, verified procedure, site/side was marked, verified correct patient position, special equipment/implants available, medications/allergies/relevant history reviewed, required imaging and test results available.   Sterile Technique Maximal sterile technique including full sterile barrier drape, hand hygiene, sterile gown, sterile gloves, mask, hair covering, sterile ultrasound probe cover (if used).  Procedure Description Ultrasound was used to identify appropriate pleural anatomy for placement and overlying skin marked.  Area of drainage cleaned and draped in sterile fashion. Lidocaine  was used to anesthetize the skin and subcutaneous tissue.  1200 cc's of cloudy yellow  appearing fluid was drained from the left pleural space. Catheter then removed and bandaid applied to site.   Complications/Tolerance None; patient tolerated the procedure well. Chest X-ray is ordered to confirm no post-procedural complication.   EBL Minimal   Specimen(s) Pleural fluid

## 2024-06-21 NOTE — Evaluation (Signed)
 Clinical/Bedside Swallow Evaluation Patient Details  Name: Kyle Kelley MRN: [id number] Date of Birth: 07-Dec-1945  Today's Date: 06/21/2024 Time: SLP Start Time (ACUTE ONLY): 1042 SLP Stop Time (ACUTE ONLY): 1055 SLP Time Calculation (min) (ACUTE ONLY): 13 min  Past Medical History:  Past Medical History:  Diagnosis Date   Aortic stenosis    Moderate AS with mean AVG 10.8, V-max 2.88m/s, SVI 28, DI 0.3   Carotid artery stenosis    1-39% bilaterally by dopplers 11/2022   Dyslipidemia    Hypertension    Inguinal hernia 2010   Small asymptomatic L inguinal Hernia   Peripheral vascular disease    Permanent atrial fibrillation (HCC)    atrial fib    Pneumonia    hx of    Prostate cancer (HCC) 07/2012   s/p prostatectomy   PSA elevation    workup per Dr. Alm Fragmin, March 2014   Sigmoid diverticulosis    on colonoscopy in 2010   Skin cancer 02/2012   Dr Renay Homer   Stroke [facility])    Tobacco dependence    rarely smokes-3 cigs per week   Past Surgical History:  Past Surgical History:  Procedure Laterality Date   APPLICATION OF WOUND VAC Left 05/03/2024   Procedure: APPLICATION, LEFT LEG WOUND VAC;  Surgeon: Sheree Penne Bruckner, MD;  Location: [facility] OR;  Service: Vascular;  Laterality: Left;   APPLICATION, SKIN SUBSTITUTE Left 05/03/2024   Procedure: APPLICATION, SKIN SUBSTITUTE KERECIS 38 SQ CM;  Surgeon: Sheree Penne Bruckner, MD;  Location: [facility] OR;  Service: Vascular;  Laterality: Left;   CARDIOVERSION  08/05/2005   Direct Current Cardioversion   COLONOSCOPY  2010   Direct current Cardioversion  08/05/2005   ENDARTERECTOMY POPLITEAL  03/25/2024   Procedure: ENDARTERECTOMY, LEFT TP TRUNK WITH PATCH ANGIOPLASTY;  Surgeon: Sheree Penne Bruckner, MD;  Location: [facility] OR;  Service: Vascular;;   ESOPHAGOGASTRODUODENOSCOPY N/A 05/04/2024   Procedure: EGD (ESOPHAGOGASTRODUODENOSCOPY);  Surgeon: Elicia Claw, MD;  Location: [facility] ENDOSCOPY;  Service: Gastroenterology;   Laterality: N/A;   INCISION AND DRAINAGE OF WOUND Left 05/03/2024   Procedure: IRRIGATION AND DEBRIDEMENT WOUND, LEFT LOWER EXTREMITY;  Surgeon: Sheree Penne Bruckner, MD;  Location: [facility] OR;  Service: Vascular;  Laterality: Left;   LAPAROTOMY N/A 05/07/2024   Procedure: LAPAROTOMY, EXPLORATORY;  Surgeon: Vernetta Berg, MD;  Location: [facility] OR;  Service: General;  Laterality: N/A;   LOWER EXTREMITY ANGIOGRAM Left 03/25/2024   Procedure: GERALYN, LOWER EXTREMITY;  Surgeon: Sheree Penne Bruckner, MD;  Location: [facility] OR;  Service: Vascular;  Laterality: Left;   PROSTATE BIOPSY  09/06/2012   Dr. Alm Fragmin   ROBOT ASSISTED LAPAROSCOPIC RADICAL PROSTATECTOMY N/A 10/31/2012   Procedure: ROBOTIC ASSISTED LAPAROSCOPIC RADICAL PROSTATECTOMY;  Surgeon: Alm GORMAN Fragmin, MD;  Location: [facility] ORS;  Service: Urology;  Laterality: N/A;   THROMBECTOMY FEMORAL ARTERY Left 03/25/2024   Procedure: THROMBECTOMY, LEFT FEMORAL TO BELOW KNEE ARTERY;  Surgeon: Sheree Penne Bruckner, MD;  Location[facility] OR;  Service: Vascular;  Laterality: Left;   TONSILLECTOMY AND ADENOIDECTOMY     TONSILLECTOMY AND ADENOIDECTOMY     VEIN HARVEST Left 03/25/2024   Procedure: SURGICAL PROCUREMENT, LEFT LOWER SAPHENOUS VEIN;  Surgeon: Sheree Penne Bruckner, MD;  Location: [facility] OR;  Service: Vascular;  Laterality: Left;   HPI:  Kyle Kelley is a 79 yo male admitted from [facility] with acute hypoxemic respiratory failure. He was intubated on arrival on 1/18 and extubated on 1/21. Pt received on HFNC 35L. SLP consulted for clinical  swallow assessment. No hx of dysphagia per pt report or chart review. PmHx significant for  frequent hospitalizations since October due to PAD, GI bleed with perforated duodenal ulcer, wound dehiscence with wound vac placement and need for rehab/snf placement at d/c  .    Assessment / Plan / Recommendation  Clinical Impression   Pt presents with a grossly functional oropharyngeal swallow per  clinical swallow assessment completed today; however, Kyle Kelley is at an elevated risk of aspiration due to HFNC needs. Oral prep and transit prompt with complete oral clearance. Pharyngeal swallow initiation appeared timely with laryngeal elevation noted. No overt or subtle s/s of aspiration noted across consistencies.   Recommend pt advance to a mechanical soft diet and thin liquids as tolerated. PO meds as tolerated.  SLP will follow up to assess diet tolerance and modify diet as indicated. Consider instrumental swallow assessment if concerns for aspiration are noted with PO intake.   SLP Visit Diagnosis:  (monitor for aspiration risk)    Aspiration Risk  Mild aspiration risk    Diet Recommendation Dysphagia 3 (Mech soft);Thin liquid    Liquid Administration via: Cup;Straw Medication Administration: Whole meds with liquid Supervision: Patient able to self feed;Intermittent supervision to cue for compensatory strategies Compensations: Slow rate;Small sips/bites Postural Changes: Seated upright at 90 degrees    Other Recommendations Oral Care Recommendations: Oral care BID      Functional Status Assessment Patient has had a recent decline in their functional status and demonstrates the ability to make significant improvements in function in a reasonable and predictable amount of time.  Frequency and Duration min 1 x/week  2 weeks       Prognosis Prognosis for improved oropharyngeal function: Good      Swallow Study   General Date of Onset: 06/21/24 HPI: Kyle Kelley is a 80 yo male admitted from [facility] with acute hypoxemic respiratory failure. He was intubated on arrival on 1/18 and extubated on 1/21. Pt received on HFNC 35L. SLP consulted for clinical swallow assessment. No hx of dysphagia per pt report or chart review. PmHx significant for  frequent hospitalizations since October due to PAD, GI bleed with perforated duodenal ulcer, wound dehiscence with wound vac  placement and need for rehab/snf placement at d/c  . Type of Study: Bedside Swallow Evaluation Previous Swallow Assessment: none per chart Diet Prior to this Study: Dysphagia 1 (pureed);Thin liquids (Level 0) Temperature Spikes Noted: No Respiratory Status: Nasal cannula (35L) History of Recent Intubation: Yes Total duration of intubation (days): 3 days Date extubated: 06/19/24 Behavior/Cognition: Alert;Cooperative;Pleasant mood Oral Cavity Assessment: Within Functional Limits Oral Cavity - Dentition: Adequate natural dentition Vision: Functional for self-feeding Self-Feeding Abilities: Able to feed self Patient Positioning: Upright in chair Baseline Vocal Quality: Low vocal intensity Volitional Cough: Strong Volitional Swallow: Able to elicit    Oral/Motor/Sensory Function Overall Oral Motor/Sensory Function: Within functional limits   Ice Chips Ice chips: Not tested   Thin Liquid Thin Liquid: Within functional limits Presentation: Self Fed;Straw    Nectar Thick Nectar Thick Liquid: Not tested   Honey Thick Honey Thick Liquid: Not tested   Puree Puree: Within functional limits Presentation: Self Fed   Solid     Solid: Within functional limits Presentation: Self Fed      Peyton JINNY Rummer 06/21/2024,1:30 PM

## 2024-06-21 NOTE — Progress Notes (Signed)
 2D echo attempted, patient getting meds. Will try later when in bed

## 2024-06-21 NOTE — Plan of Care (Signed)
" °  Problem: SLP Dysphagia Goals Goal: Misc Dysphagia Goal Flowsheets (Taken 06/21/2024 1306) Misc Dysphagia Goal: Pt will tolerate the least restrictive diet with no overt or subtle s/s of aspriation or decline in pulmonary status.   "

## 2024-06-21 NOTE — Progress Notes (Signed)
 ANTICOAGULATION CONSULT NOTE  Pharmacy Consult for heparin  Indication: Afib (Eliquis  PTA), CLI s/p thrombectomy/endarterectomy 02/2024 *hx ICH (cleared by [facility] for anticoagulation 02/2024), GIB 04/2024 s/p repair  Allergies[1]  Patient Measurements: Height: 6' 1 (185.4 cm) Weight: 76.1 kg (167 lb 12.3 oz) IBW/kg (Calculated) : 79.9 Heparin  Dosing Weight: 74 kg   Vital Signs: Temp: 97.7 F (36.5 C) (01/23 0900) Temp Source: Bladder (01/23 0400) BP: 96/59 (01/23 1015) Pulse Rate: 119 (01/23 1015)  Labs: Recent Labs    06/19/24 0402 06/19/24 1016 06/19/24 1633 06/20/24 0346 06/20/24 0507 06/20/24 1652 06/21/24 0336  HGB 8.1*  --   --  7.9*  --   --  7.5*  HCT 26.2*  --   --  25.7*  --   --  25.3*  PLT 286  --   --  247  --   --  255  APTT 73*  --   --   --  57*  --  72*  HEPARINUNFRC >1.10*  --   --   --  1.10*  --  0.68  CREATININE 1.36*  1.36* 1.35* 1.30* 1.19  --  1.10  --   CKTOTAL  --  74  --   --   --   --   --      Assessment: 66 yoM presents from SNF with respiratory failure now with newly reduced LVEF (30-35%), AF RVR, and possible HCAP vs aspiration PNA..  On Eliquis  PTA for Afib (last dose 1/18 AM).  Pharmacy consulted for heparin  dosing.  Per discussion with CCM 1/19, plan to defer bolus and pursue low-goal with hx of recent GIB, ICH and anemia at baseline.  aPTT therapeutic, heparin  level remains altered by DOAC use. CBC low but stable.  Goal of Therapy:  Heparin  level 0.3-0.5 units/ml aPTT 66-85 seconds  Monitor platelets by anticoagulation protocol: Yes   Plan:  Continue heparin  1400 units/h Daily aPTT, heparin  level, CBC  Kyle Kelley, PharmD, BCPS, [facility] Clinical Pharmacist [phone] Please check AMION for all [facility] Pharmacy numbers 06/21/2024         [1]  Allergies Allergen Reactions   Ace Inhibitors Anaphylaxis   Bactrim  Ds [Sulfamethoxazole -Trimethoprim ] Shortness Of Breath   Rythmol [Propafenone] Shortness Of Breath    Nitrofurantoin Rash

## 2024-06-21 NOTE — Progress Notes (Signed)
 "  NAME:  Kyle Kelley, MRN:  [id number], DOB:  11-Dec-1945, LOS: 5 ADMISSION DATE:  06/16/2024, CONSULTATION DATE:  06/16/24 REFERRING MD:  EDP, CHIEF COMPLAINT:  acute hypoxic resp failure   History of Present Illness:  79 yo male was reportedly in normal state of health at rehab facility until yesterday afternoon/evening. Pt has complex medical history with frequent hospitalizations since October 2/2 PAD, GI bleed with perforated duodenal ulcer, wound dehiscence with wound vac placement and need for rehab/snf placement at d/c  .   All history is obtained from chart review and wife at bedside as pt is intubated and sedated.   Per wife pt was participating in rehab yesterday, walking, eating overall feeling well yesterday. In evening he did not eat much and went to bed. Unclear about events of today, per her however tonight he presented to [facility] via EMS after being found in resp distress. EMS found him unresponsive with diffuse wheezing. He was recently started on diuretics 2/2 increased edema since surgery. He was also in afib with rvr. Upon arrival to ed he was unresponsive and emergently intubated for hypoxia and airway protection. He was hypotensive unresponsive to fluids and started on levo escalated up to 25mcg at this time  Ccm asked to admit for resp failure and shock. Wife requests full code for now however expressed understanding of critical state, indicated would not want long term support.  Pertinent  Medical History  Afib on chronic a/c Pad with chronic LLE non healing wound Htn Hyperlipidemia Ao stenosis H/o cva H/o prostate cancer H/o LLE arterial occlusion with critical limb ischemia 10/25, s/p thrombectomy H/o GI bleed 2/2 duodenal ulcer 12/25 H/o perforated small bowel s/p Arlyss patch repair and lengthy TPN  Significant Hospital Events: Including procedures, antibiotic start and stop dates in addition to other pertinent events   Admitted to ICU 06/16/24 1/19 Intubated,  sedated, Afib RVR, on pressors  1/20 Pressor requirements improving, Afib 120s-130s, now following commands, still no BM- SMOG enema given. Stress dose steroids and flagyl  stopped 1/21 remains in A-fib but rate controlled.  Failed SBT.  Culture data still negative.  Added dobutamine  1/22 awake with stable respiratory status post extubation 1/23 remains on dobutamine  and HFNC, off pressors  Interim History / Subjective:  No acute overnight events, remains on dobutamine  2.5 and Co. ox decreased to 47% this morning, dobutamine  back to 5, urine output 1.8 L yesterday CVP 8 Remains on 35L 60% HFNC   Objective    Blood pressure (!) 99/59, pulse (!) 111, temperature (!) 97.5 F (36.4 C), resp. rate (!) 25, height 6' 1 (1.854 m), weight 76.1 kg, SpO2 97%. CVP:  [4 mmHg-28 mmHg] 6 mmHg  FiO2 (%):  [60 %-63 %] 61 %   Intake/Output Summary (Last 24 hours) at 06/21/2024 9076 Last data filed at 06/21/2024 0700 Gross per 24 hour  Intake 914.57 ml  Output 1324 ml  Net -409.43 ml   Filed Weights   06/18/24 0800 06/21/24 0340 06/21/24 0412  Weight: 75 kg 76.1 kg 76.1 kg    General: Chronically ill-appearing male sitting up in bed on high flow nasal cannula in no distress HEENT: MM pink/moist, sclera anicteric Neuro:, Alert and oriented and moving all extremities CV: s1s2 regular, tachycardic, no m/r/g PULM: Clear bilaterally, on nasal cannula without distress GI: soft, nontender Extremities: warm/dry, RLE with 2+ pitting edema, dressing in place     Resolved problem list   Assessment and Plan   Acute hypoxic resp  failure secondary to secondary to HCAP vs aspiration concurrent with a component of pulmonary edema from CHF MRSA negative -Respiratory cultures and viral panels negative - Extubated successfully to high flow nasal cannula - Encourage mobilization with PT/OT along with I-S and flutter valve - Continue ceftriaxone  for 7-day course  Mixed Shock- Septic with Cardiogenic  component  Echo obtained- 30-35%, LV moderately decreased, global hypokinesis, RV systolic function normal- RV/LV mildly dilated  -Coox was stable at 72% yesterday after decreasing dobutamine  to 2.33mcg, however has dropped to 47% this AM off norepi with stable MAP -consult heart failure, appreciate recs -repeat limited echo -CVP 8, held diuresis yesterday while on norepi, will defer to heart failure  -MAP goal > 65     Afib RVR -hx of chronic afib on eliquis    -Continue amiodarone  and  IV heparin   AKI:  Creatinine improved, BMP pending today -Renal ultrasound on 1/19-negative for hydronephrosis. -Maximize mean arterial pressure, goal greater than 65 -Renal dose medications -Strict intake output    Acute on Chronic Anemia Hx of GI bleed- 05/04/24-EGD showing duodenal ulcer Hx of perforated duodenal ulcer-taken to OR on 12/9 s/p ex-lap, graham patch repeair  - no evidence of overt bleeding - Hemoglobin stable since admission -Continue to trend CBC daily and monitor for signs of bleeding -Transfuse for hemoglobin less than 7   Elevated LFTs Suspect shock liver and congestive hepatopathy -LFTs down-trending, intermittently trend  -Holding his statin  H/o severe PAD w/ Recent LE DVT s/p Thrombectomy  Acute Ischemic Leg in 02/2024- s/p left tibioperoneal trunk endarterectomy with vein patch angioplasty, and stenting of left SFA- on plavix   -Continue Plavix  -Continue sterile gauze dressing changes to the left lower extremity every shift  H/o HTN & HLD  -Continue to hold antihypertensives for now in setting of vasopressors and statin due to LFTs   Remote H/o ICH -Monitor MS on [facility]     Nutrition -Passed swallow eval, dysphagia diet ordered  CRITICAL CARE Performed by: Leita SAUNDERS Jenay Morici   Total critical care time: 38 minutes  Critical care time was exclusive of separately billable procedures and treating other patients.  Critical care was necessary to treat or prevent  imminent or life-threatening deterioration.  Critical care was time spent personally by me on the following activities: development of treatment plan with patient and/or surrogate as well as nursing, discussions with consultants, evaluation of patient's response to treatment, examination of patient, obtaining history from patient or surrogate, ordering and performing treatments and interventions, ordering and review of laboratory studies, ordering and review of radiographic studies, pulse oximetry and re-evaluation of patient's condition.    Leita SAUNDERS Avilene Marrin, PA-C [facility] See Amion for pager If no response to pager , please call [phone] until 7pm   "

## 2024-06-21 NOTE — Progress Notes (Signed)
 2D echo attempted again, patient needs patient care. Staff in room

## 2024-06-21 NOTE — Progress Notes (Signed)
 2D echo attempted, patient in chair. RN request around 1pm.

## 2024-06-21 NOTE — Progress Notes (Signed)
" °  Echocardiogram 2D Echocardiogram has been performed.  Kyle Kelley 06/21/2024, 2:36 PM "

## 2024-06-21 NOTE — Consult Note (Addendum)
 "   Advanced Heart Failure Team Consult Note   Primary Physician: Charlott Dorn LABOR, MD Cardiologist:  Wilbert Bihari, MD HPI:    Kyle Kelley is seen today for evaluation of cardiogenic shock at the request of Kyle Kelley.    Kyle Kelley is a 79 y.o. male with a history of prostate cancer, CVA, multiple hospitalizations secondary to PAD, GI bleed perforated duodenal ulcer, wound dehiscence requiring skilled placement. Echo 2025 55-60% in 2025.   Saw general cardiology 06/06/2024. BP 96/52   Over the last few months his wife has noticed increased shortness of breath + lower extremity edema. He has been at [facility] for rehab since 05/20/24. PT at SNF limited by fatigue and dyspnea.   Presented to [facility] ED via EMS with acute respiratory distress due to suspected aspiration. On arrival EKG A fib RVR and unresponsive requiring urgent intubation. Hypotensive with poor response to IV fluids. Started on Norepi and admitted to [facility]. Norepi gradually weaned off with addition of DBA 5 mcg. Echo LVEF down to 30-35%. . Extubated 06/19/24.  Remains on HFNC 35 liters.     CO-OX stable. DBA weaned to 2.5 mcg. CO-OX down to 47%  Cardiac studies reviewed: Echo 05/2024 LVEF 30-35% RV normal.  Echo 2025 LVEF  55-60%   Home Medications Prior to Admission medications  Medication Sig Start Date End Date Taking? Authorizing Provider  apixaban  (ELIQUIS ) 5 MG TABS tablet Take 5 mg by mouth 2 (two) times daily.   Yes [provider]  clopidogrel  (PLAVIX ) 75 MG tablet Take 1 tablet (75 mg total) by mouth daily. 05/19/24  Yes Kelley, Costin M, MD  docusate sodium  (COLACE) 100 MG capsule Take 100 mg by mouth daily.   Yes [provider]  EPINEPHrine  (EPIPEN  IJ) Inject 1 application  as directed as needed (anaphylaxis).   Yes [provider]  metolazone (ZAROXOLYN) 2.5 MG tablet Take 2.5 mg by mouth 3 (three) times a week. Monday, Wednesday, Friday   Yes [provider]  metoprolol   succinate (TOPROL -XL) 25 MG 24 hr tablet Take 1 tablet (25 mg total) by mouth daily. 12/25/23  Yes Turner, Wilbert SAUNDERS, MD  pantoprazole  (PROTONIX ) 40 MG tablet Take 1 tablet (40 mg total) by mouth 2 (two) times daily. 05/19/24  Yes Kyle Nilda HERO, MD  Potassium Chloride  ER 20 MEQ TBCR Take by mouth. Patient taking differently: Take 40 mEq by mouth daily. Dose raised to 40 mEq due to the start of metolazone   Yes [provider]  torsemide (DEMADEX) 20 MG tablet Take 20 mg by mouth daily.   Yes [provider]  diltiazem  (TIADYLT  ER) 300 MG 24 hr capsule TAKE 1 CAPSULE BY MOUTH EVERY DAY FOR 90 DAYS; Duration: 90 Patient not taking: Reported on 06/16/2024    [provider]  rosuvastatin  (CRESTOR ) 10 MG tablet Take 1 tablet (10 mg total) by mouth daily. Patient not taking: Reported on 06/16/2024 12/25/23   Bihari Wilbert SAUNDERS, MD    Past Medical History: Past Medical History:  Diagnosis Date   Aortic stenosis    Moderate AS with mean AVG 10.8, V-max 2.36m/s, SVI 28, DI 0.3   Carotid artery stenosis    1-39% bilaterally by dopplers 11/2022   Dyslipidemia    Hypertension    Inguinal hernia 2010   Small asymptomatic L inguinal Hernia   Peripheral vascular disease    Permanent atrial fibrillation (HCC)    atrial fib    Pneumonia  hx of    Prostate cancer (HCC) 07/2012   s/p prostatectomy   PSA elevation    workup per Kyle. Alm Kelley, March 2014   Sigmoid diverticulosis    on colonoscopy in 2010   Skin cancer 02/2012   Kyle Renay Homer   Stroke [facility])    Tobacco dependence    rarely smokes-3 cigs per week    Past Surgical History: Past Surgical History:  Procedure Laterality Date   APPLICATION OF WOUND VAC Left 05/03/2024   Procedure: APPLICATION, LEFT LEG WOUND VAC;  Surgeon: Sheree Penne Bruckner, MD;  Location: [facility] OR;  Service: Vascular;  Laterality: Left;   APPLICATION, SKIN SUBSTITUTE Left 05/03/2024   Procedure: APPLICATION, SKIN SUBSTITUTE KERECIS 38 SQ  CM;  Surgeon: Sheree Penne Bruckner, MD;  Location: [facility] OR;  Service: Vascular;  Laterality: Left;   CARDIOVERSION  08/05/2005   Direct Current Cardioversion   COLONOSCOPY  2010   Direct current Cardioversion  08/05/2005   ENDARTERECTOMY POPLITEAL  03/25/2024   Procedure: ENDARTERECTOMY, LEFT TP TRUNK WITH PATCH ANGIOPLASTY;  Surgeon: Sheree Penne Bruckner, MD;  Location: [facility] OR;  Service: Vascular;;   ESOPHAGOGASTRODUODENOSCOPY N/A 05/04/2024   Procedure: EGD (ESOPHAGOGASTRODUODENOSCOPY);  Surgeon: Elicia Claw, MD;  Location: [facility] ENDOSCOPY;  Service: Gastroenterology;  Laterality: N/A;   INCISION AND DRAINAGE OF WOUND Left 05/03/2024   Procedure: IRRIGATION AND DEBRIDEMENT WOUND, LEFT LOWER EXTREMITY;  Surgeon: Sheree Penne Bruckner, MD;  Location: [facility] OR;  Service: Vascular;  Laterality: Left;   LAPAROTOMY N/A 05/07/2024   Procedure: LAPAROTOMY, EXPLORATORY;  Surgeon: Kyle Berg, MD;  Location: [facility] OR;  Service: General;  Laterality: N/A;   LOWER EXTREMITY ANGIOGRAM Left 03/25/2024   Procedure: GERALYN, LOWER EXTREMITY;  Surgeon: Sheree Penne Bruckner, MD;  Location: [facility] OR;  Service: Vascular;  Laterality: Left;   PROSTATE BIOPSY  09/06/2012   Kyle. Alm Kelley   ROBOT ASSISTED LAPAROSCOPIC RADICAL PROSTATECTOMY N/A 10/31/2012   Procedure: ROBOTIC ASSISTED LAPAROSCOPIC RADICAL PROSTATECTOMY;  Surgeon: Kyle GORMAN Fragmin, MD;  Location: [facility] ORS;  Service: Urology;  Laterality: N/A;   THROMBECTOMY FEMORAL ARTERY Left 03/25/2024   Procedure: THROMBECTOMY, LEFT FEMORAL TO BELOW KNEE ARTERY;  Surgeon: Sheree Penne Bruckner, MD;  Location: [facility] OR;  Service: Vascular;  Laterality: Left;   TONSILLECTOMY AND ADENOIDECTOMY     TONSILLECTOMY AND ADENOIDECTOMY     VEIN HARVEST Left 03/25/2024   Procedure: SURGICAL PROCUREMENT, LEFT LOWER SAPHENOUS VEIN;  Surgeon: Sheree Penne Bruckner, MD;  Location: [facility] OR;  Service: Vascular;  Laterality: Left;    Family History: Family History   Problem Relation Age of Onset   Hypertension Mother    Heart disease Father    Hyperlipidemia Father    Cancer Father        prostate    Social History: Social History   Socioeconomic History   Marital status: Married    Spouse name: Not on file   Number of children: Not on file   Years of education: Not on file   Highest education level: Not on file  Occupational History   Not on file  Tobacco Use   Smoking status: Former    Current packs/day: 0.50    Average packs/day: 0.5 packs/day for 50.0 years (25.0 ttl pk-yrs)    Types: Cigarettes   Smokeless tobacco: Never  Vaping Use   Vaping status: Never Used  Substance and Sexual Activity   Alcohol use: Yes    Comment: rare, heavier in the past   Drug use: No  Sexual activity: Never  Other Topics Concern   Not on file  Social History Narrative   Tobacco Use cigarettes: Current Smoker   Smoking: Yes, less than 1/2 pk per month   Alcohol: Yes - No Alcohol since June 2005   Caffeine: yes   No recreational drug use   Occupation: Employed   Marital Status: Married 38 years, wife has MS   Children: none   Social Drivers of Health   Tobacco Use: Medium Risk (06/06/2024)   Patient History    Smoking Tobacco Use: Former    Smokeless Tobacco Use: Never    Passive Exposure: Not on Actuary Strain: Not on file  Food Insecurity: No Food Insecurity (06/17/2024)   Epic    Worried About Programme Researcher, Broadcasting/film/video in the Last Year: Never true    Ran Out of Food in the Last Year: Never true  Transportation Needs: No Transportation Needs (06/17/2024)   Epic    Lack of Transportation (Medical): No    Lack of Transportation (Non-Medical): No  Physical Activity: Not on file  Stress: Not on file  Social Connections: Moderately Integrated (06/17/2024)   Social Connection and Isolation Panel    Frequency of Communication with Friends and Family: More than three times a week    Frequency of Social Gatherings with Friends and  Family: Once a week    Attends Religious Services: 1 to 4 times per year    Active Member of [employer] or Organizations: No    Attends Banker Meetings: Never    Marital Status: Married  Depression (PHQ2-9): Not on file  Alcohol Screen: Not on file  Housing: Low Risk (06/17/2024)   Epic    Unable to Pay for Housing in the Last Year: No    Number of Times Moved in the Last Year: 0    Homeless in the Last Year: No  Utilities: Not At Risk (06/17/2024)   Epic    Threatened with loss of utilities: No  Health Literacy: Not on file    Allergies:  Allergies[1]  Objective:   Vital Signs:   Temp:  [97.5 F (36.4 C)-98.8 F (37.1 C)] 97.7 F (36.5 C) (01/23 0900) Pulse Rate:  [104-127] 119 (01/23 1015) Resp:  [14-32] 19 (01/23 1015) BP: (83-127)/(45-84) 96/59 (01/23 1015) SpO2:  [86 %-100 %] 96 % (01/23 1015) FiO2 (%):  [60 %-63 %] 61 % (01/23 0801) Weight:  [76.1 kg] 76.1 kg (01/23 0412) Last BM Date : 06/20/24  Weight change: Filed Weights   06/18/24 0800 06/21/24 0340 06/21/24 0412  Weight: 75 kg 76.1 kg 76.1 kg    Intake/Output:   Intake/Output Summary (Last 24 hours) at 06/21/2024 1101 Last data filed at 06/21/2024 0700 Gross per 24 hour  Intake 833.87 ml  Output 1199 ml  Net -365.13 ml    Physical Exam  General:  Ill  appearing.  in the chair  Cor: Regular rate & rhythm. No murmurs. JVD difficult to assess.   Lungs: Decreased on the left.  HFNC Extremities: R and LLE 2+ lower extremity edema. LLE full thickness wound.  Telemetry   A fib RVR 120-130s   EKG      Labs   Basic Metabolic Panel: Recent Labs  Lab 06/17/24 0456 06/17/24 0628 06/18/24 0305 06/18/24 1552 06/19/24 0402 06/19/24 1016 06/19/24 1633 06/20/24 0346 06/20/24 1652  NA 130*   < > 128* 129* 128*  128* 130* 132* 136 135  K 3.2*   < >  5.1 5.0 4.8  4.8 4.8 4.2 3.5 3.6  CL 87*   < > 89* 90* 89*  89* 91* 91* 93* 93*  CO2 31   < > 31 29 28  28 27 30  32 33*  GLUCOSE 164*   <  > 167* 109* 132*  133* 140* 146* 89 180*  BUN 48*   < > 54* 63* 75*  76* 75* 75* 68* 58*  CREATININE 1.22   < > 1.09 1.26* 1.36*  1.36* 1.35* 1.30* 1.19 1.10  CALCIUM  8.2*   < > 8.2* 8.0* 7.9*  7.9* 7.9* 7.8* 7.7* 7.7*  MG 2.5*  --   --  2.6*  --   --   --   --   --   PHOS  --   --  4.2  --  3.9  --   --   --   --    < > = values in this interval not displayed.    Liver Function Tests: Recent Labs  Lab 06/19/24 0402 06/19/24 1016 06/19/24 1633 06/20/24 0346 06/20/24 1652  AST 667* 738* 594* 370* 230*  ALT 730* 771* 720* 559* 444*  ALKPHOS 120 123 118 108 105  BILITOT 0.5 0.5 0.5 0.5 0.5  PROT 6.7 6.8 6.8 6.5 6.3*  ALBUMIN  2.4*  2.5* 2.5* 2.4* 2.3* 2.2*   No results for input(s): LIPASE, AMYLASE in the last 168 hours. Recent Labs  Lab 06/16/24 2209  AMMONIA 39*    CBC: Recent Labs  Lab 06/16/24 1915 06/16/24 1931 06/17/24 0456 06/17/24 0628 06/17/24 1146 06/18/24 0305 06/19/24 0402 06/20/24 0346 06/21/24 0336  WBC 24.3*   < > 28.1*  --   --  25.9* 27.2* 20.8* 16.1*  NEUTROABS 23.0*  --   --   --   --   --   --   --   --   HGB 9.3*   < > 9.5*   < > 10.9* 8.7* 8.1* 7.9* 7.5*  HCT 32.2*   < > 31.3*   < > 32.0* 29.1* 26.2* 25.7* 25.3*  MCV 82.1   < > 76.9*  --   --  78.6* 76.8* 75.8* 77.4*  PLT 334   < > 355  --   --  327 286 247 255   < > = values in this interval not displayed.    Cardiac Enzymes: Recent Labs  Lab 06/19/24 1016  CKTOTAL 74    BNP: BNP (last 3 results) Recent Labs    05/08/24 0500  BNP 351.1*    ProBNP (last 3 results) Recent Labs    06/19/24 1016 06/20/24 0346 06/21/24 0336  PROBNP 7,678.0* 6,984.0* 4,584.0*     CBG: Recent Labs  Lab 06/20/24 1508 06/20/24 2010 06/20/24 2347 06/21/24 0330 06/21/24 0745  GLUCAP 160* 156* 91 99 105*    Coagulation Studies: No results for input(s): LABPROT, INR in the last 72 hours.   Imaging   No results found.  Medications:   Current Medications:  ascorbic acid    500 mg Oral Daily   Chlorhexidine  Gluconate Cloth  6 each Topical Daily   clopidogrel   75 mg Oral Daily   collagenase    Topical Daily   famotidine   20 mg Oral QHS   feeding supplement  237 mL Oral TID BM   insulin  aspart  0-9 Units Subcutaneous Q4H   ipratropium-albuterol   3 mL Nebulization Q6H   lactulose   20 g Oral TID   multivitamin with minerals  1  tablet Oral Daily   mouth rinse  15 mL Mouth Rinse Q2H   polyethylene glycol  17 g Oral BID   senna-docusate  2 tablet Oral QHS   sodium chloride  flush  10-40 mL Intracatheter Q12H   thiamine   100 mg Oral Daily   zinc  sulfate (50mg  elemental zinc )  220 mg Oral Daily    Infusions:  amiodarone      cefTRIAXone  (ROCEPHIN )  IV Stopped (06/20/24 1929)   DOBUTamine  5 mcg/kg/min (06/21/24 1053)   heparin  1,400 Units/hr (06/21/24 0700)   norepinephrine  (LEVOPHED ) Adult infusion Stopped (06/21/24 0001)      Assessment/Plan  Shock, ? Mixed Picture-->Cardiogenic + Septic  Possible aspiration pneumonia. Respiratory Panel negative.  Bld CX 1/4 Gram + Cocci ? Contaminant  WBC trending down. On rocephin  Echo this admit LVEF down 30-35% from previous 55-60%. Acute HFrEF. Possibly tachy mediated. He has not have ischemic evaluation. No chest pain.  - Initially on Norepi + DBA. Now off Norepi. CO-OX stable on DBA 5 mcg and was weaned to 2.5 mcg but CO-OX dropped.  - DBA increased back to 5 mcg. Check CO-OX now.  - CVP 8 but with significant lower extremity edema.  Start lasix  80 mg twice a day.  Concerned he also has RV failure. Repeat ECHO.  Afib RVR on admit - Permanent A fib  - On heparin  drip + amio drip. Rate uncontrolled today. Amio increased to 60 mg per hour. Give 150 mg bolus amiodarone .  Add SCDs   Acute Hypoxic Respirator Failure HCAP versus Aspiration.  -Intubated on arrival.  -Extubated 06/19/24 --on HFNC 35 liters.   Anemia  HX of GI Bleed 05/04/24 EGD showing duodena ulcer Hx of perforated duodenal ulcer-taken to OR on 12/9  s/p ex-lap, graham patch repair.  -Hgb trending down 10.9--->7.5 today.  No obvious source of bleeding.  May require transfusion.   PAD , Ischemic Leg in 02/2024- s/p left tibioperoneal trunk endarterectomy with vein patch angioplasty, and stenting of left SFA-  - LE DVT S/P Thrombectomy  -LLE full thickness wound. Continue wound care.   Aortic Stenosis  -Moderate previous ECHO. Calcification.  -Repeat Echo   Pleural Effusion,Bilateral  L>R       Length of Stay: 5  Amy Clegg, NP  06/21/2024, 11:01 AM  Advanced Heart Failure Team Pager [phone] (M-F; 7a - 5p)   Please visit [email or web address]: For overnight coverage please call cardiology fellow first. If fellow not available call Shock/ECMO MD on call.  For ECMO / Mechanical Support (Impella, IABP, LVAD) issues call Shock / ECMO MD on call.   Agree with above  Chronically ill 79 y/o male with COPD (recently quit smoking after 60 years), PAD, chronic AF, aortic stenosis  Underwent LLE revascularization in 12/25 due to non-healing wounds. Has been in SNF. Admitted with sepsis and respiratory failure.   Found to have large LUL PNA thought to be aspiration. Was intubated.   Echo EF 30-35% RV moderately down ? LG/LF AS. Started on DBA for low co-ox   Now extubated. On HF O2  Remains in AF with RVR (chronic AF)   DBA turned down from 5-> 2.5 today and co-ox dropped to 47% Now back on DBA 5  Pending thora for large left pleural effusion   General:  Sitting up in bed. Frail elderly.  HEENT: normal Neck: supple. no JVD.  Cor: Regular rate & rhythm. No rubs, gallops or murmurs. Lungs: decreased throughout crackles on left. Abdomen: soft, nontender, nondistended.Good bowel sounds. Extremities:  no cyanosis, clubbing, rash, 3+ edema on RLE. LLE wrapped Neuro: alert & orientedx3, cranial nerves grossly intact. moves all 4 extremities w/o difficulty. Affect pleasant  Suspect major issues is severe LUL aspiration PNA with near white  out of LUL. However condition also complicated by new onset biventricular dysfunction and probably at least moderate AS. He also has chronic AF, LE wounds.  Would continue DBA. Place TED hose on RLE. Diurese as tolerated.   Suspect he has severe underlying CAD but no denies angina. Can consider cath at some point down the road if/when he recovers.   CRITICAL CARE Performed by: Cherrie Sieving  Total critical care time: 51 minutes  Critical care time was exclusive of separately billable procedures and treating other patients.  Critical care was necessary to treat or prevent imminent or life-threatening deterioration.  Critical care was time spent personally by me (independent of midlevel providers or residents) on the following activities: development of treatment plan with patient and/or surrogate as well as nursing, discussions with consultants, evaluation of patient's response to treatment, examination of patient, obtaining history from patient or surrogate, ordering and performing treatments and interventions, ordering and review of laboratory studies, ordering and review of radiographic studies, pulse oximetry and re-evaluation of patient's condition.  Sieving Cherrie, MD  6:24 PM        [1]  Allergies Allergen Reactions   Ace Inhibitors Anaphylaxis   Bactrim  Ds [Sulfamethoxazole -Trimethoprim ] Shortness Of Breath   Rythmol [Propafenone] Shortness Of Breath   Nitrofurantoin Rash   "

## 2024-06-21 NOTE — Plan of Care (Signed)
" °  Problem: Coping: Goal: Ability to adjust to condition or change in health will improve Outcome: Progressing   Problem: Metabolic: Goal: Ability to maintain appropriate glucose levels will improve Outcome: Progressing   Problem: Role Relationship: Goal: Method of communication will improve Outcome: Progressing   Problem: Elimination: Goal: Will not experience complications related to bowel motility Outcome: Progressing Goal: Will not experience complications related to urinary retention Outcome: Progressing   Problem: Pain Managment: Goal: General experience of comfort will improve and/or be controlled Outcome: Progressing   Problem: Safety: Goal: Ability to remain free from injury will improve Outcome: Progressing   "

## 2024-06-21 NOTE — Evaluation (Signed)
 Occupational Therapy Evaluation Patient Details Name: Kyle Kelley MRN: [id number] DOB: [birth year] Today's Date: 06/21/2024   History of Present Illness   79 yo male presents from Rehab Facility 06/16/2024 for respiratory distress intubated on arrival.  Severe L-sided aspiration PNA and resultant septic shock.  PMHx: atrial fibrillation (on Eliquis /recently held), vasculopath, acute on chronic ischemic LLE nonhealing wound, HTN, HLD, aortic stenosis, CVA, prostate cancer s/p prostatectomy 2014, and PVD.     Clinical Impressions Pt admitted based on above, and was seen based on problem list below. Prior to initial admission pt was ind with ADLs, since being d/c to SNF pt was working with OT/PT services and progressing well. Today pt is requiring set up  to mod +2  for ADLs. Bed mobility and functional transfers are  mod +2 . Pt limited by decreased strength, activity tolerance, and balance. Recommend pt return to skilled facility to continue post-acute rehab. OT will continue to follow acutely to maximize functional independence.     If plan is discharge home, recommend the following:   A lot of help with walking and/or transfers;A lot of help with bathing/dressing/bathroom;Assistance with cooking/housework;Assist for transportation     Functional Status Assessment   Patient has had a recent decline in their functional status and demonstrates the ability to make significant improvements in function in a reasonable and predictable amount of time.     Equipment Recommendations   Other (comment) (Defer to next venue)      Precautions/Restrictions   Precautions Precautions: Fall Recall of Precautions/Restrictions: Intact Precaution/Restrictions Comments: [facility] Restrictions Weight Bearing Restrictions Per Provider Order: No     Mobility Bed Mobility Overal bed mobility: Needs Assistance Bed Mobility: Supine to Sit     Supine to sit: Mod assist, HOB elevated, Used rails      General bed mobility comments: Assist to manage BLEs and trunk    Transfers Overall transfer level: Needs assistance Equipment used: Rolling walker (2 wheels) Transfers: Sit to/from Stand, Bed to chair/wheelchair/BSC Sit to Stand: Mod assist, +2 safety/equipment     Step pivot transfers: Min assist, +2 safety/equipment     General transfer comment: Mod +2 to boost to stand, once in standing, min assist to manage RW for step pivot transfer      Balance Overall balance assessment: Needs assistance Sitting-balance support: Bilateral upper extremity supported, Feet supported Sitting balance-Leahy Scale: Poor Sitting balance - Comments: L lateral lean, with cueing pt able to correct   Standing balance support: Bilateral upper extremity supported, During functional activity, Reliant on assistive device for balance Standing balance-Leahy Scale: Poor Standing balance comment: reliant on RW       ADL either performed or assessed with clinical judgement   ADL Overall ADL's : Needs assistance/impaired Eating/Feeding: Set up;Sitting   Grooming: Set up;Sitting   Upper Body Bathing: Set up;Sitting   Lower Body Bathing: Moderate assistance;+2 for physical assistance;Sit to/from stand   Upper Body Dressing : Set up;Sitting   Lower Body Dressing: Moderate assistance;+2 for physical assistance;+2 for safety/equipment;Sit to/from stand   Toilet Transfer: Moderate assistance;+2 for physical assistance;+2 for safety/equipment;Stand-pivot;Rolling walker (2 wheels)           Functional mobility during ADLs: Moderate assistance;+2 for physical assistance;+2 for safety/equipment;Rolling walker (2 wheels) General ADL Comments: Limited by decreased strength and activity tolerance     Vision Baseline Vision/History: 0 No visual deficits Vision Assessment?: No apparent visual deficits            Pertinent Vitals/Pain Pain  Assessment Pain Assessment: Faces Faces Pain Scale: Hurts  a little bit Pain Location: generalized Pain Descriptors / Indicators: Grimacing, Guarding Pain Intervention(s): Monitored during session     Extremity/Trunk Assessment Upper Extremity Assessment Upper Extremity Assessment: Generalized weakness   Lower Extremity Assessment Lower Extremity Assessment: Defer to PT evaluation   Cervical / Trunk Assessment Cervical / Trunk Assessment: Kyphotic   Communication Communication Communication: Impaired Factors Affecting Communication: Hearing impaired   Cognition Arousal: Alert Behavior During Therapy: WFL for tasks assessed/performed Cognition: No apparent impairments       OT - Cognition Comments: Mild deficits, likely d/t HoH       Following commands: Intact       Cueing  General Comments   Cueing Techniques: Verbal cues;Gestural cues;Tactile cues  RN present throughout session to assist    Home Living Family/patient expects to be discharged to:: Private residence Living Arrangements: Spouse/significant other Available Help at Discharge: Family;Available 24 hours/day Type of Home: House Home Access: Stairs to enter [employer] of Steps: 3 Entrance Stairs-Rails: Left Home Layout: One level     Bathroom Shower/Tub: Chief Strategy Officer: Standard Bathroom Accessibility: Yes   Home Equipment: Agricultural Consultant (2 wheels);Cane - single point;Grab bars - tub/shower;Hand held shower head;Grab bars - toilet          Prior Functioning/Environment Prior Level of Function : Independent/Modified Independent             Mobility Comments: Prior to rehab pt was ind with no AD, since has been completing PT at skilled facility ADLs Comments: Prior to rehab pt was ind with sponge baths, since  has been working with OT services at skilled facility    OT Problem List: Decreased strength;Decreased range of motion;Decreased activity tolerance;Impaired balance (sitting and/or standing);Decreased  cognition;Decreased safety awareness;Cardiopulmonary status limiting activity   OT Treatment/Interventions: Self-care/ADL training;Therapeutic exercise;Energy conservation;DME and/or AE instruction;Therapeutic activities;Patient/family education;Balance training      OT Goals(Current goals can be found in the care plan section)   Acute Rehab OT Goals Patient Stated Goal: To get better OT Goal Formulation: With patient Time For Goal Achievement: 07/05/24 Potential to Achieve Goals: Good   OT Frequency:  Min 1X/week       AM-PAC OT 6 Clicks Daily Activity     Outcome Measure Help from another person eating meals?: None Help from another person taking care of personal grooming?: A Little Help from another person toileting, which includes using toliet, bedpan, or urinal?: A Lot Help from another person bathing (including washing, rinsing, drying)?: A Lot Help from another person to put on and taking off regular upper body clothing?: A Little Help from another person to put on and taking off regular lower body clothing?: A Lot 6 Click Score: 16   End of Session Equipment Utilized During Treatment: Rolling walker (2 wheels);Oxygen Nurse Communication: Mobility status  Activity Tolerance: Patient tolerated treatment well Patient left: in chair;with call bell/phone within reach;with chair alarm set;with nursing/sitter in room  OT Visit Diagnosis: Unsteadiness on feet (R26.81);Other abnormalities of gait and mobility (R26.89);Muscle weakness (generalized) (M62.81)                Time: 9095-9069 OT Time Calculation (min): 26 min Charges:  OT General Charges $OT Visit: 1 Visit OT Evaluation $OT Eval Moderate Complexity: 1 Mod OT Treatments $Self Care/Home Management : 8-22 mins  Adrianne BROCKS, OT  Acute Rehabilitation Services Office [phone] Secure chat preferred   Adrianne GORMAN Savers 06/21/2024, 11:48 AM

## 2024-06-22 DIAGNOSIS — I110 Hypertensive heart disease with heart failure: Secondary | ICD-10-CM | POA: Diagnosis not present

## 2024-06-22 DIAGNOSIS — D649 Anemia, unspecified: Secondary | ICD-10-CM | POA: Diagnosis not present

## 2024-06-22 DIAGNOSIS — I5020 Unspecified systolic (congestive) heart failure: Secondary | ICD-10-CM | POA: Diagnosis not present

## 2024-06-22 DIAGNOSIS — I739 Peripheral vascular disease, unspecified: Secondary | ICD-10-CM | POA: Diagnosis not present

## 2024-06-22 DIAGNOSIS — R570 Cardiogenic shock: Secondary | ICD-10-CM

## 2024-06-22 DIAGNOSIS — R739 Hyperglycemia, unspecified: Secondary | ICD-10-CM | POA: Diagnosis not present

## 2024-06-22 DIAGNOSIS — I482 Chronic atrial fibrillation, unspecified: Secondary | ICD-10-CM | POA: Diagnosis not present

## 2024-06-22 DIAGNOSIS — J9601 Acute respiratory failure with hypoxia: Secondary | ICD-10-CM | POA: Diagnosis not present

## 2024-06-22 DIAGNOSIS — R6521 Severe sepsis with septic shock: Secondary | ICD-10-CM | POA: Diagnosis not present

## 2024-06-22 DIAGNOSIS — A419 Sepsis, unspecified organism: Secondary | ICD-10-CM | POA: Diagnosis not present

## 2024-06-22 DIAGNOSIS — E43 Unspecified severe protein-calorie malnutrition: Secondary | ICD-10-CM | POA: Diagnosis not present

## 2024-06-22 DIAGNOSIS — E785 Hyperlipidemia, unspecified: Secondary | ICD-10-CM | POA: Diagnosis not present

## 2024-06-22 LAB — BASIC METABOLIC PANEL WITH GFR
Anion gap: 6 (ref 5–15)
BUN: 36 mg/dL — ABNORMAL HIGH (ref 8–23)
CO2: 35 mmol/L — ABNORMAL HIGH (ref 22–32)
Calcium: 7.8 mg/dL — ABNORMAL LOW (ref 8.9–10.3)
Chloride: 94 mmol/L — ABNORMAL LOW (ref 98–111)
Creatinine, Ser: 0.75 mg/dL (ref 0.61–1.24)
GFR, Estimated: >60 mL/min
Glucose, Bld: 88 mg/dL (ref 70–99)
Potassium: 3.4 mmol/L — ABNORMAL LOW (ref 3.5–5.1)
Sodium: 135 mmol/L (ref 135–145)

## 2024-06-22 LAB — CBC
HCT: 24.6 % — ABNORMAL LOW (ref 39.0–52.0)
Hemoglobin: 7.2 g/dL — ABNORMAL LOW (ref 13.0–17.0)
MCH: 22.9 pg — ABNORMAL LOW (ref 26.0–34.0)
MCHC: 29.3 g/dL — ABNORMAL LOW (ref 30.0–36.0)
MCV: 78.3 fL — ABNORMAL LOW (ref 80.0–100.0)
Platelets: 255 10*3/uL (ref 150–400)
RBC: 3.14 MIL/uL — ABNORMAL LOW (ref 4.22–5.81)
RDW: 19.9 % — ABNORMAL HIGH (ref 11.5–15.5)
WBC: 10.6 10*3/uL — ABNORMAL HIGH (ref 4.0–10.5)
nRBC: 0.4 % — ABNORMAL HIGH (ref 0.0–0.2)

## 2024-06-22 LAB — GLUCOSE, CAPILLARY
Glucose-Capillary: 89 mg/dL (ref 70–99)
Glucose-Capillary: 99 mg/dL (ref 70–99)
Glucose-Capillary: 97 mg/dL (ref 70–99)
Glucose-Capillary: 107 mg/dL — ABNORMAL HIGH (ref 70–99)
Glucose-Capillary: 202 mg/dL — ABNORMAL HIGH (ref 70–99)
Glucose-Capillary: 142 mg/dL — ABNORMAL HIGH (ref 70–99)
Glucose-Capillary: 136 mg/dL — ABNORMAL HIGH (ref 70–99)
Glucose-Capillary: 112 mg/dL — ABNORMAL HIGH (ref 70–99)

## 2024-06-22 LAB — APTT
aPTT: 46 s — ABNORMAL HIGH (ref 24–36)
aPTT: 67 s — ABNORMAL HIGH (ref 24–36)

## 2024-06-22 LAB — PROTEIN, TOTAL: Total Protein: 6.1 g/dL — ABNORMAL LOW (ref 6.5–8.1)

## 2024-06-22 LAB — HEPARIN LEVEL (UNFRACTIONATED): Heparin Unfractionated: 0.35 [IU]/mL (ref 0.30–0.70)

## 2024-06-22 LAB — COOXEMETRY PANEL
Carboxyhemoglobin: 1.9 % — ABNORMAL HIGH (ref 0.5–1.5)
Methemoglobin: <0.7 % (ref 0.0–1.5)
O2 Saturation: 72 %
Total hemoglobin: 7.5 g/dL — ABNORMAL LOW (ref 12.0–16.0)

## 2024-06-22 LAB — ALBUMIN: Albumin: 2.3 g/dL — ABNORMAL LOW (ref 3.5–5.0)

## 2024-06-22 LAB — PRO BRAIN NATRIURETIC PEPTIDE: Pro Brain Natriuretic Peptide: 3305 pg/mL — ABNORMAL HIGH

## 2024-06-22 LAB — LACTATE DEHYDROGENASE: LDH: 312 U/L — ABNORMAL HIGH (ref 105–235)

## 2024-06-22 MED ORDER — POTASSIUM CHLORIDE 20 MEQ PO PACK
40.0000 meq | PACK | Freq: Four times a day (QID) | ORAL | Status: AC
  Administered 2024-06-22 (×2): 40 meq via ORAL
  Filled 2024-06-22 (×2): qty 2

## 2024-06-22 MED ORDER — FUROSEMIDE 10 MG/ML IJ SOLN
120.0000 mg | Freq: Four times a day (QID) | INTRAVENOUS | Status: AC
  Administered 2024-06-22 (×2): 120 mg via INTRAVENOUS
  Filled 2024-06-22 (×2): qty 120

## 2024-06-22 MED ORDER — POTASSIUM CHLORIDE CRYS ER 20 MEQ PO TBCR: 40.0000 meq | EXTENDED_RELEASE_TABLET | Freq: Four times a day (QID) | ORAL | Status: DC

## 2024-06-22 MED ORDER — POTASSIUM CHLORIDE CRYS ER 20 MEQ PO TBCR
40.0000 meq | EXTENDED_RELEASE_TABLET | Freq: Four times a day (QID) | ORAL | Status: DC
  Administered 2024-06-22: 40 meq via ORAL
  Filled 2024-06-22: qty 2

## 2024-06-22 NOTE — Progress Notes (Signed)
 ANTICOAGULATION CONSULT NOTE  Pharmacy Consult for heparin  Indication: Afib (Eliquis  PTA), CLI s/p thrombectomy/endarterectomy 02/2024 *hx ICH (cleared by [facility] for anticoagulation 02/2024), GIB 04/2024 s/p repair  Allergies[1]  Patient Measurements: Height: 6' 1 (185.4 cm) Weight: 74.5 kg (164 lb 3.9 oz) IBW/kg (Calculated) : 79.9 Heparin  Dosing Weight: 74 kg   Vital Signs: Temp: 98.5 F (36.9 C) (01/24 0339) Temp Source: Oral (01/24 0339) BP: 107/59 (01/24 0645) Pulse Rate: 100 (01/24 0645)  Labs: Recent Labs    06/19/24 1016 06/19/24 1633 06/20/24 0346 06/20/24 0507 06/20/24 1652 06/21/24 0336 06/22/24 0502  HGB  --    < > 7.9*  --   --  7.5* 7.2*  HCT  --   --  25.7*  --   --  25.3* 24.6*  PLT  --   --  247  --   --  255 255  APTT  --   --   --  57*  --  72* 46*  HEPARINUNFRC  --   --   --  1.10*  --  0.68 0.35  CREATININE 1.35*   < > 1.19  --  1.10 0.85  --   CKTOTAL 74  --   --   --   --   --   --    < > = values in this interval not displayed.     Assessment: 83 yoM presents from SNF with respiratory failure now with newly reduced LVEF (30-35%), AF RVR, and possible HCAP vs aspiration PNA..  On Eliquis  PTA for Afib (last dose 1/18 AM).  Pharmacy consulted for heparin  dosing.  Per discussion with CCM 1/19, plan to defer bolus and pursue low-goal with hx of recent GIB, ICH and anemia at baseline.  aPTT 46s is subtherapeutic with heparin  running at 1400 units/hr. Heparin  level 0.35 is still not correlating. Hgb (7.2) and PLTs (255) are stable. Per RN, no report of pauses, issues with the line, or signs of bleeding.   Goal of Therapy:  Heparin  level 0.3-0.5 units/ml aPTT 66-85 seconds  Monitor platelets by anticoagulation protocol: Yes   Plan:  Increase heparin  1500 units/hr Check 8 hour aPTT Daily aPTT, heparin  level, CBC  Thank you for allowing pharmacy to be a part of this patients care.   Nidia Schaffer, PharmD, BCPS PGY2 Cardiology Pharmacy  Resident  Please check AMION for all [facility] Pharmacy phone numbers After 10:00 PM, call Main Pharmacy [phone] 06/22/2024     [1]  Allergies Allergen Reactions   Ace Inhibitors Anaphylaxis   Bactrim  Ds [Sulfamethoxazole -Trimethoprim ] Shortness Of Breath   Rythmol [Propafenone] Shortness Of Breath   Nitrofurantoin Rash

## 2024-06-22 NOTE — Progress Notes (Signed)
 "  NAME:  Kyle Kelley, MRN:  [id number], DOB:  08-05-1945, LOS: 6 ADMISSION DATE:  06/16/2024, CONSULTATION DATE:  06/16/24 REFERRING MD:  EDP, CHIEF COMPLAINT:  acute hypoxic resp failure   History of Present Illness:  79 yo male was reportedly in normal state of health at rehab facility until yesterday afternoon/evening. Pt has complex medical history with frequent hospitalizations since October 2/2 PAD, GI bleed with perforated duodenal ulcer, wound dehiscence with wound vac placement and need for rehab/snf placement at d/c  .   All history is obtained from chart review and wife at bedside as pt is intubated and sedated.   Per wife pt was participating in rehab yesterday, walking, eating overall feeling well yesterday. In evening he did not eat much and went to bed. Unclear about events of today, per her however tonight he presented to [facility] via EMS after being found in resp distress. EMS found him unresponsive with diffuse wheezing. He was recently started on diuretics 2/2 increased edema since surgery. He was also in afib with rvr. Upon arrival to ed he was unresponsive and emergently intubated for hypoxia and airway protection. He was hypotensive unresponsive to fluids and started on levo escalated up to 25mcg at this time  Ccm asked to admit for resp failure and shock. Wife requests full code for now however expressed understanding of critical state, indicated would not want long term support.  Pertinent  Medical History  Afib on chronic a/c Pad with chronic LLE non healing wound Htn Hyperlipidemia Ao stenosis H/o cva H/o prostate cancer H/o LLE arterial occlusion with critical limb ischemia 10/25, s/p thrombectomy H/o GI bleed 2/2 duodenal ulcer 12/25 H/o perforated small bowel s/p Arlyss patch repair and lengthy TPN  Significant Hospital Events: Including procedures, antibiotic start and stop dates in addition to other pertinent events   Admitted to ICU 06/16/24 1/19 Intubated,  sedated, Afib RVR, on pressors  1/20 Pressor requirements improving, Afib 120s-130s, now following commands, still no BM- SMOG enema given. Stress dose steroids and flagyl  stopped 1/21 remains in A-fib but rate controlled.  Failed SBT.  Culture data still negative.  Added dobutamine  1/22 awake with stable respiratory status post extubation 1/23 remains on dobutamine  and HFNC, off pressors. Thora on left- 1200cc removed, not fully evacuated.   Interim History / Subjective:  Feels improved since thora yesterday.  Down to 6L O2 today. Remains on DBA .   Objective    Blood pressure 107/64, pulse 89, temperature 97.6 F (36.4 C), temperature source Axillary, resp. rate (!) 22, height 6' 1 (1.854 m), weight 74.5 kg, SpO2 100%. CVP:  [3 mmHg-9 mmHg] 8 mmHg  FiO2 (%):  [45 %-62 %] 45 %   Intake/Output Summary (Last 24 hours) at 06/22/2024 1628 Last data filed at 06/22/2024 0800 Gross per 24 hour  Intake 1329.85 ml  Output 1650 ml  Net -320.15 ml   Filed Weights   06/21/24 0340 06/21/24 0412 06/22/24 0500  Weight: 76.1 kg 76.1 kg 74.5 kg    General: chronically ill appearing man sitting up in the chair in NAD HEENT: [place]/AT, eyes anicteric Neuro: awake alert, moving extremities but globally weak CV: S1S2, RRR PULM: reduced basilar breath sounds, minimal rhales, no wheezing GI: soft, NT Extremities: R>LLE, muscle wasting  I/O -700cc, net +1.2L for admission  Coox 72% BUN 36 Cr 0.75 WBC 10.6 H/H 7.2/24.6 Platelets 255   Pleural fluid 2276 WBC, 85% PMNs Pleural fluid culture: NGTD  Resolved problem list  Assessment and Plan   Acute hypoxic respiratory failure secondary to secondary to HAP vs aspiration concurrent with a component of pulmonary edema from CHF MRSA negative -con't ceftriaxone  for 7 day course -weaning supplemental O2 -con't diuresis; if R effusion not resolving may need to drain this too -lasix  120mg  x 2 today -pulmonary hygiene  Mixed Shock- Septic  now Cardiogenic component  Acute HFrEF 30-35%, RV normal AS -con't DBA -serial coox -aggressive diuresis -goal MAP >65 -con't antibiotics  Afib RVR -hx of chronic afib on eliquis    -IV amiodarone  and heparin   AKI> resolved; suspect was due to sepsis -strict I/O -renally dose meds, avoid nephrotoxic meds -maintain adequate perfusion -con't foley -lasix  x2 today   Acute on Chronic Anemia Hx of GI bleed- 05/04/24-EGD showing duodenal ulcer Hx of perforated duodenal ulcer-taken to OR on 12/9 s/p ex-lap, graham patch repeair  - monitor for bleeding -transfuse for Hb <7 or hemodynamically significant bleeding  Elevated LFTs- Suspect shock liver and congestive hepatopathy -trend -diuresis  RLE edema, asymmetric--- he reports chronic H/o severe PAD w/ Recent LE DVT s/p Thrombectomy on left Acute Ischemic Leg in 02/2024- s/p left tibioperoneal trunk endarterectomy with vein patch angioplasty, and stenting of left SFA- on plavix  PTA  -plavix  -heparin  -Reviewed chart-had negative DVT study 05/16/2024 on right lower extremity and has been on anticoagulation since so low for DVT at this point  H/o HTN & HLD  -Continue to hold antihypertensives for now in setting of vasopressors and statin due to LFTs  Remote H/o ICH -monitor closely on AC  Hyperglycemia -SSI PRN -goal BG 140-180  Severe protein energy malnutrition Dysphagia -con't reg diet, monitor for poor tolerance  Constipation -lactulose    Wife updated at bedside this evening.  She understands the complexity of his medical conditions and my concern with frailty and repeat hospitalizations.  This patient is critically ill with multiple organ system failure which requires frequent high complexity decision making, assessment, support, evaluation, and titration of therapies. This was completed through the application of advanced monitoring technologies and extensive interpretation of multiple databases. During this  encounter critical care time was devoted to patient care services described in this note for 43 minutes.  Kyle SHAUNNA Gaskins, DO 06/22/24 6:07 PM [facility]  For contact information, see Amion. If no response to pager, please call PCCM 2H APP. After hours, 7PM- 7AM, please call on call APP for 2H.  "

## 2024-06-22 NOTE — Progress Notes (Signed)
 "    Advanced Heart Failure Rounding Note  Cardiologist: Wilbert Bihari, MD  Chief Complaint: cardiogenic shock Subjective:    No specific complaints this morning, just feels off. HF controlled afib, coox 72.   Objective:   Weight Range: 74.5 kg Body mass index is 21.67 kg/m.   Vital Signs:   Temp:  [97.4 F (36.3 C)-99.1 F (37.3 C)] 97.4 F (36.3 C) (01/24 1100) Pulse Rate:  [89-121] 97 (01/24 0900) Resp:  [13-29] 20 (01/24 0900) BP: (83-116)/(45-75) 107/64 (01/24 0900) SpO2:  [88 %-100 %] 99 % (01/24 0900) FiO2 (%):  [47 %-62 %] 47 % (01/24 0820) Weight:  [74.5 kg] 74.5 kg (01/24 0500) Last BM Date : 06/21/24  Weight change: Filed Weights   06/21/24 0340 06/21/24 0412 06/22/24 0500  Weight: 76.1 kg 76.1 kg 74.5 kg    Intake/Output:   Intake/Output Summary (Last 24 hours) at 06/22/2024 1131 Last data filed at 06/22/2024 0800 Gross per 24 hour  Intake 1329.85 ml  Output 2150 ml  Net -820.15 ml      Physical Exam    GENERAL: Frail, cachectic appearing PULM:  increased work of breathing CARDIAC:  JVP: mildly elevated         Irregular rate and rhythm, HR controlled, soft systolic murmur with clear S2, trace LE edema ABDOMEN: Soft, non-tender, non-distended. NEUROLOGIC: Patient is oriented x3 with no focal or lateralizing neurologic deficits.     Telemetry   Afib in the 90s  Medications:     Scheduled Medications:  ascorbic acid   500 mg Oral Daily   Chlorhexidine  Gluconate Cloth  6 each Topical Daily   clopidogrel   75 mg Oral Daily   collagenase    Topical Daily   famotidine   20 mg Oral QHS   feeding supplement  237 mL Oral TID BM   insulin  aspart  0-9 Units Subcutaneous Q4H   ipratropium-albuterol   3 mL Nebulization Q6H   lactulose   20 g Oral TID   multivitamin with minerals  1 tablet Oral Daily   polyethylene glycol  17 g Oral BID   potassium chloride   40 mEq Oral Q6H   senna-docusate  2 tablet Oral QHS   sodium chloride  flush  10-40 mL  Intracatheter Q12H   thiamine   100 mg Oral Daily   zinc  sulfate (50mg  elemental zinc )  220 mg Oral Daily    Infusions:  amiodarone  60 mg/hr (06/22/24 0800)   cefTRIAXone  (ROCEPHIN )  IV Stopped (06/21/24 2010)   DOBUTamine  5 mcg/kg/min (06/22/24 0800)   furosemide      heparin  1,500 Units/hr (06/22/24 1108)   norepinephrine  (LEVOPHED ) Adult infusion Stopped (06/21/24 0001)    PRN Medications: haloperidol  lactate, ipratropium-albuterol , ondansetron  (ZOFRAN ) IV, sodium chloride  flush    Patient Profile   Kyle Kelley is a 79 y.o. male with a history of prostate cancer, CVA, multiple hospitalizations secondary to PAD, GI bleed perforated duodenal ulcer, wound dehiscence requiring skilled placement. Here with mixed shock.  Assessment/Plan   Mixed shock With potential aspiration component, low coox with IV DBA at 5 Stable today but will continue to aid in volume removal EF improved after HR control While ischemic eval may be reasonable down the road if significant recovery, but doubt it will change management Palliative care consult Continue IV lasix  80mg  BID today   Afib RVR on admit - Permanent A fib  - On heparin  drip + amio drip. Rate much better controlled today. Continue amiodarone  60    Acute Hypoxic Respirator Failure HCAP  versus Aspiration.  -Intubated on arrival.  -Extubated 06/19/24 --on HFNC   Anemia  HX of GI Bleed 05/04/24 EGD showing duodena ulcer Hx of perforated duodenal ulcer-taken to OR on 12/9 s/p ex-lap, graham patch repair.  -Hgb trending down 10.9--->7.2 today.  No obvious source of bleeding.  Hold transfusion   PAD , Ischemic Leg in 02/2024- s/p left tibioperoneal trunk endarterectomy with vein patch angioplasty, and stenting of left SFA-  -LE DVT S/P Thrombectomy  -LLE full thickness wound. Continue wound care.    Aortic Stenosis  -Moderate previous ECHO. Calcification.  -Based on echo is not severe   Pleural Effusion,Bilateral  L>R    Palliative care consultation given mutiple comorbid medical conditions, severe frailty and malnutrition  CRITICAL CARE Performed by: Morene JINNY Brownie   Total critical care time: 31 minutes  Critical care time was exclusive of separately billable procedures and treating other patients.  Critical care was necessary to treat or prevent imminent or life-threatening deterioration.  Critical care was time spent personally by me on the following activities: development of treatment plan with patient and/or surrogate as well as nursing, discussions with consultants, evaluation of patient's response to treatment, examination of patient, obtaining history from patient or surrogate, ordering and performing treatments and interventions, ordering and review of laboratory studies, ordering and review of radiographic studies, pulse oximetry and re-evaluation of patient's condition.   Length of Stay: 6  Morene JINNY Brownie, MD  06/22/2024, 11:31 AM  Advanced Heart Failure Team Pager [phone] (M-F; 7a - 5p)   Please visit [email or web address]: For overnight coverage please call cardiology fellow first. If fellow not available call Shock/ECMO MD on call.  For ECMO / Mechanical Support (Impella, IABP, LVAD) issues call Shock / ECMO MD on call.    "

## 2024-06-22 NOTE — Progress Notes (Signed)
 RT note. Patient placed on 6L HFNC at this time, patient sat 100% with no labored breathing noted. RT will continue to monitor.    06/22/24 1345  Therapy Vitals  Pulse Rate 89  Resp (!) 22  MEWS Score/Color  MEWS Score 1  MEWS Score Color Green  Respiratory Assessment  Assessment Type Post-treatment  Respiratory Pattern Regular;Unlabored  Chest Assessment Chest expansion symmetrical  Bilateral Breath Sounds Clear;Diminished  Oxygen Therapy/Pulse Ox  O2 Device (S)  HFNC  SpO2 100 %  O2 Therapy Oxygen humidified  O2 Flow Rate (L/min) (S)  6 L/min

## 2024-06-22 NOTE — Progress Notes (Signed)
 ANTICOAGULATION CONSULT NOTE  Pharmacy Consult for heparin  Indication: Afib (Eliquis  PTA), CLI s/p thrombectomy/endarterectomy 02/2024 *hx ICH (cleared by [facility] for anticoagulation 02/2024), GIB 04/2024 s/p repair  Allergies[1]  Patient Measurements: Height: 6' 1 (185.4 cm) Weight: 74.5 kg (164 lb 3.9 oz) IBW/kg (Calculated) : 79.9 Heparin  Dosing Weight: 74 kg   Vital Signs: Temp: 97.6 F (36.4 C) (01/24 1500) Temp Source: Axillary (01/24 1500) BP: 94/56 (01/24 1800) Pulse Rate: 87 (01/24 1800)  Labs: Recent Labs    06/20/24 0346 06/20/24 0346 06/20/24 0507 06/20/24 1652 06/21/24 0336 06/22/24 0502 06/22/24 1812  HGB 7.9*  --   --   --  7.5* 7.2*  --   HCT 25.7*  --   --   --  25.3* 24.6*  --   PLT 247  --   --   --  255 255  --   APTT  --    < > 57*  --  72* 46* 67*  HEPARINUNFRC  --   --  1.10*  --  0.68 0.35  --   CREATININE 1.19  --   --  1.10 0.85 0.75  --    < > = values in this interval not displayed.     Assessment: 89 yoM presents from SNF with respiratory failure now with newly reduced LVEF (30-35%), AF RVR, and possible HCAP vs aspiration PNA..  On Eliquis  PTA for Afib (last dose 1/18 AM).  Pharmacy consulted for heparin  dosing.  Per discussion with CCM 1/19, plan to defer bolus and pursue low-goal with hx of recent GIB, ICH and anemia at baseline.  aPTT therapeutic at 67 seconds.  Goal of Therapy:  Heparin  level 0.3-0.5 units/ml aPTT 66-85 seconds Monitor platelets by anticoagulation protocol: Yes   Plan:  Continue heparin  1500 units/h Daily aPTT, heparin  level, CBC  Ozell Jamaica, PharmD, BCPS, [facility] Clinical Pharmacist [phone] Please check AMION for all [facility] Pharmacy numbers 06/22/2024       [1]  Allergies Allergen Reactions   Ace Inhibitors Anaphylaxis   Bactrim  Ds [Sulfamethoxazole -Trimethoprim ] Shortness Of Breath   Rythmol [Propafenone] Shortness Of Breath   Nitrofurantoin Rash

## 2024-06-22 NOTE — Progress Notes (Signed)
 Speech Language Pathology Treatment: Dysphagia  Patient Details Name: Kyle Kelley MRN: [id number] DOB: 01-12-1946 Today's Date: 06/22/2024 Time: 8480-8454 SLP Time Calculation (min) (ACUTE ONLY): 26 min  Assessment / Plan / Recommendation Clinical Impression  Pt seen for dysphagia tx to assess diet tolerance, trial advanced solids and train in swallow precautions/strategies. Pt, wife and RN report no difficulties with swallowing/diet tolerance since SLP eval 06/21/24. He is taking whole pills in puree well and this is his preference per RN. Upon arrival, pt upright in chair, wife at bedside. Baseline congested coughing noted prior to PO intake with pt expectorating thick yellow mucus into cup. Initial trials of thin liquids by straw (consecutive swallows) were without s/sx of aspiration to follow. He began to have some occasional mild congested coughing after sips of thin as trials progressed, but this was very inconsistent and question if more related to sensation of mucus given acute respiratory infection. He masticated and cleared mechanical soft (peach fruit cup) and regular textured (graham cracker) solids with ease. RR and SpO2 remained stable throughout intake.    PLAN: Recommend advance to regular diet and continue thin liquids. Trained pt in use of small bites/sips, slow rate given use of HFNC and recent acute respiratory distress. Notified RN regarding diet recommendations and encouraging use of small sips/bites at slow rate. RN has not seen pt coughing very much and wife has not noted it either with intake, so will monitor for now. If pt demonstrates increased s/sx with intake (especially thins), contact SLP team. Will f/u.    HPI HPI: Kyle Kelley is a 79 yo male admitted from [facility] with acute hypoxemic respiratory failure. He was intubated on arrival on 1/18 and extubated on 1/21. Pt received on HFNC 35L. CXR (06/22/24) Extensive asymmetric bilateral nodular interstitial and  airspace  infiltrates, more severe within the left lung, in keeping with multifocal  infection, with slight improvement within the left upper lung zone. Question of aspiration component. SLP consulted for clinical swallow assessment. No hx of dysphagia per pt report or chart review. PmHx significant for  frequent hospitalizations since October due to PAD, GI bleed with perforated duodenal ulcer, wound dehiscence with wound vac placement and need for rehab/snf placement at d/c  .      SLP Plan  Continue with current plan of care        Swallow Evaluation Recommendations   Recommendations: PO diet PO Diet Recommendation: Regular;Thin liquids (Level 0) Liquid Administration via: Cup;Straw Medication Administration: Whole meds with puree Supervision: Patient able to self-feed;Intermittent supervision/cueing for swallowing strategies Postural changes: Position pt fully upright for meals Oral care recommendations: Oral care BID (2x/day)     Recommendations                     Oral care BID     Dysphagia, unspecified (R13.10)     Continue with current plan of care      [name], MA, CCC-SLP Acute Rehabilitation Services Office Number: [phone]  [name]  06/22/2024, 3:56 PM

## 2024-06-23 DIAGNOSIS — R739 Hyperglycemia, unspecified: Secondary | ICD-10-CM | POA: Diagnosis not present

## 2024-06-23 DIAGNOSIS — I482 Chronic atrial fibrillation, unspecified: Secondary | ICD-10-CM | POA: Diagnosis not present

## 2024-06-23 DIAGNOSIS — R570 Cardiogenic shock: Secondary | ICD-10-CM | POA: Diagnosis not present

## 2024-06-23 DIAGNOSIS — D649 Anemia, unspecified: Secondary | ICD-10-CM | POA: Diagnosis not present

## 2024-06-23 DIAGNOSIS — E785 Hyperlipidemia, unspecified: Secondary | ICD-10-CM | POA: Diagnosis not present

## 2024-06-23 DIAGNOSIS — I110 Hypertensive heart disease with heart failure: Secondary | ICD-10-CM | POA: Diagnosis not present

## 2024-06-23 DIAGNOSIS — J9601 Acute respiratory failure with hypoxia: Secondary | ICD-10-CM | POA: Diagnosis not present

## 2024-06-23 DIAGNOSIS — I739 Peripheral vascular disease, unspecified: Secondary | ICD-10-CM | POA: Diagnosis not present

## 2024-06-23 DIAGNOSIS — R6521 Severe sepsis with septic shock: Secondary | ICD-10-CM | POA: Diagnosis not present

## 2024-06-23 DIAGNOSIS — J90 Pleural effusion, not elsewhere classified: Secondary | ICD-10-CM

## 2024-06-23 DIAGNOSIS — A419 Sepsis, unspecified organism: Secondary | ICD-10-CM | POA: Diagnosis not present

## 2024-06-23 DIAGNOSIS — E43 Unspecified severe protein-calorie malnutrition: Secondary | ICD-10-CM | POA: Diagnosis not present

## 2024-06-23 DIAGNOSIS — R410 Disorientation, unspecified: Secondary | ICD-10-CM

## 2024-06-23 DIAGNOSIS — I5020 Unspecified systolic (congestive) heart failure: Secondary | ICD-10-CM | POA: Diagnosis not present

## 2024-06-23 LAB — GLUCOSE, BODY FLUID OTHER: Glucose, Body Fluid Other: 116 mg/dL

## 2024-06-23 LAB — BASIC METABOLIC PANEL WITH GFR
Anion gap: 4 — ABNORMAL LOW (ref 5–15)
BUN: 31 mg/dL — ABNORMAL HIGH (ref 8–23)
CO2: 36 mmol/L — ABNORMAL HIGH (ref 22–32)
Calcium: 7.8 mg/dL — ABNORMAL LOW (ref 8.9–10.3)
Chloride: 93 mmol/L — ABNORMAL LOW (ref 98–111)
Creatinine, Ser: 0.71 mg/dL (ref 0.61–1.24)
GFR, Estimated: >60 mL/min
Glucose, Bld: 111 mg/dL — ABNORMAL HIGH (ref 70–99)
Potassium: 4.2 mmol/L (ref 3.5–5.1)
Sodium: 133 mmol/L — ABNORMAL LOW (ref 135–145)

## 2024-06-23 LAB — PROTEIN, BODY FLUID (OTHER): Total Protein, Body Fluid Other: 2 g/dL

## 2024-06-23 LAB — COOXEMETRY PANEL
Carboxyhemoglobin: 1.8 % — ABNORMAL HIGH (ref 0.5–1.5)
Methemoglobin: <0.7 % (ref 0.0–1.5)
O2 Saturation: 65.4 %
Total hemoglobin: 8.2 g/dL — ABNORMAL LOW (ref 12.0–16.0)

## 2024-06-23 LAB — ALBUMIN, FLUID (OTHER): Albumin, Body Fluid Other: 0.9 g/dL

## 2024-06-23 LAB — CBC
HCT: 25.1 % — ABNORMAL LOW (ref 39.0–52.0)
Hemoglobin: 7.6 g/dL — ABNORMAL LOW (ref 13.0–17.0)
MCH: 23.7 pg — ABNORMAL LOW (ref 26.0–34.0)
MCHC: 30.3 g/dL (ref 30.0–36.0)
MCV: 78.2 fL — ABNORMAL LOW (ref 80.0–100.0)
Platelets: 257 10*3/uL (ref 150–400)
RBC: 3.21 MIL/uL — ABNORMAL LOW (ref 4.22–5.81)
RDW: 19.9 % — ABNORMAL HIGH (ref 11.5–15.5)
WBC: 11.3 10*3/uL — ABNORMAL HIGH (ref 4.0–10.5)
nRBC: 0.3 % — ABNORMAL HIGH (ref 0.0–0.2)

## 2024-06-23 LAB — HEPARIN LEVEL (UNFRACTIONATED): Heparin Unfractionated: 0.36 [IU]/mL (ref 0.30–0.70)

## 2024-06-23 LAB — LD, BODY FLUID (OTHER): LD, Body Fluid: 417 [IU]/L

## 2024-06-23 LAB — AMYLASE, BODY FLUID (OTHER): Amylase, Body Fluid: 22 U/L

## 2024-06-23 LAB — APTT: aPTT: 68 s — ABNORMAL HIGH (ref 24–36)

## 2024-06-23 LAB — GLUCOSE, CAPILLARY
Glucose-Capillary: 105 mg/dL — ABNORMAL HIGH (ref 70–99)
Glucose-Capillary: 105 mg/dL — ABNORMAL HIGH (ref 70–99)
Glucose-Capillary: 110 mg/dL — ABNORMAL HIGH (ref 70–99)
Glucose-Capillary: 240 mg/dL — ABNORMAL HIGH (ref 70–99)
Glucose-Capillary: 173 mg/dL — ABNORMAL HIGH (ref 70–99)
Glucose-Capillary: 108 mg/dL — ABNORMAL HIGH (ref 70–99)
Glucose-Capillary: 91 mg/dL (ref 70–99)

## 2024-06-23 MED ORDER — INSULIN ASPART 100 UNIT/ML IJ SOLN
0.0000 [IU] | Freq: Three times a day (TID) | INTRAMUSCULAR | Status: DC
  Administered 2024-06-23: 2 [IU] via SUBCUTANEOUS
  Administered 2024-06-24: 1 [IU] via SUBCUTANEOUS
  Administered 2024-06-25: 2 [IU] via SUBCUTANEOUS
  Administered 2024-06-25: 5 [IU] via SUBCUTANEOUS
  Administered 2024-06-28 (×2): 2 [IU] via SUBCUTANEOUS
  Administered 2024-06-29 (×2): 1 [IU] via SUBCUTANEOUS
  Administered 2024-06-29 – 2024-06-30 (×2): 2 [IU] via SUBCUTANEOUS
  Administered 2024-06-30 – 2024-07-01 (×2): 3 [IU] via SUBCUTANEOUS
  Filled 2024-06-23 (×2): qty 1
  Filled 2024-06-23: qty 2
  Filled 2024-06-23 (×2): qty 1
  Filled 2024-06-23 (×2): qty 2
  Filled 2024-06-23: qty 1
  Filled 2024-06-23: qty 5
  Filled 2024-06-23: qty 2
  Filled 2024-06-23 (×2): qty 1
  Filled 2024-06-23: qty 5

## 2024-06-23 MED ORDER — FUROSEMIDE 10 MG/ML IJ SOLN
120.0000 mg | Freq: Four times a day (QID) | INTRAVENOUS | Status: AC
  Administered 2024-06-23 (×3): 120 mg via INTRAVENOUS
  Filled 2024-06-23: qty 10
  Filled 2024-06-23: qty 2
  Filled 2024-06-23: qty 10

## 2024-06-23 MED ORDER — METOLAZONE 2.5 MG PO TABS
2.5000 mg | ORAL_TABLET | Freq: Once | ORAL | Status: AC
  Administered 2024-06-23: 2.5 mg via ORAL
  Filled 2024-06-23: qty 1

## 2024-06-23 NOTE — Progress Notes (Signed)
 ANTICOAGULATION CONSULT NOTE  Pharmacy Consult for heparin  Indication: Afib (Eliquis  PTA), CLI s/p thrombectomy/endarterectomy 02/2024 *hx ICH (cleared by [facility] for anticoagulation 02/2024), GIB 04/2024 s/p repair  Allergies[1]  Patient Measurements: Height: 6' 1 (185.4 cm) Weight: 79.3 kg (174 lb 13.2 oz) IBW/kg (Calculated) : 79.9 Heparin  Dosing Weight: 74 kg   Vital Signs: Temp: 98.2 F (36.8 C) (01/25 0354) Temp Source: Oral (01/25 0354) BP: 98/60 (01/25 0700) Pulse Rate: 84 (01/25 0700)  Labs: Recent Labs    06/21/24 0336 06/22/24 0502 06/22/24 1812 06/23/24 0431  HGB 7.5* 7.2*  --  7.6*  HCT 25.3* 24.6*  --  25.1*  PLT 255 255  --  257  APTT 72* 46* 67* 68*  HEPARINUNFRC 0.68 0.35  --  0.36  CREATININE 0.85 0.75  --  0.71     Assessment: 22 yoM presents from SNF with respiratory failure now with newly reduced LVEF (30-35%), AF RVR, and possible HCAP vs aspiration PNA..  On Eliquis  PTA for Afib (last dose 1/18 AM).  Pharmacy consulted for heparin  dosing.  Per discussion with CCM 1/19, plan to defer bolus and pursue low-goal with hx of recent GIB, ICH and anemia at baseline.  aPTT 68s is therapeutic with heparin  running at 1500 units/hr. Heparin  level 0.36 is now correlating. Hgb (7.6) and PLTs (257) are stable. Per RN, no report of pauses, issues with the line, or signs of bleeding.   Goal of Therapy:  Heparin  level 0.3-0.5 units/ml aPTT 66-85 seconds  Monitor platelets by anticoagulation protocol: Yes   Plan:  Continue heparin  at 1500 units/hr Daily heparin  level, CBC  Thank you for allowing pharmacy to be a part of this patients care.   Nidia Schaffer, PharmD, BCPS PGY2 Cardiology Pharmacy Resident  Please check AMION for all [facility] Pharmacy phone numbers After 10:00 PM, call Main Pharmacy [phone] 06/23/2024    [1]  Allergies Allergen Reactions   Ace Inhibitors Anaphylaxis   Bactrim  Ds [Sulfamethoxazole -Trimethoprim ] Shortness Of Breath    Rythmol [Propafenone] Shortness Of Breath   Nitrofurantoin Rash

## 2024-06-23 NOTE — Progress Notes (Signed)
 "    Advanced Heart Failure Rounding Note  Cardiologist: Wilbert Bihari, MD  Chief Complaint: cardiogenic shock Subjective:    Decent diuresis on IV lasix . Says he feels much better today   Remains on DBA at 5. Co-ox 72%  CVP 12   Objective:   Weight Range: 79.3 kg Body mass index is 23.07 kg/m.   Vital Signs:   Temp:  [97.4 F (36.3 C)-98.2 F (36.8 C)] 98.2 F (36.8 C) (01/25 0354) Pulse Rate:  [80-96] 92 (01/25 0900) Resp:  [15-25] 23 (01/25 0900) BP: (87-111)/(45-70) 102/59 (01/25 0900) SpO2:  [76 %-100 %] 96 % (01/25 0900) FiO2 (%):  [45 %] 45 % (01/24 1332) Weight:  [79.3 kg] 79.3 kg (01/25 0500) Last BM Date : 06/22/24  Weight change: Filed Weights   06/21/24 0412 06/22/24 0500 06/23/24 0500  Weight: 76.1 kg 74.5 kg 79.3 kg    Intake/Output:   Intake/Output Summary (Last 24 hours) at 06/23/2024 1014 Last data filed at 06/23/2024 0700 Gross per 24 hour  Intake 1454.43 ml  Output 1900 ml  Net -445.57 ml      Physical Exam    General:  Sitting up in bed. Frail cachetic No resp difficulty HEENT: normal Neck: supple. JVP to jaw  Cor: Irregular rate & rhythm. 2/6 AS Lungs: decreased at bases  Abdomen: soft, nontender, nondistended.Good bowel sounds. Extremities: no cyanosis, clubbing, rash, 2-3+ edema  LLE wrapped Neuro: alert & orientedx3, cranial nerves grossly intact. moves all 4 extremities w/o difficulty. Affect pleasant   Telemetry   Afib 90s Personally reviewed  Medications:     Scheduled Medications:  ascorbic acid   500 mg Oral Daily   Chlorhexidine  Gluconate Cloth  6 each Topical Daily   clopidogrel   75 mg Oral Daily   collagenase    Topical Daily   famotidine   20 mg Oral QHS   feeding supplement  237 mL Oral TID BM   insulin  aspart  0-9 Units Subcutaneous Q4H   ipratropium-albuterol   3 mL Nebulization Q6H   lactulose   20 g Oral TID   multivitamin with minerals  1 tablet Oral Daily   polyethylene glycol  17 g Oral BID    senna-docusate  2 tablet Oral QHS   sodium chloride  flush  10-40 mL Intracatheter Q12H   thiamine   100 mg Oral Daily   zinc  sulfate (50mg  elemental zinc )  220 mg Oral Daily    Infusions:  amiodarone  60 mg/hr (06/23/24 0700)   cefTRIAXone  (ROCEPHIN )  IV Stopped (06/22/24 2005)   DOBUTamine  5 mcg/kg/min (06/23/24 0700)   heparin  1,500 Units/hr (06/23/24 0700)   norepinephrine  (LEVOPHED ) Adult infusion Stopped (06/21/24 0001)    PRN Medications: haloperidol  lactate, ipratropium-albuterol , ondansetron  (ZOFRAN ) IV, sodium chloride  flush    Patient Profile   Kyle Kelley is a 79 y.o. male with a history of prostate cancer, CVA, multiple hospitalizations secondary to PAD, GI bleed perforated duodenal ulcer, wound dehiscence requiring skilled placement. Here with mixed shock.  Assessment/Plan   Mixed shock - cardiogenic/septic - Likely aspiration PNA and low coox with IV DBA at 5 - Echo 06/17/24 EF 30-35% - Echo 06/21/24 EF 40-45% - Suspect he has severe underlying CAD but no angina and not CABG candidate so medical therapy likely most appropriate at this time. Can consider ischemic eval down the road if significant recovery, but doubt it will change management - Palliative care consult has been placed - Remains volume overloaded. Continue IV lasix  - On DBA 5. Co-ox 72% (failed wean  earlier this admit). Will turn DBA to 3.    Afib RVR on admit - Permanent A fib  - Rate elevated due to acute illness. Improved with iv amio. Will continue - Continue heparin  drip + amio drip.   Acute Hypoxic Respirator Failure HCAP versus Aspiration.  -Intubated on arrival.  -Extubated 06/19/24 - improving with diuresis - will continue   Anemia   -HX of GI Bleed 05/04/24 EGD showing duodena ulcer - Hx of perforated duodenal ulcer-taken to OR on 12/9 s/p ex-lap, graham patch repair.  -hg 7.6 today - CCM managing   PAD , Ischemic Leg in 02/2024- s/p left tibioperoneal trunk endarterectomy with vein  patch angioplasty, and stenting of left SFA-  -LE DVT S/P Thrombectomy  -LLE full thickness wound. Continue wound care.    Aortic Stenosis  -Moderate previous ECHO. Calcification.  -Based on echo is not severe   Pleural Effusion,Bilateral  - s/p left thoracentesis on 1/23 with 1200 cc out  DNR/DNI - treat the treatable. No intubation or CPR - Palliative care consultation given mutiple comorbid medical conditions, severe frailty and malnutrition  CRITICAL CARE Performed by: Toribio Fuel   Total critical care time: 31 minutes  Critical care time was exclusive of separately billable procedures and treating other patients.  Critical care was necessary to treat or prevent imminent or life-threatening deterioration.  Critical care was time spent personally by me on the following activities: development of treatment plan with patient and/or surrogate as well as nursing, discussions with consultants, evaluation of patient's response to treatment, examination of patient, obtaining history from patient or surrogate, ordering and performing treatments and interventions, ordering and review of laboratory studies, ordering and review of radiographic studies, pulse oximetry and re-evaluation of patient's condition.   Length of Stay: 7  Toribio Fuel, MD  06/23/2024, 10:14 AM  Advanced Heart Failure Team Pager [phone] (M-F; 7a - 5p)   Please visit [email or web address]: For overnight coverage please call cardiology fellow first. If fellow not available call Shock/ECMO MD on call.  For ECMO / Mechanical Support (Impella, IABP, LVAD) issues call Shock / ECMO MD on call.    "

## 2024-06-23 NOTE — Progress Notes (Signed)
 "  NAME:  Kyle Kelley, MRN:  [id number], DOB:  1945-11-19, LOS: 7 ADMISSION DATE:  06/16/2024, CONSULTATION DATE:  06/16/24 REFERRING MD:  EDP, CHIEF COMPLAINT:  acute hypoxic resp failure   History of Present Illness:  79 yo male was reportedly in normal state of health at rehab facility until yesterday afternoon/evening. Pt has complex medical history with frequent hospitalizations since October 2/2 PAD, GI bleed with perforated duodenal ulcer, wound dehiscence with wound vac placement and need for rehab/snf placement at d/c  .   All history is obtained from chart review and wife at bedside as pt is intubated and sedated.   Per wife pt was participating in rehab yesterday, walking, eating overall feeling well yesterday. In evening he did not eat much and went to bed. Unclear about events of today, per her however tonight he presented to [facility] via EMS after being found in resp distress. EMS found him unresponsive with diffuse wheezing. He was recently started on diuretics 2/2 increased edema since surgery. He was also in afib with rvr. Upon arrival to ed he was unresponsive and emergently intubated for hypoxia and airway protection. He was hypotensive unresponsive to fluids and started on levo escalated up to 25mcg at this time  Ccm asked to admit for resp failure and shock. Wife requests full code for now however expressed understanding of critical state, indicated would not want long term support.  Pertinent  Medical History  Afib on chronic a/c Pad with chronic LLE non healing wound Htn Hyperlipidemia Ao stenosis H/o cva H/o prostate cancer H/o LLE arterial occlusion with critical limb ischemia 10/25, s/p thrombectomy H/o GI bleed 2/2 duodenal ulcer 12/25 H/o perforated small bowel s/p Arlyss patch repair and lengthy TPN  Significant Hospital Events: Including procedures, antibiotic start and stop dates in addition to other pertinent events   Admitted to ICU 06/16/24 1/19 Intubated,  sedated, Afib RVR, on pressors  1/20 Pressor requirements improving, Afib 120s-130s, now following commands, still no BM- SMOG enema given. Stress dose steroids and flagyl  stopped 1/21 remains in A-fib but rate controlled.  Failed SBT.  Culture data still negative.  Added dobutamine  1/22 awake with stable respiratory status post extubation 1/23 remains on dobutamine  and HFNC, off pressors. Thora on left- 1200cc removed, not fully evacuated.   Interim History / Subjective:  Denies complaints. No SOB.  Objective    Blood pressure (!) 102/59, pulse 92, temperature 98.2 F (36.8 C), temperature source Oral, resp. rate (!) 23, height 6' 1 (1.854 m), weight 79.3 kg, SpO2 96%. CVP:  [7 mmHg-14 mmHg] 7 mmHg  FiO2 (%):  [45 %] 45 %   Intake/Output Summary (Last 24 hours) at 06/23/2024 1251 Last data filed at 06/23/2024 0700 Gross per 24 hour  Intake 1454.43 ml  Output 1600 ml  Net -145.57 ml   Filed Weights   06/21/24 0412 06/22/24 0500 06/23/24 0500  Weight: 76.1 kg 74.5 kg 79.3 kg    General: frail, chronically ill appearing man sitting up in bed, lethargic HEENT: temporal wasting Neuro: awake, confused, asking repetitive questions today, moving extremities CV: s1S2, RRR PULM: reduced breath sounds in bases, clear anteriorly. Breathing comfortably on salter. No conversational dyspnea.  GI: soft, NT Extremities: muscle wasting, significant edema  I/O -600cc, net + 560cc for admission  Coox 66% Na+  133 K+ 4.2 BUN 31 Cr 0.71 WBC 11.3 H/H 7.6/25.1 Platelets 257   Pleural fluid 2276 WBC, 85% PMNs Pleural fluid culture: NGTD  Resolved problem list  Constipation  Assessment and Plan   Acute hypoxic respiratory failure secondary to secondary to HAP vs aspiration concurrent with a component of pulmonary edema from CHF MRSA negative -completed 7 days of antibiotics, monitor off antibiotics -aggressive diuresis -wean O2 -may eventually need R thora, but needs to try  diuresis first -pulmonary hygiene, mobility as able  Mixed Shock- Septic now Cardiogenic component  Acute HFrEF 30-35%, RV normal AS -serial coox, con't DBA 5 -aggressive diuresis-- lasix  120mg  x 3 doses today + metolazone  -goal MAP >65 -monitoring off antibiotics  Afib RVR -hx of chronic afib on eliquis    -heparin  gtt, IV amio with dBA  AKI> resolved; suspect was due to sepsis -strict I/O -renally dose meds, avoid nephrotoxic meds -con't foley  Acute on Chronic Anemia Hx of GI bleed- 05/04/24-EGD showing duodenal ulcer Hx of perforated duodenal ulcer-taken to OR on 12/9 s/p ex-lap, graham patch repeair  -monitor for bleeding -transfuse for Hb <7 or hemodynamically significant bleeding  Elevated LFTs- Suspect shock liver and congestive hepatopathy -diuresis -trend  RLE edema, asymmetric--- he reports chronic H/o severe PAD w/ Recent LE DVT s/p Thrombectomy on left Acute Ischemic Leg in 02/2024- s/p left tibioperoneal trunk endarterectomy with vein patch angioplasty, and stenting of left SFA- on plavix  PTA  -heparin  gtt -con't plavix  -had negative DVT study 05/16/2024 on right lower extremity and has been on anticoagulation since so low for DVT at this point  H/o HTN & HLD  -con't to hold antihypertensives  Remote H/o ICH -monitor on AC  Hyperglycemia -SSI PRN-- minimal requiements -goal BG 140-180  Severe protein energy malnutrition Dysphagia -reg diet  Acute encephalopathy, suspect ICU delirium -delirium precautions  This patient is critically ill with multiple organ system failure which requires frequent high complexity decision making, assessment, support, evaluation, and titration of therapies. This was completed through the application of advanced monitoring technologies and extensive interpretation of multiple databases. During this encounter critical care time was devoted to patient care services described in this note for 36 minutes.  [name], DO  06/23/24 7:45 PM [facility]  For contact information, see Amion. If no response to pager, please call PCCM 2H APP. After hours, 7PM- 7AM, please call on call APP for 2H.  "

## 2024-06-24 DIAGNOSIS — I480 Paroxysmal atrial fibrillation: Secondary | ICD-10-CM | POA: Diagnosis not present

## 2024-06-24 DIAGNOSIS — E43 Unspecified severe protein-calorie malnutrition: Secondary | ICD-10-CM | POA: Diagnosis not present

## 2024-06-24 DIAGNOSIS — J9601 Acute respiratory failure with hypoxia: Secondary | ICD-10-CM | POA: Diagnosis not present

## 2024-06-24 DIAGNOSIS — R570 Cardiogenic shock: Secondary | ICD-10-CM | POA: Diagnosis not present

## 2024-06-24 DIAGNOSIS — A414 Sepsis due to anaerobes: Secondary | ICD-10-CM | POA: Diagnosis not present

## 2024-06-24 DIAGNOSIS — I5020 Unspecified systolic (congestive) heart failure: Secondary | ICD-10-CM | POA: Diagnosis not present

## 2024-06-24 DIAGNOSIS — J690 Pneumonitis due to inhalation of food and vomit: Secondary | ICD-10-CM | POA: Diagnosis not present

## 2024-06-24 DIAGNOSIS — I110 Hypertensive heart disease with heart failure: Secondary | ICD-10-CM | POA: Diagnosis not present

## 2024-06-24 DIAGNOSIS — R6521 Severe sepsis with septic shock: Secondary | ICD-10-CM | POA: Diagnosis not present

## 2024-06-24 DIAGNOSIS — I739 Peripheral vascular disease, unspecified: Secondary | ICD-10-CM | POA: Diagnosis not present

## 2024-06-24 DIAGNOSIS — D649 Anemia, unspecified: Secondary | ICD-10-CM | POA: Diagnosis not present

## 2024-06-24 DIAGNOSIS — E785 Hyperlipidemia, unspecified: Secondary | ICD-10-CM | POA: Diagnosis not present

## 2024-06-24 LAB — BODY FLUID CULTURE W GRAM STAIN: Culture: NO GROWTH

## 2024-06-24 LAB — GLUCOSE, CAPILLARY
Glucose-Capillary: 103 mg/dL — ABNORMAL HIGH (ref 70–99)
Glucose-Capillary: 96 mg/dL (ref 70–99)
Glucose-Capillary: 91 mg/dL (ref 70–99)
Glucose-Capillary: 122 mg/dL — ABNORMAL HIGH (ref 70–99)
Glucose-Capillary: 88 mg/dL (ref 70–99)

## 2024-06-24 LAB — BASIC METABOLIC PANEL WITH GFR
Anion gap: 6 (ref 5–15)
Anion gap: 6 (ref 5–15)
BUN: 29 mg/dL — ABNORMAL HIGH (ref 8–23)
BUN: 29 mg/dL — ABNORMAL HIGH (ref 8–23)
CO2: 41 mmol/L — ABNORMAL HIGH (ref 22–32)
CO2: 41 mmol/L — ABNORMAL HIGH (ref 22–32)
Calcium: 7.5 mg/dL — ABNORMAL LOW (ref 8.9–10.3)
Calcium: 7.9 mg/dL — ABNORMAL LOW (ref 8.9–10.3)
Chloride: 87 mmol/L — ABNORMAL LOW (ref 98–111)
Chloride: 85 mmol/L — ABNORMAL LOW (ref 98–111)
Creatinine, Ser: 0.6 mg/dL — ABNORMAL LOW (ref 0.61–1.24)
Creatinine, Ser: 0.78 mg/dL (ref 0.61–1.24)
GFR, Estimated: >60 mL/min
GFR, Estimated: >60 mL/min
Glucose, Bld: 106 mg/dL — ABNORMAL HIGH (ref 70–99)
Glucose, Bld: 186 mg/dL — ABNORMAL HIGH (ref 70–99)
Potassium: 3 mmol/L — ABNORMAL LOW (ref 3.5–5.1)
Potassium: 4 mmol/L (ref 3.5–5.1)
Sodium: 133 mmol/L — ABNORMAL LOW (ref 135–145)
Sodium: 132 mmol/L — ABNORMAL LOW (ref 135–145)

## 2024-06-24 LAB — HEPARIN LEVEL (UNFRACTIONATED): Heparin Unfractionated: 0.31 [IU]/mL (ref 0.30–0.70)

## 2024-06-24 LAB — MAGNESIUM: Magnesium: 1.6 mg/dL — ABNORMAL LOW (ref 1.7–2.4)

## 2024-06-24 LAB — COOXEMETRY PANEL
Carboxyhemoglobin: 1.5 % (ref 0.5–1.5)
Methemoglobin: 0.8 % (ref 0.0–1.5)
O2 Saturation: 67.3 %
Total hemoglobin: 8.3 g/dL — ABNORMAL LOW (ref 12.0–16.0)

## 2024-06-24 LAB — CBC
HCT: 26.8 % — ABNORMAL LOW (ref 39.0–52.0)
Hemoglobin: 7.8 g/dL — ABNORMAL LOW (ref 13.0–17.0)
MCH: 22.8 pg — ABNORMAL LOW (ref 26.0–34.0)
MCHC: 29.1 g/dL — ABNORMAL LOW (ref 30.0–36.0)
MCV: 78.4 fL — ABNORMAL LOW (ref 80.0–100.0)
Platelets: 296 10*3/uL (ref 150–400)
RBC: 3.42 MIL/uL — ABNORMAL LOW (ref 4.22–5.81)
RDW: 20 % — ABNORMAL HIGH (ref 11.5–15.5)
WBC: 12.4 10*3/uL — ABNORMAL HIGH (ref 4.0–10.5)
nRBC: 0 % (ref 0.0–0.2)

## 2024-06-24 LAB — APTT: aPTT: 61 s — ABNORMAL HIGH (ref 24–36)

## 2024-06-24 MED ORDER — MAGNESIUM SULFATE 4 GM/100ML IV SOLN
4.0000 g | Freq: Once | INTRAVENOUS | Status: AC
  Administered 2024-06-24: 4 g via INTRAVENOUS
  Filled 2024-06-24: qty 100

## 2024-06-24 MED ORDER — POTASSIUM CHLORIDE CRYS ER 20 MEQ PO TBCR
40.0000 meq | EXTENDED_RELEASE_TABLET | ORAL | Status: DC
  Administered 2024-06-24: 40 meq via ORAL
  Filled 2024-06-24: qty 2

## 2024-06-24 MED ORDER — FUROSEMIDE 10 MG/ML IJ SOLN
10.0000 mg/h | INTRAVENOUS | Status: AC
  Administered 2024-06-24: 10 mg/h via INTRAVENOUS
  Filled 2024-06-24: qty 20

## 2024-06-24 MED ORDER — SPIRONOLACTONE 12.5 MG HALF TABLET
12.5000 mg | ORAL_TABLET | Freq: Every day | ORAL | Status: DC
  Administered 2024-06-24 – 2024-06-25 (×2): 12.5 mg via ORAL
  Filled 2024-06-24 (×3): qty 1

## 2024-06-24 MED ORDER — SODIUM CHLORIDE 0.9 % IV SOLN
2.0000 g | INTRAVENOUS | Status: DC
  Administered 2024-06-24 – 2024-06-27 (×4): 2 g via INTRAVENOUS
  Filled 2024-06-24 (×4): qty 20

## 2024-06-24 MED ORDER — POTASSIUM CHLORIDE 20 MEQ PO PACK
20.0000 meq | PACK | Freq: Once | ORAL | Status: AC
  Administered 2024-06-24: 20 meq via ORAL
  Filled 2024-06-24: qty 1

## 2024-06-24 MED ORDER — POTASSIUM CHLORIDE 20 MEQ PO PACK
60.0000 meq | PACK | ORAL | Status: AC
  Administered 2024-06-24 (×2): 60 meq via ORAL
  Filled 2024-06-24 (×2): qty 3

## 2024-06-24 MED ORDER — DIGOXIN 125 MCG PO TABS
0.0625 mg | ORAL_TABLET | Freq: Every day | ORAL | Status: DC
  Administered 2024-06-24 – 2024-07-01 (×8): 0.0625 mg via ORAL
  Filled 2024-06-24 (×8): qty 1

## 2024-06-24 MED ORDER — FUROSEMIDE 10 MG/ML IJ SOLN
80.0000 mg | Freq: Once | INTRAMUSCULAR | Status: AC
  Administered 2024-06-24: 80 mg via INTRAVENOUS
  Filled 2024-06-24: qty 8

## 2024-06-24 NOTE — Progress Notes (Signed)
 Physical Therapy Treatment Patient Details Name: Kyle Kelley MRN: [id number] DOB: [birth year] Today's Date: 06/24/2024   History of Present Illness 79 yo male presents from Rehab Facility 06/16/2024 for respiratory distress intubated on arrival.  Severe L-sided aspiration PNA and resultant septic shock.  PMHx: atrial fibrillation (on Eliquis /recently held), vasculopath, acute on chronic ischemic LLE nonhealing wound, HTN, HLD, aortic stenosis, CVA, prostate cancer s/p prostatectomy 2014, and PVD.    PT Comments  Currently pt is presenting at [facility] to Mod A for sit to stand and can only stand briefly with Rw due to progressively increasing posterior lean in standing. Pt with weakness in bil LE requiring Atleast Min to Mod A to remain standing with no significant buckling but progressing crouching with UE support on RW. Due to pt current functional status, home set up and available assistance at home recommending skilled physical therapy services < 3 hours/day in order to address strength, balance and functional mobility to decrease risk for falls, injury, immobility, skin break down and re-hospitalization.      If plan is discharge home, recommend the following: A lot of help with walking and/or transfers;A lot of help with bathing/dressing/bathroom;Assistance with cooking/housework;Direct supervision/assist for medications management;Direct supervision/assist for financial management;Assist for transportation;Help with stairs or ramp for entrance;Supervision due to cognitive status   Can travel by private vehicle     No  Equipment Recommendations  Hospital bed;Wheelchair (measurements PT);Wheelchair cushion (measurements PT);BSC/3in1       Precautions / Restrictions Precautions Precautions: Fall Recall of Precautions/Restrictions: Intact Restrictions Weight Bearing Restrictions Per Provider Order: No     Mobility  Bed Mobility     General bed mobility comments: up in recliner on arrival  and departure.    Transfers Overall transfer level: Needs assistance Equipment used: Rolling walker (2 wheels) Transfers: Sit to/from Stand Sit to Stand: Mod assist, Min assist           General transfer comment: Mod A for 2 attempts and Min A for 2 attempts heavy cues for sequencing and safety.    Ambulation/Gait     General Gait Details: unable to attempt today due to weakness.     Balance Overall balance assessment: Needs assistance Sitting-balance support: Bilateral upper extremity supported, Feet supported Sitting balance-Leahy Scale: Fair Sitting balance - Comments: continues with L lateral lean in sitting to off load R buttocks Postural control: Posterior lean Standing balance support: Bilateral upper extremity supported, During functional activity, Reliant on assistive device for balance Standing balance-Leahy Scale: Poor Standing balance comment: increasingly heavy posterior lean in standing with RW.          Communication Communication Communication: Impaired  Cognition Arousal: Alert Behavior During Therapy: WFL for tasks assessed/performed   PT - Cognitive impairments: Safety/Judgement, Problem solving, Sequencing     PT - Cognition Comments: Pt at one point asking if his wife was in the hall and wanting to look behind the curtain. Spouse was no where around but pt seemed to accept this unsure if this was a cognitive issue or not. Overall following directions well and aware of weakness. Following commands: Intact      Cueing Cueing Techniques: Verbal cues, Gestural cues, Tactile cues     General Comments General comments (skin integrity, edema, etc.): Vital signs stable during session. Pt has soft BP from beginning to end of session      Pertinent Vitals/Pain Pain Assessment Pain Assessment: No/denies pain Pain Location: Pt seemed a little uncomfortable sitting down with geomat under  him in recliner asking to shift and was assist with  adjusting. Pain Intervention(s): Monitored during session           PT Goals (current goals can now be found in the care plan section) Acute Rehab PT Goals PT Goal Formulation: With patient/family Time For Goal Achievement: 07/04/24 Potential to Achieve Goals: Fair Progress towards PT goals: Progressing toward goals    Frequency    Min 2X/week      PT Plan  Continue with current POC        AM-PAC PT 6 Clicks Mobility   Outcome Measure  Help needed turning from your back to your side while in a flat bed without using bedrails?: A Lot Help needed moving from lying on your back to sitting on the side of a flat bed without using bedrails?: A Lot Help needed moving to and from a bed to a chair (including a wheelchair)?: A Lot Help needed standing up from a chair using your arms (e.g., wheelchair or bedside chair)?: A Lot Help needed to walk in hospital room?: Total Help needed climbing 3-5 steps with a railing? : Total 6 Click Score: 10    End of Session Equipment Utilized During Treatment: Gait belt;Oxygen Activity Tolerance: Patient limited by fatigue Patient left: in chair;with call bell/phone within reach;with chair alarm set Nurse Communication: Mobility status PT Visit Diagnosis: Other abnormalities of gait and mobility (R26.89);Unsteadiness on feet (R26.81);Muscle weakness (generalized) (M62.81);Difficulty in walking, not elsewhere classified (R26.2);Adult, failure to thrive (R62.7);Pain     Time: 8661-8642 PT Time Calculation (min) (ACUTE ONLY): 19 min  Charges:    $Therapeutic Activity: 8-22 mins PT General Charges $$ ACUTE PT VISIT: 1 Visit                    Dorothyann Maier, DPT, CLT  Acute Rehabilitation Services Office: [phone] (Secure chat preferred)    Dorothyann VEAR Maier 06/24/2024, 2:35 PM

## 2024-06-24 NOTE — [place] FL2 (Signed)
 " [place]  MEDICAID FL2 LEVEL OF CARE FORM     IDENTIFICATION  Patient Name: Kyle Kelley Birthdate: Mar 03, 1946 Sex: male Admission Date (Current Location): 06/16/2024  [facility] and [place] Number:  Producer, Television/film/video and Address:  [employer], [address], [place], [place] [address]      Provider Number: [id number]  Attending Physician Name and Address:  Gretta Leita SQUIBB, DO  Relative Name and Phone Number:  Carlis Blanchard (spouse) [phone]    Current Level of Care: Hospital Recommended Level of Care: Skilled Nursing Facility Prior Approval Number:    Date Approved/Denied:   PASRR Number: [id number]  Discharge Plan: SNF    Current Diagnoses: Patient Active Problem List   Diagnosis Date Noted   Delirium 06/23/2024   Pleural effusion, right 06/23/2024   Cardiogenic shock (HCC) 06/23/2024   Acute respiratory failure with hypoxia (HCC) 06/23/2024   Acute hypoxemic respiratory failure (HCC) 06/16/2024   Protein-calorie malnutrition, severe 05/14/2024   Symptomatic anemia 05/03/2024   Critical limb ischemia of left lower extremity (HCC) 03/26/2024   Critical lower limb ischemia (HCC) 03/26/2024   History of stroke without residual deficits 03/25/2024   Mixed hyperlipidemia 03/25/2024   Status post surgery 03/25/2024   ICH (intracerebral hemorrhage) (HCC) 02/28/2024   Aortic stenosis    Carotid artery stenosis    Dizziness 09/02/2020   Tobacco abuse 09/02/2020   Parotid mass 09/02/2020   Atrial fibrillation (HCC)    Malignant neoplasm of prostate (HCC) 09/05/2016   Essential hypertension, benign 05/08/2013    Orientation RESPIRATION BLADDER Height & Weight     Self, Time, Situation, Place  O2 (5 liters HFNC) Incontinent, External catheter (External Urinary Catheter) Weight: 157 lb 13.6 oz (71.6 kg) Height:  6' 1 (185.4 cm)  BEHAVIORAL SYMPTOMS/MOOD NEUROLOGICAL BOWEL NUTRITION STATUS      Incontinent Diet (Please see discharge  summary)  AMBULATORY STATUS COMMUNICATION OF NEEDS Skin   Extensive Assist Verbally Other (Comment) (Abrasion,Arm,R,L,Ecchymosis,Arm,Leg,Bil.,Erythema,Sacrum,Bil.,Wound/Incision LDAs,Wound [employer] closed Inc.Abdomen,medial,upper,Wound,Surgical open incision,pretibial,L,medical,Wound PI sacrum mid unstageable)                       Personal Care Assistance Level of Assistance  Bathing, Feeding, Dressing Bathing Assistance: Maximum assistance Feeding assistance: Independent Dressing Assistance: Maximum assistance     Functional Limitations Info  Sight, Hearing, Speech Sight Info: Adequate Hearing Info: Adequate Speech Info: Adequate    SPECIAL CARE FACTORS FREQUENCY  PT (By licensed PT), OT (By licensed OT)     PT Frequency: 5x min weekly OT Frequency: 5x min weekly            Contractures Contractures Info: Not present    Additional Factors Info  Code Status, Allergies Code Status Info: DNR Allergies Info: Ace Inhibitors,Bactrim  Ds (sulfamethoxazole -trimethoprim ),Rythmol (propafenone),Nitrofurantoin           Current Medications (06/24/2024):  This is the current hospital active medication list Current Facility-Administered Medications  Medication Dose Route Frequency Provider Last Rate Last Admin   amiodarone  (NEXTERONE  PREMIX) 360-4.14 MG/200ML-% (1.8 mg/mL) IV infusion  30 mg/hr Intravenous Continuous Clegg, Amy D, NP 16.67 mL/hr at 06/24/24 0757 30 mg/hr at 06/24/24 0757   ascorbic acid  (VITAMIN C ) tablet 500 mg  500 mg Oral Daily Stretch, Robert J, MD   500 mg at 06/24/24 1100   cefTRIAXone  (ROCEPHIN ) 2 g in sodium chloride  0.9 % 100 mL IVPB  2 g Intravenous Q24H Gretta Leita P, DO 200 mL/hr at 06/24/24 1227 2  g at 06/24/24 1227   Chlorhexidine  Gluconate Cloth 2 % PADS 6 each  6 each Topical Daily Layman Raisin, DO   6 each at 06/24/24 0950   clopidogrel  (PLAVIX ) tablet 75 mg  75 mg Oral Daily Marten Larraine HERO, RPH   75 mg at 06/24/24 9051   collagenase   (SANTYL ) ointment   Topical Daily Layman Raisin, DO   1 Application at 06/24/24 9050   digoxin  (LANOXIN ) tablet 0.0625 mg  0.0625 mg Oral Daily McLean, Dalton S, MD   0.0625 mg at 06/24/24 9051   DOBUTamine  (DOBUTREX ) infusion 4000 mcg/mL  3 mcg/kg/min Intravenous Continuous Bensimhon, Daniel R, MD 3.38 mL/hr at 06/24/24 0600 3 mcg/kg/min at 06/24/24 0600   famotidine  (PEPCID ) tablet 20 mg  20 mg Oral QHS Marten Larraine HERO, RPH   20 mg at 06/23/24 2145   feeding supplement (ENSURE PLUS HIGH PROTEIN) liquid 237 mL  237 mL Oral TID BM Stretch, Lamar PARAS, MD   237 mL at 06/24/24 0950   furosemide  (LASIX ) 200 mg in dextrose  5 % 100 mL (2 mg/mL) infusion  10 mg/hr Intravenous Continuous Rolan Ezra RAMAN, MD 5 mL/hr at 06/24/24 0946 10 mg/hr at 06/24/24 0946   haloperidol  lactate (HALDOL ) injection 5 mg  5 mg Intravenous Q6H PRN Stretch, Robert J, MD   2.5 mg at 06/18/24 2144   heparin  ADULT infusion 100 units/mL (25000 units/250mL)  1,500 Units/hr Intravenous Continuous Salam, Savannah B, RPH 15 mL/hr at 06/24/24 0757 1,500 Units/hr at 06/24/24 0757   insulin  aspart (novoLOG ) injection 0-9 Units  0-9 Units Subcutaneous TID AC & HS Gretta Leita SQUIBB, DO   2 Units at 06/23/24 1731   ipratropium-albuterol  (DUONEB) 0.5-2.5 (3) MG/3ML nebulizer solution 3 mL  3 mL Nebulization Q6H PRN Layman Raisin, DO   3 mL at 06/17/24 0426   lactulose  (CHRONULAC ) 10 GM/15ML solution 20 g  20 g Oral TID Marten Larraine M, RPH       magnesium  sulfate IVPB 4 g 100 mL  4 g Intravenous Once Paytes, Emma U, RPH 50 mL/hr at 06/24/24 1231 4 g at 06/24/24 1231   multivitamin with minerals tablet 1 tablet  1 tablet Oral Daily Stretch, Robert J, MD   1 tablet at 06/24/24 9051   ondansetron  (ZOFRAN ) injection 4 mg  4 mg Intravenous Q6H PRN Marshall, Jessica, DO       polyethylene glycol (MIRALAX  / GLYCOLAX ) packet 17 g  17 g Oral BID Uhlorn, Garett M, RPH       senna-docusate (Senokot-S) tablet 2 tablet  2 tablet Oral QHS Marten Larraine HERO, RPH   2 tablet at 06/20/24 2100   sodium chloride  flush (NS) 0.9 % injection 10-40 mL  10-40 mL Intracatheter Q12H Layman Raisin, DO   10 mL at 06/24/24 9049   sodium chloride  flush (NS) 0.9 % injection 10-40 mL  10-40 mL Intracatheter PRN Layman Raisin, DO       spironolactone  (ALDACTONE ) tablet 12.5 mg  12.5 mg Oral Daily Clegg, Amy D, NP   12.5 mg at 06/24/24 9051   zinc  sulfate (50mg  elemental zinc ) capsule 220 mg  220 mg Oral Daily Stretch, Robert J, MD   220 mg at 06/24/24 9051     Discharge Medications: Please see discharge summary for a list of discharge medications.  Relevant Imaging Results:  Relevant Lab Results:   Additional Information [id number]  Isaiah Public, LCSWA     "

## 2024-06-24 NOTE — Progress Notes (Addendum)
 "    Advanced Heart Failure Rounding Note  Cardiologist: Wilbert Bihari, MD  Chief Complaint: cardiogenic shock Subjective:   1/25 DBA cut back to 3 mcg. Diuresed with IV lasix .   DBA 3 + Amio 60 + heparin .   CO-OX pending.   Denies SOB.   Objective:   Weight Range: 71.6 kg Body mass index is 20.83 kg/m.   Vital Signs:   Temp:  [98 F (36.7 C)-98.4 F (36.9 C)] 98 F (36.7 C) (01/26 0317) Pulse Rate:  [79-167] 81 (01/26 0645) Resp:  [13-28] 17 (01/26 0645) BP: (95-114)/(45-83) 95/58 (01/26 0645) SpO2:  [82 %-100 %] 100 % (01/26 0645) Weight:  [71.6 kg] 71.6 kg (01/26 0407) Last BM Date : 06/23/24  Weight change: Filed Weights   06/22/24 0500 06/23/24 0500 06/24/24 0407  Weight: 74.5 kg 79.3 kg 71.6 kg    Intake/Output:   Intake/Output Summary (Last 24 hours) at 06/24/2024 0735 Last data filed at 06/24/2024 0600 Gross per 24 hour  Intake 1400.08 ml  Output 3100 ml  Net -1699.92 ml     CVP 6-7  Physical Exam   General:   Appears weak. No resp difficulty Neck: Difficult to assess. RIJ Cor: Irregular rate & rhythm.   Lungs: Decreased with crackles in bases on 5 liters [place] Abdomen: soft, nontender, nondistended.  Extremities: LLE dressing. RLE 2+  edema Neuro: alert & oriented x3   Telemetry   A fib 80-90s   Medications:     Scheduled Medications:  ascorbic acid   500 mg Oral Daily   Chlorhexidine  Gluconate Cloth  6 each Topical Daily   clopidogrel   75 mg Oral Daily   collagenase    Topical Daily   famotidine   20 mg Oral QHS   feeding supplement  237 mL Oral TID BM   insulin  aspart  0-9 Units Subcutaneous TID AC & HS   lactulose   20 g Oral TID   multivitamin with minerals  1 tablet Oral Daily   polyethylene glycol  17 g Oral BID   potassium chloride   40 mEq Oral Q4H   senna-docusate  2 tablet Oral QHS   sodium chloride  flush  10-40 mL Intracatheter Q12H   thiamine   100 mg Oral Daily   zinc  sulfate (50mg  elemental zinc )  220 mg Oral Daily     Infusions:  amiodarone  60 mg/hr (06/24/24 0618)   DOBUTamine  3 mcg/kg/min (06/24/24 0600)   heparin  1,500 Units/hr (06/24/24 0600)   norepinephrine  (LEVOPHED ) Adult infusion Stopped (06/21/24 0001)    PRN Medications: haloperidol  lactate, ipratropium-albuterol , ondansetron  (ZOFRAN ) IV, sodium chloride  flush    Patient Profile   Kyle Kelley is a 79 y.o. male with a history of prostate cancer, CVA, multiple hospitalizations secondary to PAD, GI bleed perforated duodenal ulcer, wound dehiscence requiring skilled placement. Here with mixed shock.  Assessment/Plan   Mixed shock - cardiogenic/septic - Likely aspiration PNA and low coox with IV DBA at 5 - Echo 06/17/24 EF 30-35% - Echo 06/21/24 EF 40-45% - Suspect he has severe underlying CAD but no angina and not CABG candidate so medical therapy likely most appropriate at this time. Can consider ischemic eval down the road if significant recovery, but doubt it will change management - Palliative care consult has been placed - Remains volume overloaded. Give lasix  80 mg IV now. Start lasix  10 mg per hour.  - On DBA 3 mcg. Check CO-OX now.  - K 3. Supp K. Start 12.5 mg spiro.  Afib RVR on admit -  Permanent A fib  - Rate elevated due to acute illness. Improved with iv amio. Will continue - Continue heparin  drip + amio drip. Cut back amio 30 mg per hour.    Acute Hypoxic Respirator Failure HCAP versus Aspiration.  -Intubated on arrival.  -Extubated 06/19/24 - improving with diuresis - will continue   Anemia   -HX of GI Bleed 05/04/24 EGD showing duodena ulcer - Hx of perforated duodenal ulcer-taken to OR on 12/9 s/p ex-lap, graham patch repair.  -hg 7.6>7.8  today - CCM managing   PAD , Ischemic Leg in 02/2024- s/p left tibioperoneal trunk endarterectomy with vein patch angioplasty, and stenting of left SFA-  -LE DVT S/P Thrombectomy  -LLE full thickness wound. Continue wound care.    Aortic Stenosis  -Moderate previous  ECHO. Calcification.  -Based on echo is not severe   Pleural Effusion,Bilateral  - s/p left thoracentesis on 1/23 with 1200 cc out  DNR/DNI - treat the treatable. No intubation or CPR - Palliative care consultation given mutiple comorbid medical conditions, severe frailty and malnutrition    CRITICAL CARE Performed by: Greig Mosses NP-C    Total critical care time:10  minutes  Critical care time was exclusive of separately billable procedures and treating other patients.  Critical care was necessary to treat or prevent imminent or life-threatening deterioration.  Critical care was time spent personally by me on the following activities: development of treatment plan with patient and/or surrogate as well as nursing, discussions with consultants, evaluation of patient's response to treatment, examination of patient, obtaining history from patient or surrogate, ordering and performing treatments and interventions, ordering and review of laboratory studies, ordering and review of radiographic studies, pulse oximetry and re-evaluation of patient's condition.   Length of Stay: 8  Amy Clegg, NP  06/24/2024, 7:35 AM  Advanced Heart Failure Team Pager [phone] (M-F; 7a - 5p)   Please visit [email or web address]: For overnight coverage please call cardiology fellow first. If fellow not available call Shock/ECMO MD on call.  For ECMO / Mechanical Support (Impella, IABP, LVAD) issues call Shock / ECMO MD on call.   Patient seen with NP, I formulated the plan and agree with the above note.   CVP 7 today but has significant peripheral edema.  Co-ox 67% on dobutamine  3. I/Os net negative -1670.   Has been out of bed to chair, has not been walking due to weakness.   Remains in AF, rate is controlled on amiodarone .  On heparin  gtt.   General: NAD Neck: JVP 8 cm, no thyromegaly or thyroid  nodule.  Lungs: Clear to auscultation bilaterally with normal respiratory effort. CV: Nondisplaced PMI.  Heart  regular S1/S2, no S3/S4, 2/6 SEM RUSB.  1+ edema to knees.   Abdomen: Soft, nontender, no hepatosplenomegaly, no distention.  Skin: Intact without lesions or rashes.  Neurologic: Alert and oriented x 3.  Psych: Normal affect. Extremities: No clubbing or cyanosis.  HEENT: Normal.   Co-ox 67% on dobutamine .  Diuresing today.  Will add digoxin  0.0625 and start dobutamine  wean likely tomorrow.   Still with significant peripheral edema though JVP 7 cm. Will continue Lasix  gtt 10 mg/hr today, stop around 8 pm tonight and reassess volume status in am.    Continue heparin  gtt and amiodarone  gtt with permanent AF. Rate is controlled, can decrease amiodarone  to 30 mg/hr.   Needs evaluation by palliative care service.  Poor long-term prognosis.   PNA, abx stopped yesterday.  Had Peptostreptococcus in blood cultures, will plan to  extend antibiotic coverage to 2 wks (ceftriaxone ).    CRITICAL CARE Performed by: Ezra Shuck  Total critical care time: 40 minutes  Critical care time was exclusive of separately billable procedures and treating other patients.  Critical care was necessary to treat or prevent imminent or life-threatening deterioration.  Critical care was time spent personally by me on the following activities: development of treatment plan with patient and/or surrogate as well as nursing, discussions with consultants, evaluation of patient's response to treatment, examination of patient, obtaining history from patient or surrogate, ordering and performing treatments and interventions, ordering and review of laboratory studies, ordering and review of radiographic studies, pulse oximetry and re-evaluation of patient's condition.  Ezra Shuck 06/24/2024 8:45 AM  "

## 2024-06-24 NOTE — Plan of Care (Signed)
   Problem: Coping: Goal: Ability to adjust to condition or change in health will improve Outcome: Progressing   Problem: Fluid Volume: Goal: Ability to maintain a balanced intake and output will improve Outcome: Progressing   Problem: Health Behavior/Discharge Planning: Goal: Ability to identify and utilize available resources and services will improve Outcome: Progressing

## 2024-06-24 NOTE — Progress Notes (Signed)
 ANTICOAGULATION CONSULT NOTE  Pharmacy Consult for heparin  Indication: Afib (Eliquis  PTA), CLI s/p thrombectomy/endarterectomy 02/2024 *hx ICH (cleared by [facility] for anticoagulation 02/2024), GIB 04/2024 s/p repair  Allergies[1]  Patient Measurements: Height: 6' 1 (185.4 cm) Weight: 71.6 kg (157 lb 13.6 oz) IBW/kg (Calculated) : 79.9 Heparin  Dosing Weight: 74 kg   Vital Signs: Temp: 98 F (36.7 C) (01/26 0317) Temp Source: Oral (01/26 0317) BP: 106/72 (01/26 0700) Pulse Rate: 84 (01/26 0700)  Labs: Recent Labs    06/22/24 0502 06/22/24 1812 06/23/24 0431 06/24/24 0400  HGB 7.2*  --  7.6* 7.8*  HCT 24.6*  --  25.1* 26.8*  PLT 255  --  257 296  APTT 46* 67* 68* 61*  HEPARINUNFRC 0.35  --  0.36 0.31  CREATININE 0.75  --  0.71 0.60*     Assessment: 78 yoM presents from SNF with respiratory failure now with newly reduced LVEF (30-35%), AF RVR, and possible HCAP vs aspiration PNA..  On Eliquis  PTA for Afib (last dose 1/18 AM).  Pharmacy consulted for heparin  dosing.  Per discussion with CCM 1/19, plan to defer bolus and pursue low-goal with hx of recent GIB, ICH and anemia at baseline.  aPTT and heparin  level correlating heparin  level i0.31 s therapeutic with heparin  running at 1500 units/hr. Will dose heparin  by heparin  levels now that apixaban  has washed out. Hgb (7.s - stable) and PLTs (200s stable) No  signs of bleeding.   Goal of Therapy:  Heparin  level 0.3-0.5 units/ml aPTT 66-85 seconds  Monitor platelets by anticoagulation protocol: Yes   Plan:  Continue heparin  at 1500 units/hr Daily heparin  level, CBC    Olam Chalk Pharm.D. CPP, BCPS Clinical Pharmacist [phone] 06/24/2024 8:03 AM    Please check AMION for all [facility] Pharmacy phone numbers After 10:00 PM, call Main Pharmacy [phone] 06/24/2024     [1]  Allergies Allergen Reactions   Ace Inhibitors Anaphylaxis   Bactrim  Ds [Sulfamethoxazole -Trimethoprim ] Shortness Of Breath   Rythmol  [Propafenone] Shortness Of Breath   Nitrofurantoin Rash

## 2024-06-24 NOTE — Progress Notes (Signed)
 "  NAME:  Kyle Kelley, MRN:  [id number], DOB:  1946-04-17, LOS: 8 ADMISSION DATE:  06/16/2024, CONSULTATION DATE:  06/16/24 REFERRING MD:  EDP, CHIEF COMPLAINT:  acute hypoxic resp failure   History of Present Illness:  79 yo male was reportedly in normal state of health at rehab facility until yesterday afternoon/evening. Pt has complex medical history with frequent hospitalizations since October 2/2 PAD, GI bleed with perforated duodenal ulcer, wound dehiscence with wound vac placement and need for rehab/snf placement at d/c  .   All history is obtained from chart review and wife at bedside as pt is intubated and sedated.   Per wife pt was participating in rehab yesterday, walking, eating overall feeling well yesterday. In evening he did not eat much and went to bed. Unclear about events of today, per her however tonight he presented to [facility] via EMS after being found in resp distress. EMS found him unresponsive with diffuse wheezing. He was recently started on diuretics 2/2 increased edema since surgery. He was also in afib with rvr. Upon arrival to ed he was unresponsive and emergently intubated for hypoxia and airway protection. He was hypotensive unresponsive to fluids and started on levo escalated up to 25mcg at this time  Ccm asked to admit for resp failure and shock. Wife requests full code for now however expressed understanding of critical state, indicated would not want long term support.  Pertinent  Medical History  Afib on chronic a/c Pad with chronic LLE non healing wound Htn Hyperlipidemia Ao stenosis H/o cva H/o prostate cancer H/o LLE arterial occlusion with critical limb ischemia 10/25, s/p thrombectomy H/o GI bleed 2/2 duodenal ulcer 12/25 H/o perforated small bowel s/p Arlyss patch repair and lengthy TPN  Significant Hospital Events: Including procedures, antibiotic start and stop dates in addition to other pertinent events   Admitted to ICU 06/16/24 1/19 Intubated,  sedated, Afib RVR, on pressors  1/20 Pressor requirements improving, Afib 120s-130s, now following commands, still no BM- SMOG enema given. Stress dose steroids and flagyl  stopped 1/21 remains in A-fib but rate controlled.  Failed SBT.  Culture data still negative.  Added dobutamine  1/22 awake with stable respiratory status post extubation 1/23 remains on dobutamine  and HFNC, off pressors. Thora on left- 1200cc removed, not fully evacuated.  1/26 Remains on dobutamine  gtt at , Coox 67.3, continues to diurese with lasix    Interim History / Subjective:  No significant events overnight   DBA remains at 3 mcg On amio gtt On heparin  gtt   Continuing to diurese with lasix    Objective    Blood pressure (!) 95/52, pulse 94, temperature 98.5 F (36.9 C), temperature source Oral, resp. rate (!) 24, height 6' 1 (1.854 m), weight 71.6 kg, SpO2 91%. CVP:  [3 mmHg-9 mmHg] 5 mmHg      Intake/Output Summary (Last 24 hours) at 06/24/2024 1840 Last data filed at 06/24/2024 1347 Gross per 24 hour  Intake 1400.08 ml  Output 3700 ml  Net -2299.92 ml   Filed Weights   06/22/24 0500 06/23/24 0500 06/24/24 0407  Weight: 74.5 kg 79.3 kg 71.6 kg   General: acute on chronic older adult male, cachectic/frail, sitting up in ICU bed in NAD HEENT: Normocephalic, PERRLA intact, Pink MM CV: s1,s2, RRR, no MRG, No JVD  pulm: clear, diminished, no distress   Abs: bs active, soft, midline scar  Extremities: no edema, no deformity, moves all extremities on command Skin: no rash  Neuro: Rass 0, follows commands  GU: intact     Pleural fluid 2276 WBC, 85% PMNs Pleural fluid culture: NGTD x 3 days   Resolved problem list   Constipation  Assessment and Plan   Acute hypoxic respiratory failure secondary to secondary to HAP vs aspiration concurrent with a component of pulmonary edema from CHF MRSA negative completed 7 days of antibiotics, monitor off antibiotics P:  Continue aggressive diuresis  with lasix  infusion  Continue to wean O2 as able, O2 sat goal > 90% Continue aggressive pulm hygiene, continue to mobilize daily to chair, walk as tolerated   Mixed Shock- Septic now Cardiogenic component  Acute HFrEF 30-35%, RV normal AS P: Continues to remain on dobutamine  at  On lasix  gtt MAP goal >65  Continue to monitor serial coox Per AHF, starting spiro at 12.5mg   Palliative consulted per AHF   Afib RVR -hx of chronic afib on eliquis    P: Continue heparin  gtt Continue IV amio while on DBA   AKI> resolved; suspect was due to sepsis P: Continue to trend renal function daily  Continue to monitor and optimize electrolytes daily Continue to monitor urine output Continue strict I/Os Continue Adequate renal perfusion  Avoid nephrotoxic agents  Continue diuresis with lasix    Acute on Chronic Anemia Hx of GI bleed- 05/04/24-EGD showing duodenal ulcer Hx of perforated duodenal ulcer-taken to OR on 12/9 s/p ex-lap, graham patch repair  P: Monitor for signs of bleeding  Transfuse for hgb <7   Elevated LFTs- Suspect shock liver and congestive hepatopathy P: Repeat in AM on 1/27  RLE edema, asymmetric--- he reports chronic H/o severe PAD w/ Recent LE DVT s/p Thrombectomy on left Acute Ischemic Leg in 02/2024- s/p left tibioperoneal trunk endarterectomy with vein patch angioplasty, and stenting of left SFA- on plavix  PTA  negative DVT study 05/16/2024 on right lower extremity and has been on anticoagulation since so low for DVT at this point P: Continue plavix , continue heparin  gtt   H/o HTN & HLD  P:  Continue to hold Antihypertensives at this time  Continue statin   Remote H/o ICH P: Monitor neuro status while on AC  Hyperglycemia P: Continue SSI Continue CBG goal 140-180  Hypokalemia  Hypomagnesemia  P: Replace  Trend and follow labs  Will need potassium replacement while on lasix    Severe protein energy malnutrition Dysphagia P: Continue reg  diet  RD recs appreciated   Acute encephalopathy, suspect ICU delirium- improving  P: Continue delirium precautions    Sherlean Sharps AGACNP-BC   [facility] 06/13/2024, 8:41 PM  Call Cell: [phone] if have any emergent needs  7a-7p   "

## 2024-06-24 NOTE — Progress Notes (Signed)
 Speech Language Pathology Treatment: Dysphagia  Patient Details Name: Kyle Kelley MRN: [id number] DOB: May 27, 1946 Today's Date: 06/24/2024 Time: 9071-9055 SLP Time Calculation (min) (ACUTE ONLY): 16 min  Assessment / Plan / Recommendation Clinical Impression  Plan: continue with regular solids, thin liquids. SLP will f/u at least one more time.  Patient seen by SLP for skilled treatment focused on dysphagia goals. His spouse arrived during session as well. Patient, spouse and RN all have denied any coughing related to PO intake. Patient did not exhibit any overt s/s aspiration with straw sips of thin liquids. Voice remained clear throughout. He indicated that he has not been having the amount of secretions that he once was having.    HPI HPI: Kyle Kelley is a 79 yo male admitted from [facility] with acute hypoxemic respiratory failure. He was intubated on arrival on 1/18 and extubated on 1/21. Pt received on HFNC 35L. CXR (06/22/24) Extensive asymmetric bilateral nodular interstitial and airspace  infiltrates, more severe within the left lung, in keeping with multifocal  infection, with slight improvement within the left upper lung zone. Question of aspiration component. SLP consulted for clinical swallow assessment. No hx of dysphagia per pt report or chart review. PmHx significant for  frequent hospitalizations since October due to PAD, GI bleed with perforated duodenal ulcer, wound dehiscence with wound vac placement and need for rehab/snf placement at d/c  .      SLP Plan  Continue with current plan of care        Swallow Evaluation Recommendations         Recommendations  Diet recommendations: Regular;Thin liquid Liquids provided via: Cup;Straw Medication Administration: Other (Comment) (as tolerated) Supervision: Patient able to self feed Compensations: Slow rate;Small sips/bites Postural Changes and/or Swallow Maneuvers: Seated upright 90 degrees                   Oral care BID   Intermittent Supervision/Assistance Dysphagia, unspecified (R13.10)     Continue with current plan of care    Kyle IVAR Blase, MA, CCC-SLP Speech Therapy   06/24/2024, 10:59 AM

## 2024-06-24 NOTE — TOC Progression Note (Signed)
 Transition of Care [facility]) - Progression Note    Patient Details  Name: DEMITRIS POKORNY MRN: [id number] Date of Birth: August 30, 1945  [facility] CM/SW Contact  Isaiah Public, LCSWA Phone Number: 06/24/2024, 12:54 PM  Clinical Narrative:     CSW faxed patient out for SNF placement. TOC will continue to follow.   Expected Discharge Plan: Skilled Nursing Facility Barriers to Discharge: Continued Medical Work up               Expected Discharge Plan and Services In-house Referral: Clinical Social Work     Living arrangements for the past 2 months: Single Family Home                                       Social Drivers of Health (SDOH) Interventions SDOH Screenings   Food Insecurity: No Food Insecurity (06/17/2024)  Housing: Low Risk (06/17/2024)  Transportation Needs: No Transportation Needs (06/17/2024)  Utilities: Not At Risk (06/17/2024)  Social Connections: Moderately Integrated (06/17/2024)  Tobacco Use: Medium Risk (06/06/2024)    Readmission Risk Interventions    05/06/2024   12:43 PM  Readmission Risk Prevention Plan  Transportation Screening Complete  PCP or Specialist Appt within 3-5 Days Complete  HRI or Home Care Consult Complete  Social Work Consult for Recovery Care Planning/Counseling Complete  Palliative Care Screening Complete  Medication Review Oceanographer) Referral to Pharmacy

## 2024-06-25 ENCOUNTER — Inpatient Hospital Stay (HOSPITAL_COMMUNITY)

## 2024-06-25 DIAGNOSIS — A419 Sepsis, unspecified organism: Secondary | ICD-10-CM | POA: Diagnosis not present

## 2024-06-25 DIAGNOSIS — G9340 Encephalopathy, unspecified: Secondary | ICD-10-CM

## 2024-06-25 DIAGNOSIS — I110 Hypertensive heart disease with heart failure: Secondary | ICD-10-CM | POA: Diagnosis not present

## 2024-06-25 DIAGNOSIS — I482 Chronic atrial fibrillation, unspecified: Secondary | ICD-10-CM | POA: Diagnosis not present

## 2024-06-25 DIAGNOSIS — I5021 Acute systolic (congestive) heart failure: Secondary | ICD-10-CM | POA: Diagnosis not present

## 2024-06-25 DIAGNOSIS — D649 Anemia, unspecified: Secondary | ICD-10-CM | POA: Diagnosis not present

## 2024-06-25 DIAGNOSIS — R570 Cardiogenic shock: Secondary | ICD-10-CM | POA: Diagnosis not present

## 2024-06-25 DIAGNOSIS — J9601 Acute respiratory failure with hypoxia: Secondary | ICD-10-CM | POA: Diagnosis not present

## 2024-06-25 DIAGNOSIS — R1310 Dysphagia, unspecified: Secondary | ICD-10-CM | POA: Diagnosis not present

## 2024-06-25 DIAGNOSIS — R600 Localized edema: Secondary | ICD-10-CM | POA: Diagnosis not present

## 2024-06-25 DIAGNOSIS — R6521 Severe sepsis with septic shock: Secondary | ICD-10-CM | POA: Diagnosis not present

## 2024-06-25 DIAGNOSIS — E43 Unspecified severe protein-calorie malnutrition: Secondary | ICD-10-CM | POA: Diagnosis not present

## 2024-06-25 LAB — CBC
HCT: 26 % — ABNORMAL LOW (ref 39.0–52.0)
Hemoglobin: 7.8 g/dL — ABNORMAL LOW (ref 13.0–17.0)
MCH: 23.1 pg — ABNORMAL LOW (ref 26.0–34.0)
MCHC: 30 g/dL (ref 30.0–36.0)
MCV: 77.2 fL — ABNORMAL LOW (ref 80.0–100.0)
Platelets: 312 10*3/uL (ref 150–400)
RBC: 3.37 MIL/uL — ABNORMAL LOW (ref 4.22–5.81)
RDW: 20.1 % — ABNORMAL HIGH (ref 11.5–15.5)
WBC: 17.7 10*3/uL — ABNORMAL HIGH (ref 4.0–10.5)
nRBC: 0 % (ref 0.0–0.2)

## 2024-06-25 LAB — HEPARIN LEVEL (UNFRACTIONATED): Heparin Unfractionated: 0.32 [IU]/mL (ref 0.30–0.70)

## 2024-06-25 LAB — BASIC METABOLIC PANEL WITH GFR
Anion gap: 4 — ABNORMAL LOW (ref 5–15)
BUN: 27 mg/dL — ABNORMAL HIGH (ref 8–23)
CO2: 42 mmol/L — ABNORMAL HIGH (ref 22–32)
Calcium: 8 mg/dL — ABNORMAL LOW (ref 8.9–10.3)
Chloride: 87 mmol/L — ABNORMAL LOW (ref 98–111)
Creatinine, Ser: 0.71 mg/dL (ref 0.61–1.24)
GFR, Estimated: >60 mL/min
Glucose, Bld: 100 mg/dL — ABNORMAL HIGH (ref 70–99)
Potassium: 3.4 mmol/L — ABNORMAL LOW (ref 3.5–5.1)
Sodium: 133 mmol/L — ABNORMAL LOW (ref 135–145)

## 2024-06-25 LAB — COOXEMETRY PANEL
Carboxyhemoglobin: 2.2 % — ABNORMAL HIGH (ref 0.5–1.5)
Methemoglobin: <0.7 % (ref 0.0–1.5)
O2 Saturation: 70.8 %
Total hemoglobin: 8.1 g/dL — ABNORMAL LOW (ref 12.0–16.0)

## 2024-06-25 LAB — GLUCOSE, CAPILLARY
Glucose-Capillary: 101 mg/dL — ABNORMAL HIGH (ref 70–99)
Glucose-Capillary: 165 mg/dL — ABNORMAL HIGH (ref 70–99)
Glucose-Capillary: 71 mg/dL (ref 70–99)
Glucose-Capillary: 252 mg/dL — ABNORMAL HIGH (ref 70–99)

## 2024-06-25 LAB — MAGNESIUM: Magnesium: 2.2 mg/dL (ref 1.7–2.4)

## 2024-06-25 LAB — CYTOLOGY - NON PAP

## 2024-06-25 MED ORDER — MIDODRINE HCL 5 MG PO TABS
5.0000 mg | ORAL_TABLET | Freq: Three times a day (TID) | ORAL | Status: DC
  Administered 2024-06-25 – 2024-06-26 (×4): 5 mg via ORAL
  Filled 2024-06-25 (×4): qty 1

## 2024-06-25 MED ORDER — APIXABAN 5 MG PO TABS
5.0000 mg | ORAL_TABLET | Freq: Two times a day (BID) | ORAL | Status: DC
  Administered 2024-06-25 – 2024-07-01 (×12): 5 mg via ORAL
  Filled 2024-06-25 (×12): qty 1

## 2024-06-25 MED ORDER — POTASSIUM CHLORIDE 20 MEQ PO PACK
60.0000 meq | PACK | ORAL | Status: AC
  Administered 2024-06-25 (×2): 60 meq via ORAL
  Filled 2024-06-25 (×2): qty 3

## 2024-06-25 MED ORDER — FUROSEMIDE 10 MG/ML IJ SOLN
80.0000 mg | Freq: Once | INTRAMUSCULAR | Status: AC
  Administered 2024-06-25: 80 mg via INTRAVENOUS
  Filled 2024-06-25: qty 8

## 2024-06-25 MED ORDER — NOREPINEPHRINE 4 MG/250ML-% IV SOLN: 0.0000 ug/min | INTRAVENOUS | Status: DC

## 2024-06-25 MED ORDER — ACETAZOLAMIDE 250 MG PO TABS
500.0000 mg | ORAL_TABLET | Freq: Once | ORAL | Status: AC
  Administered 2024-06-25: 500 mg via ORAL
  Filled 2024-06-25: qty 2

## 2024-06-25 MED ORDER — NOREPINEPHRINE 4 MG/250ML-% IV SOLN
INTRAVENOUS | Status: AC
  Administered 2024-06-25: 2 ug/min via INTRAVENOUS
  Filled 2024-06-25: qty 250

## 2024-06-25 MED ORDER — BOOST PLUS PO LIQD
237.0000 mL | Freq: Three times a day (TID) | ORAL | Status: DC
  Administered 2024-06-25 – 2024-07-01 (×16): 237 mL via ORAL
  Filled 2024-06-25 (×20): qty 237

## 2024-06-25 NOTE — Progress Notes (Signed)
 "  NAME:  Kyle Kelley, MRN:  [id number], DOB:  1946-03-13, LOS: 9 ADMISSION DATE:  06/16/2024, CONSULTATION DATE:  06/16/24 REFERRING MD:  EDP, CHIEF COMPLAINT:  acute hypoxic resp failure   History of Present Illness:  79 yo male was reportedly in normal state of health at rehab facility until yesterday afternoon/evening. Pt has complex medical history with frequent hospitalizations since October 2/2 PAD, GI bleed with perforated duodenal ulcer, wound dehiscence with wound vac placement and need for rehab/snf placement at d/c  .   All history is obtained from chart review and wife at bedside as pt is intubated and sedated.   Per wife pt was participating in rehab yesterday, walking, eating overall feeling well yesterday. In evening he did not eat much and went to bed. Unclear about events of today, per her however tonight he presented to [facility] via EMS after being found in resp distress. EMS found him unresponsive with diffuse wheezing. He was recently started on diuretics 2/2 increased edema since surgery. He was also in afib with rvr. Upon arrival to ed he was unresponsive and emergently intubated for hypoxia and airway protection. He was hypotensive unresponsive to fluids and started on levo escalated up to 25mcg at this time  Ccm asked to admit for resp failure and shock. Wife requests full code for now however expressed understanding of critical state, indicated would not want long term support.  Pertinent  Medical History  Afib on chronic a/c Pad with chronic LLE non healing wound Htn Hyperlipidemia Ao stenosis H/o cva H/o prostate cancer H/o LLE arterial occlusion with critical limb ischemia 10/25, s/p thrombectomy H/o GI bleed 2/2 duodenal ulcer 12/25 H/o perforated small bowel s/p Arlyss patch repair and lengthy TPN  Significant Hospital Events: Including procedures, antibiotic start and stop dates in addition to other pertinent events   Admitted to ICU 06/16/24 1/19 Intubated,  sedated, Afib RVR, on pressors  1/20 Pressor requirements improving, Afib 120s-130s, now following commands, still no BM- SMOG enema given. Stress dose steroids and flagyl  stopped 1/21 remains in A-fib but rate controlled.  Failed SBT.  Culture data still negative.  Added dobutamine  1/22 awake with stable respiratory status post extubation 1/23 remains on dobutamine  and HFNC, off pressors. Thora on left- 1200cc removed, not fully evacuated.  1/26 Remains on dobutamine  gtt at , Coox 67.3, continues to diurese with lasix    Interim History / Subjective:  He thinks he feels a little better Still very weak  Objective    Blood pressure (!) 98/52, pulse 92, temperature 97.7 F (36.5 C), temperature source Axillary, resp. rate (!) 26, height 6' 1 (1.854 m), weight 70.7 kg, SpO2 93%. CVP:  [0 mmHg-9 mmHg] 4 mmHg      Intake/Output Summary (Last 24 hours) at 06/25/2024 9185 Last data filed at 06/25/2024 0700 Gross per 24 hour  Intake 1072.32 ml  Output 2350 ml  Net -1277.68 ml   Filed Weights   06/23/24 0500 06/24/24 0407 06/25/24 0500  Weight: 79.3 kg 71.6 kg 70.7 kg   Chronically ill appearing Globally weak Moderate-large bilateral effusions Aox3 + muscle wasting    Resolved problem list   Constipation Shock liver  Assessment and Plan   Acute hypoxic respiratory failure secondary to secondary to HAP vs aspiration concurrent with a component of pulmonary edema from CHF MRSA negative completed 7 days of antibiotics, monitor off antibiotics P:  Continue aggressive diuresis with lasix  infusion  Continue to wean O2 as able, O2 sat goal >  90% Continue aggressive pulm hygiene, continue to mobilize daily to chair, walk as tolerated   Mixed Shock- Septic now Cardiogenic component  Acute HFrEF 30-35%, RV normal AS P: Continues to remain on dobutamine  at  On lasix  gtt MAP goal >65  Continue to monitor serial coox Per AHF, starting spiro at 12.5mg   Palliative  consulted per AHF   Afib RVR -hx of chronic afib on eliquis    P: Continue heparin  gtt Continue IV amio while on DBA   AKI> resolved; suspect was due to sepsis P: Continue to trend renal function daily  Continue to monitor and optimize electrolytes daily Continue to monitor urine output Continue strict I/Os Continue Adequate renal perfusion  Avoid nephrotoxic agents  Continue diuresis with lasix    Acute on Chronic Anemia Hx of GI bleed- 05/04/24-EGD showing duodenal ulcer Hx of perforated duodenal ulcer-taken to OR on 12/9 s/p ex-lap, graham patch repair    Elevated LFTs- Suspect shock liver and congestive hepatopathy P: Repeat in AM on 1/27  RLE edema, asymmetric--- he reports chronic H/o severe PAD w/ Recent LE DVT s/p Thrombectomy on left Acute Ischemic Leg in 02/2024- s/p left tibioperoneal trunk endarterectomy with vein patch angioplasty, and stenting of left SFA- on plavix  PTA  negative DVT study 05/16/2024 on right lower extremity and has been on anticoagulation since so low for DVT at this point P: Continue plavix , continue heparin  gtt   H/o HTN & HLD  P:  Continue to hold Antihypertensives at this time  Continue statin   Remote H/o ICH P: Monitor neuro status while on AC  Hyperglycemia P: Continue SSI Continue CBG goal 140-180  Hypokalemia  Hypomagnesemia  P: Replace  Trend and follow labs  Will need potassium replacement while on lasix    Severe protein energy malnutrition Dysphagia P: Continue reg diet  RD recs appreciated   Acute encephalopathy, suspect ICU delirium- improving  P: Continue delirium precautions    Sherlean Sharps AGACNP-BC   [facility] 06/13/2024, 8:41 PM  Call Cell: [phone] if have any emergent needs  7a-7p   "

## 2024-06-25 NOTE — Procedures (Signed)
 Thoracentesis  Procedure Note  SHA AMER  [id number]  February 12, 1946  Date:06/25/24  Time:10:16 AM   Provider Performing:Tarah Buboltz C Claudene   Procedure: Thoracentesis with imaging guidance (67444)  Indication(s) Pleural Effusion  Consent Risks of the procedure as well as the alternatives and risks of each were explained to the patient and/or caregiver.  Consent for the procedure was obtained and is signed in the bedside chart  Anesthesia Topical only with 1% lidocaine     Time Out Verified patient identification, verified procedure, site/side was marked, verified correct patient position, special equipment/implants available, medications/allergies/relevant history reviewed, required imaging and test results available.   Sterile Technique Maximal sterile technique including full sterile barrier drape, hand hygiene, sterile gown, sterile gloves, mask, hair covering, sterile ultrasound probe cover (if used).  Procedure Description Ultrasound was used to identify appropriate pleural anatomy for placement and overlying skin marked.  Area of drainage cleaned and draped in sterile fashion. Lidocaine  was used to anesthetize the skin and subcutaneous tissue.  1500 cc's of straw appearing fluid was drained from the left pleural space. Catheter then removed and bandaid applied to site.   Complications/Tolerance None; patient tolerated the procedure well. Chest X-ray is ordered to confirm no post-procedural complication.   EBL Minimal   Specimen(s) None

## 2024-06-25 NOTE — Progress Notes (Addendum)
 "    Advanced Heart Failure Rounding Note  Cardiologist: Wilbert Bihari, MD  Chief Complaint: cardiogenic shock Subjective:   1/25 DBA cut back to 3 mcg. Diuresed with IV lasix .  1/26 Diuresed with IV lasix . Amio cut back to 30 mg per hour. Placed back on rocephin .   Remains on DBA 3 + Amio 30 + heparin .   CO-OX 71%.   Denies SOB.   Objective:   Weight Range: 70.7 kg Body mass index is 20.56 kg/m.   Vital Signs:   Temp:  [97.2 F (36.2 C)-98.6 F (37 C)] 97.7 F (36.5 C) (01/27 0321) Pulse Rate:  [78-101] 92 (01/27 0700) Resp:  [14-27] 26 (01/27 0700) BP: (84-119)/(43-86) 98/52 (01/27 0700) SpO2:  [76 %-99 %] 93 % (01/27 0700) Weight:  [70.7 kg] 70.7 kg (01/27 0500) Last BM Date : 06/24/24  Weight change: Filed Weights   06/23/24 0500 06/24/24 0407 06/25/24 0500  Weight: 79.3 kg 71.6 kg 70.7 kg    Intake/Output:   Intake/Output Summary (Last 24 hours) at 06/25/2024 0724 Last data filed at 06/25/2024 0700 Gross per 24 hour  Intake 1072.32 ml  Output 2550 ml  Net -1477.68 ml     CVP 5  Physical Exam   General:  Frail  No resp difficulty Neck: no JVD.  Cor: Irregular rate & rhythm.  Lungs: R decreased.  Abdomen: soft, nontender, nondistended.  Extremities: R and LLE 2-3+  edema Neuro: alert & oriented x3  Telemetry   A fib 80-90s   Medications:     Scheduled Medications:  ascorbic acid   500 mg Oral Daily   Chlorhexidine  Gluconate Cloth  6 each Topical Daily   clopidogrel   75 mg Oral Daily   collagenase    Topical Daily   digoxin   0.0625 mg Oral Daily   famotidine   20 mg Oral QHS   feeding supplement  237 mL Oral TID BM   insulin  aspart  0-9 Units Subcutaneous TID AC & HS   lactulose   20 g Oral TID   multivitamin with minerals  1 tablet Oral Daily   polyethylene glycol  17 g Oral BID   senna-docusate  2 tablet Oral QHS   sodium chloride  flush  10-40 mL Intracatheter Q12H   spironolactone   12.5 mg Oral Daily   zinc  sulfate (50mg  elemental zinc )   220 mg Oral Daily    Infusions:  amiodarone  30 mg/hr (06/25/24 0700)   cefTRIAXone  (ROCEPHIN )  IV Stopped (06/24/24 1257)   DOBUTamine  3 mcg/kg/min (06/25/24 0700)   heparin  1,500 Units/hr (06/25/24 0700)    PRN Medications: haloperidol  lactate, ipratropium-albuterol , ondansetron  (ZOFRAN ) IV, sodium chloride  flush    Patient Profile   Kyle Kelley is a 79 y.o. male with a history of prostate cancer, CVA, multiple hospitalizations secondary to PAD, GI bleed perforated duodenal ulcer, wound dehiscence requiring skilled placement. Here with mixed shock.  Assessment/Plan   Mixed shock - cardiogenic/septic - Likely aspiration PNA and low coox with IV DBA at 5 - Echo 06/17/24 EF 30-35% - Echo 06/21/24 EF 40-45% - Suspect he has severe underlying CAD but no angina and not CABG candidate so medical therapy likely most appropriate at this time. Can consider ischemic eval down the road if significant recovery, but doubt it will change management - Palliative care consult has been placed - CO-OX 71%. Cut back DBA 1.5 mcg. Anticipate stopping tomorrow.  - Add midodrine  5 mg three times a day.  - Continue spiro 12.5 mg daily  -Supp K.  -  Volume status difficult. CVP 5 but with significant lower extremity edema. Give 80 mg IV lasix  now.   Afib RVR on admit - Permanent A fib  -Continue amio 30 while on DBA.  -Stop Heparin  drip. Start eliquis  5 mg twice a day.     Acute Hypoxic Respirator Failure HCAP versus Aspiration.  -Intubated on arrival.  -Extubated 06/19/24 -Remains on [place] 4 liters.  - improving with diuresis   Anemia   -HX of GI Bleed 05/04/24 EGD showing duodena ulcer - Hx of perforated duodenal ulcer-taken to OR on 12/9 s/p ex-lap, graham patch repair.  -hg 7.6>7.8>7.8  today - CCM managing   PAD , Ischemic Leg in 02/2024- s/p left tibioperoneal trunk endarterectomy with vein patch angioplasty, and stenting of left SFA-  -LE DVT S/P Thrombectomy  -LLE full thickness wound.  Continue wound care.    Aortic Stenosis  -Moderate previous ECHO. Calcification.  -Based on echo is not severe   Pleural Effusion,Bilateral  - s/p left thoracentesis on 1/23 with 1200 cc out  DNR/DNI - treat the treatable. No intubation or CPR - Palliative care consultation given mutiple comorbid medical conditions, severe frailty and malnutrition    CRITICAL CARE Performed by: Greig Mosses NP-C    Total critical care time:10  minutes  Critical care time was exclusive of separately billable procedures and treating other patients.  Critical care was necessary to treat or prevent imminent or life-threatening deterioration.  Critical care was time spent personally by me on the following activities: development of treatment plan with patient and/or surrogate as well as nursing, discussions with consultants, evaluation of patient's response to treatment, examination of patient, obtaining history from patient or surrogate, ordering and performing treatments and interventions, ordering and review of laboratory studies, ordering and review of radiographic studies, pulse oximetry and re-evaluation of patient's condition.   Length of Stay: 9  Amy Clegg, NP  06/25/2024, 7:24 AM  Advanced Heart Failure Team Pager [phone] (M-F; 7a - 5p)   Please visit [email or web address]: For overnight coverage please call cardiology fellow first. If fellow not available call Shock/ECMO MD on call.  For ECMO / Mechanical Support (Impella, IABP, LVAD) issues call Shock / ECMO MD on call.   Patient seen with NP, I formulated the plan and agree with the above note.   CVP 8-9 on my read today.  Co-ox 71% on dobutamine  3, SBP 90s.  He remains on amiodarone  30 mg/hr for rate control.    He is on ceftriaxone , peptostreptococcus on blood cultures.    Creatinine stable 0.71.   General: NAD, frail Neck: JVP 8-9 cm, no thyromegaly or thyroid  nodule.  Lungs: Decreased BS at bases.  CV: Nondisplaced PMI.  Heart regular  S1/S2, no S3/S4, no murmur.  2+ edema 1/2 to knees bilaterally.  Abdomen: Soft, nontender, no hepatosplenomegaly, no distention.  Skin: Intact without lesions or rashes.  Neurologic: Alert and oriented x 3.  Psych: Normal affect. Extremities: No clubbing or cyanosis.  HEENT: Normal.   Still on 4L oxygen.  CVP 8-9 with peripheral edema, will give Lasix  80 mg IV x 1 today and acetazolamide  500 x 1 with HCO3 42. Bilateral pleural effusions, CCM plans thoracenteses today.   Continue digoxin  0.0625, wean dobutamine  to 1.5.  With low MAP, will add midodrine  5 mg tid.   On amiodarone  gtt for rate control.  Covering Peptostreptococcus with ceftriaxone .   Deconditioned, will need SNF.   CRITICAL CARE Performed by: Ezra Shuck  Total critical care time: 26  minutes  Critical care time was exclusive of separately billable procedures and treating other patients.  Critical care was necessary to treat or prevent imminent or life-threatening deterioration.  Critical care was time spent personally by me on the following activities: development of treatment plan with patient and/or surrogate as well as nursing, discussions with consultants, evaluation of patient's response to treatment, examination of patient, obtaining history from patient or surrogate, ordering and performing treatments and interventions, ordering and review of laboratory studies, ordering and review of radiographic studies, pulse oximetry and re-evaluation of patient's condition.  Ezra Shuck 06/25/2024 8:44 AM   "

## 2024-06-25 NOTE — Progress Notes (Signed)
 Occupational Therapy Treatment Patient Details Name: Kyle Kelley MRN: [id number] DOB: [birth year] Today's Date: 06/25/2024   History of present illness 79 yo male presents from Rehab Facility 06/16/2024 for respiratory distress intubated on arrival.  Severe L-sided aspiration PNA and resultant septic shock.  PMHx: atrial fibrillation (on Eliquis /recently held), vasculopath, acute on chronic ischemic LLE nonhealing wound, HTN, HLD, aortic stenosis, CVA, prostate cancer s/p prostatectomy 2014, and PVD.   OT comments  Pt progressing slowly toward established OT goals, reporting he is fatigued today and RN confirms he has been working at mobilizing a lot the past ~5 days while she has been his [employer]. Focus session on activity tolerance, sitting balance, and return to BADL. Pt needing mod A for bed mobility, CGA for reaching outside BOS at Eob, and supervision for grooming. Provided max education regarding importance of getting OOB and upright daily to reduce deconditioning. Will continue to follow.       If plan is discharge home, recommend the following:  A lot of help with walking and/or transfers;A lot of help with bathing/dressing/bathroom;Assistance with cooking/housework;Assist for transportation   Equipment Recommendations  Other (comment) (defer)    Recommendations for Other Services      Precautions / Restrictions Precautions Precautions: Fall Recall of Precautions/Restrictions: Intact Precaution/Restrictions Comments: HFNC Restrictions Weight Bearing Restrictions Per Provider Order: No       Mobility Bed Mobility Overal bed mobility: Needs Assistance Bed Mobility: Supine to Sit, Sit to Supine     Supine to sit: Mod assist, HOB elevated, Used rails Sit to supine: Mod assist   General bed mobility comments: to bring BLE off EOB, elevate trunk. Mod A for BLE into bed and repositioning    Transfers                   General transfer comment: pt deferred      Balance Overall balance assessment: Needs assistance Sitting-balance support: Bilateral upper extremity supported, Feet supported Sitting balance-Leahy Scale: Fair     Standing balance support: Bilateral upper extremity supported, During functional activity, Reliant on assistive device for balance Standing balance-Leahy Scale: Poor                             ADL either performed or assessed with clinical judgement   ADL Overall ADL's : Needs assistance/impaired     Grooming: Oral care;Wash/dry face;Sitting;Supervision/safety;Set up Grooming Details (indicate cue type and reason): forward rounded shoulders in sitting with cues for upright posture             Lower Body Dressing: Moderate assistance;Sitting/lateral leans Lower Body Dressing Details (indicate cue type and reason): pt unable to achieve figure 4 but able to reach to top of foot, but not toes. Thus, mod A for socks.                    Extremity/Trunk Assessment Upper Extremity Assessment Upper Extremity Assessment: Generalized weakness   Lower Extremity Assessment Lower Extremity Assessment: Defer to PT evaluation        Vision   Vision Assessment?: No apparent visual deficits   Perception     Praxis     Communication Communication Communication: Impaired Factors Affecting Communication: Hearing impaired   Cognition Arousal: Alert Behavior During Therapy: WFL for tasks assessed/performed Cognition: No apparent impairments             OT - Cognition Comments: Mild deficits, likely d/t HoH. pleasant  and conversational                 Following commands: Intact        Cueing   Cueing Techniques: Verbal cues, Gestural cues, Tactile cues  Exercises Exercises: Other exercises Other Exercises Other Exercises: worked on reaching outside BOS with fair balance throughout with BUE 8x ea.    Shoulder Instructions       General Comments      Pertinent Vitals/  Pain       Pain Assessment Pain Assessment: Faces Faces Pain Scale: Hurts a little bit Pain Location: LLE Pain Descriptors / Indicators: Grimacing, Guarding Pain Intervention(s): Monitored during session  Home Living                                          Prior Functioning/Environment              Frequency  Min 1X/week        Progress Toward Goals  OT Goals(current goals can now be found in the care plan section)  Progress towards OT goals: Progressing toward goals  Acute Rehab OT Goals OT Goal Formulation: With patient Time For Goal Achievement: 07/05/24 Potential to Achieve Goals: Good ADL Goals Pt Will Perform Upper Body Dressing: with set-up;sitting Pt Will Perform Lower Body Dressing: with mod assist;sit to/from stand Pt Will Transfer to Toilet: with mod assist;stand pivot transfer;bedside commode Pt Will Perform Toileting - Clothing Manipulation and hygiene: with mod assist;sit to/from stand;sitting/lateral leans Additional ADL Goal #1: Pt will verbalize at least 3 energy conservation strategies for ADLs  Plan      Co-evaluation                 AM-PAC OT 6 Clicks Daily Activity     Outcome Measure   Help from another person eating meals?: None Help from another person taking care of personal grooming?: A Little Help from another person toileting, which includes using toliet, bedpan, or urinal?: A Lot Help from another person bathing (including washing, rinsing, drying)?: A Lot Help from another person to put on and taking off regular upper body clothing?: A Little Help from another person to put on and taking off regular lower body clothing?: A Lot 6 Click Score: 16    End of Session Equipment Utilized During Treatment: Rolling walker (2 wheels);Oxygen  OT Visit Diagnosis: Unsteadiness on feet (R26.81);Other abnormalities of gait and mobility (R26.89);Muscle weakness (generalized) (M62.81)   Activity Tolerance Patient  tolerated treatment well   Patient Left with call bell/phone within reach;in bed;with bed alarm set;with nursing/sitter in room   Nurse Communication Mobility status        Time: 8450-8385 OT Time Calculation (min): 25 min  Charges: OT General Charges $OT Visit: 1 Visit OT Treatments $Self Care/Home Management : 8-22 mins $Therapeutic Activity: 8-22 mins  Elma JONETTA Penner, OTD, OTR/L [facility] Acute Rehabilitation Office: [phone]   Elma JONETTA Penner 06/25/2024, 4:30 PM

## 2024-06-25 NOTE — Procedures (Signed)
 Thoracentesis  Procedure Note  Kyle Kelley  [id number]  06/05/1945  Date:06/25/24  Time:10:17 AM   Provider Performing:Rayleen Wyrick C Claudene   Procedure: Thoracentesis with imaging guidance (67444)  Indication(s) Pleural Effusion  Consent Risks of the procedure as well as the alternatives and risks of each were explained to the patient and/or caregiver.  Consent for the procedure was obtained and is signed in the bedside chart  Anesthesia Topical only with 1% lidocaine     Time Out Verified patient identification, verified procedure, site/side was marked, verified correct patient position, special equipment/implants available, medications/allergies/relevant history reviewed, required imaging and test results available.   Sterile Technique Maximal sterile technique including full sterile barrier drape, hand hygiene, sterile gown, sterile gloves, mask, hair covering, sterile ultrasound probe cover (if used).  Procedure Description Ultrasound was used to identify appropriate pleural anatomy for placement and overlying skin marked.  Area of drainage cleaned and draped in sterile fashion. Lidocaine  was used to anesthetize the skin and subcutaneous tissue.  700 cc's of straw appearing fluid was drained from the right pleural space. Catheter then removed and bandaid applied to site.   Complications/Tolerance None; patient tolerated the procedure well. Chest X-ray is ordered to confirm no post-procedural complication.   EBL Minimal   Specimen(s) None

## 2024-06-25 NOTE — Consult Note (Signed)
 "                                                  Palliative Care Consult Note                                  Date: 06/25/2024   Patient Name: Kyle Kelley  DOB: 11/08/1945  MRN: [id number]  Age / Sex: 79 y.o., male  PCP: Charlott Dorn LABOR, MD Referring Physician: Claudene Toribio BROCKS, MD  Reason for Consultation: {Reason for Consult:23484}  Past Medical History:  Diagnosis Date   Aortic stenosis    Moderate AS with mean AVG 10.8, V-max 2.13m/s, SVI 28, DI 0.3   Carotid artery stenosis    1-39% bilaterally by dopplers 11/2022   Dyslipidemia    Hypertension    Inguinal hernia 2010   Small asymptomatic L inguinal Hernia   Peripheral vascular disease    Permanent atrial fibrillation (HCC)    atrial fib    Pneumonia    hx of    Prostate cancer (HCC) 07/2012   s/p prostatectomy   PSA elevation    workup per Dr. Alm Fragmin, March 2014   Sigmoid diverticulosis    on colonoscopy in 2010   Skin cancer 02/2012   Dr Renay Homer   Stroke [facility])    Tobacco dependence    rarely smokes-3 cigs per week    Subjective:   This NP Camellia Kays reviewed medical records, received report from team, assessed the patient and then meet at the patient's bedside to discuss diagnosis, prognosis, GOC, EOL wishes disposition and options.  Before meeting with the patient/family, I spent time reviewing the chart notes including ***. I also reviewed vital signs, nursing flowsheets, medication administrations record, labs, and imaging. Labs reviewed include ***.  I met with ***.   We meet to discuss diagnosis prognosis, GOC, EOL wishes, disposition and options. Concept of Palliative Care was introduced as specialized medical care for people and their families living with serious illness.  If focuses on providing relief from the symptoms and stress of a serious illness.  The goal is to improve quality of life for both the patient and the family. Values and goals of care important to patient and family were  attempted to be elicited.  Created space and opportunity for patient  and family to explore thoughts and feelings regarding current medical situation   Natural trajectory and current clinical status were discussed. Questions and concerns addressed. Patient  encouraged to call with questions or concerns.    Patient/Family Understanding of Illness: ***  Life Review: ***  Patient Values: ***  Baseline Status: ***  Today's Discussion: ***  Goals: ***  Review of Systems  Objective:   Primary Diagnoses: Present on Admission:  Acute hypoxemic respiratory failure (HCC)   Vital Signs:  BP (!) 103/50   Pulse 82   Temp 97.7 F (36.5 C) (Oral)   Resp (!) 26   Ht 6' 1 (1.854 m)   Wt 70.7 kg   SpO2 96%   BMI 20.56 kg/m   Physical Exam  Palliative Assessment/Data: ***   Advanced Care Planning:   Existing Vynca/ACP Documentation: ***  Primary Decision Maker: {Primary Decision Fjxzm:78612}  Pertinent diagnosis: ***  The patient and/or family consented  to a voluntary Advance Care Planning Conversation in person/over the phone***. Individuals present for the conversation: ***  Summary of the conversation: ***  Outcome of the conversations and/or documents completed: ***  I spent *** minutes providing separately identifiable ACP services with the patient and/or surrogate decision maker in a voluntary, in-person conversation discussing the patient's wishes and goals as detailed in the above note.  Assessment & Plan:   HPI/Patient Profile: 79 y.o. male  with past medical history of *** admitted on 06/16/2024 with ***.   SUMMARY OF RECOMMENDATIONS   ***  Symptom Management:  ***  Code Status: {Updated Palliative Code Status:33307}  Prognosis:  {Palliative Care Prognosis:23504}  Discharge Planning:  {Palliative dispostion:23505}   Discussed with: ***    Thank you for allowing us  to participate in the care of LOGUN COLAVITO PMT will continue to  support holistically.  Time Total: ***  Detailed review of medical records (labs, imaging, vital signs), medically appropriate exam, discussed with treatment team, counseling and education to patient, family, & staff, documenting clinical information, medication management, coordination of care  Signed by: Camellia Kays, NP Palliative Medicine Team  Team Phone # [phone] (Nights/Weekends)  06/25/2024, 2:00 PM  "

## 2024-06-25 NOTE — TOC Progression Note (Signed)
 Transition of Care [facility]) - Progression Note    Patient Details  Name: Kyle Kelley MRN: [id number] Date of Birth: 1946-01-14  [facility]) CM/SW Contact  Isaiah Public, LCSWA Phone Number: 06/25/2024, 3:55 PM  Clinical Narrative:     CSW spoke with patient at bedside and provided SNF bed offers. Patient accepted SNF bed offer with [facility]. CSW will continue to follow and assist with patients dc planning needs.   Expected Discharge Plan: Skilled Nursing Facility Barriers to Discharge: Continued Medical Work up               Expected Discharge Plan and Services In-house Referral: Clinical Social Work     Living arrangements for the past 2 months: Single Family Home                                       Social Drivers of Health (SDOH) Interventions SDOH Screenings   Food Insecurity: No Food Insecurity (06/17/2024)  Housing: Low Risk (06/17/2024)  Transportation Needs: No Transportation Needs (06/17/2024)  Utilities: Not At Risk (06/17/2024)  Social Connections: Moderately Integrated (06/17/2024)  Tobacco Use: Medium Risk (06/06/2024)    Readmission Risk Interventions    05/06/2024   12:43 PM  Readmission Risk Prevention Plan  Transportation Screening Complete  PCP or Specialist Appt within 3-5 Days Complete  HRI or Home Care Consult Complete  Social Work Consult for Recovery Care Planning/Counseling Complete  Palliative Care Screening Complete  Medication Review Oceanographer) Referral to Pharmacy

## 2024-06-25 NOTE — Progress Notes (Signed)
 ANTICOAGULATION CONSULT NOTE  Pharmacy Consult for heparin  Indication: Afib (Eliquis  PTA), CLI s/p thrombectomy/endarterectomy 02/2024 *hx ICH (cleared by [facility] for anticoagulation 02/2024), GIB 04/2024 s/p repair  Allergies[1]  Patient Measurements: Height: 6' 1 (185.4 cm) Weight: 70.7 kg (155 lb 13.8 oz) IBW/kg (Calculated) : 79.9 Heparin  Dosing Weight: 74 kg   Vital Signs: Temp: 97.7 F (36.5 C) (01/27 0321) Temp Source: Axillary (01/27 0321) BP: 98/52 (01/27 0700) Pulse Rate: 80 (01/27 1100)  Labs: Recent Labs    06/22/24 1812 06/23/24 0431 06/23/24 0431 06/24/24 0400 06/24/24 1658 06/25/24 0502  HGB  --  7.6*   < > 7.8*  --  7.8*  HCT  --  25.1*  --  26.8*  --  26.0*  PLT  --  257  --  296  --  312  APTT 67* 68*  --  61*  --   --   HEPARINUNFRC  --  0.36  --  0.31  --  0.32  CREATININE  --  0.71   < > 0.60* 0.78 0.71   < > = values in this interval not displayed.     Assessment: 16 yoM presents from SNF with respiratory failure now with newly reduced LVEF (30-35%), AF RVR, and possible HCAP vs aspiration PNA..  On Eliquis  PTA for Afib (last dose 1/18 AM).  Pharmacy consulted for heparin  dosing.  Per discussion with CCM 1/19, plan to defer bolus and pursue low-goal with hx of recent GIB, ICH and anemia at baseline.  Heparin  level 0.32 s therapeutic with heparin  running at 1500 units/hr. Hgb (7.s - stable) and PLTs (200s stable) No  signs of bleeding.  Heparin  held for thoracentesis this am > plan for restart apixaban  this evening  Goal of Therapy:  Heparin  level 0.3-0.5 units/ml aPTT 66-85 seconds  Monitor platelets by anticoagulation protocol: Yes   Plan:  Stop heparin   Apixaban  5mg  BID begin this evening  Monitor s/s bleeding     Olam Chalk Pharm.D. CPP, BCPS Clinical Pharmacist [phone] 06/25/2024 11:32 AM    Please check AMION for all [facility], [facility], [facility] Pharmacy phone numbers After 10:00 PM, call Main Pharmacy [phone] 06/25/2024      [1]   Allergies Allergen Reactions   Ace Inhibitors Anaphylaxis   Bactrim  Ds [Sulfamethoxazole -Trimethoprim ] Shortness Of Breath   Rythmol [Propafenone] Shortness Of Breath   Nitrofurantoin Rash

## 2024-06-25 NOTE — Progress Notes (Signed)
 Nutrition Follow-up  DOCUMENTATION CODES:   Severe malnutrition in context of chronic illness  INTERVENTION:   Continue Regular diet  Change to Boost Plus po TID (chocolate), each supplement provides 360 kcal and 14 grams of protein. Ensure Plus High Protein only available in Vanilla at present, pt prefers chocolate  Magic cup TID with meals (vanilla and chocolate only), each supplement provides 290 kcal and 9 grams of protein  Continue Vitamin C  500 mg daily x 30 days, Zinc  sulfate 220 mg daily x 14 days for wound healing Continue MVI with Minerals  NUTRITION DIAGNOSIS:   Severe Malnutrition related to chronic illness as evidenced by severe muscle depletion, severe fat depletion, edema.  Being addressed via supplements  GOAL:   Patient will meet greater than or equal to 90% of their needs  Progressing  MONITOR:   PO intake, Labs, Weight trends  REASON FOR ASSESSMENT:   Consult Enteral/tube feeding initiation and management  ASSESSMENT:   79 yo male admitted with acute respiratory failure with pneumonia (aspiration vs HCAP) and CHF requiring intubation, +shock (mixed: cardiogenic and septic). Pt with recent hx of GI bleed with  perforated ulcer requiring graham patch repair and TPN for nutrition in December of 2025, acute left leg ischemia requiring stent in October 2025. PMH includes HTN, prostate cancer, anemia, malnutrition, PVD/PAD  1/27 Thoracentesis with 2.2L removed  Remains on HHFNC Bilateral Thoracentesis performed with total of 2.2 L fluid removed Remains on dobutamine  at 3, amiodarone  at 30 +significant BLE but CVP not elevated, received lasix  again today UOP 2.55 L in 24 hours, 1.2 L so far today  Noted palliative care has been consulted  Appetite is fair; pt ate some of the pot roast and some of the magic cup at lunch today.   Pt not drinking Ensure as we only have vanilla flavor at present, pt likes Chocolate. Boost Plus is available in Chocolate;  RD to change order.   Pt is eating some of the Magic Cup but only likes Vanilla and Chocolate.   Labs: CBGs 71-165 Sodium 133 (L) Potassium 3.4 (L) BUN 27 Creatinine wdl  Meds: Miralax  BID Senna-Docusate daily Zinc  Sulfate Vitamin C  MVI Midodrine  SS Novlog Pepcid    Diet Order:   Diet Order             Diet regular Room service appropriate? Yes with Assist; Fluid consistency: Thin  Diet effective now                   EDUCATION NEEDS:   Education needs have been addressed  Skin:  Skin Assessment: Skin Integrity Issues: Skin Integrity Issues:: Unstageable Unstageable: sacrum (image noted in chart)  Last BM:  1/26 large type 6  Height:   Ht Readings from Last 1 Encounters:  06/16/24 6' 1 (1.854 m)    Weight:   Wt Readings from Last 1 Encounters:  06/25/24 70.7 kg    BMI:  Body mass index is 20.56 kg/m.  Estimated Nutritional Needs:   Kcal:  2200-2400 kcals  Protein:  125-155 g  Fluid:  1.8 L  Betsey Finger MS, RDN, LDN, CNSC Registered Dietitian 3 Clinical Nutrition RD Inpatient Contact Info in Amion

## 2024-06-26 ENCOUNTER — Other Ambulatory Visit: Payer: Self-pay

## 2024-06-26 ENCOUNTER — Encounter (HOSPITAL_COMMUNITY): Payer: Self-pay | Admitting: Critical Care Medicine

## 2024-06-26 ENCOUNTER — Ambulatory Visit

## 2024-06-26 DIAGNOSIS — R1310 Dysphagia, unspecified: Secondary | ICD-10-CM | POA: Diagnosis not present

## 2024-06-26 DIAGNOSIS — I5021 Acute systolic (congestive) heart failure: Secondary | ICD-10-CM | POA: Diagnosis not present

## 2024-06-26 DIAGNOSIS — R6521 Severe sepsis with septic shock: Secondary | ICD-10-CM | POA: Diagnosis not present

## 2024-06-26 DIAGNOSIS — D649 Anemia, unspecified: Secondary | ICD-10-CM | POA: Diagnosis not present

## 2024-06-26 DIAGNOSIS — R600 Localized edema: Secondary | ICD-10-CM | POA: Diagnosis not present

## 2024-06-26 DIAGNOSIS — I482 Chronic atrial fibrillation, unspecified: Secondary | ICD-10-CM | POA: Diagnosis not present

## 2024-06-26 DIAGNOSIS — I110 Hypertensive heart disease with heart failure: Secondary | ICD-10-CM | POA: Diagnosis not present

## 2024-06-26 DIAGNOSIS — A419 Sepsis, unspecified organism: Secondary | ICD-10-CM | POA: Diagnosis not present

## 2024-06-26 DIAGNOSIS — R570 Cardiogenic shock: Secondary | ICD-10-CM | POA: Diagnosis not present

## 2024-06-26 DIAGNOSIS — E43 Unspecified severe protein-calorie malnutrition: Secondary | ICD-10-CM | POA: Diagnosis not present

## 2024-06-26 DIAGNOSIS — J9601 Acute respiratory failure with hypoxia: Secondary | ICD-10-CM | POA: Diagnosis not present

## 2024-06-26 LAB — CBC
HCT: 26 % — ABNORMAL LOW (ref 39.0–52.0)
Hemoglobin: 7.8 g/dL — ABNORMAL LOW (ref 13.0–17.0)
MCH: 22.9 pg — ABNORMAL LOW (ref 26.0–34.0)
MCHC: 30 g/dL (ref 30.0–36.0)
MCV: 76.2 fL — ABNORMAL LOW (ref 80.0–100.0)
Platelets: 358 10*3/uL (ref 150–400)
RBC: 3.41 MIL/uL — ABNORMAL LOW (ref 4.22–5.81)
RDW: 20.5 % — ABNORMAL HIGH (ref 11.5–15.5)
WBC: 20.4 10*3/uL — ABNORMAL HIGH (ref 4.0–10.5)
nRBC: 0 % (ref 0.0–0.2)

## 2024-06-26 LAB — COOXEMETRY PANEL
Carboxyhemoglobin: 1.6 % — ABNORMAL HIGH (ref 0.5–1.5)
Methemoglobin: 1 % (ref 0.0–1.5)
O2 Saturation: 66.3 %
Total hemoglobin: 8.3 g/dL — ABNORMAL LOW (ref 12.0–16.0)

## 2024-06-26 LAB — GLUCOSE, CAPILLARY
Glucose-Capillary: 75 mg/dL (ref 70–99)
Glucose-Capillary: 125 mg/dL — ABNORMAL HIGH (ref 70–99)
Glucose-Capillary: 76 mg/dL (ref 70–99)
Glucose-Capillary: 140 mg/dL — ABNORMAL HIGH (ref 70–99)

## 2024-06-26 LAB — BASIC METABOLIC PANEL WITH GFR
Anion gap: 5 (ref 5–15)
BUN: 31 mg/dL — ABNORMAL HIGH (ref 8–23)
CO2: 37 mmol/L — ABNORMAL HIGH (ref 22–32)
Calcium: 8.1 mg/dL — ABNORMAL LOW (ref 8.9–10.3)
Chloride: 91 mmol/L — ABNORMAL LOW (ref 98–111)
Creatinine, Ser: 0.77 mg/dL (ref 0.61–1.24)
GFR, Estimated: >60 mL/min
Glucose, Bld: 91 mg/dL (ref 70–99)
Potassium: 3.5 mmol/L (ref 3.5–5.1)
Sodium: 133 mmol/L — ABNORMAL LOW (ref 135–145)

## 2024-06-26 LAB — MAGNESIUM: Magnesium: 2 mg/dL (ref 1.7–2.4)

## 2024-06-26 MED ORDER — FUROSEMIDE 10 MG/ML IJ SOLN: 80.0000 mg | Freq: Two times a day (BID) | INTRAMUSCULAR | Status: DC

## 2024-06-26 MED ORDER — SPIRONOLACTONE 25 MG PO TABS
25.0000 mg | ORAL_TABLET | Freq: Every day | ORAL | Status: DC
  Administered 2024-06-26 – 2024-07-01 (×6): 25 mg via ORAL
  Filled 2024-06-26 (×6): qty 1

## 2024-06-26 MED ORDER — MIDODRINE HCL 5 MG PO TABS
10.0000 mg | ORAL_TABLET | Freq: Three times a day (TID) | ORAL | Status: DC
  Administered 2024-06-26 – 2024-07-01 (×15): 10 mg via ORAL
  Filled 2024-06-26 (×15): qty 2

## 2024-06-26 MED ORDER — DAPAGLIFLOZIN PROPANEDIOL 10 MG PO TABS
10.0000 mg | ORAL_TABLET | Freq: Every day | ORAL | Status: DC
  Administered 2024-06-26: 10 mg via ORAL
  Filled 2024-06-26: qty 1

## 2024-06-26 MED ORDER — DAKINS (1/4 STRENGTH) 0.125 % EX SOLN
Freq: Every day | CUTANEOUS | Status: DC
  Administered 2024-06-28: 1
  Filled 2024-06-26 (×2): qty 473

## 2024-06-26 MED ORDER — ACETAZOLAMIDE 250 MG PO TABS: 500.0000 mg | ORAL_TABLET | Freq: Once | ORAL | Status: DC

## 2024-06-26 MED ORDER — TORSEMIDE 20 MG PO TABS
40.0000 mg | ORAL_TABLET | Freq: Every day | ORAL | Status: DC
  Administered 2024-06-26 – 2024-07-01 (×6): 40 mg via ORAL
  Filled 2024-06-26 (×6): qty 2

## 2024-06-26 MED ORDER — POTASSIUM CHLORIDE CRYS ER 20 MEQ PO TBCR
40.0000 meq | EXTENDED_RELEASE_TABLET | ORAL | Status: AC
  Administered 2024-06-26 (×2): 40 meq via ORAL
  Filled 2024-06-26: qty 2

## 2024-06-26 NOTE — Progress Notes (Signed)
 "  NAME:  Kyle Kelley, MRN:  [id number], DOB:  1946/03/08, LOS: 10 ADMISSION DATE:  06/16/2024, CONSULTATION DATE:  06/16/24 REFERRING MD:  EDP, CHIEF COMPLAINT:  acute hypoxic resp failure   History of Present Illness:  79 yo male was reportedly in normal state of health at rehab facility until yesterday afternoon/evening. Pt has complex medical history with frequent hospitalizations since October 2/2 PAD, GI bleed with perforated duodenal ulcer, wound dehiscence with wound vac placement and need for rehab/snf placement at d/c  .   All history is obtained from chart review and wife at bedside as pt is intubated and sedated.   Per wife pt was participating in rehab yesterday, walking, eating overall feeling well yesterday. In evening he did not eat much and went to bed. Unclear about events of today, per her however tonight he presented to [facility] via EMS after being found in resp distress. EMS found him unresponsive with diffuse wheezing. He was recently started on diuretics 2/2 increased edema since surgery. He was also in afib with rvr. Upon arrival to ed he was unresponsive and emergently intubated for hypoxia and airway protection. He was hypotensive unresponsive to fluids and started on levo escalated up to 25mcg at this time  Ccm asked to admit for resp failure and shock. Wife requests full code for now however expressed understanding of critical state, indicated would not want long term support.  Pertinent  Medical History  Afib on chronic a/c Pad with chronic LLE non healing wound Htn Hyperlipidemia Ao stenosis H/o cva H/o prostate cancer H/o LLE arterial occlusion with critical limb ischemia 10/25, s/p thrombectomy H/o GI bleed 2/2 duodenal ulcer 12/25 H/o perforated small bowel s/p Arlyss patch repair and lengthy TPN  Significant Hospital Events: Including procedures, antibiotic start and stop dates in addition to other pertinent events   Admitted to ICU 06/16/24 1/19 Intubated,  sedated, Afib RVR, on pressors  1/20 Pressor requirements improving, Afib 120s-130s, now following commands, still no BM- SMOG enema given. Stress dose steroids and flagyl  stopped 1/21 remains in A-fib but rate controlled.  Failed SBT.  Culture data still negative.  Added dobutamine  1/22 awake with stable respiratory status post extubation 1/23 remains on dobutamine  and HFNC, off pressors. Thora on left- 1200cc removed, not fully evacuated.  1/26 Remains on dobutamine  gtt at , [place] 67.3, continues to diurese with lasix    Interim History / Subjective:  No events Now on RA  Objective    Blood pressure (!) 110/58, pulse 94, temperature (!) 97.5 F (36.4 C), temperature source Oral, resp. rate (!) 21, height 6' 1 (1.854 m), weight 68.3 kg, SpO2 96%. CVP:  [2 mmHg-6 mmHg] 2 mmHg      Intake/Output Summary (Last 24 hours) at 06/26/2024 1048 Last data filed at 06/26/2024 0900 Gross per 24 hour  Intake 719.7 ml  Output 2450 ml  Net -1730.3 ml   Filed Weights   06/24/24 0407 06/25/24 0500 06/26/24 0500  Weight: 71.6 kg 70.7 kg 68.3 kg   Chronically ill appearing Very weak Better lung movement Ext warm Moves to command  Labs/imaging reviewed  Resolved problem list   Constipation Shock liver  Assessment and Plan   Acute hypoxic respiratory failure secondary to secondary to HAP vs aspiration concurrent with a component of pulmonary edema from CHF MRSA negative completed 7 days of antibiotics, monitor off antibiotics Mixed Shock- Septic now Cardiogenic component  Acute HFrEF 30-35%, RV normal Afib RVR -hx of chronic afib on eliquis   AKI> resolved; suspect was due to sepsis Acute on Chronic Anemia Hx of GI bleed- 05/04/24-EGD showing duodenal ulcer Hx of perforated duodenal ulcer-taken to OR on 12/9 s/p ex-lap, graham patch repair  RLE edema, asymmetric--- he reports chronic H/o severe PAD w/ Recent LE DVT s/p Thrombectomy on left Acute Ischemic Leg in 02/2024- s/p left  tibioperoneal trunk endarterectomy with vein patch angioplasty, and stenting of left SFA- on plavix  PTA  H/o HTN & HLD  Remote H/o ICH Severe protein energy malnutrition Dysphagia Acute encephalopathy, suspect ICU delirium- resolved  O2 needs improved Weaned off inotropes Diuretics being adjusted by AHF Only issue now is profound deconditioning   Mobilize as able Torsemide  per AHF Wife wants to talk to PT and TOC regarding disposition as they were unhappy with level of therapy offered at previous SNF Encourage PO Okay for eliquis  Transfer out of ICU, appreciate TRH taking over 06/27/24  Rolan Sharps MD PCCM  Rolan Sharps MD PCCM  "

## 2024-06-26 NOTE — Progress Notes (Signed)
" °  Daily Progress Note   Date: 06/26/2024   Patient Name: Kyle Kelley  DOB: 12-Oct-1945  MRN: [id number]  Age / Sex: 79 y.o., male  Attending Physician: Claudene Toribio BROCKS, MD Primary Care Physician: Charlott Dorn LABOR, MD Admit Date: 06/16/2024 Length of Stay: 10 days  Reason for Follow-up: {Reason for Consult:23484}  Past Medical History:  Diagnosis Date   Aortic stenosis    Moderate AS with mean AVG 10.8, V-max 2.33m/s, SVI 28, DI 0.3   Carotid artery stenosis    1-39% bilaterally by dopplers 11/2022   Dyslipidemia    Hypertension    Inguinal hernia 2010   Small asymptomatic L inguinal Hernia   Peripheral vascular disease    Permanent atrial fibrillation (HCC)    atrial fib    Pneumonia    hx of    Prostate cancer (HCC) 07/2012   s/p prostatectomy   PSA elevation    workup per Dr. Alm Fragmin, March 2014   Sigmoid diverticulosis    on colonoscopy in 2010   Skin cancer 02/2012   Dr Renay Homer   Stroke [facility])    Tobacco dependence    rarely smokes-3 cigs per week    Subjective:   Subjective: Chart Reviewed. Updates received. Patient Assessed. Created space and opportunity for patient  and family to explore thoughts and feelings regarding current medical situation.  Today's Discussion: Today before meeting with the patient/family, I reviewed the chart notes including ***. I also reviewed vital signs, nursing flowsheets, medication administrations record, labs, and imaging. Labs reviewed include ***.  ***  Review of Systems  Objective:   Primary Diagnoses: Present on Admission:  Acute hypoxemic respiratory failure (HCC)   Vital Signs:  BP (!) 125/59 (BP Location: Right Arm)   Pulse 95   Temp 97.7 F (36.5 C) (Oral)   Resp 20   Ht 6' 1 (1.854 m)   Wt 68.3 kg   SpO2 94%   BMI 19.87 kg/m   Physical Exam  Palliative Assessment/Data: ***   Existing Vynca/ACP Documentation: ***  Advanced Care Planning:   Existing Vynca/ACP  Documentation: ***  Primary Decision Maker: {Primary Decision Fjxzm:78612}  Pertinent diagnosis: ***  The patient and/or family consented to a voluntary Advance Care Planning Conversation in person/over the phone***. Individuals present for the conversation: ***  Summary of the conversation: ***  Outcome of the conversations and/or documents completed: ***  I spent *** minutes providing separately identifiable ACP services with the patient and/or surrogate decision maker in a voluntary, in-person conversation discussing the patient's wishes and goals as detailed in the above note.  Assessment & Plan:   HPI/Patient Profile:  ***  SUMMARY OF RECOMMENDATIONS   ***  Symptom Management:  ***  Code Status: {Updated Palliative Code Status:33307}  Prognosis: {Palliative Care Prognosis:23504}  Discharge Planning: {Palliative dispostion:23505}  Discussed with: ***  Thank you for allowing us  to participate in the care of [name] PMT will continue to support holistically.  Time Total: ***  Detailed review of medical records (labs, imaging, vital signs), medically appropriate exam, discussed with treatment team, counseling and education to patient, family, & staff, documenting clinical information, medication management, coordination of care  Camellia Kays, NP Palliative Medicine Team  Team Phone # [phone] (Nights/Weekends)  01/26/2021, 8:17 AM  "

## 2024-06-26 NOTE — Progress Notes (Signed)
 Inpatient Rehab Admissions Coordinator Note:   Per updated therapy recommendations patient was screened for CIR candidacy by Reche FORBES Lowers, PT. At this time, pt appears to be a potential candidate for CIR. I will place an order for rehab consult for full assessment, per our protocol.  Please contact me any with questions.[name] Reche Lowers, PT, DPT [phone] 06/26/24 4:29 PM

## 2024-06-26 NOTE — Progress Notes (Signed)
 Physical Therapy Treatment Patient Details Name: Kyle Kelley MRN: [id number] DOB: 1945-07-16 Today's Date: 06/26/2024   History of Present Illness 79 yo male presents from Rehab Facility 06/16/2024 for respiratory distress intubated on arrival.  Severe L-sided aspiration PNA and resultant septic shock.  PMHx: atrial fibrillation (on Eliquis /recently held), vasculopath, acute on chronic ischemic LLE nonhealing wound, HTN, HLD, aortic stenosis, CVA, prostate cancer s/p prostatectomy 2014, and PVD.    PT Comments  Pt reports increased fatigue and desire to take nap on entry, but is agreeable to attempt PT due to desire for AIR placement. Pt had just arrived from transfer from ICU. Pt does provide maximal effort and has improved mobility today especially with decreased O2 demand. Pt is currently modA for bed mobility, sit<>stand transfers and lateral steps towards HoB. Patient will benefit from intensive inpatient follow-up therapy, >3 hours/day. PT will continue to follow acutely and with Mobility Specialist will work on improving endurance.     If plan is discharge home, recommend the following: A lot of help with walking and/or transfers;A lot of help with bathing/dressing/bathroom;Assistance with cooking/housework;Direct supervision/assist for medications management;Direct supervision/assist for financial management;Assist for transportation;Help with stairs or ramp for entrance;Supervision due to cognitive status   Can travel by private vehicle     No  Equipment Recommendations  Hospital bed;Wheelchair (measurements PT);Wheelchair cushion (measurements PT);BSC/3in1    Recommendations for Other Services Rehab consult     Precautions / Restrictions Precautions Precautions: Fall Recall of Precautions/Restrictions: Intact Precaution/Restrictions Comments: central line Restrictions Weight Bearing Restrictions Per Provider Order: No     Mobility  Bed Mobility Overal bed mobility: Needs  Assistance Bed Mobility: Supine to Sit, Sit to Supine     Supine to sit: Mod assist, HOB elevated, Used rails Sit to supine: Total assist   General bed mobility comments: to bring BLE off EOB, elevate trunk. total A for BLE into bed and repositioning    Transfers Overall transfer level: Needs assistance Equipment used: 1 person hand held assist Transfers: Sit to/from Stand, Bed to chair/wheelchair/BSC Sit to Stand: Mod assist, From elevated surface          Lateral/Scoot Transfers: Mod assist General transfer comment: pt requiring modA and increased cuing to come to upright posture and for facilitating lateral stepping towards HoB    Ambulation/Gait               General Gait Details: unable to attempt today due to weakness.          Balance Overall balance assessment: Needs assistance Sitting-balance support: Bilateral upper extremity supported, Feet supported Sitting balance-Leahy Scale: Fair   Postural control: Posterior lean Standing balance support: Bilateral upper extremity supported, During functional activity, Reliant on assistive device for balance Standing balance-Leahy Scale: Poor Standing balance comment: increasingly heavy posterior lean in standing with HHA                            Communication Communication Communication: Impaired Factors Affecting Communication: Hearing impaired  Cognition Arousal: Lethargic (pt transferred from 2H to 6E today and is very tired) Behavior During Therapy: WFL for tasks assessed/performed   PT - Cognitive impairments: Safety/Judgement, Problem solving                       PT - Cognition Comments: continues to need increased cuing and time for task completion Following commands: Intact      Cueing Cueing Techniques:  Verbal cues, Gestural cues, Tactile cues  Exercises      General Comments General comments (skin integrity, edema, etc.): VSS on RA      Pertinent Vitals/Pain Pain  Assessment Pain Assessment: Faces Faces Pain Scale: Hurts a little bit Pain Location: generalized Pain Descriptors / Indicators: Grimacing, Guarding Pain Intervention(s): Limited activity within patient's tolerance, Monitored during session, Repositioned    Home Living Family/patient expects to be discharged to:: Inpatient rehab Living Arrangements: Spouse/significant other                          PT Goals (current goals can now be found in the care plan section) Acute Rehab PT Goals PT Goal Formulation: With patient/family Time For Goal Achievement: 07/04/24 Potential to Achieve Goals: Fair Progress towards PT goals: Progressing toward goals    Frequency    Min 3X/week       AM-PAC PT 6 Clicks Mobility   Outcome Measure  Help needed turning from your back to your side while in a flat bed without using bedrails?: A Lot Help needed moving from lying on your back to sitting on the side of a flat bed without using bedrails?: A Lot Help needed moving to and from a bed to a chair (including a wheelchair)?: A Lot Help needed standing up from a chair using your arms (e.g., wheelchair or bedside chair)?: A Lot Help needed to walk in hospital room?: Total Help needed climbing 3-5 steps with a railing? : Total 6 Click Score: 10    End of Session Equipment Utilized During Treatment: Gait belt;Oxygen Activity Tolerance: Patient limited by fatigue Patient left: with call bell/phone within reach;in bed;with bed alarm set Nurse Communication: Mobility status PT Visit Diagnosis: Other abnormalities of gait and mobility (R26.89);Unsteadiness on feet (R26.81);Muscle weakness (generalized) (M62.81);Difficulty in walking, not elsewhere classified (R26.2);Adult, failure to thrive (R62.7);Pain     Time: 1536-1550 PT Time Calculation (min) (ACUTE ONLY): 14 min  Charges:    $Therapeutic Activity: 8-22 mins PT General Charges $$ ACUTE PT VISIT: 1 Visit                      Ory Elting B. Kelley Lapidus PT, DPT Acute Rehabilitation Services Please use secure chat or  Call Office [phone]    Kyle Kelley [facility] 06/26/2024, 4:02 PM

## 2024-06-26 NOTE — Progress Notes (Signed)
 OT Cancellation Note  Patient Details Name: Kyle Kelley MRN: [id number] DOB: Jul 11, 1945   Cancelled Treatment:    Reason Eval/Treat Not Completed: Fatigue/lethargy limiting ability to participate. Pt just got to the recliner with RN staff. Asking Therapy staff to come back later. OT will continue to follow for treatment.   Of note: MD asking therapy team to consider AIR since patient is doing more and doing better.  Kyle Kelley 06/26/2024, 9:31 AM  Kyle DEL OTR/L Acute Rehabilitation Services Office: [phone]

## 2024-06-26 NOTE — Progress Notes (Addendum)
 "    Advanced Heart Failure Rounding Note  Cardiologist: Wilbert Bihari, MD  Chief Complaint: cardiogenic shock Subjective:   1/25 DBA cut back to 3 mcg. Diuresed with IV lasix .  1/26 Diuresed with IV lasix . Amio cut back to 30 mg per hour. Placed back on rocephin .  1/27 DBA weaned to 1.5 mcg. Diuresed with IV lasix  + diamox .  Remains on DBA 1.5 mcg. + Amio 30   CO-OX 85%.   Denies SOB.   Objective:   Weight Range: 68.3 kg Body mass index is 19.87 kg/m.   Vital Signs:   Temp:  [97.7 F (36.5 C)-98.6 F (37 C)] 97.8 F (36.6 C) (01/28 0402) Pulse Rate:  [71-162] 93 (01/28 0700) Resp:  [14-31] 25 (01/28 0700) BP: (66-118)/(29-84) 118/84 (01/28 0700) SpO2:  [72 %-100 %] 93 % (01/28 0700) Weight:  [68.3 kg] 68.3 kg (01/28 0500) Last BM Date : 06/24/24  Weight change: Filed Weights   06/24/24 0407 06/25/24 0500 06/26/24 0500  Weight: 71.6 kg 70.7 kg 68.3 kg    Intake/Output:   Intake/Output Summary (Last 24 hours) at 06/26/2024 0731 Last data filed at 06/26/2024 0700 Gross per 24 hour  Intake 705.43 ml  Output 1850 ml  Net -1144.57 ml  CVP 9-10   Physical Exam   General: Frail.   No resp difficulty. In bed. Neck: JVP 9-10  Cor: Irregular rate & rhythm.  Lungs: Decreased in the bases. On room air.  Abdomen: soft, nontender, nondistended.  Extremities: LLE dressing. R and  LLE 2-3 edema no  edema Neuro: alert & oriented x3   Telemetry   A fib 80-90s   Medications:     Scheduled Medications:  apixaban   5 mg Oral BID   ascorbic acid   500 mg Oral Daily   Chlorhexidine  Gluconate Cloth  6 each Topical Daily   clopidogrel   75 mg Oral Daily   collagenase    Topical Daily   digoxin   0.0625 mg Oral Daily   famotidine   20 mg Oral QHS   insulin  aspart  0-9 Units Subcutaneous TID AC & HS   lactose free nutrition  237 mL Oral TID BM   midodrine   5 mg Oral TID WC   multivitamin with minerals  1 tablet Oral Daily   polyethylene glycol  17 g Oral BID    senna-docusate  2 tablet Oral QHS   sodium chloride  flush  10-40 mL Intracatheter Q12H   spironolactone   12.5 mg Oral Daily   zinc  sulfate (50mg  elemental zinc )  220 mg Oral Daily    Infusions:  amiodarone  30 mg/hr (06/26/24 0700)   cefTRIAXone  (ROCEPHIN )  IV Stopped (06/25/24 1335)   DOBUTamine  1.5 mcg/kg/min (06/26/24 0700)   norepinephrine  (LEVOPHED ) Adult infusion Stopped (06/26/24 0232)    PRN Medications: haloperidol  lactate, ipratropium-albuterol , ondansetron  (ZOFRAN ) IV, sodium chloride  flush    Patient Profile   Kyle Kelley is a 79 y.o. male with a history of prostate cancer, CVA, multiple hospitalizations secondary to PAD, GI bleed perforated duodenal ulcer, wound dehiscence requiring skilled placement. Here with mixed shock.  Assessment/Plan   Mixed shock - cardiogenic/septic - Likely aspiration PNA and low coox with IV DBA at 5 - Echo 06/17/24 EF 30-35% - Echo 06/21/24 EF 40-45% - Suspect he has severe underlying CAD but no angina and not CABG candidate so medical therapy likely most appropriate at this time. Can consider ischemic eval down the road if significant recovery, but doubt it will change management - Palliative care following.  -  CO-OX 66%. Stop DBA - CVP 9-10. Given lasix  80 mg bid  - Increase midodrine  10 mg three times a day.  - Increase spiro 25 mg daily.   - Supp K.  - Add ace wrap to RLE.   Afib RVR on admit - Permanent A fib . Rate controlled.  -Stop amio. Off DBA today.   -Continue eliquis  5 mg twice a day.     Acute Hypoxic Respirator Failure HCAP versus Aspiration.  -Intubated on arrival.  -Extubated 06/19/24 -Now off oxygen with stable sats.   - improving with diuresis/thoracentesis.    Anemia   -HX of GI Bleed 05/04/24 EGD showing duodena ulcer - Hx of perforated duodenal ulcer-taken to OR on 12/9 s/p ex-lap, graham patch repair.  - hgb 7.8>7.8>7.8 - CCM managing   PAD , Ischemic Leg in 02/2024- s/p left tibioperoneal trunk  endarterectomy with vein patch angioplasty, and stenting of left SFA-  -LE DVT S/P Thrombectomy  -LLE full thickness wound. Continue wound care.    Aortic Stenosis  -Moderate previous ECHO. Calcification.  -Based on echo is not severe   Pleural Effusion,Bilateral  - s/p left thoracentesis on 1/23 with 1200 cc out -1/27 S/P R/L Thoracentesis    DNR/DNI - treat the treatable. No intubation or CPR - Palliative care consultation given mutiple comorbid medical conditions, severe frailty and malnutrition    CRITICAL CARE Performed by: Greig Mosses NP-C    Total critical care time:10  minutes  Critical care time was exclusive of separately billable procedures and treating other patients.  Critical care was necessary to treat or prevent imminent or life-threatening deterioration.  Critical care was time spent personally by me on the following activities: development of treatment plan with patient and/or surrogate as well as nursing, discussions with consultants, evaluation of patient's response to treatment, examination of patient, obtaining history from patient or surrogate, ordering and performing treatments and interventions, ordering and review of laboratory studies, ordering and review of radiographic studies, pulse oximetry and re-evaluation of patient's condition.   Length of Stay: 10  Greig Mosses, NP  06/26/2024, 7:31 AM  Advanced Heart Failure Team Pager [phone] (M-F; 7a - 5p)   Please visit [email or web address]: For overnight coverage please call cardiology fellow first. If fellow not available call Shock/ECMO MD on call.  For ECMO / Mechanical Support (Impella, IABP, LVAD) issues call Shock / ECMO MD on call.   Patient seen with NP, I formulated the plan and agree with the above note.   Co-ox 66% today, CVP < 5 on my read.  He remains on dobutamine  1.5.  He is on amiodarone  gtt with HR 80s in AF.  Creatinine stable 0.77.   S/p bilateral thoracenteses yesterday. He is now on room  air.   Feels fine.  Remains very weak/deconditioned.   Remains on ceftriaxone  for Peptostreptococcus, WBCs remain elevated at 20 but coming down.   General: NAD Neck: No JVD, no thyromegaly or thyroid  nodule.  Lungs: Clear to auscultation bilaterally with normal respiratory effort. CV: Nondisplaced PMI.  Heart irregular S1/S2, no S3/S4, no murmur.  2+ edema to knees.   Abdomen: Soft, nontender, no hepatosplenomegaly, no distention.  Skin: Intact without lesions or rashes.  Neurologic: Alert and oriented x 3.  Psych: Normal affect. Extremities: No clubbing or cyanosis.  HEENT: Normal.   Good co-ox, stop dobutamine  today.  Will need to increase midodrine  to 10 tid today.  With CVP < 5, no further IV Lasix . He was on torsemide  20 mg  daily prior to admission, will start torsemide  40 mg daily.   On amiodarone  gtt for rate control, will stop today and follow now that off dobutamine .  He has permanent AF and will be on digoxin  for rate control.  Continue apixaban .   Covering Peptostreptococcus with ceftriaxone .    Deconditioned, will need SNF.   CRITICAL CARE Performed by: Ezra Shuck  Total critical care time: 40 minutes  Critical care time was exclusive of separately billable procedures and treating other patients.  Critical care was necessary to treat or prevent imminent or life-threatening deterioration.  Critical care was time spent personally by me on the following activities: development of treatment plan with patient and/or surrogate as well as nursing, discussions with consultants, evaluation of patient's response to treatment, examination of patient, obtaining history from patient or surrogate, ordering and performing treatments and interventions, ordering and review of laboratory studies, ordering and review of radiographic studies, pulse oximetry and re-evaluation of patient's condition.  Ezra Shuck 06/26/2024 8:32 AM  "

## 2024-06-26 NOTE — Progress Notes (Signed)
 PT Cancellation Note  Patient Details Name: Kyle Kelley MRN: [id number] DOB: 12/05/1945   Cancelled Treatment:    Reason Eval/Treat Not Completed: (P) Other (comment) Pt just got up to the chair and is fatigued asking PT to come back later. PT will follow back for treatment later today as able.   Kyle Kelley PT, DPT Acute Rehabilitation Services Please use secure chat or  Call Office [phone]    Kyle Kelley [name] 06/26/2024, 9:37 AM

## 2024-06-26 NOTE — TOC Progression Note (Signed)
 Transition of Care [facility]) - Progression Note    Patient Details  Name: LUCIO LITSEY MRN: [id number] Date of Birth: [birth year]  Transition of Care [facility]) CM/SW Contact  Arlana JINNY Nicholaus ISRAEL Phone Number: [phone] 06/26/2024, 3:07 PM  Clinical Narrative:   CSW spoke with Randine at [facility] to notify that the patients EDD is about 2-3 days and would like to return to facility for STR.   HF CSW/CM will continue to follow and monitor for dc readiness.     Expected Discharge Plan: Skilled Nursing Facility Barriers to Discharge: Continued Medical Work up               Expected Discharge Plan and Services In-house Referral: Clinical Social Work     Living arrangements for the past 2 months: Single Family Home                                       Social Drivers of Health (SDOH) Interventions SDOH Screenings   Food Insecurity: No Food Insecurity (06/17/2024)  Housing: Low Risk (06/17/2024)  Transportation Needs: No Transportation Needs (06/17/2024)  Utilities: Not At Risk (06/17/2024)  Social Connections: Moderately Integrated (06/17/2024)  Tobacco Use: Medium Risk (06/26/2024)    Readmission Risk Interventions    05/06/2024   12:43 PM  Readmission Risk Prevention Plan  Transportation Screening Complete  PCP or Specialist Appt within 3-5 Days Complete  HRI or Home Care Consult Complete  Social Work Consult for Recovery Care Planning/Counseling Complete  Palliative Care Screening Complete  Medication Review Oceanographer) Referral to Pharmacy

## 2024-06-26 NOTE — Consult Note (Signed)
" °  CLINICAL SUPPORT TEAM - WOUND OSTOMY AND CONTINENCE TEAM  CONSULTATION SERVICES   WOC Nurse-Inpatient Note   WOC Nurse Consult Note: Reason for Consult: unstageable on sacrum with increased odor on it. May also have 2 new DTI's to ischial tuberosity areas bilaterally.  Wound type: Unstageable Pressure Injury; sacrum with new onset odor ? DTPI  Reviewed photo; new DTPI right posterior thigh, not on bony prominence; linear. Asking staff about presence of line or device under the thigh  Pressure Injury POA: Yes US  PI; new DTPI Measurement:see nursing flow sheets Wound bed:  Sacral wound; no acute changes in the amount of necrotic tissue, but staff reported increasing odor  Right posterior thigh; dark purple non blanchable tissue  Drainage (amount, consistency, odor)  see nursing flowsheets Periwound: intact  Dressing procedure/placement/frequency: Change Santyl  to chemical debridement with 1/4% Dakins daily, gauze and dry dressing.  Add PT for CSWD 2x wk (former hydro) On LALM in the ICU; add air mattress if patient transfers out of the ICU Chair pressure redistribution pad in place for when up in the chair.  Consult RD for wound supplementation  WOC Nurse team will follow with you and see patient within 10 days for wound assessments.  Please notify WOC nurses of any acute changes in the wounds or any new areas of concern [facility]ia [facility] MSN, RN,CWOCN, CNS, CWON-AP [phone]        "

## 2024-06-27 DIAGNOSIS — R6521 Severe sepsis with septic shock: Secondary | ICD-10-CM | POA: Diagnosis not present

## 2024-06-27 DIAGNOSIS — J9601 Acute respiratory failure with hypoxia: Secondary | ICD-10-CM | POA: Diagnosis not present

## 2024-06-27 DIAGNOSIS — A419 Sepsis, unspecified organism: Secondary | ICD-10-CM | POA: Diagnosis not present

## 2024-06-27 DIAGNOSIS — I4891 Unspecified atrial fibrillation: Secondary | ICD-10-CM | POA: Diagnosis not present

## 2024-06-27 DIAGNOSIS — R570 Cardiogenic shock: Secondary | ICD-10-CM | POA: Diagnosis not present

## 2024-06-27 LAB — URINALYSIS, ROUTINE W REFLEX MICROSCOPIC
Bilirubin Urine: NEGATIVE
Glucose, UA: NEGATIVE mg/dL
Hgb urine dipstick: NEGATIVE
Ketones, ur: NEGATIVE mg/dL
Leukocytes,Ua: NEGATIVE
Nitrite: NEGATIVE
Protein, ur: NEGATIVE mg/dL
Specific Gravity, Urine: 1.011 (ref 1.005–1.030)
pH: 7 (ref 5.0–8.0)

## 2024-06-27 LAB — COOXEMETRY PANEL
Carboxyhemoglobin: 1.8 % — ABNORMAL HIGH (ref 0.5–1.5)
Methemoglobin: 1 % (ref 0.0–1.5)
O2 Saturation: 61 %
Total hemoglobin: 8.3 g/dL — ABNORMAL LOW (ref 12.0–16.0)

## 2024-06-27 LAB — GLUCOSE, CAPILLARY
Glucose-Capillary: 215 mg/dL — ABNORMAL HIGH (ref 70–99)
Glucose-Capillary: 165 mg/dL — ABNORMAL HIGH (ref 70–99)
Glucose-Capillary: 85 mg/dL (ref 70–99)
Glucose-Capillary: 121 mg/dL — ABNORMAL HIGH (ref 70–99)

## 2024-06-27 LAB — FERRITIN: Ferritin: 53 ng/mL (ref 24–336)

## 2024-06-27 LAB — CBC
HCT: 26 % — ABNORMAL LOW (ref 39.0–52.0)
Hemoglobin: 7.9 g/dL — ABNORMAL LOW (ref 13.0–17.0)
MCH: 23 pg — ABNORMAL LOW (ref 26.0–34.0)
MCHC: 30.4 g/dL (ref 30.0–36.0)
MCV: 75.6 fL — ABNORMAL LOW (ref 80.0–100.0)
Platelets: 390 10*3/uL (ref 150–400)
RBC: 3.44 MIL/uL — ABNORMAL LOW (ref 4.22–5.81)
RDW: 20.2 % — ABNORMAL HIGH (ref 11.5–15.5)
WBC: 22 10*3/uL — ABNORMAL HIGH (ref 4.0–10.5)
nRBC: 0 % (ref 0.0–0.2)

## 2024-06-27 LAB — CULTURE, BLOOD (ROUTINE X 2)

## 2024-06-27 LAB — RETICULOCYTES
Immature Retic Fract: 32.2 % — ABNORMAL HIGH (ref 2.3–15.9)
RBC.: 3.44 MIL/uL — ABNORMAL LOW (ref 4.22–5.81)
Retic Count, Absolute: 59.5 10*3/uL (ref 19.0–186.0)
Retic Ct Pct: 1.7 % (ref 0.4–3.1)

## 2024-06-27 LAB — BASIC METABOLIC PANEL WITH GFR
Anion gap: 5 (ref 5–15)
BUN: 34 mg/dL — ABNORMAL HIGH (ref 8–23)
CO2: 35 mmol/L — ABNORMAL HIGH (ref 22–32)
Calcium: 8.2 mg/dL — ABNORMAL LOW (ref 8.9–10.3)
Chloride: 91 mmol/L — ABNORMAL LOW (ref 98–111)
Creatinine, Ser: 0.79 mg/dL (ref 0.61–1.24)
GFR, Estimated: >60 mL/min
Glucose, Bld: 102 mg/dL — ABNORMAL HIGH (ref 70–99)
Potassium: 3.5 mmol/L (ref 3.5–5.1)
Sodium: 131 mmol/L — ABNORMAL LOW (ref 135–145)

## 2024-06-27 LAB — IRON AND TIBC
Iron: 29 ug/dL — ABNORMAL LOW (ref 45–182)
Saturation Ratios: 10 % — ABNORMAL LOW (ref 17.9–39.5)
TIBC: 293 ug/dL (ref 250–450)
UIBC: 264 ug/dL

## 2024-06-27 LAB — FOLATE: Folate: 15.8 ng/mL

## 2024-06-27 LAB — MAGNESIUM: Magnesium: 2 mg/dL (ref 1.7–2.4)

## 2024-06-27 LAB — VITAMIN B12: Vitamin B-12: 1674 pg/mL — ABNORMAL HIGH (ref 180–914)

## 2024-06-27 MED ORDER — POTASSIUM CHLORIDE CRYS ER 20 MEQ PO TBCR: 40.0000 meq | EXTENDED_RELEASE_TABLET | ORAL | Status: DC

## 2024-06-27 MED ORDER — POTASSIUM CHLORIDE 20 MEQ PO PACK
40.0000 meq | PACK | Freq: Two times a day (BID) | ORAL | Status: AC
  Administered 2024-06-27 (×2): 40 meq via ORAL
  Filled 2024-06-27 (×2): qty 2

## 2024-06-27 NOTE — Progress Notes (Signed)
 Mobility Specialist Progress Note;    06/27/24 1545  Mobility  Activity Pivoted/transferred from chair to bed  Level of Assistance Maximum assist, patient does 25-49% (+2)  Assistive Device Stedy  Activity Response Tolerated well  Mobility Referral Yes  Mobility visit 1 Mobility (+2)  Mobility Specialist Start Time (ACUTE ONLY) 1545  Mobility Specialist Stop Time (ACUTE ONLY) 1614  Mobility Specialist Time Calculation (min) (ACUTE ONLY) 29 min   Pt received in chair, agreeable to mobility. MaxA +2 for STS, transferred to bed via stedy. Pt had slight BM, MS assisted in pericare. ModA for bed mobility. Pt had no complaints throughout. VSS throughout. Call bell and personal belongings in reach. Bed alarm on. All needs met. Wife in room. RN aware.   Florine Oak Mobility Specialist Please Neurosurgeon or [employer] [phone] '

## 2024-06-27 NOTE — Progress Notes (Signed)
 Inpatient Rehab Coordinator Note:  I spoke with patient's spouse over the phone (pt able to hear on speaker) to discuss CIR recommendations and goals/expectations of CIR stay.  We reviewed 3 hrs/day of therapy, physician follow up, and average length of stay 2 weeks (dependent upon progress) with goals of supervision.  We reviewed medicare benefits.  Pt has been at [facility] x4 weeks prior to this admission.  They're open to CIR, but pt not sure about intensity.  Would like to think about it.  I will continue to follow.   Reche Lowers, PT, DPT Admissions Coordinator [phone] 06/27/24 1:26 PM

## 2024-06-27 NOTE — Progress Notes (Signed)
 Physical Therapy Wound Treatment Patient Details  Name: Kyle Kelley MRN: [id number] Date of Birth: May 27, 1946  Today's Date: 06/27/2024 Time: 1000-1100 Time Calculation (min): 60 min  Subjective  Subjective Assessment Subjective: Pt pleasant and agreeable to physical therapy session. Patient and Family Stated Goals: heal wound Date of Onset:  (Unsure) Prior Treatments: dressing changes  Pain Score:  Pt did not complain of pain throughout session.   Wound Assessment  Wound 06/16/24 1900 Pressure Injury Sacrum Mid Unstageable - Full thickness tissue loss in which the base of the injury is covered by slough (yellow, tan, gray, green or brown) and/or eschar (tan, brown or black) in the wound bed. (Active)  Wound Image   06/27/24 1221  Site / Wound Assessment Pink;Black;Yellow 06/27/24 1221  Peri-wound Assessment Pink;Purple;Maceration;Induration;Erythema (blanchable) 06/27/24 1221  Wound Length (cm) 5.6 cm 06/27/24 1221  Wound Width (cm) 5.2 cm 06/27/24 1221  Wound Surface Area (cm^2) 22.87 cm^2 06/27/24 1221  Wound Depth (cm) 3.2 cm 06/27/24 1221  Wound Volume (cm^3) 48.791 cm^3 06/27/24 1221  Drainage Description Purulent;Odor - foul 06/27/24 1221  Drainage Amount Large 06/27/24 1221  Treatment Debridement (Selective);Irrigation;Other (Comment) 06/27/24 1221  Dressing Type Foam - Lift dressing to assess site every shift;Gauze (Comment);Barrier Film (skin prep);Moist to moist;Dakin's-soaked gauze 06/27/24 1221  Dressing Changed Changed 06/27/24 1221  Dressing Status Clean, Dry, Intact 06/27/24 1221  State of Healing Eschar 06/27/24 1221  % Wound base Red or Granulating 5% 06/27/24 1221  % Wound base Yellow/Fibrinous Exudate 5% 06/27/24 1221  % Wound base Black/Eschar 90% 06/27/24 1221  % Wound base Other/Granulation Tissue (Comment) 0% 06/27/24 1221  Undermining (cm) Measured from the small opening at the inferior aspect of the wound: 2.5 cm at 6:00, and up to 4.0 cm from  6:30-5:30 06/27/24 1221  Margins Unattached edges (unapproximated) 06/27/24 1221  Non-staged Wound Description Full thickness 06/27/24 1221      Selective Debridement (non-excisional) Selective Debridement (non-excisional) - Location: Sacrum Selective Debridement (non-excisional) - Tools Used: Forceps, Scalpel Selective Debridement (non-excisional) - Tissue Removed: eschar    Wound Assessment and Plan  Wound Therapy - Assess/Plan/Recommendations Wound Therapy - Clinical Statement: This patient presents to wound therapy with an unstagable pressure injury to the sacrum. Debridement initiated and top layer of eschar able to be removed. Large amounts of purulence noted within the wound, and bone was palpable. Bone feels fragile and brittle to the touch, and care was taken to debride around the area. This patient will benefit from continued wound therapy for selective removal of unviable tissue, to decrease bioburden, and promote wound bed healing. Wound Therapy - Functional Problem List: Decreased tolerance for positional changes due to sacral wound. Factors Delaying/Impairing Wound Healing: Immobility, Multiple medical problems, Polypharmacy Hydrotherapy Plan: Debridement, Dressing change, Patient/family education, Pulsatile lavage with suction Wound Therapy - Frequency: 2X / week Wound Therapy - Follow Up Recommendations: dressing changes by RN  Wound Therapy Goals- Improve the function of patient's integumentary system by progressing the wound(s) through the phases of wound healing (inflammation - proliferation - remodeling) by: Wound Therapy Goals - Improve the function of patient's integumentary system by progressing the wound(s) through the phases of wound healing by: Decrease Necrotic Tissue to: 20% Decrease Necrotic Tissue - Progress: Goal set today Increase Granulation Tissue to: 80% Increase Granulation Tissue - Progress: Goal set today Goals/treatment plan/discharge plan were made  with and agreed upon by patient/family: Yes Time For Goal Achievement: 7 days Wound Therapy - Potential for Goals: Fair  Goals  will be updated until maximal potential achieved or discharge criteria met.  Discharge criteria: when goals achieved, discharge from hospital, MD decision/surgical intervention, no progress towards goals, refusal/missing three consecutive treatments without notification or medical reason.  GP     Charges PT Wound Care Charges $Wound Debridement up to 20 cm: < or equal to 20 cm $ Wound Debridement each add'l 20 sqcm: 1 $PT Hydrotherapy Dressing: 1 dressing $PT Hydrotherapy Visit: 1 Visit       Leita JONETTA Sable 06/27/2024, 12:43 PM  Leita Sable, PT, DPT Acute Rehabilitation Services Secure Chat Preferred Office: [phone]

## 2024-06-27 NOTE — Progress Notes (Signed)
 "    Advanced Heart Failure Rounding Note  Cardiologist: Wilbert Bihari, MD  Chief Complaint: cardiogenic shock Subjective:   1/25 DBA cut back to 3 mcg. Diuresed with IV lasix .  1/26 Diuresed with IV lasix . Amio cut back to 30 mg per hour. Placed back on rocephin .  1/27 DBA weaned to 1.5 mcg. Diuresed with IV lasix  + diamox .  Now off DBA and amio.   CO-OX 61%.   Wife at bedside. Feels good this morning. Denies CP/SOB.   Objective:   Weight Range: 65.6 kg Body mass index is 19.08 kg/m.   Vital Signs:   Temp:  [97.7 F (36.5 C)-98.9 F (37.2 C)] 98.1 F (36.7 C) (01/29 0748) Pulse Rate:  [85-103] 103 (01/29 0748) Resp:  [16-28] 16 (01/29 0748) BP: (102-125)/(44-99) 114/65 (01/29 0748) SpO2:  [92 %-99 %] 95 % (01/29 0748) Weight:  [65.6 kg-68.3 kg] 65.6 kg (01/29 0527) Last BM Date : 06/26/24  Weight change: Filed Weights   06/26/24 0500 06/27/24 0500 06/27/24 0527  Weight: 68.3 kg 68.3 kg 65.6 kg    Intake/Output:   Intake/Output Summary (Last 24 hours) at 06/27/2024 0838 Last data filed at 06/27/2024 0748 Gross per 24 hour  Intake 700 ml  Output 1750 ml  Net -1050 ml   Physical Exam  General:  chronically ill/ elderly / frail appearing.  No respiratory difficulty Neck: JVD ~7 cm.  Cor: Irregular rate & rhythm. No murmurs. Lungs: clear Extremities: L leg wrapped, + 1 L ankle edema. RLE +2 edema Neuro: alert & oriented x 3. Affect pleasant.   Telemetry   A fib low 100s (Personally reviewed)    Medications:     Scheduled Medications:  apixaban   5 mg Oral BID   ascorbic acid   500 mg Oral Daily   Chlorhexidine  Gluconate Cloth  6 each Topical Daily   clopidogrel   75 mg Oral Daily   collagenase    Topical Daily   dapagliflozin  propanediol  10 mg Oral Daily   digoxin   0.0625 mg Oral Daily   famotidine   20 mg Oral QHS   insulin  aspart  0-9 Units Subcutaneous TID AC & HS   lactose free nutrition  237 mL Oral TID BM   midodrine   10 mg Oral TID WC    multivitamin with minerals  1 tablet Oral Daily   polyethylene glycol  17 g Oral BID   senna-docusate  2 tablet Oral QHS   sodium chloride  flush  10-40 mL Intracatheter Q12H   sodium hypochlorite   Irrigation Daily   spironolactone   25 mg Oral Daily   torsemide   40 mg Oral Daily   zinc  sulfate (50mg  elemental zinc )  220 mg Oral Daily    Infusions:  cefTRIAXone  (ROCEPHIN )  IV Stopped (06/26/24 1204)    PRN Medications: haloperidol  lactate, ipratropium-albuterol , ondansetron  (ZOFRAN ) IV, sodium chloride  flush    Patient Profile   Kyle Kelley is a 79 y.o. male with a history of prostate cancer, CVA, multiple hospitalizations secondary to PAD, GI bleed perforated duodenal ulcer, wound dehiscence requiring skilled placement. Here with mixed shock.  Assessment/Plan   Mixed shock - cardiogenic/septic - Likely aspiration PNA and low coox with IV DBA at 5 - Echo 06/17/24 EF 30-35% - Echo 06/21/24 EF 40-45% - Suspect he has severe underlying CAD but no angina and not CABG candidate so medical therapy likely most appropriate at this time. Can consider ischemic eval down the road if significant recovery, but doubt it will change management -  Palliative care following.  - CO-OX 61%. Stable off DBA - Now on PO Torsemide  40 mg daily.  - Continue midodrine  10 mg three times a day.  - Continue digoxin  0.0625 mg daily. Check level tomorrow.  - Continue spiro 25 mg daily.   - Stop SGLT2i with pressure injuries.  - Supp K.  - Blood cx with peptostreptococcus, plan to get ID involved today per primary team - Place TED hose to RLE.   Afib RVR on admit - Permanent A fib. Rate controlled.  - Now off amio as he's off DBA.  - Continue eliquis  5 mg twice a day.     Acute Hypoxic Respirator Failure HCAP versus Aspiration.  -Intubated on arrival.  -Extubated 06/19/24 -Now off oxygen with stable sats.   - improving with diuresis/thoracentesis.    Anemia   -HX of GI Bleed 05/04/24 EGD showing  duodena ulcer - Hx of perforated duodenal ulcer-taken to OR on 12/9 s/p ex-lap, graham patch repair.  - hgb 7.8>7.8>7.8>7.9 - Primary managing   PAD , Ischemic Leg in 02/2024- s/p left tibioperoneal trunk endarterectomy with vein patch angioplasty, and stenting of left SFA-  -LE DVT S/P Thrombectomy  -LLE full thickness wound. Continue wound care.    Aortic Stenosis  -Moderate previous ECHO. Calcification.  -Based on echo is not severe   Pleural Effusion,Bilateral  - s/p left thoracentesis on 1/23 with 1200 cc out -1/27 S/P R/L Thoracentesis    DNR/DNI - treat the treatable. No intubation or CPR - Palliative care consultation given mutiple comorbid medical conditions, severe frailty and malnutrition  Plan for SNF at discharge.   Length of Stay: [address], NP  06/27/2024, 8:38 AM  Advanced Heart Failure Team Pager [phone] (M-F; 7a - 5p)   Please visit [email or web address]: For overnight coverage please call cardiology fellow first. If fellow not available call Shock/ECMO MD on call.  For ECMO / Mechanical Support (Impella, IABP, LVAD) issues call Shock / ECMO MD on call.   "

## 2024-06-27 NOTE — Progress Notes (Addendum)
 [facility] PROGRESS NOTE    Progress Note  Kyle Kelley  FMW:[id number] DOB: 25-Mar-1946 DOA: 06/16/2024 PCP: Charlott Dorn LABOR, MD     Brief Narrative:   Kyle Kelley is an 79 y.o. male past medical history of chronic atrial fibrillation on anticoagulation, aortic stenosis, history of perforated duodenal ulcer with a Midwife, with complications of wound dehiscence and wound VAC placement who was sent to skilled nursing facility, was brought in by EMS for respiratory distress EMS found him unresponsive wheezing, also in A-fib with RVR on arrival was emergently intubated for airway protection unresponsive to fluid and started on pressors mated to the ICU    Significant Events: Admitted to ICU 06/16/24 1/19 Intubated, sedated, Afib RVR, on pressors  1/20 Pressor requirements improving, Afib 120s-130s, now following commands, still no BM- SMOG enema given. Stress dose steroids and flagyl  stopped 1/21 remains in A-fib but rate controlled.  Failed SBT.  Culture data still negative.  Added dobutamine  1/22 awake with stable respiratory status post extubation 1/23 remains on dobutamine  and HFNC, off pressors. Thora on left- 1200cc removed, not fully evacuated.  1/26 Remains on dobutamine  gtt at , Coox 67.3, continues to diurese with lasix     Assessment/Plan:   Acute hypoxemic respiratory failure (HCC) secondary to healthcare associated pneumonia with a component of pulmonary edema from heart failure: Initially required fluid resuscitation, pressors and IV fluids. He completed 7-day course of antibiotics. He was weaned off pressors. Started on IV diuresis, eventually extubated. Now satting greater 92% on room air. Family agreed for the patient to be DNR/DNI.  He will have a lengthy and rocky recovery. Live care was consulted due to his severe fragility.  Mixed picture initially septic shock now cardiogenic component: Advanced heart failure team was consulted  started on dobutamine , Aldactone  and  Lasix  drip. Started on midodrine  10 mg 3 times a day for pressure support.    Paroxysmal atrial fibrillation: Initially started on amiodarone  drip and heparin . Transition to Eliquis  and digoxin . He remains rate controlled.  Leukocytosis/1 out of 2 blood cultures positive for PEPTOSTREPTOCOCCUS  ASACCHAROLYTICUS Abnormal  : Currently on IV Rocephin .  Leukocytosis continues to rise he has remained afebrile. Surveillance blood cultures have been negative till date. Discontinue central line, insert peripheral IV Will discuss with ID.  Acute kidney injury: Likely due to sepsis. With a baseline creatinine of less than 1, peaked at 1.4 now improved to baseline.  Bilateral pleural effusion: Status post thoracocentesis on 06/21/2024.  Then again on 06/26/2023.  Acute on chronic microcytic anemia/with a history of GI bleed secondary to a perforated duodenal ulcer: Hemo-globin is stable at around 7.9. MCV is low RDW is high check an anemia panel.  History of severe PAD with a recent lower extremity DVT status post thrombectomy on the left/acute ischemic leg in October 2025 status post left anterior peritoneal endarterectomy with vein patch and stenting: Lower extremity Doppler was negative. Remains on Eliquis  and Plavix . Continue wound care.  Essential hypertension/hyperlipidemia: Antihypertensive medications are on hold he is currently on midodrine  due to borderline blood pressure. Continue statins.  Remote history of ICH: Continue to monitor neurological status while on anticoagulation.  Hyperglycemia: Relatively well-controlled on Jardiance and sliding scale insulin .  Hypokalemia/hypomagnesemia: Try to keep potassium greater than 4 magnesium  greater than 2. Potassium is 3.5 replete orally recheck in the morning.  Severe protein caloric malnutrition: Continue regular diet.  Acute confusional state: Now improved.  Severe  deconditioning/severe protein caloric malnutrition: PT  OT has been consulted, will need skilled nursing facility placement. Out of bed to chair.  Unstageable sacral decubitus ulcer present on admission: RN Pressure Injury Documentation: Wound 06/16/24 1900 Pressure Injury Sacrum Mid Unstageable - Full thickness tissue loss in which the base of the injury is covered by slough (yellow, tan, gray, green or brown) and/or eschar (tan, brown or black) in the wound bed. (Active)     Wound 06/26/24 1041 Pressure Injury Thigh Posterior;Proximal;Right Deep Tissue Pressure Injury - Purple or maroon localized area of discolored intact skin or blood-filled blister due to damage of underlying soft tissue from pressure and/or shear. (Active)     DVT prophylaxis: Eliquis  Family Communication:wife Status is: Inpatient Remains inpatient appropriate because: Septic shock    Code Status:     Code Status Orders  (From admission, onward)           Start     Ordered   06/17/24 1212  Do not attempt resuscitation (DNR)- Limited -Do Not Intubate (DNI)  (Code Status)  Continuous       Question Answer Comment  If pulseless and not breathing No CPR or chest compressions.   In Pre-Arrest Conditions (Patient Is Breathing and Has A Pulse) Do not intubate. Provide all appropriate non-invasive medical interventions. Avoid ICU transfer unless indicated or required.   Consent: Discussion documented in EHR or advanced directives reviewed      06/17/24 1212           Code Status History     Date Active Date Inactive Code Status Order ID Comments User Context   06/16/2024 2022 06/17/2024 1212 Full Code [id number]  Layman Raisin, DO ED   05/03/2024 1251 05/20/2024 1722 Full Code [id number]  Willette Adriana LABOR, MD Inpatient   03/26/2024 0835 03/28/2024 1733 Full Code [id number]  [place] Lucie PARAS, PA-C Inpatient   03/26/2024 0558 03/26/2024 0835 Full Code [id number]  Franky Redia SAILOR, MD Inpatient    02/28/2024 0235 02/29/2024 2105 Full Code [id number]  Sallyann Normie HERO, MD ED   09/02/2020 2031 09/03/2020 2034 Full Code [id number]  Hilma Rankins, MD Inpatient      Advance Directive Documentation    Flowsheet Row Most Recent Value  Type of Advance Directive Healthcare Power of Attorney  Pre-existing out of facility DNR order (yellow form or pink MOST form) --  MOST Form in Place? --      IV Access:   Peripheral IV   Procedures and diagnostic studies:   DG Chest Port 1 View Result Date: 06/25/2024 EXAM: 1 VIEW(S) XRAY OF THE CHEST 06/25/2024 10:45:00 AM COMPARISON: 06/21/2024 CLINICAL HISTORY: Status post thoracentesis. FINDINGS: LINES, TUBES AND DEVICES: Unchanged right IJ approach central venous catheter terminating in the high right atrium. LUNGS AND PLEURA: Unchanged left apical pleural thickening. Improved aeration of lung bases with persistent left upper lobe airspace opacities. Trace bilateral pleural effusions. No pneumothorax. HEART AND MEDIASTINUM: Aortic atherosclerosis. No acute abnormality of the cardiac and mediastinal silhouettes. BONES AND SOFT TISSUES: No acute osseous abnormality. IMPRESSION: 1. Improved aeration of lung bases with persistent left upper lobe airspace opacities. 2. Trace bilateral pleural effusions, significantly improved in the interim. Electronically signed by: Rogelia Myers MD 06/25/2024 11:26 AM EST RP Workstation: [id number]     Medical Consultants:   None.   Subjective:    Kyle Kelley remains symptom-free.  Objective:    Vitals:   06/26/24 1930 06/27/24 0017 06/27/24 0500 06/27/24 0527  BP: (!) 106/58 114/60  108/66  Pulse: 93  93  Resp: 18 (!) 21  (!) 23  Temp: 98.9 F (37.2 C) 98.4 F (36.9 C)  98.4 F (36.9 C)  TempSrc: Axillary Axillary  Axillary  SpO2: 97%     Weight:   68.3 kg 65.6 kg  Height:       SpO2: 97 % O2 Flow Rate (L/min): 1 L/min FiO2 (%): 45 %   Intake/Output Summary (Last 24 hours) at 06/27/2024 0635 Last  data filed at 06/27/2024 0534 Gross per 24 hour  Intake 132.66 ml  Output 1900 ml  Net -1767.34 ml   Filed Weights   06/26/24 0500 06/27/24 0500 06/27/24 0527  Weight: 68.3 kg 68.3 kg 65.6 kg    Exam: General exam: In no acute distress. Respiratory system: Good air movement and clear to auscultation. Cardiovascular system: S1 & S2 heard, RRR. No JVD. Gastrointestinal system: Abdomen is nondistended, soft and nontender.  Extremities: No pedal edema. Skin: No rashes, lesions or ulcers Psychiatry: Judgement and insight appear normal. Mood & affect appropriate.    Data Reviewed:    Labs: Basic Metabolic Panel: Recent Labs  Lab 06/21/24 0336 06/22/24 0502 06/24/24 0400 06/24/24 1658 06/25/24 0502 06/26/24 0507 06/27/24 0411  NA 139   < > 133* 132* 133* 133* 131*  K 3.3*   < > 3.0* 4.0 3.4* 3.5 3.5  CL 96*   < > 87* 85* 87* 91* 91*  CO2 33*   < > 41* 41* 42* 37* 35*  GLUCOSE 98   < > 106* 186* 100* 91 102*  BUN 48*   < > 29* 29* 27* 31* 34*  CREATININE 0.85   < > 0.60* 0.78 0.71 0.77 0.79  CALCIUM  7.6*   < > 7.5* 7.9* 8.0* 8.1* 8.2*  MG 2.4  --  1.6*  --  2.2 2.0 2.0   < > = values in this interval not displayed.   GFR Estimated Creatinine Clearance: 72.9 mL/min (by C-G formula based on SCr of 0.79 mg/dL). Liver Function Tests: Recent Labs  Lab 06/20/24 1652 06/21/24 2323  AST 230*  --   ALT 444*  --   ALKPHOS 105  --   BILITOT 0.5  --   PROT 6.3* 6.1*  ALBUMIN  2.2* 2.3*   No results for input(s): LIPASE, AMYLASE in the last 168 hours. No results for input(s): AMMONIA in the last 168 hours. Coagulation profile No results for input(s): INR, PROTIME in the last 168 hours. COVID-19 Labs  No results for input(s): DDIMER, FERRITIN, LDH, CRP in the last 72 hours.  Lab Results  Component Value Date   SARSCOV2NAA NEGATIVE 06/16/2024   SARSCOV2NAA NEGATIVE 09/02/2020    CBC: Recent Labs  Lab 06/23/24 0431 06/24/24 0400 06/25/24 0502  06/26/24 0507 06/27/24 0411  WBC 11.3* 12.4* 17.7* 20.4* 22.0*  HGB 7.6* 7.8* 7.8* 7.8* 7.9*  HCT 25.1* 26.8* 26.0* 26.0* 26.0*  MCV 78.2* 78.4* 77.2* 76.2* 75.6*  PLT 257 296 312 358 390   Cardiac Enzymes: No results for input(s): CKTOTAL, CKMB, CKMBINDEX, TROPONINI in the last 168 hours. BNP (last 3 results) Recent Labs    06/20/24 0346 06/21/24 0336 06/22/24 0502  PROBNP 6,984.0* 4,584.0* 3,305.0*   CBG: Recent Labs  Lab 06/25/24 2107 06/26/24 9386 06/26/24 1222 06/26/24 1546 06/26/24 2119  GLUCAP 252* 75 125* 76 140*   D-Dimer: No results for input(s): DDIMER in the last 72 hours. Hgb A1c: No results for input(s): HGBA1C in the last 72 hours. Lipid Profile: No results for input(s):  CHOL, HDL, LDLCALC, TRIG, CHOLHDL, LDLDIRECT in the last 72 hours. Thyroid  function studies: No results for input(s): TSH, T4TOTAL, T3FREE, THYROIDAB in the last 72 hours.  Invalid input(s): FREET3 Anemia work up: No results for input(s): VITAMINB12, FOLATE, FERRITIN, TIBC, IRON, RETICCTPCT in the last 72 hours. Sepsis Labs: Recent Labs  Lab 06/24/24 0400 06/25/24 0502 06/26/24 0507 06/27/24 0411  WBC 12.4* 17.7* 20.4* 22.0*   Microbiology Recent Results (from the past 240 hours)  Body fluid culture w Gram Stain     Status: None   Collection Time: 06/21/24  9:14 PM   Specimen: Pleura; Body Fluid  Result Value Ref Range Status   Specimen Description PLEURAL  Final   Special Requests NONE  Final   Gram Stain   Final    RARE WBC PRESENT, PREDOMINANTLY PMN NO ORGANISMS SEEN    Culture   Final    NO GROWTH 3 DAYS Performed at [facility], [address] [place], [place] [address]    Report Status 06/24/2024 FINAL  Final  Culture, blood (Routine X 2) w Reflex to ID Panel     Status: None (Preliminary result)   Collection Time: 06/24/24 11:38 AM   Specimen: BLOOD  Result Value Ref Range Status   Specimen Description  BLOOD SITE NOT SPECIFIED  Final   Special Requests   Final    BOTTLES DRAWN AEROBIC AND ANAEROBIC Blood Culture adequate volume   Culture   Final    NO GROWTH 2 DAYS Performed at [facility], [address] [place], [place] [address]    Report Status PENDING  Incomplete     Medications:    apixaban   5 mg Oral BID   ascorbic acid   500 mg Oral Daily   Chlorhexidine  Gluconate Cloth  6 each Topical Daily   clopidogrel   75 mg Oral Daily   collagenase    Topical Daily   dapagliflozin  propanediol  10 mg Oral Daily   digoxin   0.0625 mg Oral Daily   famotidine   20 mg Oral QHS   insulin  aspart  0-9 Units Subcutaneous TID AC & HS   lactose free nutrition  237 mL Oral TID BM   midodrine   10 mg Oral TID WC   multivitamin with minerals  1 tablet Oral Daily   polyethylene glycol  17 g Oral BID   senna-docusate  2 tablet Oral QHS   sodium chloride  flush  10-40 mL Intracatheter Q12H   sodium hypochlorite   Irrigation Daily   spironolactone   25 mg Oral Daily   torsemide   40 mg Oral Daily   zinc  sulfate (50mg  elemental zinc )  220 mg Oral Daily   Continuous Infusions:  cefTRIAXone  (ROCEPHIN )  IV Stopped (06/26/24 1204)      LOS: 11 days   Erle Odell Castor  [facility]  06/27/2024, 6:35 AM

## 2024-06-27 NOTE — Plan of Care (Signed)
  Problem: Education: Goal: Ability to describe self-care measures that may prevent or decrease complications (Diabetes Survival Skills Education) will improve Outcome: Progressing Goal: Individualized Educational Video(s) Outcome: Progressing   Problem: Coping: Goal: Ability to adjust to condition or change in health will improve Outcome: Progressing   Problem: Fluid Volume: Goal: Ability to maintain a balanced intake and output will improve Outcome: Progressing   Problem: Health Behavior/Discharge Planning: Goal: Ability to identify and utilize available resources and services will improve Outcome: Progressing Goal: Ability to manage health-related needs will improve Outcome: Progressing   Problem: Metabolic: Goal: Ability to maintain appropriate glucose levels will improve Outcome: Progressing   Problem: Nutritional: Goal: Maintenance of adequate nutrition will improve Outcome: Progressing Goal: Progress toward achieving an optimal weight will improve Outcome: Progressing   Problem: Skin Integrity: Goal: Risk for impaired skin integrity will decrease Outcome: Progressing   Problem: Tissue Perfusion: Goal: Adequacy of tissue perfusion will improve Outcome: Progressing   Problem: Activity: Goal: Ability to tolerate increased activity will improve Outcome: Progressing   Problem: Respiratory: Goal: Ability to maintain a clear airway and adequate ventilation will improve Outcome: Progressing   Problem: Role Relationship: Goal: Method of communication will improve Outcome: Progressing   Problem: Education: Goal: Knowledge of General Education information will improve Description: Including pain rating scale, medication(s)/side effects and non-pharmacologic comfort measures Outcome: Progressing   Problem: Health Behavior/Discharge Planning: Goal: Ability to manage health-related needs will improve Outcome: Progressing   Problem: Clinical Measurements: Goal:  Ability to maintain clinical measurements within normal limits will improve Outcome: Progressing Goal: Will remain free from infection Outcome: Progressing Goal: Diagnostic test results will improve Outcome: Progressing Goal: Respiratory complications will improve Outcome: Progressing Goal: Cardiovascular complication will be avoided Outcome: Progressing   Problem: Activity: Goal: Risk for activity intolerance will decrease Outcome: Progressing   Problem: Nutrition: Goal: Adequate nutrition will be maintained Outcome: Progressing   Problem: Coping: Goal: Level of anxiety will decrease Outcome: Progressing   Problem: Elimination: Goal: Will not experience complications related to bowel motility Outcome: Progressing Goal: Will not experience complications related to urinary retention Outcome: Progressing   Problem: Pain Managment: Goal: General experience of comfort will improve and/or be controlled Outcome: Progressing   Problem: Safety: Goal: Ability to remain free from injury will improve Outcome: Progressing   Problem: Skin Integrity: Goal: Risk for impaired skin integrity will decrease Outcome: Progressing   Problem: Safety: Goal: Non-violent Restraint(s) Outcome: Progressing

## 2024-06-27 NOTE — Progress Notes (Signed)
 Nutrition Follow-up  DOCUMENTATION CODES:   Severe malnutrition in context of chronic illness  INTERVENTION:   -Continue regular diet -Continue Boost Plus TID, each supplement provides 360 kcals and 14 grams protein -Continue Magic cup TID with meals, each supplement provides 290 kcal and 9 grams of protein  -Continue MVI with minerals daily -Continue 500 mg vitamin C  daily  -Continue 220 mg zinc  sulfate daily x 14 days  -30 ml Prosource Plus BID, each supplement provides 100 kcals and 15 grams protein   NUTRITION DIAGNOSIS:   Severe Malnutrition related to chronic illness as evidenced by severe muscle depletion, severe fat depletion, edema.  Ongoing  GOAL:   Patient will meet greater than or equal to 90% of their needs  Progressing   MONITOR:   PO intake, Labs, Weight trends  REASON FOR ASSESSMENT:   Consult Wound healing  ASSESSMENT:   79 yo male admitted with acute respiratory failure with pneumonia (aspiration vs HCAP) and CHF requiring intubation, +shock (mixed: cardiogenic and septic). Pt with recent hx of GI bleed with  perforated ulcer requiring graham patch repair and TPN for nutrition in December of 2025, acute left leg ischemia requiring stent in October 2025. PMH includes HTN, prostate cancer, anemia, malnutrition, PVD/PAD  1/21- NGT d/c 1/27 Thoracentesis with 2.2L removed  1/29- s/p BSE- regular diet with thin liquids  Reviewed I/O's: -1.8 L x 24 hours and -5.5 L since admission  UOP: 1.9 L x 24 hours  Patient unavailable at time of visit. Attempted to speak with patient via call to hospital room phone, however, unable to reach. RD unable to obtain further nutrition-related history or complete nutrition-focused physical exam at this time.    Per [facility] notes, patient with unstageable sacral pressure injury and new DPTI to right posterior thigh.   Case discussed with RN, who reports patient is working hard to eat as much as he can. Patient is consuming  about 50% of meals. Per meal compeltion records, he consumed 50% of breakfast this morning (344 kcals and 12 grams protein). He is drinking Boost supplements and eating Magic Cups.   Reviewed weight; noted weights have ranged from 65.6-76.1 kg over the past 7 days. Weights difficult to interpret given diuresis and history of CHF. Patient with moderate edema per RN notes and noted -5.5 L since admission.   CIR following for possible admission. Per TOC notes, patient is also open to returning to [facility].   Palliative care following for goals of care. Patient is DNR (limited) and plan to continue current scope of care at this time.   Medications reviewed and include vitamin C , pepcid , midodrine , miralax , potassium chloride , senokot, aldactone , demadex , and zinc  sulfate.   Labs reviewed: Na: 131, CBGS: 75-252 (inpatient orders for glycemic control are 0-9 units insulin  aspart TID with meals and at bedtime).    Diet Order:   Diet Order             Diet regular Room service appropriate? Yes with Assist; Fluid consistency: Thin  Diet effective now                   EDUCATION NEEDS:   Education needs have been addressed  Skin:  Skin Assessment: Skin Integrity Issues: Skin Integrity Issues:: DTI DTI: right posterior thigh Unstageable: sacrum (image noted in chart)  Last BM:  06/26/24  Height:   Ht Readings from Last 1 Encounters:  06/16/24 6' 1 (1.854 m)    Weight:   Wt Readings  from Last 1 Encounters:  06/27/24 65.6 kg   BMI:  Body mass index is 19.08 kg/m.  Estimated Nutritional Needs:   Kcal:  2100-2300  Protein:  115-130 grams  Fluid:  2.0-2.2 L    Margery ORN, RD, LDN, CDCES Registered Dietitian III Certified Diabetes Care and Education Specialist If unable to reach this RD, please use RD Inpatient group chat on secure chat between hours of 8am-4 pm daily

## 2024-06-27 NOTE — Progress Notes (Signed)
 Speech Language Pathology Treatment: Dysphagia  Patient Details Name: Kyle Kelley MRN: [id number] DOB: 30-Jul-1945 Today's Date: 06/27/2024 Time: 1215-1230 SLP Time Calculation (min) (ACUTE ONLY): 15 min  Assessment / Plan / Recommendation Clinical Impression  PLAN: Continue regular diet/thin liquids, meds as tolerated. DC ST. Please reconsult if needs arise.  Pt seen during lunch to assess tolerance of regular solids and thin liquids. Pt reports no difficulty swallowing, but does indicate difficulty chewing dry solids. No overt s/s aspiration observed given trials of thin liquid, fish, mashed potatoes, and green beans. Will request additional gravy/sauce be added to trays to provide extra moisture to solid items. No further ST intervention recommended at this time. Please reconsult if needs arise.    HPI HPI: Kyle Kelley is a 80 yo male admitted from [facility] with acute hypoxemic respiratory failure. He was intubated on arrival on 1/18 and extubated on 1/21. Pt received on HFNC 35L. CXR (06/22/24) Extensive asymmetric bilateral nodular interstitial and airspace  infiltrates, more severe within the left lung, in keeping with multifocal  infection, with slight improvement within the left upper lung zone. Question of aspiration component. SLP consulted for clinical swallow assessment. No hx of dysphagia per pt report or chart review. PmHx significant for  frequent hospitalizations since October due to PAD, GI bleed with perforated duodenal ulcer, wound dehiscence with wound vac placement and need for rehab/snf placement at d/c  .      SLP Plan  Discharge SLP treatment due to goals met        Swallow Evaluation Recommendations    Continue reg/thin, add gravy/sauce for moisture.     Recommendations  Diet recommendations: Regular;Thin liquid Liquids provided via: Cup;Straw Medication Administration: Other (Comment) (as tolerated) Supervision: Patient able to self  feed Compensations: Slow rate;Small sips/bites;Minimize environmental distractions Postural Changes and/or Swallow Maneuvers: Seated upright 90 degrees        Oral care BID   Intermittent Supervision/Assistance Dysphagia, unspecified (R13.10)     Discharge SLP treatment due to (comment)    Kyle Kelley B. Dory, MSP, CCC-SLP Speech Language Pathologist Office: [phone]  Dory Caprice Daring 06/27/2024, 1:13 PM

## 2024-06-28 DIAGNOSIS — J9601 Acute respiratory failure with hypoxia: Secondary | ICD-10-CM | POA: Diagnosis not present

## 2024-06-28 LAB — CBC
HCT: 25.5 % — ABNORMAL LOW (ref 39.0–52.0)
Hemoglobin: 7.7 g/dL — ABNORMAL LOW (ref 13.0–17.0)
MCH: 22.6 pg — ABNORMAL LOW (ref 26.0–34.0)
MCHC: 30.2 g/dL (ref 30.0–36.0)
MCV: 75 fL — ABNORMAL LOW (ref 80.0–100.0)
Platelets: 404 10*3/uL — ABNORMAL HIGH (ref 150–400)
RBC: 3.4 MIL/uL — ABNORMAL LOW (ref 4.22–5.81)
RDW: 20.1 % — ABNORMAL HIGH (ref 11.5–15.5)
WBC: 18 10*3/uL — ABNORMAL HIGH (ref 4.0–10.5)
nRBC: 0 % (ref 0.0–0.2)

## 2024-06-28 LAB — CHOLESTEROL, BODY FLUID: Cholesterol, Fluid: 17 mg/dL

## 2024-06-28 LAB — GLUCOSE, CAPILLARY
Glucose-Capillary: 94 mg/dL (ref 70–99)
Glucose-Capillary: 152 mg/dL — ABNORMAL HIGH (ref 70–99)
Glucose-Capillary: 100 mg/dL — ABNORMAL HIGH (ref 70–99)
Glucose-Capillary: 151 mg/dL — ABNORMAL HIGH (ref 70–99)
Glucose-Capillary: 171 mg/dL — ABNORMAL HIGH (ref 70–99)

## 2024-06-28 LAB — BASIC METABOLIC PANEL WITH GFR
Anion gap: 7 (ref 5–15)
BUN: 37 mg/dL — ABNORMAL HIGH (ref 8–23)
CO2: 32 mmol/L (ref 22–32)
Calcium: 8.1 mg/dL — ABNORMAL LOW (ref 8.9–10.3)
Chloride: 94 mmol/L — ABNORMAL LOW (ref 98–111)
Creatinine, Ser: 0.77 mg/dL (ref 0.61–1.24)
GFR, Estimated: >60 mL/min
Glucose, Bld: 98 mg/dL (ref 70–99)
Potassium: 4.1 mmol/L (ref 3.5–5.1)
Sodium: 134 mmol/L — ABNORMAL LOW (ref 135–145)

## 2024-06-28 LAB — MAGNESIUM: Magnesium: 1.9 mg/dL (ref 1.7–2.4)

## 2024-06-28 LAB — DIGOXIN LEVEL: Digoxin Level: <0.6 ng/mL — ABNORMAL LOW (ref 0.8–2.0)

## 2024-06-28 MED ORDER — SODIUM CHLORIDE 0.9 % IV SOLN
500.0000 mg | INTRAVENOUS | Status: AC
  Administered 2024-06-28 – 2024-06-29 (×2): 500 mg via INTRAVENOUS
  Filled 2024-06-28 (×2): qty 25

## 2024-06-28 MED ORDER — MAGNESIUM SULFATE 2 GM/50ML IV SOLN
2.0000 g | Freq: Once | INTRAVENOUS | Status: AC
  Administered 2024-06-28: 2 g via INTRAVENOUS
  Filled 2024-06-28: qty 50

## 2024-06-28 MED ORDER — IRON SUCROSE 500 MG IVPB - SIMPLE MED
500.0000 mg | Freq: Every day | INTRAVENOUS | Status: DC
  Filled 2024-06-28: qty 275

## 2024-06-28 NOTE — Plan of Care (Signed)
  Problem: Education: Goal: Ability to describe self-care measures that may prevent or decrease complications (Diabetes Survival Skills Education) will improve Outcome: Progressing Goal: Individualized Educational Video(s) Outcome: Progressing   Problem: Coping: Goal: Ability to adjust to condition or change in health will improve Outcome: Progressing   Problem: Fluid Volume: Goal: Ability to maintain a balanced intake and output will improve Outcome: Progressing   Problem: Health Behavior/Discharge Planning: Goal: Ability to identify and utilize available resources and services will improve Outcome: Progressing Goal: Ability to manage health-related needs will improve Outcome: Progressing   Problem: Metabolic: Goal: Ability to maintain appropriate glucose levels will improve Outcome: Progressing   Problem: Nutritional: Goal: Maintenance of adequate nutrition will improve Outcome: Progressing Goal: Progress toward achieving an optimal weight will improve Outcome: Progressing   Problem: Skin Integrity: Goal: Risk for impaired skin integrity will decrease Outcome: Progressing   Problem: Tissue Perfusion: Goal: Adequacy of tissue perfusion will improve Outcome: Progressing   Problem: Activity: Goal: Ability to tolerate increased activity will improve Outcome: Progressing   Problem: Respiratory: Goal: Ability to maintain a clear airway and adequate ventilation will improve Outcome: Progressing   Problem: Role Relationship: Goal: Method of communication will improve Outcome: Progressing   Problem: Education: Goal: Knowledge of General Education information will improve Description: Including pain rating scale, medication(s)/side effects and non-pharmacologic comfort measures Outcome: Progressing   Problem: Health Behavior/Discharge Planning: Goal: Ability to manage health-related needs will improve Outcome: Progressing   Problem: Clinical Measurements: Goal:  Ability to maintain clinical measurements within normal limits will improve Outcome: Progressing Goal: Will remain free from infection Outcome: Progressing Goal: Diagnostic test results will improve Outcome: Progressing Goal: Respiratory complications will improve Outcome: Progressing Goal: Cardiovascular complication will be avoided Outcome: Progressing   Problem: Activity: Goal: Risk for activity intolerance will decrease Outcome: Progressing   Problem: Nutrition: Goal: Adequate nutrition will be maintained Outcome: Progressing   Problem: Coping: Goal: Level of anxiety will decrease Outcome: Progressing   Problem: Elimination: Goal: Will not experience complications related to bowel motility Outcome: Progressing Goal: Will not experience complications related to urinary retention Outcome: Progressing   Problem: Pain Managment: Goal: General experience of comfort will improve and/or be controlled Outcome: Progressing   Problem: Safety: Goal: Ability to remain free from injury will improve Outcome: Progressing   Problem: Skin Integrity: Goal: Risk for impaired skin integrity will decrease Outcome: Progressing   Problem: Safety: Goal: Non-violent Restraint(s) Outcome: Progressing

## 2024-06-28 NOTE — Progress Notes (Signed)
 Physical Therapy Treatment Patient Details Name: Kyle Kelley MRN: [id number] DOB: 11/04/1945 Today's Date: 06/28/2024   History of Present Illness 79 yo male presents from Rehab Facility 06/16/2024 for respiratory distress intubated on arrival.  Severe L-sided aspiration PNA and resultant septic shock.  PMHx: atrial fibrillation (on Eliquis /recently held), vasculopath, acute on chronic ischemic LLE nonhealing wound, HTN, HLD, aortic stenosis, CVA, prostate cancer s/p prostatectomy 2014, and PVD.   PT Comments  Pt received on [facility] with OT present and agreeable to co-treat to work towards acute therapy goals. Pt was able to stand for ~3 minutes with staff providing posterior pericare. MinA needed during static standing due to posterior lean with BLE's shaking. Able to then step-pivot towards the recliner with MaxAx2 needed to shift hips into recliner. After a few minutes of rest, pt was then able to perform x3 sit<>stand transfers with ModA and RW. Discussed benefits of CIR with pt and spouse leaning towards >3hrs post acute rehab. Will continue to follow acutely to maximize rehab potential.     If plan is discharge home, recommend the following: A lot of help with walking and/or transfers;A lot of help with bathing/dressing/bathroom;Assistance with cooking/housework;Direct supervision/assist for medications management;Direct supervision/assist for financial management;Assist for transportation;Help with stairs or ramp for entrance;Supervision due to cognitive status   Can travel by private vehicle     No  Equipment Recommendations  Hospital bed;Wheelchair (measurements PT);Wheelchair cushion (measurements PT);BSC/3in1       Precautions / Restrictions Precautions Precautions: Fall Recall of Precautions/Restrictions: Intact Restrictions Weight Bearing Restrictions Per Provider Order: No     Mobility  Bed Mobility    General bed mobility comments: NT received on BSC    Transfers Overall  transfer level: Needs assistance Equipment used: Rolling walker (2 wheels) Transfers: Sit to/from Stand, Bed to chair/wheelchair/BSC Sit to Stand: Mod assist   Step pivot transfers: Max assist, +2 physical assistance, +2 safety/equipment      General transfer comment: ModA to perform multiple stands with assist to boost-up and steady RW. Cues for proper hand placement. Able to step-pivot towards recliner with increased time/effort. Shuffling steps with pt fatiguing. Required assist to shift hips and center into the recliner    Ambulation/Gait Ambulation/Gait assistance: Max assist, +2 physical assistance, +2 safety/equipment Gait Distance (Feet): 4 Feet Assistive device: Rolling walker (2 wheels) Gait Pattern/deviations: Step-through pattern, Decreased stride length, Shuffle, Trunk flexed Gait velocity: decr    General Gait Details: Forward flexed posture with shuffling steps. Initially ModA +2 for safety, however, as pt fatigued, required MaxAx2     Balance Overall balance assessment: Needs assistance Sitting-balance support: Bilateral upper extremity supported, Feet supported Sitting balance-Leahy Scale: Fair Sitting balance - Comments: occasional L lateral lean to off-load R buttocks Postural control: Left lateral lean, Posterior lean Standing balance support: Bilateral upper extremity supported, During functional activity, Reliant on assistive device for balance Standing balance-Leahy Scale: Poor Standing balance comment: posterior lean in standing       Communication Communication Communication: Impaired Factors Affecting Communication: Hearing impaired  Cognition Arousal: Alert Behavior During Therapy: WFL for tasks assessed/performed   PT - Cognitive impairments: Safety/Judgement, Problem solving    Following commands: Intact      Cueing Cueing Techniques: Verbal cues, Gestural cues, Tactile cues  Exercises General Exercises - Lower Extremity Ankle Circles/Pumps:  AROM, Both, Supine (2 reps) Quad Sets: AROM, Both, Supine (2 reps) Heel Slides: AROM, Both, Supine (2 reps) Other Exercises Other Exercises: x3 sit<>stand with ModA and RW  Pertinent Vitals/Pain Pain Assessment Pain Assessment: No/denies pain     PT Goals (current goals can now be found in the care plan section) Acute Rehab PT Goals Patient Stated Goal: to go to rehab PT Goal Formulation: With patient/family Time For Goal Achievement: 07/04/24 Potential to Achieve Goals: Fair Progress towards PT goals: Progressing toward goals    Frequency    Min 3X/week       AM-PAC PT 6 Clicks Mobility   Outcome Measure  Help needed turning from your back to your side while in a flat bed without using bedrails?: A Lot Help needed moving from lying on your back to sitting on the side of a flat bed without using bedrails?: A Lot Help needed moving to and from a bed to a chair (including a wheelchair)?: Total Help needed standing up from a chair using your arms (e.g., wheelchair or bedside chair)?: A Lot Help needed to walk in hospital room?: Total Help needed climbing 3-5 steps with a railing? : Total 6 Click Score: 9    End of Session Equipment Utilized During Treatment: Gait belt Activity Tolerance: Patient tolerated treatment well Patient left: in chair;with call bell/phone within reach;with chair alarm set;with family/visitor present Nurse Communication: Mobility status PT Visit Diagnosis: Other abnormalities of gait and mobility (R26.89);Unsteadiness on feet (R26.81);Muscle weakness (generalized) (M62.81);Difficulty in walking, not elsewhere classified (R26.2);Adult, failure to thrive (R62.7);Pain Pain - part of body:  (sacrum)     Time: 9147-9079 PT Time Calculation (min) (ACUTE ONLY): 28 min  Charges:    $Therapeutic Exercise: 8-22 mins PT General Charges $$ ACUTE PT VISIT: 1 Visit                    Kate ORN, PT, DPT Secure Chat Preferred  Rehab Office  [phone]    Kate BRAVO Wendolyn 06/28/2024, 10:35 AM

## 2024-06-28 NOTE — Progress Notes (Signed)
 [facility] PROGRESS NOTE    Progress Note  Kyle Kelley  FMW:[id number] DOB: 1946/03/14 DOA: 06/16/2024 PCP: Charlott Dorn LABOR, MD     Brief Narrative:   Kyle Kelley is an 79 y.o. male past medical history of chronic atrial fibrillation on anticoagulation, aortic stenosis, history of perforated duodenal ulcer with a Midwife, with complications of wound dehiscence and wound VAC placement who was sent to skilled nursing facility, was brought in by EMS for respiratory distress EMS found him unresponsive wheezing, also in A-fib with RVR on arrival was emergently intubated for airway protection unresponsive to fluid and started on pressors mated to the ICU  Significant Events: Admitted to ICU 06/16/24 1/19 Intubated, sedated, Afib RVR, on pressors  1/20 Pressor requirements improving, Afib 120s-130s, now following commands, still no BM- SMOG enema given. Stress dose steroids and flagyl  stopped 1/21 remains in A-fib but rate controlled.  Failed SBT.  Culture data still negative.  Added dobutamine  1/22 awake with stable respiratory status post extubation 1/23 remains on dobutamine  and HFNC, off pressors. Thora on left- 1200cc removed, not fully evacuated.  1/26 Remains on dobutamine  gtt at , Coox 67.3, continues to diurese with lasix     Assessment/Plan:   Acute hypoxemic respiratory failure (HCC) secondary to healthcare associated pneumonia with a component of pulmonary edema from heart failure: Initially required fluid resuscitation, pressors and IV fluids. He completed 7-day course of antibiotics. He was weaned off pressors. Started on IV diuresis, eventually extubated. Now satting greater 92% on room air. Family agreed for the patient to be DNR/DNI.  He will have a lengthy and rocky recovery. Palliative care was consulted due to his severe fragility.  Mixed picture initially septic shock now cardiogenic component: Advanced heart failure team was  consulted started on dobutamine , Aldactone  and  Lasix  drip. Started on midodrine  10 mg 3 times a day for pressure support.    Paroxysmal atrial fibrillation: Initially started on amiodarone  drip and heparin . Transition to Eliquis  and digoxin . He remains rate controlled.  Leukocytosis/1 out of 2 blood cultures positive for PEPTOSTREPTOCOCCUS  ASACCHAROLYTICUS Abnormal : Currently on IV Rocephin .  Leukocytosis continues to rise he has remained afebrile. Discussed with ID, likely a contaminant. Leukocytosis resolving, continues to remain afebrile. Surveillance blood cultures have been negative till date. No further workup.  Acute kidney injury: Likely due to sepsis. With a baseline creatinine of less than 1, peaked at 1.4 now improved to baseline.  Bilateral pleural effusion: Status post thoracocentesis on 06/21/2024.  Then again on 06/26/2023.  Acute on chronic microcytic anemia/with a history of GI bleed secondary to a perforated duodenal ulcer: Hemo-globin is stable at around 7.9. MCV is low RDW is high check an anemia panel.  History of severe PAD with a recent lower extremity DVT status post thrombectomy on the left/acute ischemic leg in October 2025 status post left anterior peritoneal endarterectomy with vein patch and stenting: Lower extremity Doppler was negative. Remains on Eliquis  and Plavix . Continue wound care.  Essential hypertension/hyperlipidemia: Antihypertensive medications are on hold he is currently on midodrine  due to borderline blood pressure. Continue statins.  Remote history of ICH: Continue to monitor neurological status while on anticoagulation.  Hyperglycemia: Relatively well-controlled on Jardiance and sliding scale insulin .  Hypokalemia/hypomagnesemia: Try to keep potassium greater than 4 magnesium  greater than 2. Potassium is 3.5 replete orally recheck in the morning.  Severe protein caloric malnutrition: Continue regular diet.  Acute  confusional state: Now improved.  Severe deconditioning/severe protein caloric  malnutrition: PT OT has been consulted, will need skilled nursing facility placement. Out of bed to chair.  Unstageable sacral decubitus ulcer present on admission: RN Pressure Injury Documentation: Wound 06/16/24 1900 Pressure Injury Sacrum Mid Unstageable - Full thickness tissue loss in which the base of the injury is covered by slough (yellow, tan, gray, green or brown) and/or eschar (tan, brown or black) in the wound bed. (Active)     Wound 06/26/24 1041 Pressure Injury Thigh Posterior;Proximal;Right Deep Tissue Pressure Injury - Purple or maroon localized area of discolored intact skin or blood-filled blister due to damage of underlying soft tissue from pressure and/or shear. (Active)     DVT prophylaxis: Eliquis  Family Communication:wife Status is: Inpatient Remains inpatient appropriate because: Septic shock    Code Status:     Code Status Orders  (From admission, onward)           Start     Ordered   06/17/24 1212  Do not attempt resuscitation (DNR)- Limited -Do Not Intubate (DNI)  (Code Status)  Continuous       Question Answer Comment  If pulseless and not breathing No CPR or chest compressions.   In Pre-Arrest Conditions (Patient Is Breathing and Has A Pulse) Do not intubate. Provide all appropriate non-invasive medical interventions. Avoid ICU transfer unless indicated or required.   Consent: Discussion documented in EHR or advanced directives reviewed      06/17/24 1212           Code Status History     Date Active Date Inactive Code Status Order ID Comments User Context   06/16/2024 2022 06/17/2024 1212 Full Code [id number]  Layman Raisin, DO ED   05/03/2024 1251 05/20/2024 1722 Full Code [id number]  Willette Adriana LABOR, MD Inpatient   03/26/2024 0835 03/28/2024 1733 Full Code [id number]  [place] Lucie PARAS, PA-C Inpatient   03/26/2024 0558 03/26/2024 0835 Full Code [id number]   Franky Redia SAILOR, MD Inpatient   02/28/2024 0235 02/29/2024 2105 Full Code [id number]  Sallyann Normie HERO, MD ED   09/02/2020 2031 09/03/2020 2034 Full Code [id number]  Hilma Rankins, MD Inpatient      Advance Directive Documentation    Flowsheet Row Most Recent Value  Type of Advance Directive Healthcare Power of Attorney  Pre-existing out of facility DNR order (yellow form or pink MOST form) --  MOST Form in Place? --      IV Access:   Peripheral IV   Procedures and diagnostic studies:   No results found.    Medical Consultants:   None.   Subjective:    HIEU HERMS has no new complaints feels great this morning tolerating his diet.  Objective:    Vitals:   06/27/24 1624 06/27/24 1950 06/28/24 0035 06/28/24 0458  BP: 117/68 100/62 111/65 116/66  Pulse: 88     Resp: 20  (!) 22   Temp: (!) 97.5 F (36.4 C) 98 F (36.7 C)  97.7 F (36.5 C)  TempSrc: Oral Oral Oral Oral  SpO2: 90%   90%  Weight:    70.3 kg  Height:       SpO2: 90 % O2 Flow Rate (L/min): 1 L/min FiO2 (%): 45 %   Intake/Output Summary (Last 24 hours) at 06/28/2024 0804 Last data filed at 06/28/2024 0400 Gross per 24 hour  Intake 237 ml  Output 850 ml  Net -613 ml   Filed Weights   06/27/24 0500 06/27/24 0527 06/28/24 0458  Weight: 68.3 kg 65.6 kg 70.3  kg    Exam: General exam: In no acute distress. Respiratory system: Good air movement and clear to auscultation. Cardiovascular system: S1 & S2 heard, RRR. No JVD. Gastrointestinal system: Abdomen is nondistended, soft and nontender.  Extremities: No pedal edema. Skin: No rashes, lesions or ulcers Psychiatry: Judgement and insight appear normal. Mood & affect appropriate.  Data Reviewed:    Labs: Basic Metabolic Panel: Recent Labs  Lab 06/24/24 0400 06/24/24 1658 06/25/24 0502 06/26/24 0507 06/27/24 0411 06/28/24 0400  NA 133* 132* 133* 133* 131* 134*  K 3.0* 4.0 3.4* 3.5 3.5 4.1  CL 87* 85* 87* 91* 91* 94*  CO2 41* 41* 42*  37* 35* 32  GLUCOSE 106* 186* 100* 91 102* 98  BUN 29* 29* 27* 31* 34* 37*  CREATININE 0.60* 0.78 0.71 0.77 0.79 0.77  CALCIUM  7.5* 7.9* 8.0* 8.1* 8.2* 8.1*  MG 1.6*  --  2.2 2.0 2.0 1.9   GFR Estimated Creatinine Clearance: 78.1 mL/min (by C-G formula based on SCr of 0.77 mg/dL). Liver Function Tests: Recent Labs  Lab 06/21/24 2323  PROT 6.1*  ALBUMIN  2.3*   No results for input(s): LIPASE, AMYLASE in the last 168 hours. No results for input(s): AMMONIA in the last 168 hours. Coagulation profile No results for input(s): INR, PROTIME in the last 168 hours. COVID-19 Labs  Recent Labs    06/27/24 0841  FERRITIN 53    Lab Results  Component Value Date   SARSCOV2NAA NEGATIVE 06/16/2024   SARSCOV2NAA NEGATIVE 09/02/2020    CBC: Recent Labs  Lab 06/24/24 0400 06/25/24 0502 06/26/24 0507 06/27/24 0411 06/28/24 0400  WBC 12.4* 17.7* 20.4* 22.0* 18.0*  HGB 7.8* 7.8* 7.8* 7.9* 7.7*  HCT 26.8* 26.0* 26.0* 26.0* 25.5*  MCV 78.4* 77.2* 76.2* 75.6* 75.0*  PLT 296 312 358 390 404*   Cardiac Enzymes: No results for input(s): CKTOTAL, CKMB, CKMBINDEX, TROPONINI in the last 168 hours. BNP (last 3 results) Recent Labs    06/20/24 0346 06/21/24 0336 06/22/24 0502  PROBNP 6,984.0* 4,584.0* 3,305.0*   CBG: Recent Labs  Lab 06/27/24 0747 06/27/24 1156 06/27/24 1621 06/27/24 2041 06/28/24 0750  GLUCAP 215* 165* 85 121* 94   D-Dimer: No results for input(s): DDIMER in the last 72 hours. Hgb A1c: No results for input(s): HGBA1C in the last 72 hours. Lipid Profile: No results for input(s): CHOL, HDL, LDLCALC, TRIG, CHOLHDL, LDLDIRECT in the last 72 hours. Thyroid  function studies: No results for input(s): TSH, T4TOTAL, T3FREE, THYROIDAB in the last 72 hours.  Invalid input(s): FREET3 Anemia work up: Recent Labs    06/27/24 0841  VITAMINB12 1,674*  FOLATE 15.8  FERRITIN 53  TIBC 293  IRON  29*  RETICCTPCT 1.7    Sepsis Labs: Recent Labs  Lab 06/25/24 0502 06/26/24 0507 06/27/24 0411 06/28/24 0400  WBC 17.7* 20.4* 22.0* 18.0*   Microbiology Recent Results (from the past 240 hours)  Body fluid culture w Gram Stain     Status: None   Collection Time: 06/21/24  9:14 PM   Specimen: Pleura; Body Fluid  Result Value Ref Range Status   Specimen Description PLEURAL  Final   Special Requests NONE  Final   Gram Stain   Final    RARE WBC PRESENT, PREDOMINANTLY PMN NO ORGANISMS SEEN    Culture   Final    NO GROWTH 3 DAYS Performed at [facility], [address], [place], [place] [address]    Report Status 06/24/2024 FINAL  Final  Culture, blood (  Routine X 2) w Reflex to ID Panel     Status: None (Preliminary result)   Collection Time: 06/24/24 11:38 AM   Specimen: BLOOD  Result Value Ref Range Status   Specimen Description BLOOD SITE NOT SPECIFIED  Final   Special Requests   Final    BOTTLES DRAWN AEROBIC AND ANAEROBIC Blood Culture adequate volume   Culture   Final    NO GROWTH 4 DAYS Performed at [facility], [address], [place], [place] [address]    Report Status PENDING  Incomplete     Medications:    apixaban   5 mg Oral BID   ascorbic acid   500 mg Oral Daily   clopidogrel   75 mg Oral Daily   collagenase    Topical Daily   digoxin   0.0625 mg Oral Daily   famotidine   20 mg Oral QHS   insulin  aspart  0-9 Units Subcutaneous TID AC & HS   lactose free nutrition  237 mL Oral TID BM   midodrine   10 mg Oral TID WC   multivitamin with minerals  1 tablet Oral Daily   polyethylene glycol  17 g Oral BID   senna-docusate  2 tablet Oral QHS   sodium chloride  flush  10-40 mL Intracatheter Q12H   sodium hypochlorite   Irrigation Daily   spironolactone   25 mg Oral Daily   torsemide   40 mg Oral Daily   zinc  sulfate (50mg  elemental zinc )  220 mg Oral Daily   Continuous Infusions:  cefTRIAXone  (ROCEPHIN )  IV 2 g (06/27/24 1337)      LOS: 12 days   Erle Odell Castor  [facility]  06/28/2024, 8:05 AM

## 2024-06-28 NOTE — Plan of Care (Signed)
" °  Problem: Coping: Goal: Ability to adjust to condition or change in health will improve Outcome: Progressing   Problem: Fluid Volume: Goal: Ability to maintain a balanced intake and output will improve Outcome: Progressing   Problem: Health Behavior/Discharge Planning: Goal: Ability to manage health-related needs will improve Outcome: Progressing   Problem: Metabolic: Goal: Ability to maintain appropriate glucose levels will improve Outcome: Progressing   Problem: Nutritional: Goal: Maintenance of adequate nutrition will improve Outcome: Progressing   Problem: Skin Integrity: Goal: Risk for impaired skin integrity will decrease Outcome: Progressing   Problem: Tissue Perfusion: Goal: Adequacy of tissue perfusion will improve Outcome: Progressing   Problem: Activity: Goal: Ability to tolerate increased activity will improve Outcome: Progressing   Problem: Respiratory: Goal: Ability to maintain a clear airway and adequate ventilation will improve Outcome: Progressing   Problem: Role Relationship: Goal: Method of communication will improve Outcome: Progressing   Problem: Education: Goal: Knowledge of General Education information will improve Description: Including pain rating scale, medication(s)/side effects and non-pharmacologic comfort measures Outcome: Progressing   Problem: Health Behavior/Discharge Planning: Goal: Ability to manage health-related needs will improve Outcome: Progressing   "

## 2024-06-28 NOTE — Progress Notes (Signed)
 "    Advanced Heart Failure Rounding Note  Cardiologist: Wilbert Bihari, MD  Chief Complaint: cardiogenic shock Subjective:   1/25 DBA cut back to 3 mcg. Diuresed with IV lasix .  1/26 Diuresed with IV lasix . Amio cut back to 30 mg per hour. Placed back on rocephin .  1/27 DBA weaned to 1.5 mcg. Diuresed with IV lasix  + diamox .  Stable off DBA and amio.   Wife at bedside. Sitting up in chair. Discussing with team possibility of CIR.    Objective:   Weight Range: 70.3 kg Body mass index is 20.45 kg/m.   Vital Signs:   Temp:  [97.5 F (36.4 C)-98 F (36.7 C)] 97.7 F (36.5 C) (01/30 0458) Pulse Rate:  [88-104] 104 (01/30 0750) Resp:  [20-28] 24 (01/30 0750) BP: (100-119)/(54-68) 119/58 (01/30 0750) SpO2:  [90 %-95 %] 91 % (01/30 0750) Weight:  [70.3 kg] 70.3 kg (01/30 0458) Last BM Date : 06/27/24  Weight change: Filed Weights   06/27/24 0500 06/27/24 0527 06/28/24 0458  Weight: 68.3 kg 65.6 kg 70.3 kg    Intake/Output:   Intake/Output Summary (Last 24 hours) at 06/28/2024 0926 Last data filed at 06/28/2024 0400 Gross per 24 hour  Intake 237 ml  Output 850 ml  Net -613 ml   Physical Exam  General:  chronically ill/ elderly / frail appearing.  No respiratory difficulty Neck: No JVD Cor: Irregular rate & rhythm. No murmurs. Lungs: clear Extremities: L leg wrapped, + 1 RLE edema Neuro: alert & oriented x 3. Affect pleasant.   Telemetry   A fib low 100s (Personally reviewed)   Medications:     Scheduled Medications:  apixaban   5 mg Oral BID   ascorbic acid   500 mg Oral Daily   clopidogrel   75 mg Oral Daily   collagenase    Topical Daily   digoxin   0.0625 mg Oral Daily   famotidine   20 mg Oral QHS   insulin  aspart  0-9 Units Subcutaneous TID AC & HS   lactose free nutrition  237 mL Oral TID BM   midodrine   10 mg Oral TID WC   multivitamin with minerals  1 tablet Oral Daily   polyethylene glycol  17 g Oral BID   senna-docusate  2 tablet Oral QHS   sodium  chloride flush  10-40 mL Intracatheter Q12H   sodium hypochlorite   Irrigation Daily   spironolactone   25 mg Oral Daily   torsemide   40 mg Oral Daily   zinc  sulfate (50mg  elemental zinc )  220 mg Oral Daily    Infusions:  magnesium  sulfate bolus IVPB      PRN Medications: ipratropium-albuterol , ondansetron  (ZOFRAN ) IV, sodium chloride  flush    Patient Profile   Kyle Kelley is a 79 y.o. male with a history of prostate cancer, CVA, multiple hospitalizations secondary to PAD, GI bleed perforated duodenal ulcer, wound dehiscence requiring skilled placement. Here with mixed shock.  Assessment/Plan   Mixed shock - cardiogenic/septic - Likely aspiration PNA and low coox with IV DBA at 5 - Echo 06/17/24 EF 30-35% - Echo 06/21/24 EF 40-45% - Suspect he has severe underlying CAD but no angina and not CABG candidate so medical therapy likely most appropriate at this time. Can consider ischemic eval down the road if significant recovery, but doubt it will change management - Palliative care following.  - Stable off DBA - Now on PO Torsemide  40 mg daily.  - Continue midodrine  10 mg three times a day.  - Continue  digoxin  0.0625 mg daily. Dig level <0.06.  - Continue spiro 25 mg daily.   - Keep off SGLT2i with pressure injuries.  - Supp K.  - Blood cx with peptostreptococcus. Has now completed abx. WBC down trending.  - TED hose.  Afib RVR on admit - Permanent A fib. Rate controlled.  - Now off amio as he's off DBA.  - Continue eliquis  5 mg twice a day.     Acute Hypoxic Respirator Failure HCAP versus Aspiration.  -Intubated on arrival.  -Extubated 06/19/24 -Now off oxygen with stable sats.   -Improving with diuresis/thoracentesis.    Anemia   -HX of GI Bleed 05/04/24 EGD showing duodena ulcer - Hx of perforated duodenal ulcer-taken to OR on 12/9 s/p ex-lap, graham patch repair.  - hgb 7.8>7.8>7.8>7.9>7.7 - Primary managing   PAD , Ischemic Leg in 02/2024- s/p left tibioperoneal  trunk endarterectomy with vein patch angioplasty, and stenting of left SFA-  -LE DVT S/P Thrombectomy  -LLE full thickness wound. Continue wound care.    Aortic Stenosis  -Moderate previous ECHO. Calcification.  -Based on echo is not severe   Pleural Effusion,Bilateral  - s/p left thoracentesis on 1/23 with 1200 cc out -1/27 S/P R/L Thoracentesis    DNR/DNI - treat the treatable. No intubation or CPR - Palliative care consultation given mutiple comorbid medical conditions, severe frailty and malnutrition  Now planning for CIR. AHF team will sign off at this time. Continue current medication regimen. Does not need f/u with AHF clinic. Will need f/u with [facility] at discharge.   Length of Stay: 12  Kyle LITTIE Coe, NP  06/28/2024, 9:26 AM  Advanced Heart Failure Team Pager [phone] (M-F; 7a - 5p)   Please visit [email or web address]: For overnight coverage please call cardiology fellow first. If fellow not available call Shock/ECMO MD on call.  For ECMO / Mechanical Support (Impella, IABP, LVAD) issues call Shock / ECMO MD on call.   "

## 2024-06-28 NOTE — Progress Notes (Signed)
 Occupational Therapy Treatment Patient Details Name: Kyle Kelley MRN: [id number] DOB: 1945/12/01 Today's Date: 06/28/2024   History of present illness 79 yo male presents from Rehab Facility 06/16/2024 for respiratory distress intubated on arrival.  Severe L-sided aspiration PNA and resultant septic shock.  PMHx: atrial fibrillation (on Eliquis /recently held), vasculopath, acute on chronic ischemic LLE nonhealing wound, HTN, HLD, aortic stenosis, CVA, prostate cancer s/p prostatectomy 2014, and PVD.   OT comments  Patient received in supine and eager for OT treatment and OOB. Patient instructed on bed mobility and able to get to EOB with mod assist. Patient performed transfer to [facility] with RW and mod assist with cues for hand placement. Toilet hygiene performed with max assist while standing with PT assisting with standing balance. Patient performed sit to stands from recliner with mod assist x3.  Patient will benefit from intensive inpatient follow-up therapy, >3 hours/day.  Acute OT to continue to follow to address established goals to facilitate DC to next venue of care.        If plan is discharge home, recommend the following:  A lot of help with walking and/or transfers;A lot of help with bathing/dressing/bathroom;Assistance with cooking/housework;Assist for transportation   Equipment Recommendations  Other (comment) (defer)    Recommendations for Other Services      Precautions / Restrictions Precautions Precautions: Fall Recall of Precautions/Restrictions: Intact Restrictions Weight Bearing Restrictions Per Provider Order: No       Mobility Bed Mobility Overal bed mobility: Needs Assistance Bed Mobility: Supine to Sit     Supine to sit: Mod assist, HOB elevated, Used rails     General bed mobility comments: increased time and assistance to raise trunk    Transfers Overall transfer level: Needs assistance Equipment used: Rolling walker (2 wheels) Transfers: Sit  to/from Stand, Bed to chair/wheelchair/BSC Sit to Stand: Mod assist     Step pivot transfers: Mod assist, Max assist, +2 physical assistance     General transfer comment: mod assist of one for transfer to [facility] but increased assistance to return to     Balance Overall balance assessment: Needs assistance Sitting-balance support: Bilateral upper extremity supported, Feet supported Sitting balance-Leahy Scale: Fair Sitting balance - Comments: occasional L lateral lean to off-load R buttocks Postural control: Left lateral lean, Posterior lean Standing balance support: Bilateral upper extremity supported, During functional activity, Reliant on assistive device for balance Standing balance-Leahy Scale: Poor Standing balance comment: posterior lean in standing                           ADL either performed or assessed with clinical judgement   ADL Overall ADL's : Needs assistance/impaired Eating/Feeding: Set up;Sitting   Grooming: Wash/dry hands;Wash/dry face;Set up;Sitting                   Toilet Transfer: Moderate assistance;BSC/3in1;Rolling walker (2 wheels) Toilet Transfer Details (indicate cue type and reason): from EOB to [facility] Toileting- Clothing Manipulation and Hygiene: Maximal assistance;Sit to/from stand Toileting - Clothing Manipulation Details (indicate cue type and reason): stood to RW and max assist of one to perform toilet hygiene and another assist with standing for safety            Extremity/Trunk Assessment              Vision       Perception     Praxis     Communication Communication Communication: Impaired Factors Affecting Communication: Hearing impaired  Cognition Arousal: Alert Behavior During Therapy: WFL for tasks assessed/performed Cognition: No apparent impairments                               Following commands: Intact        Cueing   Cueing Techniques: Verbal cues, Gestural cues, Tactile cues   Exercises      Shoulder Instructions       General Comments BP seated on EOB 111/63 (76), in standing 116/58 (74)    Pertinent Vitals/ Pain       Pain Assessment Pain Assessment: No/denies pain  Home Living                                          Prior Functioning/Environment              Frequency  Min 1X/week        Progress Toward Goals  OT Goals(current goals can now be found in the care plan section)  Progress towards OT goals: Progressing toward goals  Acute Rehab OT Goals Patient Stated Goal: to go to rehab OT Goal Formulation: With patient Time For Goal Achievement: 07/05/24 Potential to Achieve Goals: Good  Plan      Co-evaluation                 AM-PAC OT 6 Clicks Daily Activity     Outcome Measure   Help from another person eating meals?: None Help from another person taking care of personal grooming?: A Little Help from another person toileting, which includes using toliet, bedpan, or urinal?: A Lot Help from another person bathing (including washing, rinsing, drying)?: A Lot Help from another person to put on and taking off regular upper body clothing?: A Little Help from another person to put on and taking off regular lower body clothing?: A Lot 6 Click Score: 16    End of Session Equipment Utilized During Treatment: Gait belt;Rolling walker (2 wheels)  OT Visit Diagnosis: Unsteadiness on feet (R26.81);Other abnormalities of gait and mobility (R26.89);Muscle weakness (generalized) (M62.81)   Activity Tolerance Patient tolerated treatment well   Patient Left in chair;with call bell/phone within reach;with chair alarm set;with family/visitor present   Nurse Communication Mobility status        Time: 9160-9080 OT Time Calculation (min): 40 min  Charges: OT General Charges $OT Visit: 1 Visit OT Treatments $Self Care/Home Management : 23-37 mins  [name], OTA Acute Rehabilitation Services  Office  [phone]   [name] 06/28/2024, 1:07 PM

## 2024-06-28 NOTE — Progress Notes (Signed)
 Physical Therapy Wound Treatment Patient Details  Name: Kyle Kelley MRN: [id number] Date of Birth: 06/26/1945  Today's Date: 06/28/2024 Time: 1100-1200 Time Calculation (min): 60 min  Subjective  Subjective Assessment Subjective: Pt pleasant and agreeable to physical therapy session. Patient and Family Stated Goals: heal wound Date of Onset:  (Unsure) Prior Treatments: dressing changes  Pain Score:  Pt did not complain of pain throughout session  Wound Assessment  Wound 06/16/24 1900 Pressure Injury Sacrum Mid Unstageable - Full thickness tissue loss in which the base of the injury is covered by slough (yellow, tan, gray, green or brown) and/or eschar (tan, brown or black) in the wound bed. (Active)  Wound Image   06/28/24 1407  Site / Wound Assessment Pink;Black;Yellow 06/28/24 1407  Peri-wound Assessment Pink;Purple;Maceration;Induration;Erythema (blanchable) 06/28/24 1407  Wound Length (cm) 5.6 cm 06/27/24 1221  Wound Width (cm) 5.2 cm 06/27/24 1221  Wound Surface Area (cm^2) 22.87 cm^2 06/27/24 1221  Wound Depth (cm) 3.2 cm 06/27/24 1221  Wound Volume (cm^3) 48.791 cm^3 06/27/24 1221  Drainage Description Purulent;Odor - foul 06/28/24 1407  Drainage Amount Large 06/28/24 1407  Treatment Debridement (Selective);Hydrotherapy (Pulse lavage);Other (Comment) 06/28/24 1407  Dressing Type Foam - Lift dressing to assess site every shift;Dakin's-soaked gauze;Barrier Film (skin prep);Moist to moist 06/28/24 1407  Dressing Changed Changed 06/28/24 1407  Dressing Status Clean, Dry, Intact 06/28/24 1407  State of Healing Eschar 06/28/24 1407  % Wound base Red or Granulating 10% 06/28/24 1407  % Wound base Yellow/Fibrinous Exudate 5% 06/28/24 1407  % Wound base Black/Eschar 80% 06/28/24 1407  % Wound base Other/Granulation Tissue (Comment) 5% 06/28/24 1407  Undermining (cm) Measured from the small opening at the inferior aspect of the wound: 2.5 cm at 6:00, and up to 4.0 cm from  6:30-5:30 06/27/24 1221  Margins Unattached edges (unapproximated) 06/28/24 1407  Non-staged Wound Description Full thickness 06/28/24 1407      Hydrotherapy Pulsed lavage therapy - wound location: Sacrum Pulsed Lavage with Suction (psi): 12 psi Pulsed Lavage with Suction - Normal Saline Used: 1000 mL Pulsed Lavage Tip: Tip with splash shield Selective Debridement (non-excisional) Selective Debridement (non-excisional) - Location: Sacrum Selective Debridement (non-excisional) - Tools Used: Forceps, Scalpel, Scissors Selective Debridement (non-excisional) - Tissue Removed: eschar and other non-viable tissue    Wound Assessment and Plan  Wound Therapy - Assess/Plan/Recommendations Wound Therapy - Clinical Statement: Pulsed lavage utilized this session to wash out wound bed due to large amounts of drainage and thin necrotic tissue around wound bed. The dark color of drainage and necrotic tissue makes visualizing the area difficult. Able to get a clearer picture of wound after pulsed lavage (see above). Bone continues to be palpable and feels fragile and brittle to the touch. Our efforts may be limited given the condition of the sacrum. MD notified after session. This patient will benefit from continued wound therapy for selective removal of unviable tissue, to decrease bioburden, and promote wound bed healing. Wound Therapy - Functional Problem List: Decreased tolerance for positional changes due to sacral wound. Factors Delaying/Impairing Wound Healing: Immobility, Multiple medical problems, Polypharmacy Hydrotherapy Plan: Debridement, Dressing change, Patient/family education, Pulsatile lavage with suction Wound Therapy - Frequency: 2X / week Wound Therapy - Follow Up Recommendations: dressing changes by RN  Wound Therapy Goals- Improve the function of patient's integumentary system by progressing the wound(s) through the phases of wound healing (inflammation - proliferation - remodeling)  by: Wound Therapy Goals - Improve the function of patient's integumentary system by progressing the wound(s)  through the phases of wound healing by: Decrease Necrotic Tissue to: 20% Decrease Necrotic Tissue - Progress: Progressing toward goal Increase Granulation Tissue to: 80% Increase Granulation Tissue - Progress: Progressing toward goal Goals/treatment plan/discharge plan were made with and agreed upon by patient/family: Yes Time For Goal Achievement: 7 days Wound Therapy - Potential for Goals: Fair  Goals will be updated until maximal potential achieved or discharge criteria met.  Discharge criteria: when goals achieved, discharge from hospital, MD decision/surgical intervention, no progress towards goals, refusal/missing three consecutive treatments without notification or medical reason.  GP     Charges PT Wound Care Charges $Wound Debridement up to 20 cm: < or equal to 20 cm $PT Hydrotherapy Dressing: 3 dressings $PT Hydrotherapy Visit: 1 Visit       Leita JONETTA Sable 06/28/2024, 2:25 PM  Leita Sable, PT, DPT Acute Rehabilitation Services Secure Chat Preferred Office: [phone]

## 2024-06-28 NOTE — Consult Note (Signed)
" ° ° °  CLINICAL SUPPORT TEAM - WOUND OSTOMY AND CONTINENCE TEAM  CONSULTATION SERVICES   WOC Nurse-Inpatient Note  Consult requested for sacrum.  This was already performed on 1/28; please refer to progress notes for assessment and topical treatment orders have been provided for bedside nurses to perform.   Please re-consult if further assistance is needed.  Thank-you,  Stephane Fought MSN, RN, CWOCN, CWCN-AP, CNS Contact Mon-Fri 0700-1500: [phone]  "

## 2024-06-28 NOTE — Progress Notes (Signed)
 " Daily Progress Note   Date: 06/28/2024   Patient Name: Kyle Kelley  DOB: 1945/08/04  MRN: [id number]  Age / Sex: 79 y.o., male  Attending Physician: Odell Castor, Erle, MD Primary Care Physician: Charlott Dorn LABOR, MD Admit Date: 06/16/2024 Length of Stay: 12 days  Reason for Follow-up: Establishing goals of care  Past Medical History:  Diagnosis Date   Aortic stenosis    Moderate AS with mean AVG 10.8, V-max 2.68m/s, SVI 28, DI 0.3   Carotid artery stenosis    1-39% bilaterally by dopplers 11/2022   Dyslipidemia    Hypertension    Inguinal hernia 2010   Small asymptomatic L inguinal Hernia   Peripheral vascular disease    Permanent atrial fibrillation (HCC)    atrial fib    Pneumonia    hx of    Prostate cancer (HCC) 07/2012   s/p prostatectomy   PSA elevation    workup per Dr. Alm Fragmin, March 2014   Sigmoid diverticulosis    on colonoscopy in 2010   Skin cancer 02/2012   Dr Renay Homer   Stroke [facility])    Tobacco dependence    rarely smokes-3 cigs per week    Subjective:   Subjective: Chart Reviewed. Updates received. Patient Assessed. Created space and opportunity for patient  and family to explore thoughts and feelings regarding current medical situation.  Today's Discussion: Today before meeting with the patient/family, I reviewed the chart notes including hospitalist, cardiology, dietitian, PT, SLP, rehab admit coordinator notes all from yesterday; also reviewed nursing note, internal medicine note, cardiology notes from today. I also reviewed vital signs, nursing flowsheets, medication administrations record, labs, and imaging.   I came to see the patient in the morning, PT was working with him and he was on the bedside commode.  I excused myself to see another patient to return later.  I came back to the unit and the patient was sitting in the bedside chair, his wife is present.  Wife expressed her concern about the palliative medicine being  end-of-life.  I spent time talking about the role of palliative medicine and supporting patients as well as discerning goals of care.  I shared that we were consulted because patient was quite sick and had an uncertain path report.  However he is continuing to get better and at this point we are continuing to see for support and furthering goals of care conversations given his improvement.    We spent a good amount time talking about rehab and possible CIR admission.  We spent time talking about the different levels of rehab admission including CIR, SNF/rehab, home with home health PT/OT.  I shared that CIR admission has the benefit of typically getting better/stronger quicker, although it is intensive with 3 hours of therapy a day.  Send/rehab is less intensive but takes longer to get better, home health therapy takes the longest to get better and is the least intensive.  Patient expressed concerns about the intensity.  I shared that if the rehab admissions people did not feel that he could tolerate it they would not offer a bed.  I additionally shared that it would be up to insurance to approve the admission as well.  After spending a lot of time talking through different options and benefits/cons, the patient and his wife are open to [facility] admission if it is offered.  I shared that I would message the inpatient rehab admissions coordinator to let her know.  Overall the  patient is doing better today, feels quite motivated to get better.  His goal is to get stronger and get home for a more functional and independent life.  I again confirmed this ER admission would be in support of these goals.  I shared that his goals are clear, have a pretty good plan/path forward.  Sure the palliative medicine will back off for now.  We are available if there is a significant change.  Otherwise, I would come back to check on him if he is still in the hospital if he remains admitted next Wednesday when I return to  service.  During my visit the advanced heart failure team came to see the patient.  They shared that his heart failure is doing much better and I will sign off.  However they remain available if needed.  I provided emotional and general support through therapeutic listening, empathy, sharing of stories, and other techniques. I answered all questions and addressed all concerns to the best of my ability.  Review of Systems  Constitutional:  Positive for fatigue.  Respiratory:  Negative for shortness of breath.   Cardiovascular:  Negative for chest pain.  Gastrointestinal:  Negative for abdominal pain, nausea and vomiting.  Neurological:  Positive for weakness (deconditioning).    Objective:   Primary Diagnoses: Present on Admission:  Acute hypoxemic respiratory failure (HCC)   Vital Signs:  BP (!) 119/58   Pulse (!) 104   Temp 97.7 F (36.5 C) (Oral)   Resp (!) 24   Ht 6' 1 (1.854 m)   Wt 70.3 kg   SpO2 91%   BMI 20.45 kg/m   Physical Exam Vitals and nursing note reviewed.  Constitutional:      General: He is not in acute distress.    Appearance: He is ill-appearing. He is not toxic-appearing.     Comments: In the bedside chair  HENT:     Head: Normocephalic and atraumatic.  Cardiovascular:     Rate and Rhythm: Normal rate.  Pulmonary:     Effort: Pulmonary effort is normal. No respiratory distress.  Abdominal:     General: Abdomen is flat. There is no distension.  Skin:    General: Skin is warm and dry.  Neurological:     General: No focal deficit present.     Mental Status: He is alert and oriented to person, place, and time.  Psychiatric:        Mood and Affect: Mood normal.        Behavior: Behavior normal.     Palliative Assessment/Data: 50%   Existing Vynca/ACP Documentation: MOST form signed 05/19/2022   Assessment & Plan:   HPI/Patient Profile:   79 y.o. male  with past medical history of A-fib on chronic 8/8, PAD with chronic LLE nonhealing  wound, hypertension, hyperlipidemia, aortic stenosis, previous CVA previous prostate cancer, previous LLE arterial occlusion with critical limb ischemia 10/25 status post thrombectomy, previous GI bleed secondary to duodenal ulcer 12/25, previous perforated small bowel status post Arlyss patch repair and lengthy TPN.  He presented from rehab after being found in respiratory distress.  After ER evaluation he was admitted on 06/16/2024 with AHRF secondary to HAP versus aspiration with component of pulmonary edema from CHF, mixed shock (septic and cardiogenic component), acute HFrEF, AMS, A-fib with RVR, AKI, acute on chronic anemia, and others.    Palliative medicine was consulted for GOC conversations.   06/28/2024: Patient appears well today, sitting in a bedside chair.  However, he  does appear frail and weak.  We had a good conversation about possible discharge dispositions including CIR, SNF/rehab, etc.  At this point after conversation he is agreeable to evaluation for CIR admission to support improvement quicker.  CIR coordinator was messaged with update.  Will  SUMMARY OF RECOMMENDATIONS   DNR-Limited Continue full scope of care otherwise CIR admission evaluation ongoing Goals are clear at this time Palliative medicine will back off, I will check back to 09/16/2024 when back on service Please notify us  if significant clinical change or new palliative needs in the interim  Symptom Management:  Per primary team Palliative medicine is available to assist as needed  Code Status: DNR - Limited (DNR/DNI)  Prognosis: > 12 months  Discharge Planning: CIR (evaluation pending)  Discussed with: Patient, family, medical team, nursing team, CIR team  Thank you for allowing us  to participate in the care of EDMON MAGID PMT will continue to support holistically.  Time Total: 50 min  Detailed review of medical records (labs, imaging, vital signs), medically appropriate exam, discussed with  treatment team, counseling and education to patient, family, & staff, documenting clinical information, medication management, coordination of care  Camellia Kays, NP Palliative Medicine Team  Team Phone # [phone] (Nights/Weekends)  01/26/2021, 8:17 AM  "

## 2024-06-29 DIAGNOSIS — J9601 Acute respiratory failure with hypoxia: Secondary | ICD-10-CM | POA: Diagnosis not present

## 2024-06-29 LAB — GLUCOSE, CAPILLARY
Glucose-Capillary: 128 mg/dL — ABNORMAL HIGH (ref 70–99)
Glucose-Capillary: 198 mg/dL — ABNORMAL HIGH (ref 70–99)
Glucose-Capillary: 109 mg/dL — ABNORMAL HIGH (ref 70–99)
Glucose-Capillary: 142 mg/dL — ABNORMAL HIGH (ref 70–99)

## 2024-06-29 LAB — CULTURE, BLOOD (ROUTINE X 2)
Culture: NO GROWTH
Special Requests: ADEQUATE

## 2024-06-29 LAB — CBC
HCT: 27.4 % — ABNORMAL LOW (ref 39.0–52.0)
Hemoglobin: 8.1 g/dL — ABNORMAL LOW (ref 13.0–17.0)
MCH: 22.4 pg — ABNORMAL LOW (ref 26.0–34.0)
MCHC: 29.6 g/dL — ABNORMAL LOW (ref 30.0–36.0)
MCV: 75.9 fL — ABNORMAL LOW (ref 80.0–100.0)
Platelets: 421 10*3/uL — ABNORMAL HIGH (ref 150–400)
RBC: 3.61 MIL/uL — ABNORMAL LOW (ref 4.22–5.81)
RDW: 20.7 % — ABNORMAL HIGH (ref 11.5–15.5)
WBC: 18 10*3/uL — ABNORMAL HIGH (ref 4.0–10.5)
nRBC: 0 % (ref 0.0–0.2)

## 2024-06-29 LAB — MAGNESIUM: Magnesium: 2.2 mg/dL (ref 1.7–2.4)

## 2024-06-29 MED ORDER — DOXYCYCLINE HYCLATE 100 MG PO TABS
100.0000 mg | ORAL_TABLET | Freq: Two times a day (BID) | ORAL | Status: DC
  Administered 2024-06-29 – 2024-06-30 (×4): 100 mg via ORAL
  Filled 2024-06-29 (×4): qty 1

## 2024-06-29 MED ORDER — AMOXICILLIN-POT CLAVULANATE 875-125 MG PO TABS
1.0000 | ORAL_TABLET | Freq: Two times a day (BID) | ORAL | Status: DC
  Administered 2024-06-29 – 2024-06-30 (×4): 1 via ORAL
  Filled 2024-06-29 (×4): qty 1

## 2024-06-29 NOTE — Plan of Care (Signed)
  Problem: Education: Goal: Ability to describe self-care measures that may prevent or decrease complications (Diabetes Survival Skills Education) will improve Outcome: Progressing Goal: Individualized Educational Video(s) Outcome: Progressing   Problem: Coping: Goal: Ability to adjust to condition or change in health will improve Outcome: Progressing   Problem: Fluid Volume: Goal: Ability to maintain a balanced intake and output will improve Outcome: Progressing   Problem: Health Behavior/Discharge Planning: Goal: Ability to identify and utilize available resources and services will improve Outcome: Progressing Goal: Ability to manage health-related needs will improve Outcome: Progressing   Problem: Metabolic: Goal: Ability to maintain appropriate glucose levels will improve Outcome: Progressing   Problem: Nutritional: Goal: Maintenance of adequate nutrition will improve Outcome: Progressing Goal: Progress toward achieving an optimal weight will improve Outcome: Progressing   Problem: Skin Integrity: Goal: Risk for impaired skin integrity will decrease Outcome: Progressing   Problem: Tissue Perfusion: Goal: Adequacy of tissue perfusion will improve Outcome: Progressing   Problem: Activity: Goal: Ability to tolerate increased activity will improve Outcome: Progressing   Problem: Respiratory: Goal: Ability to maintain a clear airway and adequate ventilation will improve Outcome: Progressing   Problem: Role Relationship: Goal: Method of communication will improve Outcome: Progressing   Problem: Education: Goal: Knowledge of General Education information will improve Description: Including pain rating scale, medication(s)/side effects and non-pharmacologic comfort measures Outcome: Progressing   Problem: Health Behavior/Discharge Planning: Goal: Ability to manage health-related needs will improve Outcome: Progressing   Problem: Clinical Measurements: Goal:  Ability to maintain clinical measurements within normal limits will improve Outcome: Progressing Goal: Will remain free from infection Outcome: Progressing Goal: Diagnostic test results will improve Outcome: Progressing Goal: Respiratory complications will improve Outcome: Progressing Goal: Cardiovascular complication will be avoided Outcome: Progressing   Problem: Activity: Goal: Risk for activity intolerance will decrease Outcome: Progressing   Problem: Nutrition: Goal: Adequate nutrition will be maintained Outcome: Progressing   Problem: Coping: Goal: Level of anxiety will decrease Outcome: Progressing   Problem: Elimination: Goal: Will not experience complications related to bowel motility Outcome: Progressing Goal: Will not experience complications related to urinary retention Outcome: Progressing   Problem: Pain Managment: Goal: General experience of comfort will improve and/or be controlled Outcome: Progressing   Problem: Safety: Goal: Ability to remain free from injury will improve Outcome: Progressing   Problem: Skin Integrity: Goal: Risk for impaired skin integrity will decrease Outcome: Progressing   Problem: Safety: Goal: Non-violent Restraint(s) Outcome: Progressing

## 2024-06-29 NOTE — Progress Notes (Signed)
 Overnight Coverage Note   Patient refused DNR armband, stating that he wants to be full code. His wife at the bedside supports his decision. Code status was changed back to Full.

## 2024-06-29 NOTE — TOC Progression Note (Signed)
 Transition of Care [facility]) - Progression Note    Patient Details  Name: Kyle Kelley MRN: [id number] Date of Birth: 05/02/1946  [facility] of [facility] CM/SW Contact  Montie LOISE Louder, [place] Phone Number: 06/29/2024, 2:46 PM  Clinical Narrative:    CSW spoke with patient's spouse,[name] by phone- CSW introduced self and explained role. Patient and spouse confirmed, patient came from [facility] and is expected to return once stable.   [facility] updated- pt is expected to return.   TOC will continue to follow and assist with discharge planning once stable.   Montie Louder, MSW, LCSW Clinical Social Worker    Expected Discharge Plan: Skilled Nursing Facility Barriers to Discharge: Continued Medical Work up               Expected Discharge Plan and Services In-house Referral: Clinical Social Work     Living arrangements for the past 2 months: Single Family Home                                       Social Drivers of Health (SDOH) Interventions SDOH Screenings   Food Insecurity: No Food Insecurity (06/17/2024)  Housing: Low Risk (06/17/2024)  Transportation Needs: No Transportation Needs (06/17/2024)  Utilities: Not At Risk (06/17/2024)  Social Connections: Moderately Integrated (06/17/2024)  Tobacco Use: Medium Risk (06/26/2024)    Readmission Risk Interventions    05/06/2024   12:43 PM  Readmission Risk Prevention Plan  Transportation Screening Complete  PCP or Specialist Appt within 3-5 Days Complete  HRI or Home Care Consult Complete  Social Work Consult for Recovery Care Planning/Counseling Complete  Palliative Care Screening Complete  Medication Review Oceanographer) Referral to Pharmacy

## 2024-06-29 NOTE — Progress Notes (Signed)
 [facility] PROGRESS NOTE    Progress Note  Kyle Kelley  FMW:[id number] DOB: 02-05-1946 DOA: 06/16/2024 PCP: Charlott Dorn LABOR, MD     Brief Narrative:   Kyle Kelley is an 79 y.o. male past medical history of chronic atrial fibrillation on anticoagulation, aortic stenosis, history of perforated duodenal ulcer with a Midwife, with complications of wound dehiscence and wound VAC placement who was sent to skilled nursing facility, was brought in by EMS for respiratory distress EMS found him unresponsive wheezing, also in A-fib with RVR on arrival was emergently intubated for airway protection unresponsive to fluid and started on pressors mated to the ICU  Significant Events: Admitted to ICU 06/16/24 1/19 Intubated, sedated, Afib RVR, on pressors  1/20 Pressor requirements improving, Afib 120s-130s, now following commands, still no BM- SMOG enema given. Stress dose steroids and flagyl  stopped 1/21 remains in A-fib but rate controlled.  Failed SBT.  Culture data still negative.  Added dobutamine  1/22 awake with stable respiratory status post extubation 1/23 remains on dobutamine  and HFNC, off pressors. Thora on left- 1200cc removed, not fully evacuated.  1/26 Remains on dobutamine  gtt at , Coox 67.3, continues to diurese with lasix     Assessment/Plan:   Acute hypoxemic respiratory failure (HCC) secondary to healthcare associated pneumonia with a component of pulmonary edema from heart failure: Initially required fluid resuscitation, pressors and IV fluids. He completed 7-day course of antibiotics. He was weaned off pressors. Started on IV diuresis, eventually extubated. Now satting greater 92% on room air. Family agreed for the patient to be DNR/DNI.  He will have a lengthy and rocky recovery. Palliative care was consulted due to his severe fragility. With new oxygen requirements he denies any cough with eating or drinking. Lungs are clear to  auscultation.,  Out of bed to chair encourage incentive spirometry continue work with physical therapy.  Mixed picture initially septic shock now cardiogenic component: Advanced heart failure team was consulted started on dobutamine , Aldactone  and  Lasix  drip. Started on midodrine  10 mg 3 times a day for pressure support.    Paroxysmal atrial fibrillation: Initially started on amiodarone  drip and heparin . Transition to Eliquis  and digoxin . He remains rate controlled.  Leukocytosis/1 out of 2 blood cultures positive for PEPTOSTREPTOCOCCUS  ASACCHAROLYTICUS Abnormal : Currently on IV Rocephin .  Leukocytosis continues to rise he has remained afebrile. Discussed with ID, likely a contaminant. Leukocytosis resolving, continues to remain afebrile. Surveillance blood cultures have been negative till date. No further workup.  Acute kidney injury: Likely due to sepsis. With a baseline creatinine of less than 1, peaked at 1.4 now improved to baseline.  Bilateral pleural effusion: Status post thoracocentesis on 06/21/2024.  Then again on 06/26/2023.  Acute on chronic microcytic anemia/with a history of GI bleed secondary to a perforated duodenal ulcer: Hemo-globin is stable at around 7.9. MCV is low RDW is high check an anemia panel.  History of severe PAD with a recent lower extremity DVT status post thrombectomy on the left/acute ischemic leg in October 2025 status post left anterior peritoneal endarterectomy with vein patch and stenting: Lower extremity Doppler was negative. Remains on Eliquis  and Plavix . Continue wound care.  Essential hypertension/hyperlipidemia: Antihypertensive medications are on hold he is currently on midodrine  due to borderline blood pressure. Continue statins.  Remote history of ICH: Continue to monitor neurological status while on anticoagulation.  Hyperglycemia: Relatively well-controlled on Jardiance and sliding scale  insulin .  Hypokalemia/hypomagnesemia: Try to keep potassium greater than 4 magnesium   greater than 2. Potassium is 3.5 replete orally recheck in the morning.  Severe protein caloric malnutrition: Continue regular diet.  Acute confusional state: Now improved.  Severe deconditioning/severe protein caloric malnutrition: PT OT has been consulted, will need skilled nursing facility placement. Out of bed to chair.  Unstageable sacral decubitus ulcer present on admission: With increased odor but has remained afebrile. Antibiotics postoperatively leukocytosis improved. Wound culture related to wound, will continue daily dressing changes with chemical debridement. Will go ahead and start empirically on doxycycline  and Augmentin .  RN Pressure Injury Documentation: Wound 06/16/24 1900 Pressure Injury Sacrum Mid Unstageable - Full thickness tissue loss in which the base of the injury is covered by slough (yellow, tan, gray, green or brown) and/or eschar (tan, brown or black) in the wound bed. (Active)     Wound 06/26/24 1041 Pressure Injury Thigh Posterior;Proximal;Right Deep Tissue Pressure Injury - Purple or maroon localized area of discolored intact skin or blood-filled blister due to damage of underlying soft tissue from pressure and/or shear. (Active)     DVT prophylaxis: Eliquis  Family Communication:wife Status is: Inpatient Remains inpatient appropriate because: Septic shock    Code Status:     Code Status Orders  (From admission, onward)           Start     Ordered   06/17/24 1212  Do not attempt resuscitation (DNR)- Limited -Do Not Intubate (DNI)  (Code Status)  Continuous       Question Answer Comment  If pulseless and not breathing No CPR or chest compressions.   In Pre-Arrest Conditions (Patient Is Breathing and Has A Pulse) Do not intubate. Provide all appropriate non-invasive medical interventions. Avoid ICU transfer unless indicated or required.   Consent:  Discussion documented in EHR or advanced directives reviewed      06/17/24 1212           Code Status History     Date Active Date Inactive Code Status Order ID Comments User Context   06/16/2024 2022 06/17/2024 1212 Full Code [id number]  Layman Raisin, DO ED   05/03/2024 1251 05/20/2024 1722 Full Code [id number]  Willette Adriana LABOR, MD Inpatient   03/26/2024 0835 03/28/2024 1733 Full Code [id number]  Modesto Lucie PARAS, PA-C Inpatient   03/26/2024 0558 03/26/2024 0835 Full Code [id number]  Franky Redia SAILOR, MD Inpatient   02/28/2024 0235 02/29/2024 2105 Full Code [id number]  Sallyann Normie HERO, MD ED   09/02/2020 2031 09/03/2020 2034 Full Code [id number]  Hilma Rankins, MD Inpatient      Advance Directive Documentation    Flowsheet Row Most Recent Value  Type of Advance Directive Healthcare Power of Attorney  Pre-existing out of facility DNR order (yellow form or pink MOST form) --  MOST Form in Place? --      IV Access:   Peripheral IV   Procedures and diagnostic studies:   No results found.    Medical Consultants:   None.   Subjective:    Kyle Kelley tolerating his diet.  Objective:    Vitals:   06/28/24 2007 06/29/24 0023 06/29/24 0257 06/29/24 0721  BP: 129/69 123/65 125/72   Pulse: 95 91 99 98  Resp: 19 18 20 19   Temp: 98.5 F (36.9 C) 97.8 F (36.6 C) 97.9 F (36.6 C)   TempSrc: Oral Oral Oral   SpO2: 90% (!) 88% 90% (!) 87%  Weight:   69.9 kg   Height:       SpO2: (!) 87 % O2 Flow  Rate (L/min): (S) 2 L/min FiO2 (%): 45 %   Intake/Output Summary (Last 24 hours) at 06/29/2024 0742 Last data filed at 06/29/2024 0400 Gross per 24 hour  Intake 477 ml  Output 2100 ml  Net -1623 ml   Filed Weights   06/27/24 0527 06/28/24 0458 06/29/24 0257  Weight: 65.6 kg 70.3 kg 69.9 kg    Exam: General exam: In no acute distress. Respiratory system: Good air movement and clear to auscultation. Cardiovascular system: S1 & S2 heard, RRR. No  JVD. Gastrointestinal system: Abdomen is nondistended, soft and nontender.  Extremities: No pedal edema. Skin: 2- decubitus ulcer malodorous Psychiatry: Judgement and insight appear normal. Mood & affect appropriate.  Data Reviewed:    Labs: Basic Metabolic Panel: Recent Labs  Lab 06/24/24 1658 06/25/24 0502 06/26/24 0507 06/27/24 0411 06/28/24 0400 06/29/24 0355  NA 132* 133* 133* 131* 134*  --   K 4.0 3.4* 3.5 3.5 4.1  --   CL 85* 87* 91* 91* 94*  --   CO2 41* 42* 37* 35* 32  --   GLUCOSE 186* 100* 91 102* 98  --   BUN 29* 27* 31* 34* 37*  --   CREATININE 0.78 0.71 0.77 0.79 0.77  --   CALCIUM  7.9* 8.0* 8.1* 8.2* 8.1*  --   MG  --  2.2 2.0 2.0 1.9 2.2   GFR Estimated Creatinine Clearance: 77.7 mL/min (by C-G formula based on SCr of 0.77 mg/dL). Liver Function Tests: No results for input(s): AST, ALT, ALKPHOS, BILITOT, PROT, ALBUMIN  in the last 168 hours.  No results for input(s): LIPASE, AMYLASE in the last 168 hours. No results for input(s): AMMONIA in the last 168 hours. Coagulation profile No results for input(s): INR, PROTIME in the last 168 hours. COVID-19 Labs  Recent Labs    06/27/24 0841  FERRITIN 53    Lab Results  Component Value Date   SARSCOV2NAA NEGATIVE 06/16/2024   SARSCOV2NAA NEGATIVE 09/02/2020    CBC: Recent Labs  Lab 06/24/24 0400 06/25/24 0502 06/26/24 0507 06/27/24 0411 06/28/24 0400  WBC 12.4* 17.7* 20.4* 22.0* 18.0*  HGB 7.8* 7.8* 7.8* 7.9* 7.7*  HCT 26.8* 26.0* 26.0* 26.0* 25.5*  MCV 78.4* 77.2* 76.2* 75.6* 75.0*  PLT 296 312 358 390 404*   Cardiac Enzymes: No results for input(s): CKTOTAL, CKMB, CKMBINDEX, TROPONINI in the last 168 hours. BNP (last 3 results) Recent Labs    06/20/24 0346 06/21/24 0336 06/22/24 0502  PROBNP 6,984.0* 4,584.0* 3,305.0*   CBG: Recent Labs  Lab 06/28/24 0750 06/28/24 1215 06/28/24 1604 06/28/24 2029 06/28/24 2156  GLUCAP 94 152* 100* 151* 171*    D-Dimer: No results for input(s): DDIMER in the last 72 hours. Hgb A1c: No results for input(s): HGBA1C in the last 72 hours. Lipid Profile: No results for input(s): CHOL, HDL, LDLCALC, TRIG, CHOLHDL, LDLDIRECT in the last 72 hours. Thyroid  function studies: No results for input(s): TSH, T4TOTAL, T3FREE, THYROIDAB in the last 72 hours.  Invalid input(s): FREET3 Anemia work up: Recent Labs    06/27/24 0841  VITAMINB12 1,674*  FOLATE 15.8  FERRITIN 53  TIBC 293  IRON  29*  RETICCTPCT 1.7   Sepsis Labs: Recent Labs  Lab 06/25/24 0502 06/26/24 0507 06/27/24 0411 06/28/24 0400  WBC 17.7* 20.4* 22.0* 18.0*   Microbiology Recent Results (from the past 240 hours)  Body fluid culture w Gram Stain     Status: None   Collection Time: 06/21/24  9:14 PM   Specimen: Pleura; Body Fluid  Result Value Ref Range Status   Specimen Description PLEURAL  Final   Special Requests NONE  Final   Gram Stain   Final    RARE WBC PRESENT, PREDOMINANTLY PMN NO ORGANISMS SEEN    Culture   Final    NO GROWTH 3 DAYS Performed at [facility], [address], [place], [place] [address]    Report Status 06/24/2024 FINAL  Final  Culture, blood (Routine X 2) w Reflex to ID Panel     Status: None (Preliminary result)   Collection Time: 06/24/24 11:38 AM   Specimen: BLOOD  Result Value Ref Range Status   Specimen Description BLOOD SITE NOT SPECIFIED  Final   Special Requests   Final    BOTTLES DRAWN AEROBIC AND ANAEROBIC Blood Culture adequate volume   Culture   Final    NO GROWTH 4 DAYS Performed at [facility], [address] [place], [place] [address]    Report Status PENDING  Incomplete     Medications:    apixaban   5 mg Oral BID   ascorbic acid   500 mg Oral Daily   clopidogrel   75 mg Oral Daily   collagenase    Topical Daily   digoxin   0.0625 mg Oral Daily   famotidine   20 mg Oral QHS   insulin  aspart  0-9 Units Subcutaneous TID AC & HS    lactose free nutrition  237 mL Oral TID BM   midodrine   10 mg Oral TID WC   multivitamin with minerals  1 tablet Oral Daily   polyethylene glycol  17 g Oral BID   senna-docusate  2 tablet Oral QHS   sodium chloride  flush  10-40 mL Intracatheter Q12H   sodium hypochlorite   Irrigation Daily   spironolactone   25 mg Oral Daily   torsemide   40 mg Oral Daily   zinc  sulfate (50mg  elemental zinc )  220 mg Oral Daily   Continuous Infusions:  iron  sucrose (VENOFER ) 500 mg in sodium chloride  0.9 % 250 mL IVPB 500 mg (06/28/24 1423)      LOS: 13 days   Kyle Kelley  [facility]  06/29/2024, 7:42 AM

## 2024-06-30 DIAGNOSIS — J9601 Acute respiratory failure with hypoxia: Secondary | ICD-10-CM | POA: Diagnosis not present

## 2024-06-30 LAB — CBC
HCT: 26.7 % — ABNORMAL LOW (ref 39.0–52.0)
Hemoglobin: 8 g/dL — ABNORMAL LOW (ref 13.0–17.0)
MCH: 22.8 pg — ABNORMAL LOW (ref 26.0–34.0)
MCHC: 30 g/dL (ref 30.0–36.0)
MCV: 76.1 fL — ABNORMAL LOW (ref 80.0–100.0)
Platelets: 412 10*3/uL — ABNORMAL HIGH (ref 150–400)
RBC: 3.51 MIL/uL — ABNORMAL LOW (ref 4.22–5.81)
RDW: 21.4 % — ABNORMAL HIGH (ref 11.5–15.5)
WBC: 18.8 10*3/uL — ABNORMAL HIGH (ref 4.0–10.5)
nRBC: 0.1 % (ref 0.0–0.2)

## 2024-06-30 LAB — CULTURE, BLOOD (ROUTINE X 2)
Culture  Setup Time: NONE SEEN
Culture: NO GROWTH

## 2024-06-30 LAB — GLUCOSE, CAPILLARY
Glucose-Capillary: 173 mg/dL — ABNORMAL HIGH (ref 70–99)
Glucose-Capillary: 204 mg/dL — ABNORMAL HIGH (ref 70–99)
Glucose-Capillary: 103 mg/dL — ABNORMAL HIGH (ref 70–99)
Glucose-Capillary: 115 mg/dL — ABNORMAL HIGH (ref 70–99)

## 2024-06-30 LAB — MAGNESIUM: Magnesium: 2 mg/dL (ref 1.7–2.4)

## 2024-06-30 MED ORDER — ZINC SULFATE 220 (50 ZN) MG PO CAPS: 220.0000 mg | ORAL_CAPSULE | Freq: Every day | ORAL | Status: AC

## 2024-06-30 MED ORDER — AMOXICILLIN-POT CLAVULANATE 875-125 MG PO TABS: 1.0000 | ORAL_TABLET | Freq: Two times a day (BID) | ORAL | Status: DC

## 2024-06-30 MED ORDER — ADULT MULTIVITAMIN W/MINERALS CH: 1.0000 | ORAL_TABLET | Freq: Every day | ORAL | Status: AC

## 2024-06-30 MED ORDER — SPIRONOLACTONE 25 MG PO TABS: 25.0000 mg | ORAL_TABLET | Freq: Every day | ORAL | Status: AC

## 2024-06-30 MED ORDER — DOXYCYCLINE HYCLATE 100 MG PO TABS: 100.0000 mg | ORAL_TABLET | Freq: Two times a day (BID) | ORAL | Status: DC

## 2024-06-30 MED ORDER — DAKINS (1/4 STRENGTH) 0.125 % EX SOLN: Freq: Every day | CUTANEOUS | Status: AC

## 2024-06-30 MED ORDER — MIDODRINE HCL 10 MG PO TABS: 10.0000 mg | ORAL_TABLET | Freq: Three times a day (TID) | ORAL | Status: AC

## 2024-06-30 MED ORDER — DIGOXIN 62.5 MCG PO TABS: 0.0625 mg | ORAL_TABLET | Freq: Every day | ORAL | Status: AC

## 2024-06-30 NOTE — Progress Notes (Signed)
 [facility] PROGRESS NOTE    Progress Note  Kyle Kelley  FMW:[id number] DOB: 1946/01/12 DOA: 06/16/2024 PCP: Charlott Dorn LABOR, MD     Brief Narrative:   Kyle Kelley is an 79 y.o. male past medical history of chronic atrial fibrillation on anticoagulation, aortic stenosis, history of perforated duodenal ulcer with a Midwife, with complications of wound dehiscence and wound VAC placement who was sent to skilled nursing facility, was brought in by EMS for respiratory distress EMS found him unresponsive wheezing, also in A-fib with RVR on arrival was emergently intubated for airway protection unresponsive to fluid and started on pressors mated to the ICU  Significant Events: Admitted to ICU 06/16/24 1/19 Intubated, sedated, Afib RVR, on pressors  1/20 Pressor requirements improving, Afib 120s-130s, now following commands, still no BM- SMOG enema given. Stress dose steroids and flagyl  stopped 1/21 remains in A-fib but rate controlled.  Failed SBT.  Culture data still negative.  Added dobutamine  1/22 awake with stable respiratory status post extubation 1/23 remains on dobutamine  and HFNC, off pressors. Thora on left- 1200cc removed, not fully evacuated.  1/26 Remains on dobutamine  gtt at , Coox 67.3, continues to diurese with lasix     Assessment/Plan:   Acute hypoxemic respiratory failure (HCC) secondary to healthcare associated pneumonia with a component of pulmonary edema from heart failure: Initially required fluid resuscitation, pressors and IV fluids. He completed 7-day course of antibiotics. He was weaned off pressors. Started on IV diuresis, eventually extubated. Now satting greater 92% on room air. Family agreed for the patient to be DNR/DNI.  He will have a lengthy and rocky recovery. Palliative care was consulted due to his severe fragility. With new oxygen requirements he denies any cough with eating or drinking. Lungs are clear to  auscultation.,  Out of bed to chair encourage incentive spirometry continue work with physical therapy. Patient and wife opted for skilled nursing setting.  Mixed picture initially septic shock now cardiogenic component: Advanced heart failure team was consulted started on dobutamine , Aldactone  and  Lasix  drip. Started on midodrine  10 mg 3 times a day for pressure support.    Paroxysmal atrial fibrillation: Initially started on amiodarone  drip and heparin . Transition to Eliquis  and digoxin . He remains rate controlled.  Leukocytosis/1 out of 2 blood cultures positive for PEPTOSTREPTOCOCCUS  ASACCHAROLYTICUS Abnormal : Currently on IV Rocephin .  Leukocytosis continues to rise he has remained afebrile. Discussed with ID, likely a contaminant. Leukocytosis resolving, continues to remain afebrile. Surveillance blood cultures have been negative till date. No further workup.  Acute kidney injury: Likely due to sepsis. With a baseline creatinine of less than 1, peaked at 1.4 now improved to baseline.  Bilateral pleural effusion: Status post thoracocentesis on 06/21/2024.  Then again on 06/26/2023.  Acute on chronic microcytic anemia/with a history of GI bleed secondary to a perforated duodenal ulcer: Hemo-globin is stable at around 7.9. MCV is low RDW is high check an anemia panel.  History of severe PAD with a recent lower extremity DVT status post thrombectomy on the left/acute ischemic leg in October 2025 status post left anterior peritoneal endarterectomy with vein patch and stenting: Lower extremity Doppler was negative. Remains on Eliquis  and Plavix . Continue wound care.  Essential hypertension/hyperlipidemia: Antihypertensive medications are on hold he is currently on midodrine  due to borderline blood pressure. Continue statins.  Remote history of ICH: Continue to monitor neurological status while on anticoagulation.  Hyperglycemia: Relatively well-controlled on Jardiance and  sliding scale insulin .  Hypokalemia/hypomagnesemia:  Try to keep potassium greater than 4 magnesium  greater than 2. Potassium is 3.5 replete orally recheck in the morning.  Severe protein caloric malnutrition: Continue regular diet.  Acute confusional state: Now improved.  Severe deconditioning/severe protein caloric malnutrition: PT OT has been consulted, will need skilled nursing facility placement. Out of bed to chair.  Unstageable sacral decubitus ulcer present on admission: With increased odor but has remained afebrile. Antibiotics postoperatively leukocytosis improved. Wound culture consulted, will continue daily dressing changes with chemical debridement. Will go ahead and start empirically on doxycycline  and Augmentin . Out of bed to chair is much as possible.  RN Pressure Injury Documentation: Wound 06/16/24 1900 Pressure Injury Sacrum Mid Unstageable - Full thickness tissue loss in which the base of the injury is covered by slough (yellow, tan, gray, green or brown) and/or eschar (tan, brown or black) in the wound bed. (Active)     Wound 06/26/24 1041 Pressure Injury Thigh Posterior;Proximal;Right Deep Tissue Pressure Injury - Purple or maroon localized area of discolored intact skin or blood-filled blister due to damage of underlying soft tissue from pressure and/or shear. (Active)     DVT prophylaxis: Eliquis  Family Communication:wife Status is: Inpatient Remains inpatient appropriate because: Septic shock    Code Status:     Code Status Orders  (From admission, onward)           Start     Ordered   06/17/24 1212  Do not attempt resuscitation (DNR)- Limited -Do Not Intubate (DNI)  (Code Status)  Continuous       Question Answer Comment  If pulseless and not breathing No CPR or chest compressions.   In Pre-Arrest Conditions (Patient Is Breathing and Has A Pulse) Do not intubate. Provide all appropriate non-invasive medical interventions. Avoid ICU transfer  unless indicated or required.   Consent: Discussion documented in EHR or advanced directives reviewed      06/17/24 1212           Code Status History     Date Active Date Inactive Code Status Order ID Comments User Context   06/16/2024 2022 06/17/2024 1212 Full Code [id number]  Layman Raisin, DO ED   05/03/2024 1251 05/20/2024 1722 Full Code [id number]  Willette Adriana LABOR, MD Inpatient   03/26/2024 0835 03/28/2024 1733 Full Code [id number]  [place] Lucie PARAS, PA-C Inpatient   03/26/2024 0558 03/26/2024 0835 Full Code [id number]  Franky Redia SAILOR, MD Inpatient   02/28/2024 0235 02/29/2024 2105 Full Code [id number]  Sallyann Normie HERO, MD ED   09/02/2020 2031 09/03/2020 2034 Full Code [id number]  Hilma Rankins, MD Inpatient      Advance Directive Documentation    Flowsheet Row Most Recent Value  Type of Advance Directive Healthcare Power of Attorney  Pre-existing out of facility DNR order (yellow form or pink MOST form) --  MOST Form in Place? --      IV Access:   Peripheral IV   Procedures and diagnostic studies:   No results found.    Medical Consultants:   None.   Subjective:    Kyle Kelley tolerating his diet.  Objective:    Vitals:   06/29/24 2023 06/29/24 2300 06/30/24 0424 06/30/24 0853  BP: 118/68 138/69 (!) 122/56 112/62  Pulse: 93 97 81 100  Resp: 19 20 16  (!) 34  Temp: 98.3 F (36.8 C) 98.5 F (36.9 C) (!) 97.4 F (36.3 C) 97.8 F (36.6 C)  TempSrc: Oral Oral Axillary Oral  SpO2: 92% 93% 94% 94%  Weight:  Height:       SpO2: 94 % O2 Flow Rate (L/min): 2 L/min FiO2 (%): 45 %   Intake/Output Summary (Last 24 hours) at 06/30/2024 0959 Last data filed at 06/30/2024 0500 Gross per 24 hour  Intake 840 ml  Output 2250 ml  Net -1410 ml   Filed Weights   06/27/24 0527 06/28/24 0458 06/29/24 0257  Weight: 65.6 kg 70.3 kg 69.9 kg    Exam: General exam: In no acute distress. Respiratory system: Good air movement and clear to  auscultation. Cardiovascular system: S1 & S2 heard, RRR. No JVD. Gastrointestinal system: Abdomen is nondistended, soft and nontender.  Extremities: No pedal edema. Skin: 2- decubitus ulcer malodorous Psychiatry: Judgement and insight appear normal. Mood & affect appropriate.  Data Reviewed:    Labs: Basic Metabolic Panel: Recent Labs  Lab 06/24/24 1658 06/25/24 0502 06/26/24 0507 06/27/24 0411 06/28/24 0400 06/29/24 0355 06/30/24 0224  NA 132* 133* 133* 131* 134*  --   --   K 4.0 3.4* 3.5 3.5 4.1  --   --   CL 85* 87* 91* 91* 94*  --   --   CO2 41* 42* 37* 35* 32  --   --   GLUCOSE 186* 100* 91 102* 98  --   --   BUN 29* 27* 31* 34* 37*  --   --   CREATININE 0.78 0.71 0.77 0.79 0.77  --   --   CALCIUM  7.9* 8.0* 8.1* 8.2* 8.1*  --   --   MG  --  2.2 2.0 2.0 1.9 2.2 2.0   GFR Estimated Creatinine Clearance: 77.7 mL/min (by C-G formula based on SCr of 0.77 mg/dL). Liver Function Tests: No results for input(s): AST, ALT, ALKPHOS, BILITOT, PROT, ALBUMIN  in the last 168 hours.  No results for input(s): LIPASE, AMYLASE in the last 168 hours. No results for input(s): AMMONIA in the last 168 hours. Coagulation profile No results for input(s): INR, PROTIME in the last 168 hours. COVID-19 Labs  No results for input(s): DDIMER, FERRITIN, LDH, CRP in the last 72 hours.   Lab Results  Component Value Date   SARSCOV2NAA NEGATIVE 06/16/2024   SARSCOV2NAA NEGATIVE 09/02/2020    CBC: Recent Labs  Lab 06/26/24 0507 06/27/24 0411 06/28/24 0400 06/29/24 0930 06/30/24 0224  WBC 20.4* 22.0* 18.0* 18.0* 18.8*  HGB 7.8* 7.9* 7.7* 8.1* 8.0*  HCT 26.0* 26.0* 25.5* 27.4* 26.7*  MCV 76.2* 75.6* 75.0* 75.9* 76.1*  PLT 358 390 404* 421* 412*   Cardiac Enzymes: No results for input(s): CKTOTAL, CKMB, CKMBINDEX, TROPONINI in the last 168 hours. BNP (last 3 results) Recent Labs    06/20/24 0346 06/21/24 0336 06/22/24 0502  PROBNP 6,984.0*  4,584.0* 3,305.0*   CBG: Recent Labs  Lab 06/29/24 0746 06/29/24 1207 06/29/24 1642 06/29/24 2157 06/30/24 0857  GLUCAP 128* 198* 109* 142* 173*   D-Dimer: No results for input(s): DDIMER in the last 72 hours. Hgb A1c: No results for input(s): HGBA1C in the last 72 hours. Lipid Profile: No results for input(s): CHOL, HDL, LDLCALC, TRIG, CHOLHDL, LDLDIRECT in the last 72 hours. Thyroid  function studies: No results for input(s): TSH, T4TOTAL, T3FREE, THYROIDAB in the last 72 hours.  Invalid input(s): FREET3 Anemia work up: No results for input(s): VITAMINB12, FOLATE, FERRITIN, TIBC, IRON , RETICCTPCT in the last 72 hours.  Sepsis Labs: Recent Labs  Lab 06/27/24 0411 06/28/24 0400 06/29/24 0930 06/30/24 0224  WBC 22.0* 18.0* 18.0* 18.8*   Microbiology Recent Results (from the past  240 hours)  Body fluid culture w Gram Stain     Status: None   Collection Time: 06/21/24  9:14 PM   Specimen: Pleura; Body Fluid  Result Value Ref Range Status   Specimen Description PLEURAL  Final   Special Requests NONE  Final   Gram Stain   Final    RARE WBC PRESENT, PREDOMINANTLY PMN NO ORGANISMS SEEN    Culture   Final    NO GROWTH 3 DAYS Performed at [facility], [address] [place], [place] [address]    Report Status 06/24/2024 FINAL  Final  Culture, blood (Routine X 2) w Reflex to ID Panel     Status: None   Collection Time: 06/24/24 11:38 AM   Specimen: BLOOD  Result Value Ref Range Status   Specimen Description BLOOD SITE NOT SPECIFIED  Final   Special Requests   Final    BOTTLES DRAWN AEROBIC AND ANAEROBIC Blood Culture adequate volume   Culture   Final    NO GROWTH 5 DAYS Performed at [facility], [address], [place], [place] [address]    Report Status 06/29/2024 FINAL  Final     Medications:    amoxicillin -clavulanate  1 tablet Oral Q12H   apixaban   5 mg Oral BID   ascorbic acid   500 mg Oral Daily    clopidogrel   75 mg Oral Daily   digoxin   0.0625 mg Oral Daily   doxycycline   100 mg Oral Q12H   famotidine   20 mg Oral QHS   insulin  aspart  0-9 Units Subcutaneous TID AC & HS   lactose free nutrition  237 mL Oral TID BM   midodrine   10 mg Oral TID WC   multivitamin with minerals  1 tablet Oral Daily   polyethylene glycol  17 g Oral BID   senna-docusate  2 tablet Oral QHS   sodium chloride  flush  10-40 mL Intracatheter Q12H   sodium hypochlorite   Irrigation Daily   spironolactone   25 mg Oral Daily   torsemide   40 mg Oral Daily   zinc  sulfate (50mg  elemental zinc )  220 mg Oral Daily   Continuous Infusions:      LOS: 14 days   Kyle Kelley  [facility]  06/30/2024, 9:59 AM

## 2024-06-30 NOTE — TOC Progression Note (Addendum)
 Transition of Care [facility]) - Progression Note    Patient Details  Name: Kyle Kelley MRN: [id number] Date of Birth: 1945-09-12  Transition of Care [facility]) CM/SW Contact  Olam FORBES Ally, LCSW Phone Number: 06/30/2024, 10:24 AM  Clinical Narrative:     CWS called Randine at [facility] to ascertain if they could accept patient back today. CSW had to leave voice mail.  1:42 pm- CSW called Randine at [facility] again to ascertain if they could accept patient back today. CSW had to leave voice mail.  3:19 pm- CSW spoke with Randine reports no rooms today and patient can return tomorrow.  ICM team will continue to assist with discharge planning needs.   Expected Discharge Plan: Skilled Nursing Facility Barriers to Discharge: Continued Medical Work up               Expected Discharge Plan and Services In-house Referral: Clinical Social Work     Living arrangements for the past 2 months: Single Family Home Expected Discharge Date: 06/30/24                                     Social Drivers of Health (SDOH) Interventions SDOH Screenings   Food Insecurity: No Food Insecurity (06/17/2024)  Housing: Low Risk (06/17/2024)  Transportation Needs: No Transportation Needs (06/17/2024)  Utilities: Not At Risk (06/17/2024)  Social Connections: Moderately Integrated (06/17/2024)  Tobacco Use: Medium Risk (06/26/2024)    Readmission Risk Interventions    05/06/2024   12:43 PM  Readmission Risk Prevention Plan  Transportation Screening Complete  PCP or Specialist Appt within 3-5 Days Complete  HRI or Home Care Consult Complete  Social Work Consult for Recovery Care Planning/Counseling Complete  Palliative Care Screening Complete  Medication Review Oceanographer) Referral to Pharmacy

## 2024-07-01 LAB — GLUCOSE, CAPILLARY: Glucose-Capillary: 215 mg/dL — ABNORMAL HIGH (ref 70–99)

## 2024-07-01 MED ORDER — DOXYCYCLINE HYCLATE 100 MG PO TABS
100.0000 mg | ORAL_TABLET | Freq: Two times a day (BID) | ORAL | Status: DC
  Administered 2024-07-01: 100 mg via ORAL
  Filled 2024-07-01: qty 1

## 2024-07-01 MED ORDER — AMOXICILLIN-POT CLAVULANATE 875-125 MG PO TABS: 1.0000 | ORAL_TABLET | Freq: Two times a day (BID) | ORAL | Status: AC

## 2024-07-01 MED ORDER — AMOXICILLIN-POT CLAVULANATE 875-125 MG PO TABS
1.0000 | ORAL_TABLET | Freq: Two times a day (BID) | ORAL | Status: DC
  Administered 2024-07-01: 1 via ORAL
  Filled 2024-07-01: qty 1

## 2024-07-01 MED ORDER — TORSEMIDE 20 MG PO TABS: 40.0000 mg | ORAL_TABLET | Freq: Every day | ORAL | 3 refills | Status: AC

## 2024-07-01 MED ORDER — DOXYCYCLINE HYCLATE 100 MG PO TABS: 100.0000 mg | ORAL_TABLET | Freq: Two times a day (BID) | ORAL | Status: AC

## 2024-07-01 NOTE — TOC Transition Note (Addendum)
 Transition of Care [facility]) - Discharge Note   Patient Details  Name: Kyle Kelley MRN: [id number] Date of Birth: [birth year]  Transition of Care [facility]) CM/SW Contact:  Arlana JINNY Nicholaus ISRAEL Phone Number: [phone] 07/01/2024, 10:29 AM   Clinical Narrative:   8:11 AM- HF CSW reached out to [place] at [facility] to confirm bed availability. Randine stated that they are admitting today and confirmed bed availability.   Patient will be readmitted to room 201, the number to call for report is [phone].   DC summary paperwork printed to the unit. Will call PTAR once bedside RN confirms dc summary paperwork on chart and patient is ready.   CSW attempted to call patients spouse but her VM has not been setup.   CSW received a call back and shared dispo plans. Patients spouse agreeable.   10:47 AM- PTAR CALLED.       Barriers to Discharge: Continued Medical Work up   Patient Goals and CMS Choice Patient states their goals for this hospitalization and ongoing recovery are:: SNF   Choice offered to / list presented to : Patient      Discharge Placement                       Discharge Plan and Services Additional resources added to the After Visit Summary for   In-house Referral: Clinical Social Work                                   Social Drivers of Health (SDOH) Interventions SDOH Screenings   Food Insecurity: No Food Insecurity (06/17/2024)  Housing: Low Risk (06/17/2024)  Transportation Needs: No Transportation Needs (06/17/2024)  Utilities: Not At Risk (06/17/2024)  Social Connections: Moderately Integrated (06/17/2024)  Tobacco Use: Medium Risk (06/26/2024)     Readmission Risk Interventions    05/06/2024   12:43 PM  Readmission Risk Prevention Plan  Transportation Screening Complete  PCP or Specialist Appt within 3-5 Days Complete  HRI or Home Care Consult Complete  Social Work Consult for Recovery Care Planning/Counseling Complete  Palliative Care  Screening Complete  Medication Review Oceanographer) Referral to Pharmacy

## 2024-07-01 NOTE — Progress Notes (Signed)
 Inpatient Rehab Admissions Coordinator:   Spoke to pts spouse this AM.  Pt feels like he wouldn't be able to tolerate the intensity of CIR and prefers return to [facility] at this time for slower pace.  I've notified TOC and Dr. Odell Castor and I will sign off.    Reche Lowers, PT, DPT Admissions Coordinator [phone] 07/01/24 8:37 AM

## 2024-07-01 NOTE — Progress Notes (Signed)
 Report called to [facility] LPN at [facility].

## 2025-01-30 ENCOUNTER — Ambulatory Visit: Admitting: Neurology
